# Patient Record
Sex: Male | Born: 1950
Health system: Southern US, Community
[De-identification: ages and names within clinical notes are randomized; demographics above are authoritative.]

## PROBLEM LIST (undated history)

## (undated) DIAGNOSIS — C801 Malignant (primary) neoplasm, unspecified: Secondary | ICD-10-CM

## (undated) DIAGNOSIS — M1990 Unspecified osteoarthritis, unspecified site: Secondary | ICD-10-CM

## (undated) DIAGNOSIS — M109 Gout, unspecified: Secondary | ICD-10-CM

## (undated) DIAGNOSIS — I10 Essential (primary) hypertension: Secondary | ICD-10-CM

## (undated) DIAGNOSIS — D126 Benign neoplasm of colon, unspecified: Secondary | ICD-10-CM

## (undated) DIAGNOSIS — K8020 Calculus of gallbladder without cholecystitis without obstruction: Secondary | ICD-10-CM

## (undated) DIAGNOSIS — I639 Cerebral infarction, unspecified: Secondary | ICD-10-CM

## (undated) DIAGNOSIS — K222 Esophageal obstruction: Secondary | ICD-10-CM

## (undated) DIAGNOSIS — B182 Chronic viral hepatitis C: Secondary | ICD-10-CM

## (undated) DIAGNOSIS — M25569 Pain in unspecified knee: Secondary | ICD-10-CM

## (undated) DIAGNOSIS — M25512 Pain in left shoulder: Secondary | ICD-10-CM

## (undated) DIAGNOSIS — G8929 Other chronic pain: Secondary | ICD-10-CM

## (undated) DIAGNOSIS — B1920 Unspecified viral hepatitis C without hepatic coma: Secondary | ICD-10-CM

## (undated) HISTORY — DX: Essential (primary) hypertension: I10

## (undated) HISTORY — DX: Other chronic pain: G89.29

## (undated) HISTORY — DX: Benign neoplasm of colon, unspecified: D12.6

## (undated) HISTORY — DX: Pain in left shoulder: M25.512

## (undated) HISTORY — DX: Esophageal obstruction: K22.2

## (undated) HISTORY — PX: OTHER SURGICAL HISTORY: SHX169

## (undated) HISTORY — DX: Unspecified osteoarthritis, unspecified site: M19.90

## (undated) HISTORY — DX: Gout, unspecified: M10.9

## (undated) HISTORY — DX: Malignant (primary) neoplasm, unspecified: C80.1

## (undated) HISTORY — DX: Chronic viral hepatitis C: B18.2

## (undated) HISTORY — DX: Calculus of gallbladder without cholecystitis without obstruction: K80.20

## (undated) HISTORY — DX: Cerebral infarction, unspecified: I63.9

## (undated) HISTORY — DX: Pain in unspecified knee: M25.569

## (undated) HISTORY — PX: LEG SURGERY: SHX1003

---

## 2002-09-16 ENCOUNTER — Encounter: Payer: Self-pay | Admitting: Emergency Medicine

## 2002-09-16 ENCOUNTER — Emergency Department (HOSPITAL_COMMUNITY): Admission: EM | Admit: 2002-09-16 | Discharge: 2002-09-16 | Payer: Self-pay | Admitting: Emergency Medicine

## 2003-08-01 ENCOUNTER — Emergency Department (HOSPITAL_COMMUNITY): Admission: EM | Admit: 2003-08-01 | Discharge: 2003-08-01 | Payer: Self-pay | Admitting: Family Medicine

## 2004-06-10 ENCOUNTER — Ambulatory Visit (HOSPITAL_COMMUNITY): Admission: RE | Admit: 2004-06-10 | Discharge: 2004-06-10 | Payer: Self-pay | Admitting: Internal Medicine

## 2009-02-22 LAB — CBC
ALT: 120 U/L — AB (ref 10–40)
Total Bilirubin: 0.4 mg/dL

## 2011-07-22 LAB — COMPREHENSIVE METABOLIC PANEL
AST: 41 U/L
Albumin: 3.6
Alkaline Phosphatase: 89 U/L
BUN: 13 mg/dL (ref 4–21)
Calcium: 9 mg/dL
Chloride: 104 mmol/L
Glucose: 79
PSA: 1.05
Potassium: 4.3 mmol/L
Sodium: 140 mmol/L (ref 137–147)
Total Protein: 7.3 g/dL

## 2011-07-22 LAB — CBC: Hemoglobin: 13.8 g/dL (ref 13.5–17.5)

## 2011-12-16 ENCOUNTER — Ambulatory Visit (INDEPENDENT_AMBULATORY_CARE_PROVIDER_SITE_OTHER): Payer: Medicare Other | Admitting: Urgent Care

## 2011-12-16 ENCOUNTER — Encounter: Payer: Self-pay | Admitting: Urgent Care

## 2011-12-16 VITALS — BP 158/90 | HR 80 | Temp 98.2°F | Ht 73.0 in | Wt 158.2 lb

## 2011-12-16 DIAGNOSIS — K759 Inflammatory liver disease, unspecified: Secondary | ICD-10-CM

## 2011-12-16 DIAGNOSIS — B1920 Unspecified viral hepatitis C without hepatic coma: Secondary | ICD-10-CM

## 2011-12-16 DIAGNOSIS — R630 Anorexia: Secondary | ICD-10-CM | POA: Insufficient documentation

## 2011-12-16 DIAGNOSIS — Z8619 Personal history of other infectious and parasitic diseases: Secondary | ICD-10-CM

## 2011-12-16 DIAGNOSIS — R7989 Other specified abnormal findings of blood chemistry: Secondary | ICD-10-CM

## 2011-12-16 LAB — HEPATIC FUNCTION PANEL
AST: 50 U/L — ABNORMAL HIGH (ref 0–37)
Albumin: 4.4 g/dL (ref 3.5–5.2)
Indirect Bilirubin: 0.5 mg/dL (ref 0.0–0.9)
Total Bilirubin: 0.7 mg/dL (ref 0.3–1.2)
Total Protein: 8.5 g/dL — ABNORMAL HIGH (ref 6.0–8.3)

## 2011-12-16 MED ORDER — PEG 3350-KCL-NA BICARB-NACL 420 G PO SOLR: 4000.0000 mL | ORAL | Status: DC

## 2011-12-16 NOTE — Patient Instructions (Addendum)
1-800-QUIT-NOW for help quitting smoking Colonoscopy & EGD (upper endoscopy) with Dr Jena Gauss You need an ultrasound of your liver Go get your labs today. We will call you with results.

## 2011-12-16 NOTE — Assessment & Plan Note (Addendum)
He has chronic unexplained anorexia & malaise.  No significant weight loss.  Never had a colonoscopy.  Offered colonoscopy & EGD with Dr Jena Gauss for further evaluation to look for PUD, h pylori or occult malignancy.  I have discussed risks & benefits which include, but are not limited to, bleeding, infection, perforation & drug reaction.  The patient agrees with this plan & written consent will be obtained.    1-800-QUIT-NOW for help quitting smoking

## 2011-12-16 NOTE — Assessment & Plan Note (Addendum)
Hx elevated AST/ALT with underlying hx hepatitis C.  Last LFTs 07/2011.  Frequent ETOH use.  Recheck LFTs Abdominal ultrasound

## 2011-12-16 NOTE — Progress Notes (Signed)
Referring Provider: No ref. provider found Primary Care Physician:  FAGAN,ROY, MD Primary Gastroenterologist:  Dr. Rourk  Chief Complaint  Patient presents with  . Hepatitis C  . Colonoscopy  . Anorexia    HPI:  Kevin Dillon is a 61 y.o. male here as a referral from Dr. Fagan for hepatitis C & screening colonoscopy.  He reports he was diagnosed with hepatitis C years ago, but never had treatment.  He gives hx of IV drug use (cocaine) 20 yrs ago.  He has 1 tattoo.  He has hx of multiple sexual partners.  No hx blood transfusion.  His father had cirrhosis & heavy etoh use.  He succumbed to it at age 46.  He has anorexia.  He reports early satiety & barely maintains his weight.  C/o malaise.   Drinks a couple beers 3-4 times per week.  Denies jaundice, rash, pruritis, or fevers.  Denies constipation, diarrhea, rectal bleeding, melena or weight loss.  Denies heartburn, indigestion, nausea, vomiting, dysphagia, or odynophagia.  C/o intermittent tingling & paresthesias in both arms while lying supine.  He has never had a screening colonoscopy or EGD.    Review of his old labs shows most recent LFTs (07/22/11) show an AST of 41.  From 2011-2012, AST has been as high as 136 & ALT 120 with otherwise normal LFTs.  07/22/11 CBC & CMP were otherwise unremarkable.  He has not had recent imaging or his liver.    Past Medical History  Diagnosis Date  . Osteoarthritis   . CVA (cerebral infarction)   . Hypertension   . Chronic hepatitis C   . Chronic left shoulder pain   . Chronic knee pain   . Gout     Past Surgical History  Procedure Date  . Leg surgery     left  . Arm surgery     right/plate in arm    Current Outpatient Prescriptions  Medication Sig Dispense Refill  . atenolol (TENORMIN) 50 MG tablet Take 25 mg by mouth daily.       . cyproheptadine (PERIACTIN) 4 MG tablet Take 4 mg by mouth every 6 (six) hours.       . oxyCODONE (ROXICODONE) 15 MG immediate release tablet Take 15 mg by mouth  every 6 (six) hours as needed.         Allergies as of 12/16/2011  . (No Known Allergies)    Family History:There is no known family history of colorectal carcinoma  Problem Relation Age of Onset  . Cirrhosis Father 46  . Lung cancer Mother 76    History   Social History  . Marital Status: Single    Spouse Name: N/A    Number of Children: 0  . Years of Education: N/A   Occupational History  . disabled    Social History Main Topics  . Smoking status: Current Every Day Smoker -- 0.5 packs/day for 40 years    Types: Cigarettes  . Smokeless tobacco: Not on file  . Alcohol Use: Yes     Comment: 2 beers 3-4 days per week  . Drug Use: Yes     Comment: Hx cocaine yrs ago, Last marijuana 2-3 months ago  . Sexually Active: Not Currently -- Male partner(s)   Other Topics Concern  . Not on file   Social History Narrative   Lives w/ significant other, Patricia Heath    Review of Systems: Gen: see HPI CV: Denies chest pain, angina, palpitations, syncope, orthopnea, PND, peripheral   edema, and claudication. Resp: Denies dyspnea at rest, dyspnea with exercise, cough, sputum, wheezing, coughing up blood, and pleurisy. GI: Denies vomiting blood and fecal incontinence.  GU : Denies urinary burning, blood in urine, urinary frequency, urinary hesitancy, nocturnal urination, and urinary incontinence. MS: Denies joint pain, limitation of movement, and swelling, stiffness, low back pain, extremity pain. Denies muscle weakness, cramps, atrophy.  Derm: Denies rash, itching, dry skin, hives, moles, warts, or unhealing ulcers.  Psych: Denies depression, anxiety, memory loss, suicidal ideation, hallucinations, paranoia, and confusion. Heme: Denies bruising, bleeding, and enlarged lymph nodes. Neuro:  Denies any headaches, dizziness, paresthesias. Endo:  Denies any problems with DM, thyroid, adrenal function.  Physical Exam: BP 158/90  Pulse 80  Temp 98.2 F (36.8 C) (Temporal)  Ht 6'  1" (1.854 m)  Wt 158 lb 3.2 oz (71.759 kg)  BMI 20.87 kg/m2 No LMP for male patient. General:   Alert,  Well-developed,thin, pleasant and cooperative in NAD.  Accompanied by his significant other. Head:  Normocephalic and atraumatic. Eyes:  Sclera clear, no icterus.   Conjunctiva pink. Ears:  Normal auditory acuity. Nose:  No deformity, discharge, or lesions. Mouth:  No deformity or lesions,oropharynx pink & moist. Neck:  Supple; no masses or thyromegaly. Lungs:  Clear throughout to auscultation.   No wheezes, crackles, or rhonchi. No acute distress. Heart:  Regular rate and rhythm; no murmurs, clicks, rubs,  or gallops. Abdomen:  Normal bowel sounds.  No bruits.  Soft, non-tender and non-distended without masses, hepatosplenomegaly or hernias noted.  No guarding or rebound tenderness.   Rectal:  Deferred. Msk:  Symmetrical without gross deformities. Normal posture. Pulses:  Normal pulses noted. Extremities:  No edema. Neurologic:  Alert and oriented x4;  grossly normal neurologically. Skin:  Intact without significant lesions or rashes. Lymph Nodes:  No significant cervical adenopathy. Psych:  Alert and cooperative. Normal mood and affect.;  

## 2011-12-16 NOTE — Progress Notes (Signed)
Faxed to PCP

## 2011-12-16 NOTE — Assessment & Plan Note (Addendum)
Kevin Dillon is a pleasant 61 y.o. male referred here for Hepatitis C.  He reports diagnosis years ago, but never had treatment.  Hx IV drug use & multiple sexual partners are known risk factors.  Ongoing alcohol & marijuana use may interfere with treatment candidacy.    He is advised to avoid ETOH & all street drugs Hepatitis C quant RNA by PCR, genotype If active viremia, will refer to Hepatitis C clinic

## 2011-12-18 LAB — HEPATITIS C RNA QUANTITATIVE: HCV Quantitative: 57944 IU/mL — ABNORMAL HIGH (ref <15)

## 2011-12-20 DIAGNOSIS — D126 Benign neoplasm of colon, unspecified: Secondary | ICD-10-CM

## 2011-12-20 HISTORY — DX: Benign neoplasm of colon, unspecified: D12.6

## 2011-12-21 ENCOUNTER — Ambulatory Visit (HOSPITAL_COMMUNITY)
Admission: RE | Admit: 2011-12-21 | Discharge: 2011-12-21 | Disposition: A | Discharge status: Home / Self Care | Payer: Medicare Other | Source: Ambulatory Visit | Attending: Urgent Care | Admitting: Urgent Care

## 2011-12-21 DIAGNOSIS — K8020 Calculus of gallbladder without cholecystitis without obstruction: Secondary | ICD-10-CM | POA: Insufficient documentation

## 2011-12-21 DIAGNOSIS — K759 Inflammatory liver disease, unspecified: Secondary | ICD-10-CM

## 2011-12-21 DIAGNOSIS — R7989 Other specified abnormal findings of blood chemistry: Secondary | ICD-10-CM | POA: Insufficient documentation

## 2011-12-21 DIAGNOSIS — B1920 Unspecified viral hepatitis C without hepatic coma: Secondary | ICD-10-CM | POA: Insufficient documentation

## 2011-12-21 DIAGNOSIS — I10 Essential (primary) hypertension: Secondary | ICD-10-CM | POA: Insufficient documentation

## 2011-12-21 NOTE — Progress Notes (Signed)
Quick Note:  See labs ZO:XWRUE,AVW, MD  ______

## 2011-12-21 NOTE — Progress Notes (Signed)
Faxed to PCP

## 2011-12-21 NOTE — Progress Notes (Signed)
Quick Note:  Attempted to call pt-no answer. ZO:XWRUE,AVW, MD  ______

## 2011-12-22 ENCOUNTER — Encounter (HOSPITAL_COMMUNITY): Payer: Self-pay | Admitting: Pharmacy Technician

## 2011-12-24 NOTE — Progress Notes (Signed)
Quick Note:  Discussed lab results & ultrasound w/ pt. Pt's gallstones asymptomatic. Hepatitis C safe sex precautions discussed. Needs referral to Hepatitis C clinic in [place]. Please mail Hepatitis C literature to pt. Pt verbalizes understanding & agrees w/ plan.  ______

## 2011-12-24 NOTE — Progress Notes (Signed)
Quick Note:  Left message with Dennie Bible for return call.  ______

## 2011-12-24 NOTE — Progress Notes (Signed)
Faxed Referral to Hep C clinic and they will contact patient with date & time

## 2011-12-28 ENCOUNTER — Telehealth: Payer: Self-pay | Admitting: Urgent Care

## 2011-12-28 NOTE — Telephone Encounter (Signed)
Opened in error

## 2011-12-29 MED ORDER — SODIUM CHLORIDE 0.45 % IV SOLN
INTRAVENOUS | Status: DC
  Administered 2011-12-30: 10:00:00 via INTRAVENOUS

## 2011-12-30 ENCOUNTER — Other Ambulatory Visit: Payer: Self-pay | Admitting: Internal Medicine

## 2011-12-30 ENCOUNTER — Encounter (HOSPITAL_COMMUNITY)
Admission: RE | Disposition: A | Discharge status: Home / Self Care | Payer: Self-pay | Source: Ambulatory Visit | Attending: Internal Medicine

## 2011-12-30 ENCOUNTER — Encounter (HOSPITAL_COMMUNITY): Payer: Self-pay | Admitting: *Deleted

## 2011-12-30 ENCOUNTER — Ambulatory Visit (HOSPITAL_COMMUNITY)
Admission: RE | Admit: 2011-12-30 | Discharge: 2011-12-30 | Disposition: A | Discharge status: Home / Self Care | Payer: Medicare Other | Source: Ambulatory Visit | Attending: Internal Medicine | Admitting: Internal Medicine

## 2011-12-30 DIAGNOSIS — K449 Diaphragmatic hernia without obstruction or gangrene: Secondary | ICD-10-CM | POA: Insufficient documentation

## 2011-12-30 DIAGNOSIS — R630 Anorexia: Secondary | ICD-10-CM

## 2011-12-30 DIAGNOSIS — D126 Benign neoplasm of colon, unspecified: Secondary | ICD-10-CM

## 2011-12-30 DIAGNOSIS — B182 Chronic viral hepatitis C: Secondary | ICD-10-CM | POA: Insufficient documentation

## 2011-12-30 DIAGNOSIS — R634 Abnormal weight loss: Secondary | ICD-10-CM | POA: Insufficient documentation

## 2011-12-30 DIAGNOSIS — I10 Essential (primary) hypertension: Secondary | ICD-10-CM | POA: Insufficient documentation

## 2011-12-30 DIAGNOSIS — B1920 Unspecified viral hepatitis C without hepatic coma: Secondary | ICD-10-CM

## 2011-12-30 DIAGNOSIS — K222 Esophageal obstruction: Secondary | ICD-10-CM

## 2011-12-30 DIAGNOSIS — Z1211 Encounter for screening for malignant neoplasm of colon: Secondary | ICD-10-CM

## 2011-12-30 DIAGNOSIS — Z8619 Personal history of other infectious and parasitic diseases: Secondary | ICD-10-CM

## 2011-12-30 HISTORY — DX: Unspecified viral hepatitis C without hepatic coma: B19.20

## 2011-12-30 HISTORY — PX: COLONOSCOPY WITH ESOPHAGOGASTRODUODENOSCOPY (EGD): SHX5779

## 2011-12-30 SURGERY — COLONOSCOPY WITH ESOPHAGOGASTRODUODENOSCOPY (EGD)
Anesthesia: Moderate Sedation

## 2011-12-30 MED ORDER — MEPERIDINE HCL 100 MG/ML IJ SOLN
INTRAMUSCULAR | Status: AC
  Filled 2011-12-30: qty 1

## 2011-12-30 MED ORDER — MIDAZOLAM HCL 5 MG/5ML IJ SOLN
INTRAMUSCULAR | Status: DC | PRN
  Administered 2011-12-30: 1 mg via INTRAVENOUS
  Administered 2011-12-30: 2 mg via INTRAVENOUS
  Administered 2011-12-30 (×4): 1 mg via INTRAVENOUS
  Administered 2011-12-30: 2 mg via INTRAVENOUS

## 2011-12-30 MED ORDER — MIDAZOLAM HCL 5 MG/5ML IJ SOLN
INTRAMUSCULAR | Status: AC
  Filled 2011-12-30: qty 10

## 2011-12-30 MED ORDER — MEPERIDINE HCL 100 MG/ML IJ SOLN
INTRAMUSCULAR | Status: DC | PRN
  Administered 2011-12-30 (×2): 25 mg via INTRAVENOUS
  Administered 2011-12-30: 50 mg via INTRAVENOUS
  Administered 2011-12-30: 50 mg
  Administered 2011-12-30 (×2): 25 mg via INTRAVENOUS

## 2011-12-30 NOTE — H&P (View-Only) (Signed)
Referring Provider: No ref. provider found Primary Care Physician:  Carylon Perches, MD Primary Gastroenterologist:  Dr. Jena Gauss  Chief Complaint  Patient presents with  . Hepatitis C  . Colonoscopy  . Anorexia    HPI:  Kevin Dillon is a 61 y.o. male here as a referral from Dr. Ouida Sills for hepatitis C & screening colonoscopy.  He reports he was diagnosed with hepatitis C years ago, but never had treatment.  He gives hx of IV drug use (cocaine) 20 yrs ago.  He has 1 tattoo.  He has hx of multiple sexual partners.  No hx blood transfusion.  His father had cirrhosis & heavy etoh use.  He succumbed to it at age 69.  He has anorexia.  He reports early satiety & barely maintains his weight.  C/o malaise.   Drinks a couple beers 3-4 times per week.  Denies jaundice, rash, pruritis, or fevers.  Denies constipation, diarrhea, rectal bleeding, melena or weight loss.  Denies heartburn, indigestion, nausea, vomiting, dysphagia, or odynophagia.  C/o intermittent tingling & paresthesias in both arms while lying supine.  He has never had a screening colonoscopy or EGD.    Review of his old labs shows most recent LFTs (07/22/11) show an AST of 41.  From 2011-2012, AST has been as high as 136 & ALT 120 with otherwise normal LFTs.  07/22/11 CBC & CMP were otherwise unremarkable.  He has not had recent imaging or his liver.    Past Medical History  Diagnosis Date  . Osteoarthritis   . CVA (cerebral infarction)   . Hypertension   . Chronic hepatitis C   . Chronic left shoulder pain   . Chronic knee pain   . Gout     Past Surgical History  Procedure Date  . Leg surgery     left  . Arm surgery     right/plate in arm    Current Outpatient Prescriptions  Medication Sig Dispense Refill  . atenolol (TENORMIN) 50 MG tablet Take 25 mg by mouth daily.       . cyproheptadine (PERIACTIN) 4 MG tablet Take 4 mg by mouth every 6 (six) hours.       Marland Kitchen oxyCODONE (ROXICODONE) 15 MG immediate release tablet Take 15 mg by mouth  every 6 (six) hours as needed.         Allergies as of 12/16/2011  . (No Known Allergies)    Family History:There is no known family history of colorectal carcinoma  Problem Relation Age of Onset  . Cirrhosis Father 49  . Lung cancer Mother 57    History   Social History  . Marital Status: Single    Spouse Name: N/A    Number of Children: 0  . Years of Education: N/A   Occupational History  . disabled    Social History Main Topics  . Smoking status: Current Every Day Smoker -- 0.5 packs/day for 40 years    Types: Cigarettes  . Smokeless tobacco: Not on file  . Alcohol Use: Yes     Comment: 2 beers 3-4 days per week  . Drug Use: Yes     Comment: Hx cocaine yrs ago, Last marijuana 2-3 months ago  . Sexually Active: Not Currently -- Male partner(s)   Other Topics Concern  . Not on file   Social History Narrative   Lives w/ significant other, Kenna Gilbert    Review of Systems: Gen: see HPI CV: Denies chest pain, angina, palpitations, syncope, orthopnea, PND, peripheral  edema, and claudication. Resp: Denies dyspnea at rest, dyspnea with exercise, cough, sputum, wheezing, coughing up blood, and pleurisy. GI: Denies vomiting blood and fecal incontinence.  GU : Denies urinary burning, blood in urine, urinary frequency, urinary hesitancy, nocturnal urination, and urinary incontinence. MS: Denies joint pain, limitation of movement, and swelling, stiffness, low back pain, extremity pain. Denies muscle weakness, cramps, atrophy.  Derm: Denies rash, itching, dry skin, hives, moles, warts, or unhealing ulcers.  Psych: Denies depression, anxiety, memory loss, suicidal ideation, hallucinations, paranoia, and confusion. Heme: Denies bruising, bleeding, and enlarged lymph nodes. Neuro:  Denies any headaches, dizziness, paresthesias. Endo:  Denies any problems with DM, thyroid, adrenal function.  Physical Exam: BP 158/90  Pulse 80  Temp 98.2 F (36.8 C) (Temporal)  Ht 6'  1" (1.854 m)  Wt 158 lb 3.2 oz (71.759 kg)  BMI 20.87 kg/m2 No LMP for male patient. General:   Alert,  Well-developed,thin, pleasant and cooperative in NAD.  Accompanied by his significant other. Head:  Normocephalic and atraumatic. Eyes:  Sclera clear, no icterus.   Conjunctiva pink. Ears:  Normal auditory acuity. Nose:  No deformity, discharge, or lesions. Mouth:  No deformity or lesions,oropharynx pink & moist. Neck:  Supple; no masses or thyromegaly. Lungs:  Clear throughout to auscultation.   No wheezes, crackles, or rhonchi. No acute distress. Heart:  Regular rate and rhythm; no murmurs, clicks, rubs,  or gallops. Abdomen:  Normal bowel sounds.  No bruits.  Soft, non-tender and non-distended without masses, hepatosplenomegaly or hernias noted.  No guarding or rebound tenderness.   Rectal:  Deferred. Msk:  Symmetrical without gross deformities. Normal posture. Pulses:  Normal pulses noted. Extremities:  No edema. Neurologic:  Alert and oriented x4;  grossly normal neurologically. Skin:  Intact without significant lesions or rashes. Lymph Nodes:  No significant cervical adenopathy. Psych:  Alert and cooperative. Normal mood and affect.;

## 2011-12-30 NOTE — Op Note (Signed)
[facility] ([place]) [address] [place], [address]   COLONOSCOPY PROCEDURE REPORT  PATIENT: Kevin Dillon, Kevin Dillon  MR#:         [id number] BIRTHDATE: 1950-04-12 , 61  yrs. old GENDER: Male ENDOSCOPIST: R.  Roetta Sessions, MD FACP FACG REFERRED BY:  Carylon Perches, M.D. PROCEDURE DATE:  12/30/2011 PROCEDURE:     Colonoscopy with snare polypectomy  INDICATIONS: Colorectal cancer screening.  INFORMED CONSENT:  The risks, benefits, alternatives and imponderables including but not limited to bleeding, perforation as well as the possibility of a missed lesion have been reviewed.  The potential for biopsy, lesion removal, etc. have also been discussed.  Questions have been answered.  All parties agreeable. Please see the history and physical in the medical record for more information.  MEDICATIONS: Versed 9 mg IV and Demerol 200 mg IV in divided doses.  DESCRIPTION OF PROCEDURE:  After a digital rectal exam was performed, the EG-2990i ([id number]) and Pentax Colonoscope [id number] colonoscope was advanced from the anus through the rectum and colon to the area of the cecum, ileocecal valve and appendiceal orifice. The cecum was deeply intubated.  These structures were well-seen and photographed for the record.  From the level of the cecum and ileocecal valve, the scope was slowly and cautiously withdrawn. The mucosal surfaces were carefully surveyed utilizing scope tip deflection to facilitate fold flattening as needed.  The scope was pulled down into the rectum where a thorough examination including retroflexion was performed.    FINDINGS:  Adequate preparation. If Normal rectum. (1) 2 x 8 mm polyp at the splenic flexure; otherwise, the remainder of colonic as per normal.  THERAPEUTIC / DIAGNOSTIC MANEUVERS PERFORMED:  The above-mentioned polyps  was hot snare removed and recovered for the pathologist  COMPLICATIONS: None  CECAL WITHDRAWAL TIME:  7 minutes  IMPRESSION:  Colonic  polyp-removed described above  RECOMMENDATIONS: See EGD report. Followup on pathology. HIDA with fatty meal challenge to further evaluate the gallbladder.  _______________________________ eSigned:  R. Roetta Sessions, MD FACP [facility] 12/30/2011 12:53 PM   CC:

## 2011-12-30 NOTE — Op Note (Signed)
[facility] [address] [place], [address]   ENDOSCOPY PROCEDURE REPORT  PATIENT: Kevin Dillon, Kevin Dillon  MR#: [id number] BIRTHDATE: 28-Nov-1950 , 61  yrs. old GENDER: Male ENDOSCOPIST: R.  Roetta Sessions, MD FACP FACG REFERRED BY:  Carylon Perches, M.D. PROCEDURE DATE:  12/30/2011 PROCEDURE:     Diagnostic EGD  INDICATIONS:     Anorexia; weight loss; history of chronic hepatitis C infection  INFORMED CONSENT:   The risks, benefits, limitations, alternatives and imponderables have been discussed.  The potential for biopsy, esophogeal dilation, etc. have also been reviewed.  Questions have been answered.  All parties agreeable.  Please see the history and physical in the medical record for more information.  MEDICATIONS:   Demerol 200 mg IV and Versed 8 mg IV in divided doses. Cetacaine spray.  DESCRIPTION OF PROCEDURE:   The EG-2990i ([id number])  endoscope was introduced through the mouth and advanced to the second portion of the duodenum without difficulty or limitations.  The mucosal surfaces were surveyed very carefully during advancement of the scope and upon withdrawal.  Retroflexion view of the proximal stomach and esophagogastric junction was performed.      FINDINGS: Noncritical Schatzki's ring; otherwise, normal-appearing esophageal mucosa. No esophageal varices. Stomach empty. Small hiatal hernia; otherwise, the remainder of the gastric mucosa appeared normal. Patent pylorus. Normal first and second portion of the duodenum.  THERAPEUTIC / DIAGNOSTIC MANEUVERS PERFORMED:  None   COMPLICATIONS:  None  IMPRESSION:  Noncritical Schatzki's ring;  Hiatal hernia  RECOMMENDATIONS:  See colonoscopy report.    _______________________________ R. Roetta Sessions, MD FACP [facility] eSigned:  R. Roetta Sessions, MD FACP [facility] 12/30/2011 12:21 PM     CC:

## 2011-12-30 NOTE — Interval H&P Note (Signed)
History and Physical Interval Note:  12/30/2011 11:44 AM  Kevin Dillon  has presented today for surgery, with the diagnosis of HISTORY OF HCV, ANOREXIA, ABNORMAL LFTS  The various methods of treatment have been discussed with the patient and family. After consideration of risks, benefits and other options for treatment, the patient has consented to  Procedure(s) (LRB) with comments: COLONOSCOPY WITH ESOPHAGOGASTRODUODENOSCOPY (EGD) (N/A) - 11:00 as a surgical intervention .  The patient's history has been reviewed, patient examined, no change in status, stable for surgery.  I have reviewed the patient's chart and labs.  Questions were answered to the patient's satisfaction.     Kevin Dillon  Ultrasound shows cholelithiasis a slightly thickened gallbladder wall. Liver appeared normal. EGD and colonoscopy today per plan. Patient has chronic HCV. The risks, benefits, limitations, imponderables and alternatives regarding both EGD and colonoscopy have been reviewed with the patient. Questions have been answered. All parties agreeable.

## 2011-12-31 ENCOUNTER — Encounter: Payer: Self-pay | Admitting: Internal Medicine

## 2011-12-31 ENCOUNTER — Other Ambulatory Visit: Payer: Self-pay | Admitting: Internal Medicine

## 2011-12-31 ENCOUNTER — Encounter: Payer: Self-pay | Admitting: *Deleted

## 2011-12-31 ENCOUNTER — Telehealth: Payer: Self-pay | Admitting: Internal Medicine

## 2011-12-31 DIAGNOSIS — K829 Disease of gallbladder, unspecified: Secondary | ICD-10-CM

## 2011-12-31 NOTE — Telephone Encounter (Signed)
HIDA Scan is scheduled on Monday Dec 16th at 10:00 and patient is aware

## 2012-01-04 ENCOUNTER — Encounter (HOSPITAL_COMMUNITY)
Admission: RE | Admit: 2012-01-04 | Discharge: 2012-01-04 | Disposition: A | Discharge status: Home / Self Care | Payer: Medicare Other | Source: Ambulatory Visit | Attending: Internal Medicine | Admitting: Internal Medicine

## 2012-01-04 ENCOUNTER — Encounter (HOSPITAL_COMMUNITY): Payer: Self-pay | Admitting: Internal Medicine

## 2012-01-04 DIAGNOSIS — K829 Disease of gallbladder, unspecified: Secondary | ICD-10-CM

## 2012-01-05 ENCOUNTER — Encounter (HOSPITAL_COMMUNITY): Payer: Medicare Other

## 2012-01-08 ENCOUNTER — Encounter (HOSPITAL_COMMUNITY)
Admission: RE | Admit: 2012-01-08 | Discharge: 2012-01-08 | Disposition: A | Discharge status: Home / Self Care | Payer: Medicare Other | Source: Ambulatory Visit | Attending: Internal Medicine | Admitting: Internal Medicine

## 2012-01-08 ENCOUNTER — Encounter (HOSPITAL_COMMUNITY): Payer: Self-pay

## 2012-01-08 DIAGNOSIS — K828 Other specified diseases of gallbladder: Secondary | ICD-10-CM | POA: Insufficient documentation

## 2012-01-08 DIAGNOSIS — K8020 Calculus of gallbladder without cholecystitis without obstruction: Secondary | ICD-10-CM | POA: Insufficient documentation

## 2012-01-08 MED ORDER — TECHNETIUM TC 99M MEBROFENIN IV KIT
5.0000 | PACK | Freq: Once | INTRAVENOUS | Status: AC | PRN
  Administered 2012-01-08: 5 via INTRAVENOUS

## 2012-01-15 ENCOUNTER — Telehealth: Payer: Self-pay | Admitting: Internal Medicine

## 2012-01-15 NOTE — Telephone Encounter (Signed)
Pt's wife called this morning asking if the results were back from 12/20 and when he needed to FU with RMR again, Also, asked about being referred to a clinic for Hepatitis. Please call patient at [phone] or [phone]

## 2012-01-18 NOTE — Telephone Encounter (Signed)
Office visit with [place] in about 6-8 weeks but after he has been seen at the hepatitis C clinic and we have received feedback from those providers.

## 2012-01-18 NOTE — Telephone Encounter (Signed)
Gallbladder function normal based on a HIDA results. Agree with proceeding onto hepatitis C clinic

## 2012-01-18 NOTE — Telephone Encounter (Signed)
Pt aware, he wants to know when does he need a follow up visit with [place]. Please advise.

## 2012-01-18 NOTE — Telephone Encounter (Signed)
Routing to RMR for [employer].   Kevin Dillon, was pt referred to Hep c clinic?

## 2012-01-18 NOTE — Telephone Encounter (Signed)
Referral has been sent to the Hep C Clinic and they will contact the patient with date & time

## 2012-01-19 NOTE — Telephone Encounter (Signed)
Scheduled to come in Feb 11, 9 am.

## 2012-01-19 NOTE — Telephone Encounter (Signed)
Pt aware, he will call back after he gets his appt at the hep c clinic.

## 2012-02-25 ENCOUNTER — Encounter: Payer: Self-pay | Admitting: Internal Medicine

## 2012-03-01 ENCOUNTER — Encounter: Payer: Self-pay | Admitting: Urgent Care

## 2012-03-01 ENCOUNTER — Ambulatory Visit (INDEPENDENT_AMBULATORY_CARE_PROVIDER_SITE_OTHER): Payer: Medicare Other | Admitting: Urgent Care

## 2012-03-01 VITALS — BP 145/76 | HR 86 | Temp 98.2°F | Ht 73.0 in | Wt 163.0 lb

## 2012-03-01 DIAGNOSIS — Z8619 Personal history of other infectious and parasitic diseases: Secondary | ICD-10-CM

## 2012-03-01 DIAGNOSIS — D126 Benign neoplasm of colon, unspecified: Secondary | ICD-10-CM

## 2012-03-01 DIAGNOSIS — K5900 Constipation, unspecified: Secondary | ICD-10-CM | POA: Insufficient documentation

## 2012-03-01 MED ORDER — DOCUSATE SODIUM 100 MG PO CAPS: 100.0000 mg | ORAL_CAPSULE | Freq: Two times a day (BID) | ORAL | Status: DC | PRN

## 2012-03-01 MED ORDER — POLYETHYLENE GLYCOL 3350 17 GM/SCOOP PO POWD: 17.0000 g | Freq: Every day | ORAL | Status: DC | PRN

## 2012-03-01 NOTE — Patient Instructions (Addendum)
Begin MiRALAX 17 grams nightly as directed as needed for constipation Colace stool softeners once or twice daily for hard stools as needed Keep appt with Hepatitis C clinic Call if any further wt loss or problems Office visit here in 6 months Next colonoscopy DEC 2018

## 2012-03-01 NOTE — Progress Notes (Signed)
Referring Provider: Carylon Perches, MD Primary Care Physician:  Carylon Perches, MD Primary Gastroenterologist:  Dr.   Chief Complaint  Patient presents with  . Follow-up   HPI:  Kevin Dillon is a pleasant 62 y.o. male with hx anorexia, early satiety, and chronic hepatitis C, genotype 1b.  He had a recent colonoscopy & was found to have a single tubular adenoma removed from splenic flexure.  Non-critical Schatzki's ring seen on recent EGD along with small hiatal hernia.  He was referred to [facility]atitis [facility] in [place], but has not heard back yet.  He gives hx of remote IV drug use (cocaine) 20 yrs ago.  He quit smoking marijuana Oct 2013.  He quit drinking ETOH & smoking in Dec 2013.  Appetite is much better over past 2-1/2 weeks.  He has started gaining weight.   Denies heartburn, indigestion, nausea, vomiting, dysphagia, odynophagia or anorexia.  Denies diarrhea, rectal bleeding, melena or weight loss. Denies abdominal pain. He does have occasional constipation & is asking what he can take for this.  He has mildly elevated ALP & AST as below.  Asymptomatic cholelithiasis at this point.  Recent GI work-up: 01/08/12 HIDA-Patency of cystic and common bile ducts. 2. Normal gallbladder ejection fraction. 11/2011 ABD US-Contracted gallbladder containing multiple stones. No sonographic Murphy's sign. Normal-appearing kidneys. Component     Latest Ref Rng 02/22/2009 07/22/2011 12/16/2011  Total Bilirubin     0.3 - 1.2 mg/dL   0.7  Bilirubin, Direct     0.0 - 0.3 mg/dL   0.2  Indirect Bilirubin     0.0 - 0.9 mg/dL   0.5  Alkaline Phosphatase     39 - 117 U/L   130 (H)  AST     0 - 37 U/L   50 (H)  ALT     0 - 53 U/L 120 (A) 29 44  Total Protein     6.0 - 8.3 g/dL   8.5 (H)  Albumin     3.5 - 5.2 g/dL   4.4  Total Bilirubin      0.4 0.5   Alkaline Phosphatase      95 89   AST      136 41   Total Protein       7.3   Albumin       3.6    Past Medical History  Diagnosis Date  . Osteoarthritis    . CVA (cerebral infarction)   . Hypertension   . Chronic hepatitis C   . Chronic left shoulder pain   . Chronic knee pain   . Gout   . Hepatitis C   . Tubular adenoma of colon 12/2011  . Cholelithiasis   . Schatzki's ring     Past Surgical History  Procedure Laterality Date  . Leg surgery      left  . Arm surgery      right/plate in arm  . Colonoscopy with esophagogastroduodenoscopy (egd)  12/30/2011    RMR: Noncritical Schatzki's ring;  Hiatal hernia, Tubular ADENOMA removed from splenic flexure, otherwise normal colonoscopy    Current Outpatient Prescriptions  Medication Sig Dispense Refill  . atenolol (TENORMIN) 50 MG tablet Take 25 mg by mouth daily.       . cyproheptadine (PERIACTIN) 4 MG tablet Take 4 mg by mouth every 6 (six) hours.       Marland Kitchen oxyCODONE (ROXICODONE) 15 MG immediate release tablet Take 15 mg by mouth every 6 (six) hours as needed. Pain      .  docusate sodium (COLACE) 100 MG capsule Take 1 capsule (100 mg total) by mouth 2 (two) times daily as needed for constipation.  60 capsule  5  . polyethylene glycol powder (MIRALAX) powder Take 17 g by mouth daily as needed.  527 g  11  . polyethylene glycol-electrolytes (TRILYTE) 420 G solution Take 4,000 mLs by mouth as directed.  4000 mL  0   No current facility-administered medications for this visit.    Allergies as of 03/01/2012  . (No Known Allergies)    Review of Systems: Gen: Denies any fever, chills, sweats, anorexia, fatigue, weakness, malaise, weight loss, and sleep disorder CV: Denies chest pain, angina, palpitations, syncope, orthopnea, PND, peripheral edema, and claudication. Resp: Denies dyspnea at rest, dyspnea with exercise, cough, sputum, wheezing, coughing up blood, and pleurisy. GI: Denies vomiting blood, jaundice, and fecal incontinence.  Derm: Denies rash, itching, dry skin, hives, moles, warts, or unhealing ulcers.  Psych: Denies depression, anxiety, memory loss, suicidal ideation,  hallucinations, paranoia, and confusion. Heme: Denies bruising, bleeding, and enlarged lymph nodes.  Physical Exam: BP 145/76  Pulse 86  Temp(Src) 98.2 F (36.8 C) (Oral)  Ht 6\' 1"  (1.854 m)  Wt 163 lb (73.936 kg)  BMI 21.51 kg/m2 General:   Alert,  Well-developed, well-nourished, pleasant and cooperative in NAD.  Accompanied by his wife. Eyes:  Sclera clear, no icterus.   Conjunctiva pink. Mouth:  No deformity or lesions, oropharynx pink and moist. Neck:  Supple; no masses or thyromegaly. Heart:  Regular rate and rhythm; no murmurs, clicks, rubs,  or gallops. Abdomen:  Normal bowel sounds.  No bruits.  Soft, non-tender and non-distended without masses, hepatosplenomegaly or hernias noted.  No guarding or rebound tenderness.   Rectal:  Deferred.  Msk:  Symmetrical without gross deformities.  Pulses:  Normal pulses noted. Extremities:  No clubbing or edema. Neurologic:  Alert and oriented x4;  grossly normal neurologically. Skin:  Intact without significant lesions or rashes.

## 2012-03-02 ENCOUNTER — Encounter: Payer: Self-pay | Admitting: Urgent Care

## 2012-03-02 NOTE — Progress Notes (Signed)
Faxed to PCP

## 2012-03-02 NOTE — Assessment & Plan Note (Signed)
Chronic Hepatitis C, genotype 1b He is working with Carylon Perches, MD to get Hep A/B vaccines We will contact Hepatitis Clinic in [place] to check on upcoming appt & be sure they have all his records He was commended on his ETOH, illicit drug & tobacco cessation. Office visit in 6 months

## 2012-03-02 NOTE — Assessment & Plan Note (Signed)
New -onset constipation.  Recent colonoscopy UTD.    Begin MIRALAX 17 grams nightly as directed as needed for constipation Colace stool softeners once or twice daily for hard stools as needed Call if any further wt loss or problems

## 2012-08-16 ENCOUNTER — Ambulatory Visit (INDEPENDENT_AMBULATORY_CARE_PROVIDER_SITE_OTHER): Payer: Medicare Other | Admitting: Internal Medicine

## 2012-08-16 ENCOUNTER — Encounter: Payer: Self-pay | Admitting: Internal Medicine

## 2012-08-16 VITALS — BP 135/80 | HR 72 | Temp 98.0°F | Ht 73.0 in | Wt 156.6 lb

## 2012-08-16 DIAGNOSIS — R768 Other specified abnormal immunological findings in serum: Secondary | ICD-10-CM

## 2012-08-16 DIAGNOSIS — R894 Abnormal immunological findings in specimens from other organs, systems and tissues: Secondary | ICD-10-CM

## 2012-08-16 DIAGNOSIS — Z860100 Personal history of colon polyps, unspecified: Secondary | ICD-10-CM

## 2012-08-16 NOTE — Progress Notes (Signed)
Primary Care Physician:  Carylon Perches, MD Primary Gastroenterologist:  Dr. Jena Gauss  Pre-Procedure History & Physical: HPI:  Kevin Dillon is a 62 y.o. male here for followup of chronic hepatitis C. History of colonic adenoma. Since last being seen in our office, he was seen down at the [facility] clinic in [place]. It sounds as though he may be offered a non-interferon-based regimen to eradicate HCV once it becomes available later in the year. Apparently had fibrosure to assess for fibrosis  - pt unaware of test results right now. Last ultrasound was in December of last year-liver appeared pretty much normal. He does have cholelithiasis. He is getting a vaccination against hepatitis A and B. through Dr. Alonza Smoker office. He'll be due for surveillance colonoscopy in 2018. He is doing well he has no specific GI complaints at this time.  Past Medical History  Diagnosis Date  . Osteoarthritis   . CVA (cerebral infarction)   . Hypertension   . Chronic hepatitis C   . Chronic left shoulder pain   . Chronic knee pain   . Gout   . Hepatitis C     genotype 1b.  pt has been vaccinated against hep A and B.  . Tubular adenoma of colon 12/2011  . Cholelithiasis   . Schatzki's ring     Past Surgical History  Procedure Laterality Date  . Leg surgery      left  . Arm surgery      right/plate in arm  . Colonoscopy with esophagogastroduodenoscopy (egd)  12/30/2011    RMR: Noncritical Schatzki's ring;  Hiatal hernia, Tubular ADENOMA removed from splenic flexure, otherwise normal colonoscopy    Prior to Admission medications   Medication Sig Start Date End Date Taking? Authorizing Provider  atenolol (TENORMIN) 50 MG tablet Take 25 mg by mouth daily.  11/04/11  Yes Historical Provider, MD  cyproheptadine (PERIACTIN) 4 MG tablet Take 4 mg by mouth every 6 (six) hours.  10/17/11  Yes Historical Provider, MD  docusate sodium (COLACE) 100 MG capsule Take 1 capsule (100 mg total) by  mouth 2 (two) times daily as needed for constipation. 03/01/12  Yes Joselyn Arrow, NP  Oxycodone HCl 20 MG TABS Take 20 mg by mouth 4 (four) times daily.  08/04/12  Yes Historical Provider, MD  PARoxetine (PAXIL) 20 MG tablet Take 20 mg by mouth every morning.  08/08/12  Yes Historical Provider, MD  polyethylene glycol powder (MIRALAX) powder Take 17 g by mouth daily as needed. 03/01/12  Yes Joselyn Arrow, NP    Allergies as of 08/16/2012  . (No Known Allergies)    Family History  Problem Relation Age of Onset  . Cirrhosis Father 48  . Lung cancer Mother 55  . Colon cancer Neg Hx     History   Social History  . Marital Status: Single    Spouse Name: N/A    Number of Children: 0  . Years of Education: N/A   Occupational History  . disabled    Social History Main Topics  . Smoking status: Former Smoker -- 0.50 packs/day for 40 years    Types: Cigarettes    Quit date: 01/19/2012  . Smokeless tobacco: Not on file  . Alcohol Use: No     Comment: HX 2 beers 3-4 days per week; QUIT DEC 2013  . Drug Use: No     Comment: Hx cocaine yrs ago, Last marijuana OCT 2013  . Sexually Active: Not Currently --  Male partner(s)   Other Topics Concern  . Not on file   Social History Narrative   Lives w/ significant other, Kevin Dillon    Review of Systems: See HPI, otherwise negative ROS  Physical Exam: BP 135/80  Pulse 72  Temp(Src) 98 F (36.7 C) (Oral)  Ht 6\' 1"  (1.854 m)  Wt 156 lb 9.6 oz (71.033 kg)  BMI 20.67 kg/m2 General:   Alert,  Well-developed, well-nourished, pleasant and cooperative in NAD Skin:  Intact without significant lesions or rashes. Eyes:  Sclera clear, no icterus.   Conjunctiva pink. Ears:  Normal auditory acuity. Nose:  No deformity, discharge,  or lesions. Mouth:  No deformity or lesions. Neck:  Supple; no masses or thyromegaly. No significant cervical adenopathy. Lungs:  Clear throughout to auscultation.   No wheezes, crackles, or rhonchi. No  acute distress. Heart:  Regular rate and rhythm; no murmurs, clicks, rubs,  or gallops. Abdomen: Non-distended, normal bowel sounds.  Soft and nontender without appreciable mass or hepatosplenomegaly.  Pulses:  Normal pulses noted. Extremities:  Without clubbing or edema.  Impression/Plan:  62 year old gentleman with chronic HCV being [place] liver [place]. I anticipate he will be started on HCV treatment this fall. He may well need cirrhosis care if he has significant fibrosis. He may need q 6 month liver imaging.  History of colonic adenoma; due for surveillance colonoscopy 2018.  We'll plan to see this nice gentleman back in 6 months.

## 2012-08-16 NOTE — Patient Instructions (Addendum)
Continue present regimen  Office visit in 6 months

## 2012-08-17 ENCOUNTER — Encounter: Payer: Self-pay | Admitting: Internal Medicine

## 2012-09-14 ENCOUNTER — Other Ambulatory Visit: Payer: Self-pay | Admitting: Internal Medicine

## 2012-09-14 DIAGNOSIS — B182 Chronic viral hepatitis C: Secondary | ICD-10-CM

## 2012-09-14 DIAGNOSIS — B1920 Unspecified viral hepatitis C without hepatic coma: Secondary | ICD-10-CM

## 2012-09-17 ENCOUNTER — Encounter (HOSPITAL_COMMUNITY): Payer: Self-pay | Admitting: Emergency Medicine

## 2012-09-17 ENCOUNTER — Emergency Department (HOSPITAL_COMMUNITY)
Admission: EM | Admit: 2012-09-17 | Discharge: 2012-09-17 | Disposition: A | Discharge status: Home / Self Care | Payer: Medicare Other | Attending: Emergency Medicine | Admitting: Emergency Medicine

## 2012-09-17 ENCOUNTER — Emergency Department (HOSPITAL_COMMUNITY): Payer: Medicare Other | Admitting: Anesthesiology

## 2012-09-17 ENCOUNTER — Encounter (HOSPITAL_COMMUNITY): Payer: Self-pay | Admitting: Anesthesiology

## 2012-09-17 ENCOUNTER — Encounter (HOSPITAL_COMMUNITY)
Admission: EM | Disposition: A | Discharge status: Home / Self Care | Payer: Self-pay | Source: Home / Self Care | Attending: Emergency Medicine

## 2012-09-17 ENCOUNTER — Emergency Department (HOSPITAL_COMMUNITY): Payer: Medicare Other

## 2012-09-17 DIAGNOSIS — I10 Essential (primary) hypertension: Secondary | ICD-10-CM | POA: Insufficient documentation

## 2012-09-17 DIAGNOSIS — S61209A Unspecified open wound of unspecified finger without damage to nail, initial encounter: Secondary | ICD-10-CM | POA: Insufficient documentation

## 2012-09-17 DIAGNOSIS — S62609B Fracture of unspecified phalanx of unspecified finger, initial encounter for open fracture: Secondary | ICD-10-CM

## 2012-09-17 DIAGNOSIS — S62639A Displaced fracture of distal phalanx of unspecified finger, initial encounter for closed fracture: Secondary | ICD-10-CM | POA: Insufficient documentation

## 2012-09-17 DIAGNOSIS — S68119A Complete traumatic metacarpophalangeal amputation of unspecified finger, initial encounter: Secondary | ICD-10-CM

## 2012-09-17 DIAGNOSIS — IMO0002 Reserved for concepts with insufficient information to code with codable children: Secondary | ICD-10-CM | POA: Insufficient documentation

## 2012-09-17 DIAGNOSIS — Z8673 Personal history of transient ischemic attack (TIA), and cerebral infarction without residual deficits: Secondary | ICD-10-CM | POA: Insufficient documentation

## 2012-09-17 DIAGNOSIS — S62639B Displaced fracture of distal phalanx of unspecified finger, initial encounter for open fracture: Secondary | ICD-10-CM | POA: Insufficient documentation

## 2012-09-17 DIAGNOSIS — W312XXA Contact with powered woodworking and forming machines, initial encounter: Secondary | ICD-10-CM | POA: Insufficient documentation

## 2012-09-17 HISTORY — PX: WOUND EXPLORATION: SHX6188

## 2012-09-17 HISTORY — PX: AMPUTATION: SHX166

## 2012-09-17 LAB — CBC WITH DIFFERENTIAL/PLATELET
HCT: 35.5 % — ABNORMAL LOW (ref 39.0–52.0)
Hemoglobin: 12.6 g/dL — ABNORMAL LOW (ref 13.0–17.0)
Lymphocytes Relative: 20 % (ref 12–46)
Lymphs Abs: 2 10*3/uL (ref 0.7–4.0)
MCHC: 35.5 g/dL (ref 30.0–36.0)
Monocytes Absolute: 0.7 10*3/uL (ref 0.1–1.0)
Monocytes Relative: 7 % (ref 3–12)
Neutro Abs: 7 10*3/uL (ref 1.7–7.7)
Neutrophils Relative %: 71 % (ref 43–77)
RBC: 3.94 MIL/uL — ABNORMAL LOW (ref 4.22–5.81)
WBC: 9.8 10*3/uL (ref 4.0–10.5)

## 2012-09-17 LAB — COMPREHENSIVE METABOLIC PANEL
Alkaline Phosphatase: 90 U/L (ref 39–117)
BUN: 16 mg/dL (ref 6–23)
CO2: 26 mEq/L (ref 19–32)
Chloride: 97 mEq/L (ref 96–112)
Creatinine, Ser: 0.99 mg/dL (ref 0.50–1.35)
GFR calc non Af Amer: 87 mL/min — ABNORMAL LOW (ref >90)
Glucose, Bld: 145 mg/dL — ABNORMAL HIGH (ref 70–99)
Potassium: 4.2 mEq/L (ref 3.5–5.1)
Total Bilirubin: 0.7 mg/dL (ref 0.3–1.2)

## 2012-09-17 LAB — PROTIME-INR
INR: 1.15 (ref 0.00–1.49)
Prothrombin Time: 14.5 seconds (ref 11.6–15.2)

## 2012-09-17 SURGERY — WOUND EXPLORATION
Anesthesia: General | Site: Hand | Laterality: Left

## 2012-09-17 MED ORDER — SODIUM CHLORIDE 0.9 % IR SOLN
Status: DC | PRN
  Administered 2012-09-17: 1

## 2012-09-17 MED ORDER — CEFAZOLIN SODIUM 1-5 GM-% IV SOLN
1.0000 g | Freq: Once | INTRAVENOUS | Status: AC
  Administered 2012-09-17: 1 g via INTRAVENOUS
  Filled 2012-09-17: qty 50

## 2012-09-17 MED ORDER — FENTANYL CITRATE 0.05 MG/ML IJ SOLN
INTRAMUSCULAR | Status: DC | PRN
  Administered 2012-09-17: 100 ug via INTRAVENOUS
  Administered 2012-09-17: 150 ug via INTRAVENOUS

## 2012-09-17 MED ORDER — OXYCODONE HCL 5 MG/5ML PO SOLN: 5.0000 mg | Freq: Once | ORAL | Status: DC | PRN

## 2012-09-17 MED ORDER — OXYCODONE-ACETAMINOPHEN 5-325 MG PO TABS: 1.0000 | ORAL_TABLET | ORAL | Status: DC | PRN

## 2012-09-17 MED ORDER — PROPOFOL 10 MG/ML IV BOLUS
INTRAVENOUS | Status: DC | PRN
  Administered 2012-09-17: 180 mg via INTRAVENOUS
  Administered 2012-09-17: 20 mg via INTRAVENOUS

## 2012-09-17 MED ORDER — HYDROMORPHONE HCL PF 1 MG/ML IJ SOLN: 0.2500 mg | INTRAMUSCULAR | Status: DC | PRN

## 2012-09-17 MED ORDER — SUCCINYLCHOLINE CHLORIDE 20 MG/ML IJ SOLN
INTRAMUSCULAR | Status: DC | PRN
  Administered 2012-09-17: 120 mg via INTRAVENOUS

## 2012-09-17 MED ORDER — OXYCODONE HCL 5 MG PO TABS: 5.0000 mg | ORAL_TABLET | Freq: Once | ORAL | Status: DC | PRN

## 2012-09-17 MED ORDER — BUPIVACAINE HCL 0.25 % IJ SOLN
INTRAMUSCULAR | Status: DC | PRN
  Administered 2012-09-17: 10 mL

## 2012-09-17 MED ORDER — HYDROMORPHONE HCL PF 1 MG/ML IJ SOLN
1.0000 mg | Freq: Once | INTRAMUSCULAR | Status: AC
Start: 2012-09-17 — End: 2012-09-17
  Administered 2012-09-17: 1 mg via INTRAVENOUS
  Filled 2012-09-17: qty 1

## 2012-09-17 MED ORDER — BUPIVACAINE HCL (PF) 0.25 % IJ SOLN
INTRAMUSCULAR | Status: AC
  Filled 2012-09-17: qty 30

## 2012-09-17 MED ORDER — LACTATED RINGERS IV SOLN
INTRAVENOUS | Status: DC | PRN
  Administered 2012-09-17 (×2): via INTRAVENOUS

## 2012-09-17 MED ORDER — HYDROMORPHONE HCL PF 1 MG/ML IJ SOLN
1.0000 mg | Freq: Once | INTRAMUSCULAR | Status: AC
  Administered 2012-09-17: 1 mg via INTRAVENOUS
  Filled 2012-09-17: qty 1

## 2012-09-17 MED ORDER — MIDAZOLAM HCL 5 MG/5ML IJ SOLN
INTRAMUSCULAR | Status: DC | PRN
  Administered 2012-09-17: 2 mg via INTRAVENOUS

## 2012-09-17 MED ORDER — EPHEDRINE SULFATE 50 MG/ML IJ SOLN
INTRAMUSCULAR | Status: DC | PRN
  Administered 2012-09-17 (×3): 10 mg via INTRAVENOUS

## 2012-09-17 MED ORDER — ONDANSETRON HCL 4 MG/2ML IJ SOLN: 4.0000 mg | Freq: Four times a day (QID) | INTRAMUSCULAR | Status: DC | PRN

## 2012-09-17 MED ORDER — TETANUS-DIPHTH-ACELL PERTUSSIS 5-2.5-18.5 LF-MCG/0.5 IM SUSP
0.5000 mL | Freq: Once | INTRAMUSCULAR | Status: AC
  Administered 2012-09-17: 0.5 mL via INTRAMUSCULAR
  Filled 2012-09-17: qty 0.5

## 2012-09-17 MED ORDER — CEFAZOLIN SODIUM-DEXTROSE 2-3 GM-% IV SOLR
INTRAVENOUS | Status: DC | PRN
  Administered 2012-09-17: 2 g via INTRAVENOUS

## 2012-09-17 MED ORDER — SODIUM CHLORIDE 0.9 % IV BOLUS (SEPSIS)
1000.0000 mL | Freq: Once | INTRAVENOUS | Status: AC
  Administered 2012-09-17: 1000 mL via INTRAVENOUS

## 2012-09-17 MED ORDER — CEFAZOLIN SODIUM-DEXTROSE 2-3 GM-% IV SOLR
INTRAVENOUS | Status: AC
  Filled 2012-09-17: qty 50

## 2012-09-17 MED ORDER — LIDOCAINE HCL (CARDIAC) 20 MG/ML IV SOLN
INTRAVENOUS | Status: DC | PRN
  Administered 2012-09-17: 60 mg via INTRAVENOUS

## 2012-09-17 SURGICAL SUPPLY — 60 items
BANDAGE CONFORM 2  STR LF (GAUZE/BANDAGES/DRESSINGS) IMPLANT
BANDAGE ELASTIC 3 VELCRO ST LF (GAUZE/BANDAGES/DRESSINGS) IMPLANT
BANDAGE ELASTIC 4 VELCRO ST LF (GAUZE/BANDAGES/DRESSINGS) ×3 IMPLANT
BANDAGE GAUZE ELAST BULKY 4 IN (GAUZE/BANDAGES/DRESSINGS) ×3 IMPLANT
BNDG CMPR 9X4 STRL LF SNTH (GAUZE/BANDAGES/DRESSINGS) ×2
BNDG COHESIVE 1X5 TAN STRL LF (GAUZE/BANDAGES/DRESSINGS) IMPLANT
BNDG ESMARK 4X9 LF (GAUZE/BANDAGES/DRESSINGS) ×3 IMPLANT
CLOTH BEACON ORANGE TIMEOUT ST (SAFETY) ×3 IMPLANT
CORDS BIPOLAR (ELECTRODE) ×3 IMPLANT
COVER SURGICAL LIGHT HANDLE (MISCELLANEOUS) ×3 IMPLANT
CUFF TOURNIQUET SINGLE 18IN (TOURNIQUET CUFF) ×3 IMPLANT
CUFF TOURNIQUET SINGLE 24IN (TOURNIQUET CUFF) IMPLANT
DECANTER SPIKE VIAL GLASS SM (MISCELLANEOUS) IMPLANT
DRAPE OEC MINIVIEW 54X84 (DRAPES) IMPLANT
DRAPE SURG 17X23 STRL (DRAPES) ×3 IMPLANT
DURAPREP 26ML APPLICATOR (WOUND CARE) IMPLANT
GAUZE SPONGE 2X2 8PLY STRL LF (GAUZE/BANDAGES/DRESSINGS) IMPLANT
GAUZE XEROFORM 1X8 LF (GAUZE/BANDAGES/DRESSINGS) IMPLANT
GAUZE XEROFORM 5X9 LF (GAUZE/BANDAGES/DRESSINGS) ×3 IMPLANT
GLOVE BIO SURGEON STRL SZ8.5 (GLOVE) IMPLANT
GLOVE BIOGEL PI IND STRL 8 (GLOVE) ×2 IMPLANT
GLOVE BIOGEL PI INDICATOR 8 (GLOVE) ×1
GOWN PREVENTION PLUS XLARGE (GOWN DISPOSABLE) ×3 IMPLANT
GOWN PREVENTION PLUS XXLARGE (GOWN DISPOSABLE) IMPLANT
GOWN SRG XL XLNG 56XLVL 4 (GOWN DISPOSABLE) ×2 IMPLANT
GOWN STRL NON-REIN LRG LVL3 (GOWN DISPOSABLE) ×3 IMPLANT
GOWN STRL NON-REIN XL XLG LVL4 (GOWN DISPOSABLE) ×3
KIT BASIN OR (CUSTOM PROCEDURE TRAY) ×3 IMPLANT
KIT ROOM TURNOVER OR (KITS) ×3 IMPLANT
LOOP VESSEL MAXI BLUE (MISCELLANEOUS) IMPLANT
MANIFOLD NEPTUNE II (INSTRUMENTS) ×3 IMPLANT
NEEDLE HYPO 25GX1X1/2 BEV (NEEDLE) ×3 IMPLANT
NS IRRIG 1000ML POUR BTL (IV SOLUTION) ×3 IMPLANT
PACK ORTHO EXTREMITY (CUSTOM PROCEDURE TRAY) ×3 IMPLANT
PAD ARMBOARD 7.5X6 YLW CONV (MISCELLANEOUS) ×3 IMPLANT
PAD CAST 3X4 CTTN HI CHSV (CAST SUPPLIES) IMPLANT
PAD CAST 4YDX4 CTTN HI CHSV (CAST SUPPLIES) ×2 IMPLANT
PADDING CAST COTTON 3X4 STRL (CAST SUPPLIES)
PADDING CAST COTTON 4X4 STRL (CAST SUPPLIES) ×3
SPEAR EYE SURG WECK-CEL (MISCELLANEOUS) IMPLANT
SPECIMEN JAR SMALL (MISCELLANEOUS) IMPLANT
SPLINT PLASTER CAST XFAST 4X15 (CAST SUPPLIES) ×2 IMPLANT
SPLINT PLASTER XTRA FAST SET 4 (CAST SUPPLIES) ×1
SPONGE GAUZE 2X2 STER 10/PKG (GAUZE/BANDAGES/DRESSINGS)
SPONGE GAUZE 4X4 12PLY (GAUZE/BANDAGES/DRESSINGS) ×3 IMPLANT
STRIP CLOSURE SKIN 1/2X4 (GAUZE/BANDAGES/DRESSINGS) IMPLANT
SUCTION FRAZIER TIP 10 FR DISP (SUCTIONS) ×3 IMPLANT
SUT ETHIBOND 3-0 V-5 (SUTURE) IMPLANT
SUT ETHILON 5 0 PS 2 18 (SUTURE) IMPLANT
SUT MERSILENE 4 0 P 3 (SUTURE) ×3 IMPLANT
SUT PROLENE 3 0 PS 2 (SUTURE) IMPLANT
SUT SILK 4 0 PS 2 (SUTURE) IMPLANT
SUT VIC AB 3-0 FS2 27 (SUTURE) IMPLANT
SUT VICRYL RAPIDE 4/0 PS 2 (SUTURE) ×6 IMPLANT
SYR CONTROL 10ML LL (SYRINGE) IMPLANT
TOWEL OR 17X24 6PK STRL BLUE (TOWEL DISPOSABLE) ×3 IMPLANT
TOWEL OR 17X26 10 PK STRL BLUE (TOWEL DISPOSABLE) ×3 IMPLANT
TUBE CONNECTING 12X1/4 (SUCTIONS) ×3 IMPLANT
UNDERPAD 30X30 INCONTINENT (UNDERPADS AND DIAPERS) ×3 IMPLANT
WATER STERILE IRR 1000ML POUR (IV SOLUTION) IMPLANT

## 2012-09-17 NOTE — ED Notes (Signed)
Pt was using a table saw this morning and his fingers got caught.  Middle and Ring finger of L hand involved. Partial amputation.  Bone, tendon visible.

## 2012-09-17 NOTE — Anesthesia Preprocedure Evaluation (Addendum)
Anesthesia Evaluation  Patient identified by MRN, date of birth, ID band Patient awake    Reviewed: Allergy & Precautions, H&P , NPO status , Patient's Chart, lab work & pertinent test results, reviewed documented beta blocker date and time   Airway Mallampati: II  Neck ROM: full    Dental  (+) Dental Advisory Given, Edentulous Upper and Edentulous Lower   Pulmonary former smoker,          Cardiovascular hypertension, Pt. on medications and Pt. on home beta blockers     Neuro/Psych CVA    GI/Hepatic (+) Hepatitis -, C  Endo/Other    Renal/GU      Musculoskeletal  (+) Arthritis -, Osteoarthritis,    Abdominal   Peds  Hematology   Anesthesia Other Findings   Reproductive/Obstetrics                          Anesthesia Physical Anesthesia Plan  ASA: III  Anesthesia Plan: General   Post-op Pain Management:    Induction: Intravenous  Airway Management Planned: Oral ETT  Additional Equipment:   Intra-op Plan:   Post-operative Plan: Extubation in OR  Informed Consent: I have reviewed the patients History and Physical, chart, labs and discussed the procedure including the risks, benefits and alternatives for the proposed anesthesia with the patient or authorized representative who has indicated his/her understanding and acceptance.     Plan Discussed with: CRNA, Anesthesiologist and Surgeon  Anesthesia Plan Comments:         Anesthesia Quick Evaluation

## 2012-09-17 NOTE — Anesthesia Procedure Notes (Signed)
Procedure Name: Intubation Date/Time: 09/17/2012 1:57 PM Performed by: Alanda Amass A Pre-anesthesia Checklist: Patient identified, Timeout performed, Emergency Drugs available, Suction available and Patient being monitored Patient Re-evaluated:Patient Re-evaluated prior to inductionOxygen Delivery Method: Circle system utilized Preoxygenation: Pre-oxygenation with 100% oxygen Intubation Type: IV induction, Rapid sequence and Cricoid Pressure applied Laryngoscope Size: Mac and 3 Grade View: Grade I Tube type: Oral Tube size: 7.5 mm Number of attempts: 1 Airway Equipment and Method: Stylet Placement Confirmation: ETT inserted through vocal cords under direct vision,  breath sounds checked- equal and bilateral and positive ETCO2 Secured at: 21 cm Tube secured with: Tape Dental Injury: Teeth and Oropharynx as per pre-operative assessment

## 2012-09-17 NOTE — Consult Note (Signed)
Reason for Consult:left hand tablesaw injury Referring Physician: lockwood  Kevin Dillon is an 62 y.o. male.  HPI: as above with open injuries to multple digits left hand  Past Medical History  Diagnosis Date  . Osteoarthritis   . CVA (cerebral infarction)   . Hypertension   . Chronic hepatitis C   . Chronic left shoulder pain   . Chronic knee pain   . Gout   . Hepatitis C     genotype 1b.  pt has been vaccinated against hep A and B.  . Tubular adenoma of colon 12/2011  . Cholelithiasis   . Schatzki's ring     Past Surgical History  Procedure Laterality Date  . Leg surgery      left  . Arm surgery      right/plate in arm  . Colonoscopy with esophagogastroduodenoscopy (egd)  12/30/2011    RMR: Noncritical Schatzki's ring;  Hiatal hernia, Tubular ADENOMA removed from splenic flexure, otherwise normal colonoscopy    Family History  Problem Relation Age of Onset  . Cirrhosis Father 89  . Lung cancer Mother 96  . Colon cancer Neg Hx     Social History:  reports that he quit smoking about 7 months ago. His smoking use included Cigarettes. He has a 20 pack-year smoking history. He does not have any smokeless tobacco history on file. He reports that he does not drink alcohol or use illicit drugs.  Allergies: No Known Allergies  Medications: Scheduled:   Results for orders placed during the hospital encounter of 09/17/12 (from the past 48 hour(s))  CBC WITH DIFFERENTIAL     Status: Abnormal   Collection Time    09/17/12 12:05 PM      Result Value Range   WBC 9.8  4.0 - 10.5 K/uL   RBC 3.94 (*) 4.22 - 5.81 MIL/uL   Hemoglobin 12.6 (*) 13.0 - 17.0 g/dL   HCT 14.7 (*) 82.9 - 56.2 %   MCV 90.1  78.0 - 100.0 fL   MCH 32.0  26.0 - 34.0 pg   MCHC 35.5  30.0 - 36.0 g/dL   RDW 13.0  86.5 - 78.4 %   Platelets 143 (*) 150 - 400 K/uL   Neutrophils Relative % 71  43 - 77 %   Neutro Abs 7.0  1.7 - 7.7 K/uL   Lymphocytes Relative 20  12 - 46 %   Lymphs Abs 2.0  0.7 - 4.0 K/uL    Monocytes Relative 7  3 - 12 %   Monocytes Absolute 0.7  0.1 - 1.0 K/uL   Eosinophils Relative 1  0 - 5 %   Eosinophils Absolute 0.1  0.0 - 0.7 K/uL   Basophils Relative 0  0 - 1 %   Basophils Absolute 0.0  0.0 - 0.1 K/uL    No results found.  Review of Systems  All other systems reviewed and are negative.   There were no vitals taken for this visit. Physical Exam  Constitutional: He is oriented to person, place, and time. He appears well-developed and well-nourished.  HENT:  Head: Normocephalic and atraumatic.  Cardiovascular: Normal rate.   Respiratory: Effort normal.  Musculoskeletal:       Left hand: He exhibits tenderness, bony tenderness, disruption of two-point discrimination, deformity and laceration.  tablesaw injury to left hand with complex open injuries to long and ring fingers  Neurological: He is alert and oriented to person, place, and time.  Skin: Skin is warm.  Psychiatric:  He has a normal mood and affect. His behavior is normal. Judgment and thought content normal.    Assessment/Plan: As above  Plan explore and repair as needed  Lashawn Bromwell A 09/17/2012, 12:24 PM

## 2012-09-17 NOTE — Preoperative (Signed)
Beta Blockers   Reason not to administer Beta Blockers:took atenolol 09/17/2012 @ 0800

## 2012-09-17 NOTE — ED Provider Notes (Signed)
CSN: [id number]     Arrival date & time 09/17/12  1147 History   First MD Initiated Contact with Patient 09/17/12 1157     Chief Complaint  Patient presents with  . Hand Injury   (Consider location/radiation/quality/duration/timing/severity/associated sxs/prior Treatment) HPI Patient presents after sustaining an injury to his left hand.  He was using a table saw.  A piece of wood it was stuck, and his fingers were dragged into the saw blade.  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  Since that time there's been severe pain in the area.  No other injuries, no attempts at relief thus far.  Patient was in his usual state of health prior to the accident.  Past Medical History  Diagnosis Date  . Osteoarthritis   . CVA (cerebral infarction)   . Hypertension   . Chronic hepatitis C   . Chronic left shoulder pain   . Chronic knee pain   . Gout   . Hepatitis C     genotype 1b.  pt has been vaccinated against hep A and B.  . Tubular adenoma of colon 12/2011  . Cholelithiasis   . Schatzki's ring    Past Surgical History  Procedure Laterality Date  . Leg surgery      left  . Arm surgery      right/plate in arm  . Colonoscopy with esophagogastroduodenoscopy (egd)  12/30/2011    RMR: Noncritical Schatzki's ring;  Hiatal hernia, Tubular ADENOMA removed from splenic flexure, otherwise normal colonoscopy   Family History  Problem Relation Age of Onset  . Cirrhosis Father 91  . Lung cancer Mother 48  . Colon cancer Neg Hx    History  Substance Use Topics  . Smoking status: Former Smoker -- 0.50 packs/day for 40 years    Types: Cigarettes    Quit date: 01/19/2012  . Smokeless tobacco: Not on file  . Alcohol Use: No     Comment: HX 2 beers 3-4 days per week; QUIT DEC 2013    Review of Systems  All other systems reviewed and are negative.    Allergies  Review of patient's allergies indicates  no known allergies.  Home Medications   Current Outpatient Rx  Name  Route  Sig  Dispense  Refill  . atenolol (TENORMIN) 50 MG tablet   Oral   Take 25 mg by mouth daily.          . cyproheptadine (PERIACTIN) 4 MG tablet   Oral   Take 4 mg by mouth every 6 (six) hours.          . docusate sodium (COLACE) 100 MG capsule   Oral   Take 1 capsule (100 mg total) by mouth 2 (two) times daily as needed for constipation.   60 capsule   5   . Oxycodone HCl 20 MG TABS   Oral   Take 20 mg by mouth 4 (four) times daily.          Marland Kitchen PARoxetine (PAXIL) 20 MG tablet   Oral   Take 20 mg by mouth every morning.          . polyethylene glycol powder (MIRALAX) powder   Oral   Take 17 g by mouth daily as needed.   527 g   11    There were no vitals taken for this visit. Physical Exam  Nursing note and vitals reviewed. Constitutional: He appears well-developed  and well-nourished. He appears distressed.  HENT:  Head: Normocephalic and atraumatic.  Eyes: Conjunctivae are normal. Right eye exhibits no discharge. Left eye exhibits no discharge.  Neck: No tracheal deviation present.  Cardiovascular: Normal rate, regular rhythm and intact distal pulses.   Pulmonary/Chest: No stridor. No respiratory distress.  Musculoskeletal:       Left wrist: Normal.       Arms: The left hand has notable lesions.    Skin: He is diaphoretic.    ED Course  Procedures (including critical care time) Labs Review Labs Reviewed  CBC WITH DIFFERENTIAL  COMPREHENSIVE METABOLIC PANEL  PROTIME-INR   Imaging Review No results found. Immediately after the initial evaluation I paged our hand surgeon for further evaluation and management assistance.  12:28 PM Patient continues to be in pain. I have discussed this case with our hand surgeon.  Patient will be expeditiously taken to the operating room for further evaluation and management.  I interpret the x-ray, notable for multiple fractures and  avulsion of the bone MDM  No diagnosis found. Patient presents immediately after sustaining multiple indications following a table saw accident.  With the severity of his injury I immediately spoke with our hand surgeon.  Patient received antibiotics, analgesics, fluid resuscitation, and was expeditiously taken to the operating room for surgical repair    Gerhard Munch, MD 09/17/12 [phone]

## 2012-09-17 NOTE — Op Note (Signed)
See note [phone]

## 2012-09-17 NOTE — Op Note (Signed)
Kevin Dillon, Kevin Dillon                 ACCOUNT NO.:  [id number]  MEDICAL RECORD NO.:  [id number]  LOCATION:  [facility]                         FACILITY:  [facility]  PHYSICIAN:  Artist Pais. Jenny Omdahl, M.D.DATE OF BIRTH:  07-Nov-1950  DATE OF PROCEDURE:  09/17/2012 DATE OF DISCHARGE:  09/17/2012                              OPERATIVE REPORT   PREOPERATIVE DIAGNOSIS:  Traumatic table saw injury left hand involving index, long, ring, and small fingers.  POSTOPERATIVE DIAGNOSIS:  Traumatic table saw injury left hand involving index, long, ring, and small fingers.  PROCEDURE:  I and D of left index finger, fillet flap and proximal interphalangeal level amputation of left long finger, metacarpophalangeal joint level amputation of left ring finger, and I and D and extensor tendon repair of left small finger.  SURGEON:  Artist Pais. Mina Marble, M.D.  ASSISTANT:  None.  ANESTHESIA:  General.  COMPLICATIONS:  No complication.  DRAINS:  No drains.  DESCRIPTION OF PROCEDURE:  The patient was taken to the operating suite. After induction of adequate general anesthesia, left upper extremity was prepped and draped in sterile fashion.  An Esmarch was used to exsanguinate the limb.  Tourniquet was then inflated to 250 mmHg.  At this point in time, the left hand was approached surgically.  This is a traumatic table saw injury with no injury to the thumb, superficial lacerations, and nail plate fracture to the index finger.  A segmental complex injury to the long finger with loss of the majority of the middle and distal phalanges and a comminuted proximal phalangeal fracture.  Ring finger had complete amputation at the level of the base of the proximal phalanx.  Small finger had an extensor tendon laceration dorsum of the PIP joint.  The wounds were thoroughly irrigated and debrided of nonviable material.  Once this was done, a fillet flap of the long finger was undertaken.  The bony amputation level was  the proximal phalanx, the head of the proximal phalanx was smoothed down using a rongeur.  Once this was done, bilateral neurectomies were performed and a volarly based flap was used to cover the exposed proximal phalangeal bone from the fillet flap.  The ring finger, there was an amputation at the base of the proximal phalanx.  This was carefully subperiosteally dissected until the metacarpal head was evident.  Bilateral neurectomies were performed as well as padding of the extensor and flexor tendons and finally, the ring finger had an extensor tendon of the proximal phalangeal joint.  The cut was extended proximally and distally.  Extensor tendon was repaired with 4-0 Mersilene.  All the wounds were then thoroughly irrigated.  Again, hemostasis was achieved bipolar cautery, they were loosely closed with 4-0 Vicryl Rapide.  Xeroform, 4x4s, fluffs, and a compressive dressing volar splint was applied.  The patient tolerated procedure well and went to recovery room in stable fashion.     Artist Pais Mina Marble, M.D.     MAW/MEDQ  D:  09/17/2012  T:  09/17/2012  Job:  [id number]

## 2012-09-17 NOTE — Progress Notes (Signed)
Money counted with security and sent with security. Patient has ring and watch on.

## 2012-09-17 NOTE — Anesthesia Postprocedure Evaluation (Signed)
Anesthesia Post Note  Patient: [facility]  Procedure(s) Performed: Procedure(s) (LRB): WOUND EXPLORATION (Left) SMALL FINGER EXTENSOR TENDON REPAIR; METACARPAL LEVEL AMPUTATION RING FINGER; PROXIMAL PHALANX LEVEL AMPUTATION LONG FINGER (Left)  Anesthesia type: General  Patient location: PACU  Post pain: Pain level controlled and Adequate analgesia  Post assessment: Post-op Vital signs reviewed, Patient's Cardiovascular Status Stable, Respiratory Function Stable, Patent Airway and Pain level controlled  Last Vitals:  Filed Vitals:   09/17/12 1545  BP: 162/77  Pulse: 67  Temp:   Resp: 12    Post vital signs: Reviewed and stable  Level of consciousness: awake, alert  and oriented  Complications: No apparent anesthesia complications

## 2012-09-17 NOTE — Transfer of Care (Signed)
Immediate Anesthesia Transfer of Care Note  Patient: [facility]  Procedure(s) Performed: Procedure(s): WOUND EXPLORATION (Left) AMPUTATION DIGIT (Left)  Patient Location: PACU  Anesthesia Type:General  Level of Consciousness: awake  Airway & Oxygen Therapy: Patient Spontanous Breathing and Patient connected to nasal cannula oxygen  Post-op Assessment: Report given to PACU RN and Post -op Vital signs reviewed and stable  Post vital signs: Reviewed and stable  Complications: No apparent anesthesia complications

## 2012-09-20 ENCOUNTER — Encounter (HOSPITAL_COMMUNITY): Payer: Self-pay | Admitting: Orthopedic Surgery

## 2012-09-21 ENCOUNTER — Other Ambulatory Visit: Payer: Medicare Other

## 2012-09-28 ENCOUNTER — Ambulatory Visit
Admission: RE | Admit: 2012-09-28 | Discharge: 2012-09-28 | Disposition: A | Discharge status: Home / Self Care | Payer: Medicare Other | Source: Ambulatory Visit | Attending: Internal Medicine | Admitting: Internal Medicine

## 2012-09-28 DIAGNOSIS — B182 Chronic viral hepatitis C: Secondary | ICD-10-CM

## 2012-10-03 ENCOUNTER — Other Ambulatory Visit: Payer: Self-pay | Admitting: Internal Medicine

## 2012-10-03 DIAGNOSIS — K769 Liver disease, unspecified: Secondary | ICD-10-CM

## 2012-10-10 ENCOUNTER — Ambulatory Visit
Admission: RE | Admit: 2012-10-10 | Discharge: 2012-10-10 | Disposition: A | Discharge status: Home / Self Care | Payer: Medicare Other | Source: Ambulatory Visit | Attending: Internal Medicine | Admitting: Internal Medicine

## 2012-10-10 ENCOUNTER — Other Ambulatory Visit: Payer: Self-pay | Admitting: Internal Medicine

## 2012-10-10 DIAGNOSIS — Z77018 Contact with and (suspected) exposure to other hazardous metals: Secondary | ICD-10-CM

## 2012-10-10 DIAGNOSIS — K769 Liver disease, unspecified: Secondary | ICD-10-CM

## 2012-10-10 MED ORDER — GADOXETATE DISODIUM 0.25 MMOL/ML IV SOLN
7.0000 mL | Freq: Once | INTRAVENOUS | Status: AC | PRN
  Administered 2012-10-10: 7 mL via INTRAVENOUS

## 2013-02-01 ENCOUNTER — Other Ambulatory Visit: Payer: Self-pay | Admitting: Internal Medicine

## 2013-02-01 ENCOUNTER — Ambulatory Visit
Admission: RE | Admit: 2013-02-01 | Discharge: 2013-02-01 | Disposition: A | Discharge status: Home / Self Care | Payer: Medicare Other | Source: Ambulatory Visit | Attending: Internal Medicine | Admitting: Internal Medicine

## 2013-02-01 DIAGNOSIS — K769 Liver disease, unspecified: Secondary | ICD-10-CM

## 2013-02-01 MED ORDER — GADOXETATE DISODIUM 0.25 MMOL/ML IV SOLN
7.0000 mL | Freq: Once | INTRAVENOUS | Status: AC | PRN
  Administered 2013-02-01: 7 mL via INTRAVENOUS

## 2013-03-29 ENCOUNTER — Other Ambulatory Visit: Payer: Self-pay

## 2013-03-30 MED ORDER — DOCUSATE SODIUM 100 MG PO CAPS: 100.0000 mg | ORAL_CAPSULE | Freq: Two times a day (BID) | ORAL | Status: DC | PRN

## 2013-04-12 ENCOUNTER — Other Ambulatory Visit: Payer: Self-pay | Admitting: Nurse Practitioner

## 2013-04-12 DIAGNOSIS — K769 Liver disease, unspecified: Secondary | ICD-10-CM

## 2013-04-14 ENCOUNTER — Encounter (INDEPENDENT_AMBULATORY_CARE_PROVIDER_SITE_OTHER): Payer: Self-pay

## 2013-04-14 ENCOUNTER — Ambulatory Visit (INDEPENDENT_AMBULATORY_CARE_PROVIDER_SITE_OTHER): Payer: Medicare Other | Admitting: Internal Medicine

## 2013-04-14 ENCOUNTER — Encounter: Payer: Self-pay | Admitting: Internal Medicine

## 2013-04-14 VITALS — BP 142/68 | HR 71 | Temp 97.1°F | Ht 73.0 in | Wt 165.4 lb

## 2013-04-14 DIAGNOSIS — K7460 Unspecified cirrhosis of liver: Secondary | ICD-10-CM

## 2013-04-14 DIAGNOSIS — C220 Liver cell carcinoma: Secondary | ICD-10-CM

## 2013-04-14 DIAGNOSIS — C228 Malignant neoplasm of liver, primary, unspecified as to type: Secondary | ICD-10-CM

## 2013-04-14 NOTE — Patient Instructions (Signed)
Keep all appointments with [facility]  Ask Dr. Willey Blade about an orthopedic surgeon  Office visit here in 3 months

## 2013-04-16 ENCOUNTER — Ambulatory Visit
Admission: RE | Admit: 2013-04-16 | Discharge: 2013-04-16 | Disposition: A | Discharge status: Home / Self Care | Payer: Medicare Other | Source: Ambulatory Visit | Attending: Nurse Practitioner | Admitting: Nurse Practitioner

## 2013-04-16 DIAGNOSIS — K769 Liver disease, unspecified: Secondary | ICD-10-CM

## 2013-04-16 MED ORDER — GADOXETATE DISODIUM 0.25 MMOL/ML IV SOLN
7.0000 mL | Freq: Once | INTRAVENOUS | Status: AC | PRN
  Administered 2013-04-16: 7 mL via INTRAVENOUS

## 2013-04-18 DIAGNOSIS — C220 Liver cell carcinoma: Secondary | ICD-10-CM | POA: Insufficient documentation

## 2013-04-18 NOTE — Progress Notes (Signed)
Primary Care Physician:  Asencion Noble, MD Primary Gastroenterologist:  Dr. [name]  Pre-Procedure History & Physical:  Wants to discuss his liver issues with me today. HPI:  [facility] is a 63 y.o. male here for for followup discuss cirrhosis now complicated by hepatocellular carcinoma. Recently diagnosed hepatocellular carcinoma. Apparently, he remains within [facility] criteria for transplantation. Being  followed at [facility] liver clinic. Currently undergoing treatment for hepatitis C genotype 1B  With Rozann Lesches;  I understand he has had a good initial response. He complains to me of the bilateral hip pain and wants me to refer him to an orthopedic surgeon. He has curtailed cigarette smoking significantly but has not completely stopped. He was started on Chantix recently by the [facility] provider.  Past Medical History  Diagnosis Date  . Osteoarthritis   . CVA (cerebral infarction)   . Hypertension   . Chronic hepatitis C   . Chronic left shoulder pain   . Chronic knee pain   . Gout   . Hepatitis C     genotype 1b.  pt has been vaccinated against hep A and B.  . Tubular adenoma of colon 12/2011  . Cholelithiasis   . Schatzki's ring     Past Surgical History  Procedure Laterality Date  . Leg surgery      left  . Arm surgery      right/plate in arm  . Colonoscopy with esophagogastroduodenoscopy (egd)  12/30/2011    RMR: Noncritical Schatzki's ring;  Hiatal hernia, Tubular ADENOMA removed from splenic flexure, otherwise normal colonoscopy  . Wound exploration Left 09/17/2012    Procedure: WOUND EXPLORATION;  Surgeon: Schuyler Amor, MD;  Location: [place];  Service: Orthopedics;  Laterality: Left;  . Amputation Left 09/17/2012    Procedure: SMALL FINGER EXTENSOR TENDON REPAIR; METACARPAL LEVEL AMPUTATION RING FINGER; PROXIMAL PHALANX LEVEL AMPUTATION LONG FINGER;  Surgeon: Schuyler Amor, MD;  Location: [place];  Service: Orthopedics;  Laterality: Left;    Prior to Admission medications    Medication Sig Start Date End Date Taking? Authorizing Provider  ALPRAZolam Duanne Moron) 1 MG tablet Take 1 mg by mouth at bedtime as needed for anxiety.   Yes Historical Provider, MD  atenolol (TENORMIN) 50 MG tablet Take 25 mg by mouth daily.  11/04/11  Yes Historical Provider, MD  cyproheptadine (PERIACTIN) 4 MG tablet Take 4 mg by mouth every 6 (six) hours.  10/17/11  Yes Historical Provider, MD  docusate sodium (COLACE) 100 MG capsule Take 1 capsule (100 mg total) by mouth 2 (two) times daily as needed.   Yes Orvil Feil, NP  Multiple Vitamins-Minerals (CENTRUM SILVER PO) Take by mouth.   Yes Historical Provider, MD  Ombitas-Paritapre-Ritona-Dasab (VIEKIRA PAK PO) Take by mouth. 250 mg   One pak daily         ( includes 3 pills in the morning and one pill at night)   Yes Historical Provider, MD  Oxycodone HCl 20 MG TABS Take 20 mg by mouth 4 (four) times daily.  08/04/12  Yes Historical Provider, MD  PARoxetine (PAXIL) 20 MG tablet Take 20 mg by mouth every morning.  08/08/12  Yes Historical Provider, MD  ribavirin (REBETOL) 200 MG capsule Take 200 mg by mouth 2 (two) times daily.   Yes Historical Provider, MD  oxyCODONE-acetaminophen (ROXICET) 5-325 MG per tablet Take 1 tablet by mouth every 4 (four) hours as needed for pain. 09/17/12   Schuyler Amor, MD  polyethylene glycol [facility] / Floria Raveling) packet  Take 17 g by mouth daily as needed (constipation).    Historical Provider, MD    Allergies as of 04/14/2013  . (No Known Allergies)    Family History  Problem Relation Age of Onset  . Cirrhosis Father 21  . Lung cancer Mother 22  . Colon cancer Neg Hx     History   Social History  . Marital Status: Single    Spouse Name: N/A    Number of Children: 0  . Years of Education: N/A   Occupational History  . disabled    Social History Main Topics  . Smoking status: Former Smoker -- 0.50 packs/day for 40 years    Types: Cigarettes    Quit date: 01/19/2012  . Smokeless tobacco: Not  on file     Comment: Smokes 1/2 pack of cigarettes daily  . Alcohol Use: No     Comment: HX 2 beers 3-4 days per week; QUIT DEC 2013  . Drug Use: No     Comment: Hx cocaine yrs ago, Last marijuana OCT 2013  . Sexual Activity: Not Currently    Partners: Female   Other Topics Concern  . Not on file   Social History Narrative   Lives w/ significant other, [name]    Review of Systems: See HPI, otherwise negative ROS  Physical Exam: BP 142/68  Pulse 71  Temp(Src) 97.1 F (36.2 C) (Oral)  Ht 6\' 1"  (1.854 m)  Wt 165 lb 6.4 oz (75.025 kg)  BMI 21.83 kg/m2 General:   Alert,  somewhat chronically ill appearing gentleman he by his wife. Appears in no acute distress. Eyes:  Sclera clear, no icterus.   Conjunctiva pink. Ears:  Normal auditory acuity. Nose:  No deformity, discharge,  or lesions. Mouth:  No deformity or lesions. Neck:  Supple; no masses or thyromegaly. No significant cervical adenopathy. Lungs:  Clear throughout to auscultation.   No wheezes, crackles, or rhonchi. No acute distress. Heart:  Regular rate and rhythm; no murmurs, clicks, rubs,  or gallops. Abdomen: Non-distended, normal bowel sounds.  Soft and nontender without appreciable mass or hepatosplenomegaly.  Pulses:  Normal pulses noted. Extremities:  Without clubbing or edema.  Impression:  Cirrhosis secondary to HCV infection, complicated by hepatocellular carcinoma.. Currently undergoing treatment for HCV as outlined above.  Sounds like he may be moving towards him transplant listing.  I. discussion with him about the importance of compliance with [facility]'s recommendations. I went into some detail how it is imperative for him to completely stop smoking. Hip pain of uncertain significance and etiology.  Recommendations:    Keep all appointments with [facility]  Ask Dr. Willey Blade about an orthopedic surgeon  Office visit here in 3 months

## 2013-07-12 ENCOUNTER — Encounter: Payer: Self-pay | Admitting: Internal Medicine

## 2013-07-24 ENCOUNTER — Telehealth: Payer: Self-pay

## 2013-07-24 MED ORDER — POLYETHYLENE GLYCOL 3350 17 G PO PACK: 17.0000 g | PACK | Freq: Every day | ORAL | Status: DC | PRN

## 2013-07-24 NOTE — Telephone Encounter (Signed)
We have not received any request from the pharmacy. RX done.

## 2013-07-24 NOTE — Telephone Encounter (Signed)
Pt's wife called to get refill on his Miralax. The pharmacy said that we had not responded to request. She will call back end of July and try to get pt in Sept appt to see Dr. [name]. They received a letter that it is time.

## 2013-07-24 NOTE — Telephone Encounter (Signed)
Pt is aware.  

## 2013-08-17 ENCOUNTER — Encounter: Payer: Self-pay | Admitting: Internal Medicine

## 2014-10-23 ENCOUNTER — Other Ambulatory Visit (HOSPITAL_COMMUNITY): Payer: Self-pay | Admitting: Orthopedic Surgery

## 2014-10-23 DIAGNOSIS — I739 Peripheral vascular disease, unspecified: Secondary | ICD-10-CM

## 2014-10-24 ENCOUNTER — Ambulatory Visit (HOSPITAL_COMMUNITY)
Admission: RE | Admit: 2014-10-24 | Discharge: 2014-10-24 | Disposition: A | Discharge status: Home / Self Care | Payer: Medicare Other | Source: Ambulatory Visit | Attending: Cardiovascular Disease | Admitting: Cardiovascular Disease

## 2014-10-24 DIAGNOSIS — Z87891 Personal history of nicotine dependence: Secondary | ICD-10-CM | POA: Diagnosis not present

## 2014-10-24 DIAGNOSIS — I10 Essential (primary) hypertension: Secondary | ICD-10-CM | POA: Insufficient documentation

## 2014-10-24 DIAGNOSIS — I739 Peripheral vascular disease, unspecified: Secondary | ICD-10-CM | POA: Insufficient documentation

## 2015-01-23 DIAGNOSIS — M1712 Unilateral primary osteoarthritis, left knee: Secondary | ICD-10-CM | POA: Diagnosis not present

## 2015-01-30 DIAGNOSIS — B1920 Unspecified viral hepatitis C without hepatic coma: Secondary | ICD-10-CM | POA: Diagnosis not present

## 2015-01-30 DIAGNOSIS — F419 Anxiety disorder, unspecified: Secondary | ICD-10-CM | POA: Diagnosis not present

## 2015-01-30 DIAGNOSIS — I10 Essential (primary) hypertension: Secondary | ICD-10-CM | POA: Diagnosis not present

## 2015-01-30 DIAGNOSIS — M545 Low back pain: Secondary | ICD-10-CM | POA: Diagnosis not present

## 2015-02-28 DIAGNOSIS — K828 Other specified diseases of gallbladder: Secondary | ICD-10-CM | POA: Diagnosis not present

## 2015-02-28 DIAGNOSIS — B1920 Unspecified viral hepatitis C without hepatic coma: Secondary | ICD-10-CM | POA: Diagnosis not present

## 2015-02-28 DIAGNOSIS — Z79899 Other long term (current) drug therapy: Secondary | ICD-10-CM | POA: Diagnosis not present

## 2015-02-28 DIAGNOSIS — I10 Essential (primary) hypertension: Secondary | ICD-10-CM | POA: Diagnosis not present

## 2015-02-28 DIAGNOSIS — C220 Liver cell carcinoma: Secondary | ICD-10-CM | POA: Diagnosis not present

## 2015-02-28 DIAGNOSIS — M79609 Pain in unspecified limb: Secondary | ICD-10-CM | POA: Diagnosis not present

## 2015-02-28 DIAGNOSIS — R599 Enlarged lymph nodes, unspecified: Secondary | ICD-10-CM | POA: Diagnosis not present

## 2015-03-29 DIAGNOSIS — B1920 Unspecified viral hepatitis C without hepatic coma: Secondary | ICD-10-CM | POA: Diagnosis not present

## 2015-03-29 DIAGNOSIS — M79609 Pain in unspecified limb: Secondary | ICD-10-CM | POA: Diagnosis not present

## 2015-03-29 DIAGNOSIS — I10 Essential (primary) hypertension: Secondary | ICD-10-CM | POA: Diagnosis not present

## 2015-03-29 DIAGNOSIS — Z79899 Other long term (current) drug therapy: Secondary | ICD-10-CM | POA: Diagnosis not present

## 2015-04-16 DIAGNOSIS — K7460 Unspecified cirrhosis of liver: Secondary | ICD-10-CM | POA: Diagnosis not present

## 2015-04-16 DIAGNOSIS — F3340 Major depressive disorder, recurrent, in remission, unspecified: Secondary | ICD-10-CM | POA: Diagnosis not present

## 2015-04-29 DIAGNOSIS — F419 Anxiety disorder, unspecified: Secondary | ICD-10-CM | POA: Diagnosis not present

## 2015-04-29 DIAGNOSIS — M79609 Pain in unspecified limb: Secondary | ICD-10-CM | POA: Diagnosis not present

## 2015-04-29 DIAGNOSIS — I10 Essential (primary) hypertension: Secondary | ICD-10-CM | POA: Diagnosis not present

## 2015-04-29 DIAGNOSIS — B1920 Unspecified viral hepatitis C without hepatic coma: Secondary | ICD-10-CM | POA: Diagnosis not present

## 2015-05-27 DIAGNOSIS — M79609 Pain in unspecified limb: Secondary | ICD-10-CM | POA: Diagnosis not present

## 2015-05-27 DIAGNOSIS — I10 Essential (primary) hypertension: Secondary | ICD-10-CM | POA: Diagnosis not present

## 2015-05-27 DIAGNOSIS — Z79899 Other long term (current) drug therapy: Secondary | ICD-10-CM | POA: Diagnosis not present

## 2015-05-27 DIAGNOSIS — B1920 Unspecified viral hepatitis C without hepatic coma: Secondary | ICD-10-CM | POA: Diagnosis not present

## 2015-06-24 DIAGNOSIS — Z79899 Other long term (current) drug therapy: Secondary | ICD-10-CM | POA: Diagnosis not present

## 2015-06-24 DIAGNOSIS — I10 Essential (primary) hypertension: Secondary | ICD-10-CM | POA: Diagnosis not present

## 2015-06-24 DIAGNOSIS — M79609 Pain in unspecified limb: Secondary | ICD-10-CM | POA: Diagnosis not present

## 2015-06-24 DIAGNOSIS — F419 Anxiety disorder, unspecified: Secondary | ICD-10-CM | POA: Diagnosis not present

## 2015-07-18 DIAGNOSIS — K7469 Other cirrhosis of liver: Secondary | ICD-10-CM | POA: Diagnosis not present

## 2015-07-18 DIAGNOSIS — Z720 Tobacco use: Secondary | ICD-10-CM | POA: Diagnosis not present

## 2015-07-18 DIAGNOSIS — C220 Liver cell carcinoma: Secondary | ICD-10-CM | POA: Diagnosis not present

## 2015-07-24 DIAGNOSIS — I10 Essential (primary) hypertension: Secondary | ICD-10-CM | POA: Diagnosis not present

## 2015-07-24 DIAGNOSIS — C220 Liver cell carcinoma: Secondary | ICD-10-CM | POA: Diagnosis not present

## 2015-07-24 DIAGNOSIS — M79609 Pain in unspecified limb: Secondary | ICD-10-CM | POA: Diagnosis not present

## 2015-07-24 DIAGNOSIS — F3289 Other specified depressive episodes: Secondary | ICD-10-CM | POA: Diagnosis not present

## 2015-07-24 DIAGNOSIS — Z79899 Other long term (current) drug therapy: Secondary | ICD-10-CM | POA: Diagnosis not present

## 2015-07-24 DIAGNOSIS — K7469 Other cirrhosis of liver: Secondary | ICD-10-CM | POA: Diagnosis not present

## 2015-07-24 DIAGNOSIS — M545 Low back pain: Secondary | ICD-10-CM | POA: Diagnosis not present

## 2015-08-19 DIAGNOSIS — R634 Abnormal weight loss: Secondary | ICD-10-CM | POA: Diagnosis not present

## 2015-08-19 DIAGNOSIS — K7460 Unspecified cirrhosis of liver: Secondary | ICD-10-CM | POA: Diagnosis not present

## 2015-08-20 ENCOUNTER — Ambulatory Visit
Admission: RE | Admit: 2015-08-20 | Discharge: 2015-08-20 | Disposition: A | Discharge status: Home / Self Care | Payer: Medicare Other | Source: Ambulatory Visit | Attending: Internal Medicine | Admitting: Internal Medicine

## 2015-08-20 ENCOUNTER — Other Ambulatory Visit: Payer: Self-pay | Admitting: Internal Medicine

## 2015-08-20 DIAGNOSIS — J449 Chronic obstructive pulmonary disease, unspecified: Secondary | ICD-10-CM | POA: Diagnosis not present

## 2015-08-20 DIAGNOSIS — R634 Abnormal weight loss: Secondary | ICD-10-CM

## 2015-08-21 DIAGNOSIS — B1920 Unspecified viral hepatitis C without hepatic coma: Secondary | ICD-10-CM | POA: Diagnosis not present

## 2015-08-21 DIAGNOSIS — I10 Essential (primary) hypertension: Secondary | ICD-10-CM | POA: Diagnosis not present

## 2015-08-21 DIAGNOSIS — Z79899 Other long term (current) drug therapy: Secondary | ICD-10-CM | POA: Diagnosis not present

## 2015-08-21 DIAGNOSIS — M79609 Pain in unspecified limb: Secondary | ICD-10-CM | POA: Diagnosis not present

## 2015-09-05 DIAGNOSIS — K635 Polyp of colon: Secondary | ICD-10-CM | POA: Diagnosis not present

## 2015-09-05 DIAGNOSIS — I10 Essential (primary) hypertension: Secondary | ICD-10-CM | POA: Diagnosis not present

## 2015-09-05 DIAGNOSIS — Z8619 Personal history of other infectious and parasitic diseases: Secondary | ICD-10-CM | POA: Diagnosis not present

## 2015-09-05 DIAGNOSIS — C220 Liver cell carcinoma: Secondary | ICD-10-CM | POA: Diagnosis not present

## 2015-09-05 DIAGNOSIS — M1990 Unspecified osteoarthritis, unspecified site: Secondary | ICD-10-CM | POA: Diagnosis not present

## 2015-09-05 DIAGNOSIS — Z8673 Personal history of transient ischemic attack (TIA), and cerebral infarction without residual deficits: Secondary | ICD-10-CM | POA: Diagnosis not present

## 2015-09-05 DIAGNOSIS — K759 Inflammatory liver disease, unspecified: Secondary | ICD-10-CM | POA: Diagnosis not present

## 2015-09-24 DIAGNOSIS — M79609 Pain in unspecified limb: Secondary | ICD-10-CM | POA: Diagnosis not present

## 2015-09-24 DIAGNOSIS — I10 Essential (primary) hypertension: Secondary | ICD-10-CM | POA: Diagnosis not present

## 2015-09-24 DIAGNOSIS — F419 Anxiety disorder, unspecified: Secondary | ICD-10-CM | POA: Diagnosis not present

## 2015-09-24 DIAGNOSIS — Z79899 Other long term (current) drug therapy: Secondary | ICD-10-CM | POA: Diagnosis not present

## 2015-10-01 ENCOUNTER — Telehealth: Payer: Self-pay | Admitting: *Deleted

## 2015-10-01 NOTE — Telephone Encounter (Signed)
Left VM to give "heads up" on referral she was sending to Dr. Burr Medico. 69 YOM with Hepatitis C that was treated and cured. Has HCC and had TACE/ablation X 3 in past. F/U MRI shows portal vein invasion and a new lesion. He missed his window of opportunity for transplant because he would not stop smoking. Did not qualify for clinical trial at [facility]. Now being referral to local oncology for systemic treatment. Forwarded note to new patient coordinator.

## 2015-10-04 ENCOUNTER — Telehealth: Payer: Self-pay | Admitting: *Deleted

## 2015-10-10 ENCOUNTER — Ambulatory Visit: Payer: Medicare Other | Admitting: Hematology

## 2015-10-11 ENCOUNTER — Telehealth: Payer: Self-pay | Admitting: Hematology

## 2015-10-11 ENCOUNTER — Encounter: Payer: Self-pay | Admitting: Hematology

## 2015-10-11 ENCOUNTER — Ambulatory Visit (HOSPITAL_BASED_OUTPATIENT_CLINIC_OR_DEPARTMENT_OTHER): Payer: Medicare Other | Admitting: Hematology

## 2015-10-11 ENCOUNTER — Ambulatory Visit (HOSPITAL_BASED_OUTPATIENT_CLINIC_OR_DEPARTMENT_OTHER): Payer: Medicare Other

## 2015-10-11 ENCOUNTER — Telehealth: Payer: Self-pay | Admitting: *Deleted

## 2015-10-11 VITALS — BP 130/72 | HR 93 | Temp 98.8°F | Resp 18 | Ht 73.0 in | Wt 139.4 lb

## 2015-10-11 DIAGNOSIS — G893 Neoplasm related pain (acute) (chronic): Secondary | ICD-10-CM

## 2015-10-11 DIAGNOSIS — C220 Liver cell carcinoma: Secondary | ICD-10-CM | POA: Diagnosis not present

## 2015-10-11 DIAGNOSIS — R109 Unspecified abdominal pain: Secondary | ICD-10-CM

## 2015-10-11 LAB — COMPREHENSIVE METABOLIC PANEL
ALBUMIN: 3.8 g/dL (ref 3.5–5.0)
ALK PHOS: 165 U/L — AB (ref 40–150)
ALT: 38 U/L (ref 0–55)
AST: 41 U/L — AB (ref 5–34)
Anion Gap: 11 mEq/L (ref 3–11)
BILIRUBIN TOTAL: 1.04 mg/dL (ref 0.20–1.20)
BUN: 16.7 mg/dL (ref 7.0–26.0)
CO2: 20 mEq/L — ABNORMAL LOW (ref 22–29)
Calcium: 9.6 mg/dL (ref 8.4–10.4)
Chloride: 106 mEq/L (ref 98–109)
Creatinine: 1 mg/dL (ref 0.7–1.3)
EGFR: 80 mL/min/{1.73_m2} — ABNORMAL LOW (ref >90)
GLUCOSE: 88 mg/dL (ref 70–140)
POTASSIUM: 4.8 meq/L (ref 3.5–5.1)
SODIUM: 137 meq/L (ref 136–145)
TOTAL PROTEIN: 8.1 g/dL (ref 6.4–8.3)

## 2015-10-11 LAB — CBC WITH DIFFERENTIAL/PLATELET
BASO%: 0.2 % (ref 0.0–2.0)
Basophils Absolute: 0 10*3/uL (ref 0.0–0.1)
EOS%: 0.5 % (ref 0.0–7.0)
Eosinophils Absolute: 0.1 10*3/uL (ref 0.0–0.5)
HCT: 40.6 % (ref 38.4–49.9)
HEMOGLOBIN: 14 g/dL (ref 13.0–17.1)
LYMPH%: 15.1 % (ref 14.0–49.0)
MCH: 31.4 pg (ref 27.2–33.4)
MCHC: 34.5 g/dL (ref 32.0–36.0)
MCV: 91 fL (ref 79.3–98.0)
MONO#: 1.4 10*3/uL — ABNORMAL HIGH (ref 0.1–0.9)
MONO%: 10.7 % (ref 0.0–14.0)
NEUT%: 73.5 % (ref 39.0–75.0)
NEUTROS ABS: 9.5 10*3/uL — AB (ref 1.5–6.5)
Platelets: 201 10*3/uL (ref 140–400)
RBC: 4.46 10*6/uL (ref 4.20–5.82)
RDW: 13.4 % (ref 11.0–14.6)
WBC: 12.9 10*3/uL — AB (ref 4.0–10.3)
lymph#: 2 10*3/uL (ref 0.9–3.3)

## 2015-10-11 MED ORDER — OXYCODONE HCL ER 30 MG PO T12A: 30.0000 mg | EXTENDED_RELEASE_TABLET | Freq: Two times a day (BID) | ORAL | 0 refills | Status: DC

## 2015-10-11 MED ORDER — OXYCODONE HCL 10 MG PO TABS: 10.0000 mg | ORAL_TABLET | ORAL | 0 refills | Status: DC

## 2015-10-11 MED ORDER — ALPRAZOLAM 1 MG PO TABS: 1.0000 mg | ORAL_TABLET | Freq: Two times a day (BID) | ORAL | 0 refills | Status: DC | PRN

## 2015-10-11 NOTE — Progress Notes (Signed)
[place]  [phone] Fax:([phone]) [place] Note   Patient Care Team: Asencion Noble, MD as PCP - General (Internal Medicine) Daneil Dolin, MD as Attending Physician (Gastroenterology) 10/11/2015  Referral: Dawn Drazec   CHIEF COMPLAINTS/PURPOSE OF CONSULTATION:  Recurrent [place]    Hepatocellular carcinoma ([place])   04/18/2013 Initial Diagnosis    Hepatocellular carcinoma ([place])     04/28/2013 Procedure    Right TACE with lipiodol       06/15/2013 Procedure    Left TACE with [facility]       07/14/2013 Procedure    Right TACE with [facility]      09/05/2015 Imaging    CT abdomen with and without contrast showed a new 5.0 x 2.3 cm mass in the hepatic segment 8, invading and occluding the right anterior portal vein, most compatible with [place]. A portacaval node measuring 3.0 x 1.4 cm, previously 2.9 x 1.1 cm.       HISTORY OF PRESENTING ILLNESS (10/11/2015):  Kevin [place] 65 y.o. male with past medical history of treated hepatitis C, liver cirrhosis, and hepatocellular carcinoma is here because of recurrent hepatocellular carcinoma. He is accompanied by his significant other [name] and her sister.  He was diagnosed with hepatocellular carcinoma in 2015, his initial image result is not available and staging is unknown. He was seen by interventional radiologist Dr. Delice Lesch at [facility] in [place] and underwent TACE procedure 3 times in 2015. He was subsequently followed, and recently repeated CT scan showed a new 5.0 cm mass in segment 8, with portal vein invasion. He was felt not to be a candidate for further liver targeted therapy, and was referred to [place] for further systemic therapy.  He complains about mid epigastric pain for the past 2 years, which significantly improved after his TACE procedure in 2015. It has been getting worse lately in the past 6 months. He states he gets about 7-8 out of 10, persistent, he has been taking oxycodone 20 mg  every 6 hours, but his pain is not well controlled. He is quite fatigued, is low appetite. He last 40 lbs in the pat 4 months. He is able to function at home, in trying to remain to be physically active.  His hepatitis C was successfully treated. He has history of hepatitis C, complicated with ascites and encephalopathy, but it has been well controlled with medical management.   MEDICAL HISTORY:  Past Medical History:  Diagnosis Date  . Cholelithiasis   . Chronic hepatitis C   . Chronic knee pain   . Chronic left shoulder pain   . CVA (cerebral infarction)   . Gout   . Hepatitis C    genotype 1b.  pt has been vaccinated against hep A and B.  . Hypertension   . Osteoarthritis   . Schatzki's ring   . Tubular adenoma of colon 12/2011    SURGICAL HISTORY: Past Surgical History:  Procedure Laterality Date  . AMPUTATION Left 09/17/2012   Procedure: SMALL FINGER EXTENSOR TENDON REPAIR; METACARPAL LEVEL AMPUTATION RING FINGER; PROXIMAL PHALANX LEVEL AMPUTATION LONG FINGER;  Surgeon: Schuyler Amor, MD;  Location: [place];  Service: Orthopedics;  Laterality: Left;  . Arm surgery     right/plate in arm  . COLONOSCOPY WITH ESOPHAGOGASTRODUODENOSCOPY (EGD)  12/30/2011   RMR: Noncritical Schatzki's ring;  Hiatal hernia, Tubular ADENOMA removed from splenic flexure, otherwise normal colonoscopy  . LEG SURGERY     left  .  WOUND EXPLORATION Left 09/17/2012   Procedure: WOUND EXPLORATION;  Surgeon: Schuyler Amor, MD;  Location: [place];  Service: Orthopedics;  Laterality: Left;    SOCIAL HISTORY: Social History   Social History  . Marital status: Single    Spouse name: N/A  . Number of children: 0  . Years of education: N/A   Occupational History  . disabled    Social History Main Topics  . Smoking status: Former Smoker    Packs/day: 0.50    Years: 40.00    Types: Cigarettes    Quit date: 01/19/2012  . Smokeless tobacco: Not on file     Comment: Smokes 1/2 pack of cigarettes  daily  . Alcohol use No     Comment: HX 2 beers 3-4 days per week; QUIT DEC 2013  . Drug use: No     Comment: Hx cocaine yrs ago, Last marijuana OCT 2013  . Sexual activity: Not Currently    Partners: Female   Other Topics Concern  . Not on file   Social History Narrative   Lives w/ significant other, [name]    FAMILY HISTORY: Family History  Problem Relation Age of Onset  . Cirrhosis Father 75  . Lung cancer Mother 67  . Colon cancer Neg Hx     ALLERGIES:  has No Known Allergies.  MEDICATIONS:  Current Outpatient Prescriptions  Medication Sig Dispense Refill  . ALPRAZolam (XANAX) 1 MG tablet Take 1 mg by mouth at bedtime as needed for anxiety.    Marland Kitchen atenolol (TENORMIN) 50 MG tablet Take 25 mg by mouth daily.     . cyproheptadine (PERIACTIN) 4 MG tablet Take 4 mg by mouth every 6 (six) hours.     . docusate sodium (COLACE) 100 MG capsule Take 1 capsule (100 mg total) by mouth 2 (two) times daily as needed. 60 capsule 5  . Multiple Vitamins-Minerals (CENTRUM SILVER PO) Take by mouth.    . Ombitas-Paritapre-Ritona-Dasab (VIEKIRA PAK PO) Take by mouth. 250 mg   One pak daily         ( includes 3 pills in the morning and one pill at night)    . Oxycodone HCl 20 MG TABS Take 20 mg by mouth 4 (four) times daily.     Marland Kitchen oxyCODONE-acetaminophen (ROXICET) 5-325 MG per tablet Take 1 tablet by mouth every 4 (four) hours as needed for pain. 30 tablet 0  . PARoxetine (PAXIL) 20 MG tablet Take 20 mg by mouth every morning.     . polyethylene glycol (MIRALAX / GLYCOLAX) packet Take 17 g by mouth daily as needed (constipation). 30 each 11  . ribavirin (REBETOL) 200 MG capsule Take 200 mg by mouth 2 (two) times daily.     No current facility-administered medications for this visit.     REVIEW OF SYSTEMS:   Constitutional: Denies fevers, chills or abnormal night sweats Eyes: Denies blurriness of vision, double vision or watery eyes Ears, nose, mouth, throat, and face: Denies  mucositis or sore throat Respiratory: Denies cough, dyspnea or wheezes Cardiovascular: Denies palpitation, chest discomfort or lower extremity swelling Gastrointestinal:  Denies nausea, heartburn or change in bowel habits Skin: Denies abnormal skin rashes Lymphatics: Denies new lymphadenopathy or easy bruising Neurological:Denies numbness, tingling or new weaknesses Behavioral/Psych: Mood is stable, no new changes  All other systems were reviewed with the patient and are negative.  PHYSICAL EXAMINATION: ECOG PERFORMANCE STATUS: 1 - Symptomatic but completely ambulatory  Vitals:   10/11/15 1127  BP:  130/72  Pulse: 93  Resp: 18  Temp: 98.8 F (37.1 C)   Filed Weights   10/11/15 1127  Weight: 139 lb 6.4 oz (63.2 kg)   GENERAL:alert, in mild distress due to his pain SKIN: skin color, texture, turgor are normal, no rashes or significant lesions EYES: normal, conjunctiva are pink and non-injected, sclera clear OROPHARYNX:no exudate, no erythema and lips, buccal mucosa, and tongue normal  NECK: supple, thyroid normal size, non-tender, without nodularity LYMPH:  no palpable lymphadenopathy in the cervical, axillary or inguinal LUNGS: clear to auscultation and percussion with normal breathing effort HEART: regular rate & rhythm and no murmurs and no lower extremity edema ABDOMEN:abdomen soft, non-tender and normal bowel sounds, liver is enlarged, palpable 3-4cm below ribcage, mild tenderness, no significant ascites  Musculoskeletal:no cyanosis of digits and no clubbing  PSYCH: alert & oriented x 3 with fluent speech NEURO: no focal motor/sensory deficits  LABORATORY DATA:  I have reviewed the data as listed CBC Latest Ref Rng & Units 10/11/2015 09/17/2012 07/22/2011  WBC 4.0 - 10.3 10e3/uL 12.9(H) 9.8 6.4  Hemoglobin 13.0 - 17.1 g/dL 14.0 12.6(L) 13.8  Hematocrit 38.4 - 49.9 % 40.6 35.5(L) 41  Platelets 140 - 400 10e3/uL 201 143(L) -   CMP Latest Ref Rng & Units 10/11/2015 09/17/2012  12/16/2011  Glucose 70 - 140 mg/dl 88 145(H) -  BUN 7.0 - 26.0 mg/dL 16.7 16 -  Creatinine 0.7 - 1.3 mg/dL 1.0 0.99 -  Sodium 136 - 145 mEq/L 137 133(L) -  Potassium 3.5 - 5.1 mEq/L 4.8 4.2 -  Chloride 96 - 112 mEq/L - 97 -  CO2 22 - 29 mEq/L 20(L) 26 -  Calcium 8.4 - 10.4 mg/dL 9.6 8.8 -  Total Protein 6.4 - 8.3 g/dL 8.1 7.1 8.5(H)  Total Bilirubin 0.20 - 1.20 mg/dL 1.04 0.7 0.7  Alkaline Phos 40 - 150 U/L 165(H) 90 130(H)  AST 5 - 34 U/L 41(H) 86(H) 50(H)  ALT 0 - 55 U/L 38 62(H) 44    RADIOGRAPHIC STUDIES: I have personally reviewed the radiological images as listed and agreed with the findings in the report. No results found.   Abdominal MRI with and without contrast 04/16/2013 IMPRESSION: 1. The previously ring enhancing lesion in the cephalad portion of segment 8 is poorly seen on today's exam due to breathing motion artifact. It may be minimally smaller but remains suspicious for hepatocellular carcinoma. 2. The slightly more caudad lesion in segment 8 has enlarged from 1.9 x 1.6 cm to 2.4 x 2.7 cm. There is also a suspected adjacent segment 4A lesion measuring 1.3 x 2.0 cm, increased in conspicuity compared to prior, suspicious for hepatic cellular carcinoma. 3. Subtle anterior arterial phase enhancement at the junction of segment 8 and 4A, 2.1 x 1.0 cm, likewise suspicious for hepatocellular carcinoma.  I reviewed his outside CT abdomen report from 09/05/2015, see HPI   ASSESSMENT & PLAN: 65 year old Caucasian male with past medical history of successfully treated hepatitis C, liver cirrhosis, history of ascites and encephalopathy, recurrent HCC  1. Recurrent HCC, initially stage II -I reviewed his previous image and outside medical records extensively, confirmed key findings with patient and his family members -He initially had a multifocal disease, status post TACE 3 times in 2015 -he now has developed symptomatic local recurrence, with a 5cm new lesion in segment  8 with direct invasion into portal vein, and a portocaval lymph node -I'll try to obtain his outside CT scan image, to review in our  GI tumor board, to see if he is a candidate for Y90 -Giving the positive portocaval lymph node, I think systemic therapy is probably a better option. -I'll obtain a CT chest without contrast to rule out a pulmonary metastasis -He has good liver function (Child-Pugh A), likely to be a candidate for systemic therapy. I discussed the treatment option of sorafenib, regorafenib and immunotherapy with nivolumab -I recommend him to try sorafenib first, likely to start in 2-3 weeks when his pain is better controlled  -The potential benefit and side effects of sorafenib, which includes but are not limited to, fatigue, nausea, gastric discomfort, diarrhea, anorexia, skin rash, abnormal liver function, bleeding, perforation, hypertension, etc. were discussed with patient and his family members in details, he agrees to proceed -I'll send a prescription to specialty pharmacy  2. 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 Hep C, successfully treated -Continue follow-up with Dawn  4. Abdominal pain -Secondary to [facility] -He is currently on oxycodone 20 mg every 5-6 hours. I'll add OxyContin 30 mg every 12 hours, and he can use oxycodone 10-20 mg every 4 hours as needed for breakthrough pain. Prescriptions were given to him today.  Plan -lab today -CT chest wo contrast in 1-2 weeks -he will start oxydontin '30mg'$  q12hr, and oxycodone 10-'20mg'$  every 4 hrs for his pain -RTC in 2 weeks  -I will send prescription of sorafenib to specialty pharmacy  All questions were answered. The patient knows to call the clinic with any problems, questions or concerns. I spent 55 minutes  counseling the patient face to face. The total time spent in the appointment was 60 minutes and more than 50% was on counseling.     Truitt Merle, MD 10/11/2015 6:39 AM

## 2015-10-11 NOTE — Telephone Encounter (Signed)
Pharmacy does not need input from Dr. Burr Medico

## 2015-10-11 NOTE — Telephone Encounter (Signed)
Avs report and appointment schedule given to patient, per 10/11/15 los.

## 2015-10-12 ENCOUNTER — Encounter: Payer: Self-pay | Admitting: Hematology

## 2015-10-12 LAB — AFP TUMOR MARKER: AFP, Serum, Tumor Marker: 5.3 ng/mL (ref 0.0–8.3)

## 2015-10-14 ENCOUNTER — Telehealth: Payer: Self-pay | Admitting: *Deleted

## 2015-10-14 NOTE — Telephone Encounter (Signed)
Oncology Nurse Navigator Documentation  Oncology Nurse Navigator Flowsheets 10/14/2015  Navigator Location [facility]  Navigator Encounter Type Telephone;Letter/Fax/Email  Telephone Outgoing Call;Diagnostic Results  Interventions Other  Time Spent with Patient 30  After multiple calls to liver clinic in [place], liver clinic and [facility] and [facility] was able to confirm his CT abd/pelvis was done there on 09/05/15. They do not [employer] with [facility]. Need a faxed request with [employer] account # to overnight the CD. Faxed this info to [phone] as requested.

## 2015-10-15 ENCOUNTER — Telehealth: Payer: Self-pay | Admitting: *Deleted

## 2015-10-15 NOTE — Telephone Encounter (Signed)
Prior auth sent to cover my meds for oxycontin 30 mg tab. Pending review

## 2015-10-16 ENCOUNTER — Other Ambulatory Visit: Payer: Self-pay | Admitting: Hematology

## 2015-10-16 ENCOUNTER — Inpatient Hospital Stay
Admission: RE | Admit: 2015-10-16 | Discharge: 2015-10-16 | Disposition: A | Discharge status: Home / Self Care | Payer: Self-pay | Source: Ambulatory Visit | Attending: Hematology | Admitting: Hematology

## 2015-10-16 DIAGNOSIS — C220 Liver cell carcinoma: Secondary | ICD-10-CM

## 2015-10-16 NOTE — Telephone Encounter (Signed)
Authorization approved for Oxycontin- [id number] expires 01/19/2016

## 2015-10-17 ENCOUNTER — Telehealth: Payer: Self-pay | Admitting: *Deleted

## 2015-10-17 ENCOUNTER — Other Ambulatory Visit: Payer: Self-pay | Admitting: Hematology

## 2015-10-17 DIAGNOSIS — Z23 Encounter for immunization: Secondary | ICD-10-CM | POA: Diagnosis not present

## 2015-10-17 DIAGNOSIS — C220 Liver cell carcinoma: Secondary | ICD-10-CM

## 2015-10-17 MED ORDER — MORPHINE SULFATE ER 30 MG PO TBCR: 30.0000 mg | EXTENDED_RELEASE_TABLET | Freq: Two times a day (BID) | ORAL | 0 refills | Status: DC

## 2015-10-17 MED ORDER — OXYCODONE HCL 10 MG PO TABS: 10.0000 mg | ORAL_TABLET | ORAL | 0 refills | Status: DC

## 2015-10-17 NOTE — Telephone Encounter (Signed)
"  I need help.  The Pharmacy is having a hard time getting the prior authorization information for [place] to get the pain medicine.  I've been trying to get this since 10-11-2015.  He has cancer and is in pain.  He takes the other every three hours he hurts so bad and will run out soon.  Can someone from the office call [facility] on [address]." This nurse called.  Per pharmacist.  Prior authorization is needed for Oxycontin 30 mg ER not IR.  Drug replacement Specialist and Provider notified.  New prescriptions ordered pending prior authorization.

## 2015-10-22 DIAGNOSIS — M79609 Pain in unspecified limb: Secondary | ICD-10-CM | POA: Diagnosis not present

## 2015-10-22 DIAGNOSIS — Z79899 Other long term (current) drug therapy: Secondary | ICD-10-CM | POA: Diagnosis not present

## 2015-10-22 DIAGNOSIS — M545 Low back pain: Secondary | ICD-10-CM | POA: Diagnosis not present

## 2015-10-22 DIAGNOSIS — K7460 Unspecified cirrhosis of liver: Secondary | ICD-10-CM | POA: Diagnosis not present

## 2015-10-23 ENCOUNTER — Encounter: Payer: Self-pay | Admitting: *Deleted

## 2015-10-25 ENCOUNTER — Other Ambulatory Visit: Payer: Self-pay | Admitting: *Deleted

## 2015-10-25 ENCOUNTER — Ambulatory Visit (HOSPITAL_BASED_OUTPATIENT_CLINIC_OR_DEPARTMENT_OTHER): Payer: Medicare Other | Admitting: Hematology

## 2015-10-25 ENCOUNTER — Telehealth: Payer: Self-pay | Admitting: *Deleted

## 2015-10-25 ENCOUNTER — Encounter: Payer: Self-pay | Admitting: Hematology

## 2015-10-25 VITALS — BP 143/72 | HR 95 | Temp 98.4°F | Resp 17 | Ht 73.0 in | Wt 146.3 lb

## 2015-10-25 DIAGNOSIS — K7460 Unspecified cirrhosis of liver: Secondary | ICD-10-CM | POA: Insufficient documentation

## 2015-10-25 DIAGNOSIS — K7469 Other cirrhosis of liver: Secondary | ICD-10-CM | POA: Diagnosis not present

## 2015-10-25 DIAGNOSIS — R109 Unspecified abdominal pain: Secondary | ICD-10-CM | POA: Diagnosis not present

## 2015-10-25 DIAGNOSIS — G893 Neoplasm related pain (acute) (chronic): Secondary | ICD-10-CM

## 2015-10-25 DIAGNOSIS — Z8619 Personal history of other infectious and parasitic diseases: Secondary | ICD-10-CM

## 2015-10-25 DIAGNOSIS — C220 Liver cell carcinoma: Secondary | ICD-10-CM | POA: Diagnosis not present

## 2015-10-25 MED ORDER — OXYCODONE HCL 10 MG PO TABS: ORAL_TABLET | ORAL | 0 refills | Status: DC

## 2015-10-25 MED ORDER — OXYCODONE HCL ER 30 MG PO T12A: 60.0000 mg | EXTENDED_RELEASE_TABLET | Freq: Two times a day (BID) | ORAL | 0 refills | Status: DC

## 2015-10-25 MED FILL — OxyCONTIN 30 MG T12A: 30 | 30 days supply | Qty: 120 | Fill #0

## 2015-10-25 MED FILL — oxyCODONE HCL 10 MG TABS: 10 | 45 days supply | Qty: 180 | Fill #0

## 2015-10-25 NOTE — Telephone Encounter (Signed)
Call received @ [facility] wanted to know if [facility] [place] appt for today @ 230 could be moved to an eariler time today due to him being in pain

## 2015-10-25 NOTE — Progress Notes (Signed)
[facility]  [phone] [phone]  Clinic Follow up Note   Patient Care Team: Asencion Noble, MD as PCP - General (Internal Medicine) Daneil Dolin, MD as Attending Physician (Gastroenterology) Antonietta Jewel, MD as Referring Physician (Internal Medicine) Truitt Merle, MD as Consulting Physician (Hematology) [name], CRNP as Nurse Practitioner (Nurse Practitioner) 10/25/2015   CHIEF COMPLAINTS:  Abdominal pain   Oncology History   Hepatocellular carcinoma [facility])   Staging form: Liver (Excluding Intrahepatic Bile Ducts), AJCC 7th Edition   - Clinical stage from 04/16/2012: Stage II (T2(m), N0, M0) - Signed by Truitt Merle, MD on 10/12/2015   - Pathologic stage from 04/16/2013: Stage II (T2, N0, cM0) - Signed by Truitt Merle, MD on 10/12/2015      Hepatocellular carcinoma ([place])   09/14/2012 Tumor Marker    AFP 28.9      04/16/2013 Imaging    Abdominal MRI with and without contrast reviewed multifocal (4) liver lesions in both left and right lobes, most consistent with HCC, largest measuring 2.7 cm      04/16/2013 Initial Diagnosis    Hepatocellular carcinoma ([place])      04/28/2013 Procedure    Right TACE with lipiodol       06/15/2013 Procedure    Left TACE with [facility]       07/14/2013 Procedure    Right TACE with [facility]      09/05/2015 Imaging    CT abdomen with and without contrast showed a new 5.0 x 2.3 cm mass in the hepatic segment 8, invading and occluding the right anterior portal vein, most compatible with [place]. A portacaval node measuring 3.0 x 1.4 cm, previously 2.9 x 1.1 cm.      10/11/2015 Tumor Marker    AFP 5.3       HISTORY OF PRESENTING ILLNESS (10/11/2015):  Kevin Dillon 65 y.o. male with past medical history of treated hepatitis C, liver cirrhosis, and hepatocellular carcinoma is here because of recurrent hepatocellular carcinoma. He is accompanied by his significant other Kevin Dillon and her sister.  He was diagnosed with hepatocellular  carcinoma in 2015, his initial image result is not available and staging is unknown. He was seen by interventional radiologist Dr. Delice Lesch at [facility] in [place] and underwent TACE procedure 3 times in 2015. He was subsequently followed, and recently repeated CT scan showed a new 5.0 cm mass in segment 8, with portal vein invasion. He was felt not to be a candidate for further liver targeted therapy, and was referred to [place] for further systemic therapy.  He complains about mid epigastric pain for the past 2 years, which significantly improved after his TACE procedure in 2015. It has been getting worse lately in the past 6 months. He states he gets about 7-8 out of 10, persistent, he has been taking oxycodone 20 mg every 6 hours, but his pain is not well controlled. He is quite fatigued, is low appetite. He last 40 lbs in the pat 4 months. He is able to function at home, in trying to remain to be physically active.  His hepatitis C was successfully treated. He has history of hepatitis C, complicated with ascites and encephalopathy, but it has been well controlled with medical management.   CURRENT THERAPY: Pending   INTERIM HISTORY: Kevin Dillon returns for follow up and discuss his pain medication. Both patient and his wife has been very frustrated about his pain medication. The prescription of OxyContin I given him last time  needed to preauthorization from his insurance, it was approved after a few days. When he was waiting for the preauthorization, I given the amount alternative prescription of MS Contin, and he has been taking MS Contin 30 mg twice daily, and oxycodone '40mg'$  ('10mg'$  4 tab) every 4-6 hours in the past week for his worsening and debilitating pain. When his pain is severe, he also has nausea, vomiting and diarrhea. He has not been able to sleep well, his appetite and any level are very low. No other new complaints.  MEDICAL HISTORY:  Past Medical History:  Diagnosis Date  .  Cholelithiasis   . Chronic hepatitis C ([place])   . Chronic knee pain   . Chronic left shoulder pain   . CVA (cerebral infarction)   . Gout   . Hepatitis C    genotype 1b.  pt has been vaccinated against hep A and B.  . Hypertension   . Osteoarthritis   . Schatzki's ring   . Tubular adenoma of colon 12/2011    SURGICAL HISTORY: Past Surgical History:  Procedure Laterality Date  . AMPUTATION Left 09/17/2012   Procedure: SMALL FINGER EXTENSOR TENDON REPAIR; METACARPAL LEVEL AMPUTATION RING FINGER; PROXIMAL PHALANX LEVEL AMPUTATION LONG FINGER;  Surgeon: Schuyler Amor, MD;  Location: [place];  Service: Orthopedics;  Laterality: Left;  . Arm surgery     right/plate in arm  . COLONOSCOPY WITH ESOPHAGOGASTRODUODENOSCOPY (EGD)  12/30/2011   RMR: Noncritical Schatzki's ring;  Hiatal hernia, Tubular ADENOMA removed from splenic flexure, otherwise normal colonoscopy  . LEG SURGERY     left  . WOUND EXPLORATION Left 09/17/2012   Procedure: WOUND EXPLORATION;  Surgeon: Schuyler Amor, MD;  Location: [place];  Service: Orthopedics;  Laterality: Left;    SOCIAL HISTORY: Social History   Social History  . Marital status: Single    Spouse name: N/A  . Number of children: 0  . Years of education: N/A   Occupational History  . disabled    Social History Main Topics  . Smoking status: Former Smoker    Packs/day: 0.50    Years: 40.00    Types: Cigarettes    Quit date: 01/19/2012  . Smokeless tobacco: Never Used     Comment: Smokes 1/2 pack of cigarettes daily  . Alcohol use No     Comment: HX 2 beers 3-4 days per week; QUIT DEC 2013  . Drug use: No     Comment: Hx cocaine yrs ago, Last marijuana OCT 2013  . Sexual activity: Not Currently    Partners: Female   Other Topics Concern  . Not on file   Social History Narrative   Lives w/ significant other, [name]    FAMILY HISTORY: Family History  Problem Relation Age of Onset  . Cirrhosis Father 34  . Cancer Father       liver cancer   . Lung cancer Mother 26  . Colon cancer Neg Hx     ALLERGIES:  has No Known Allergies.  MEDICATIONS:  Current Outpatient Prescriptions  Medication Sig Dispense Refill  . ALPRAZolam (XANAX) 1 MG tablet Take 1 tablet (1 mg total) by mouth 2 (two) times daily as needed for anxiety. 60 tablet 0  . docusate sodium (COLACE) 100 MG capsule Take 1 capsule (100 mg total) by mouth 2 (two) times daily as needed. 60 capsule 5  . hydrALAZINE (APRESOLINE) 25 MG tablet     . megestrol (MEGACE) 40 MG/ML suspension     .  morphine (MS CONTIN) 30 MG 12 hr tablet Take 1 tablet (30 mg total) by mouth every 12 (twelve) hours. 60 tablet 0  . Multiple Vitamins-Minerals (CENTRUM SILVER PO) Take by mouth.    . Oxycodone HCl 10 MG TABS 10 to 30 mg every 4 hours prn pain 180 tablet 0  . Oxycodone HCl 20 MG TABS Take 20 mg by mouth 4 (four) times daily.     Marland Kitchen PARoxetine (PAXIL) 20 MG tablet Take 20 mg by mouth every morning.     . polyethylene glycol (MIRALAX / GLYCOLAX) packet Take 17 g by mouth daily as needed (constipation). (Patient not taking: Reported on 10/11/2015) 30 each 11   No current facility-administered medications for this visit.     REVIEW OF SYSTEMS:   Constitutional: Denies fevers, chills or abnormal night sweats Eyes: Denies blurriness of vision, double vision or watery eyes Ears, nose, mouth, throat, and face: Denies mucositis or sore throat Respiratory: Denies cough, dyspnea or wheezes Cardiovascular: Denies palpitation, chest discomfort or lower extremity swelling Gastrointestinal:  Denies nausea, heartburn or change in bowel habits Skin: Denies abnormal skin rashes Lymphatics: Denies new lymphadenopathy or easy bruising Neurological:Denies numbness, tingling or new weaknesses Behavioral/Psych: Mood is stable, no new changes  All other systems were reviewed with the patient and are negative.  PHYSICAL EXAMINATION: ECOG PERFORMANCE STATUS: 2-3   Vitals:   10/25/15  1340  BP: (!) 143/72  Pulse: 95  Resp: 17  Temp: 98.4 F (36.9 C)   Filed Weights   10/25/15 1340  Weight: 146 lb 4.8 oz (66.4 kg)   GENERAL:alert, in mild distress due to his pain SKIN: skin color, texture, turgor are normal, no rashes or significant lesions EYES: normal, conjunctiva are pink and non-injected, sclera clear OROPHARYNX:no exudate, no erythema and lips, buccal mucosa, and tongue normal  NECK: supple, thyroid normal size, non-tender, without nodularity LYMPH:  no palpable lymphadenopathy in the cervical, axillary or inguinal LUNGS: clear to auscultation and percussion with normal breathing effort HEART: regular rate & rhythm and no murmurs and no lower extremity edema ABDOMEN:abdomen soft, non-tender and normal bowel sounds, liver is enlarged, palpable 3-4cm below ribcage, mild tenderness, no significant ascites  Musculoskeletal:no cyanosis of digits and no clubbing  PSYCH: alert & oriented x 3 with fluent speech NEURO: no focal motor/sensory deficits  LABORATORY DATA:  I have reviewed the data as listed CBC Latest Ref Rng & Units 10/11/2015 09/17/2012 07/22/2011  WBC 4.0 - 10.3 10e3/uL 12.9(H) 9.8 6.4  Hemoglobin 13.0 - 17.1 g/dL 14.0 12.6(L) 13.8  Hematocrit 38.4 - 49.9 % 40.6 35.5(L) 41  Platelets 140 - 400 10e3/uL 201 143(L) -   CMP Latest Ref Rng & Units 10/11/2015 09/17/2012 12/16/2011  Glucose 70 - 140 mg/dl 88 145(H) -  BUN 7.0 - 26.0 mg/dL 16.7 16 -  Creatinine 0.7 - 1.3 mg/dL 1.0 0.99 -  Sodium 136 - 145 mEq/L 137 133(L) -  Potassium 3.5 - 5.1 mEq/L 4.8 4.2 -  Chloride 96 - 112 mEq/L - 97 -  CO2 22 - 29 mEq/L 20(L) 26 -  Calcium 8.4 - 10.4 mg/dL 9.6 8.8 -  Total Protein 6.4 - 8.3 g/dL 8.1 7.1 8.5(H)  Total Bilirubin 0.20 - 1.20 mg/dL 1.04 0.7 0.7  Alkaline Phos 40 - 150 U/L 165(H) 90 130(H)  AST 5 - 34 U/L 41(H) 86(H) 50(H)  ALT 0 - 55 U/L 38 62(H) 44    RADIOGRAPHIC STUDIES: I have personally reviewed the radiological images as listed  and agreed  with the findings in the report. No results found.   Abdominal MRI with and without contrast 04/16/2013 IMPRESSION: 1. The previously ring enhancing lesion in the cephalad portion of segment 8 is poorly seen on today's exam due to breathing motion artifact. It may be minimally smaller but remains suspicious for hepatocellular carcinoma. 2. The slightly more caudad lesion in segment 8 has enlarged from 1.9 x 1.6 cm to 2.4 x 2.7 cm. There is also a suspected adjacent segment 4A lesion measuring 1.3 x 2.0 cm, increased in conspicuity compared to prior, suspicious for hepatic cellular carcinoma. 3. Subtle anterior arterial phase enhancement at the junction of segment 8 and 4A, 2.1 x 1.0 cm, likewise suspicious for hepatocellular carcinoma.  I reviewed his outside CT abdomen report from 09/05/2015, see HPI   ASSESSMENT & PLAN: 65 year old Caucasian male with past medical history of successfully treated hepatitis C, liver cirrhosis, history of ascites and encephalopathy, recurrent HCC  1. Abdominal pain -Secondary to [facility] -He did not respond well to MS Contin, OxyContin has been approved by his insurance, I give him a new prescription of OxyContin 30 mg, 2 tablets every 12 hours, and oxycodone 10 mg, 1-3 tablets every 4-6 hours as needed for his pain control. -He seems to have very high tolerance to necrotic -Constipation management were reviewed with patient again -I'll see him back next week to titrate his pain medications again  2. Recurrent HCC, initially stage II -I reviewed his previous image and outside medical records extensively, confirmed key findings with patient and his family members -He initially had a multifocal disease, status post TACE 3 times in 2015 -he now has developed symptomatic local recurrence, with a 5cm new lesion in segment 8 with direct invasion into portal vein, and a portocaval lymph node -I'll try to obtain his outside CT scan image, to review in our GI tumor  board, to see if he is a candidate for Y90 -Giving the positive portocaval lymph node, I think systemic therapy is probably a better option. -I'll obtain a CT chest without contrast to rule out a pulmonary metastasis -He has good liver function (Child-Pugh A), likely to be a candidate for systemic therapy. I discussed the treatment option of sorafenib, regorafenib and immunotherapy with nivolumab -I recommend him to try sorafenib first, likely to start in 2-3 weeks when his pain is better controlled  -The potential benefit and side effects of sorafenib, which includes but are not limited to, fatigue, nausea, gastric discomfort, diarrhea, anorexia, skin rash, abnormal liver function, bleeding, perforation, hypertension, etc. were discussed with patient and his family members in details, he agrees to proceed -I'll send a prescription to specialty pharmacy, he has not received medication yet  3. 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 Hep C, successfully treated -Continue follow-up with [facility] -My nurse Janifer Adie called his insurance, spent quite a bit time to ensure that both oxycodone and OxyContin are approved by his insurance -I give him 2 prescriptions of oxycodone and OxyContin, and asked him to fill at [facility] today -I will see him back in one week   All questions were answered. The patient knows to call the clinic with any problems, questions or concerns. I spent 25 minutes counseling the patient face to face. The total time spent  in the appointment was 35 minutes and more than 50% was on counseling.     Truitt Merle, MD 10/25/2015

## 2015-10-25 NOTE — Telephone Encounter (Signed)
OK, I sent a message to scheduler   Truitt Merle MD

## 2015-10-27 ENCOUNTER — Telehealth: Payer: Self-pay | Admitting: Hematology

## 2015-10-27 NOTE — Telephone Encounter (Signed)
S/w pt's wife, gave appt for 10/13 @ 2pm.

## 2015-11-01 ENCOUNTER — Encounter: Payer: Self-pay | Admitting: Pharmacist

## 2015-11-01 ENCOUNTER — Other Ambulatory Visit: Payer: Self-pay | Admitting: *Deleted

## 2015-11-01 ENCOUNTER — Telehealth: Payer: Self-pay | Admitting: Hematology

## 2015-11-01 ENCOUNTER — Other Ambulatory Visit (HOSPITAL_BASED_OUTPATIENT_CLINIC_OR_DEPARTMENT_OTHER): Payer: Medicare Other

## 2015-11-01 ENCOUNTER — Ambulatory Visit (HOSPITAL_BASED_OUTPATIENT_CLINIC_OR_DEPARTMENT_OTHER): Payer: Medicare Other | Admitting: Hematology

## 2015-11-01 VITALS — BP 152/59 | HR 88 | Temp 98.8°F | Resp 17 | Ht 73.0 in | Wt 152.7 lb

## 2015-11-01 DIAGNOSIS — C220 Liver cell carcinoma: Secondary | ICD-10-CM

## 2015-11-01 DIAGNOSIS — Z8619 Personal history of other infectious and parasitic diseases: Secondary | ICD-10-CM | POA: Diagnosis not present

## 2015-11-01 DIAGNOSIS — K7469 Other cirrhosis of liver: Secondary | ICD-10-CM

## 2015-11-01 DIAGNOSIS — G893 Neoplasm related pain (acute) (chronic): Secondary | ICD-10-CM

## 2015-11-01 LAB — COMPREHENSIVE METABOLIC PANEL
ALT: 26 U/L (ref 0–55)
AST: 37 U/L — AB (ref 5–34)
Albumin: 3.1 g/dL — ABNORMAL LOW (ref 3.5–5.0)
Alkaline Phosphatase: 151 U/L — ABNORMAL HIGH (ref 40–150)
Anion Gap: 7 mEq/L (ref 3–11)
BUN: 12.6 mg/dL (ref 7.0–26.0)
CHLORIDE: 107 meq/L (ref 98–109)
CO2: 26 meq/L (ref 22–29)
CREATININE: 1.1 mg/dL (ref 0.7–1.3)
Calcium: 8.9 mg/dL (ref 8.4–10.4)
EGFR: 73 mL/min/{1.73_m2} — ABNORMAL LOW (ref >90)
Glucose: 100 mg/dl (ref 70–140)
Potassium: 4.1 mEq/L (ref 3.5–5.1)
SODIUM: 140 meq/L (ref 136–145)
Total Bilirubin: 0.43 mg/dL (ref 0.20–1.20)
Total Protein: 7.1 g/dL (ref 6.4–8.3)

## 2015-11-01 LAB — CBC WITH DIFFERENTIAL/PLATELET
BASO%: 0.4 % (ref 0.0–2.0)
Basophils Absolute: 0 10*3/uL (ref 0.0–0.1)
EOS%: 1.1 % (ref 0.0–7.0)
Eosinophils Absolute: 0.1 10*3/uL (ref 0.0–0.5)
HCT: 35.9 % — ABNORMAL LOW (ref 38.4–49.9)
HGB: 12 g/dL — ABNORMAL LOW (ref 13.0–17.1)
LYMPH#: 2.3 10*3/uL (ref 0.9–3.3)
LYMPH%: 25.8 % (ref 14.0–49.0)
MCH: 30.9 pg (ref 27.2–33.4)
MCHC: 33.4 g/dL (ref 32.0–36.0)
MCV: 92.6 fL (ref 79.3–98.0)
MONO#: 1 10*3/uL — ABNORMAL HIGH (ref 0.1–0.9)
MONO%: 11.5 % (ref 0.0–14.0)
NEUT#: 5.5 10*3/uL (ref 1.5–6.5)
NEUT%: 61.2 % (ref 39.0–75.0)
Platelets: 193 10*3/uL (ref 140–400)
RBC: 3.88 10*6/uL — AB (ref 4.20–5.82)
RDW: 13.6 % (ref 11.0–14.6)
WBC: 8.9 10*3/uL (ref 4.0–10.3)

## 2015-11-01 MED ORDER — SORAFENIB TOSYLATE 200 MG PO TABS: 400.0000 mg | ORAL_TABLET | Freq: Two times a day (BID) | ORAL | 0 refills | Status: DC

## 2015-11-01 MED ORDER — PROCHLORPERAZINE MALEATE 10 MG PO TABS: 10.0000 mg | ORAL_TABLET | Freq: Three times a day (TID) | ORAL | 1 refills | Status: DC | PRN

## 2015-11-01 MED ORDER — OXYCODONE HCL 20 MG PO TABS: 1.0000 | ORAL_TABLET | ORAL | 0 refills | Status: DC | PRN

## 2015-11-01 MED FILL — oxyCODONE HCL 20 MG TABS: 20 | 35 days supply | Qty: 140 | Fill #0

## 2015-11-01 MED FILL — PROCHLORPERAZINE 10 MG TAB: 10 | 10 days supply | Qty: 30 | Fill #0

## 2015-11-01 NOTE — Progress Notes (Signed)
Oral Chemotherapy Pharmacist Encounter  Received prescription for sorafenib '400mg'$  BID. Labs from 10/13 reviewed, ok for treatment. Current medication list in Epic reviewed, no significant DDI's with sorafenib identified. Risk B DDI between sorafenib and paroxitine noted due to risk of increased QTc, however no action is required.  Prescription will be sent to [facility] for benefits analysis.  Oral Chemo Clinic will continue to follow.  Johny Drilling, PharmD, BCPS 11/01/2015  3:41 PM Oral Chemotherapy Clinic [phone]

## 2015-11-01 NOTE — Progress Notes (Signed)
[place]  [phone] [phone]  Clinic Follow up Note   Patient Care Team: Asencion Noble, MD as PCP - General (Internal Medicine) Daneil Dolin, MD as Attending Physician (Gastroenterology) Antonietta Jewel, MD as Referring Physician (Internal Medicine) Truitt Merle, MD as Consulting Physician (Hematology) [name], CRNP as Nurse Practitioner (Nurse Practitioner) 11/01/2015   CHIEF COMPLAINTS:  Abdominal pain   Oncology History   Hepatocellular carcinoma ([place])   Staging form: Liver (Excluding Intrahepatic Bile Ducts), AJCC 7th Edition   - Clinical stage from 04/16/2012: Stage II (T2(m), N0, M0) - Signed by Truitt Merle, MD on 10/12/2015   - Pathologic stage from 04/16/2013: Stage II (T2, N0, cM0) - Signed by Truitt Merle, MD on 10/12/2015      Hepatocellular carcinoma ([place])   09/14/2012 Tumor Marker    AFP 28.9      04/16/2013 Imaging    Abdominal MRI with and without contrast reviewed multifocal (4) liver lesions in both left and right lobes, most consistent with HCC, largest measuring 2.7 cm      04/16/2013 Initial Diagnosis    Hepatocellular carcinoma ([place])      04/28/2013 Procedure    Right TACE with lipiodol       06/15/2013 Procedure    Left TACE with [facility]       07/14/2013 Procedure    Right TACE with [facility]      09/05/2015 Imaging    CT abdomen with and without contrast showed a new 5.0 x 2.3 cm mass in the hepatic segment 8, invading and occluding the right anterior portal vein, most compatible with [place]. A portacaval node measuring 3.0 x 1.4 cm, previously 2.9 x 1.1 cm.      10/11/2015 Tumor Marker    AFP 5.3       HISTORY OF PRESENTING ILLNESS (10/11/2015):  Kevin Dillon 65 y.o. male with past medical history of treated hepatitis C, liver cirrhosis, and hepatocellular carcinoma is here because of recurrent hepatocellular carcinoma. He is accompanied by his significant other Kevin Dillon and her sister.  He was diagnosed with hepatocellular  carcinoma in 2015, his initial image result is not available and staging is unknown. He was seen by interventional radiologist Dr. Delice Lesch at [facility] in [place] and underwent TACE procedure 3 times in 2015. He was subsequently followed, and recently repeated CT scan showed a new 5.0 cm mass in segment 8, with portal vein invasion. He was felt not to be a candidate for further liver targeted therapy, and was referred to [place] for further systemic therapy.  He complains about mid epigastric pain for the past 2 years, which significantly improved after his TACE procedure in 2015. It has been getting worse lately in the past 6 months. He states he gets about 7-8 out of 10, persistent, he has been taking oxycodone 20 mg every 6 hours, but his pain is not well controlled. He is quite fatigued, is low appetite. He last 40 lbs in the pat 4 months. He is able to function at home, in trying to remain to be physically active.  His hepatitis C was successfully treated. He has history of hepatitis C, complicated with ascites and encephalopathy, but it has been well controlled with medical management.   CURRENT THERAPY: Pending sorafenib   INTERIM HISTORY: Kevin Dillon returns for follow up and pain medication adjustment. He has been taking OxyContin 60 mg twice daily, oxycodone 30-40 mg every 2-3 hours since last week, he states his pain  is moderately controlled, much better than last week,  he is not sedated, constipation is controlled. He has intermittent nausea, and moderate twice in the past week. His appetite is fair when he did not have nausea or vomiting. He has gained some weight lately.   MEDICAL HISTORY:  Past Medical History:  Diagnosis Date  . Cholelithiasis   . Chronic hepatitis C ([place])   . Chronic knee pain   . Chronic left shoulder pain   . CVA (cerebral infarction)   . Gout   . Hepatitis C    genotype 1b.  pt has been vaccinated against hep A and B.  . Hypertension   . Osteoarthritis   .  Schatzki's ring   . Tubular adenoma of colon 12/2011    SURGICAL HISTORY: Past Surgical History:  Procedure Laterality Date  . AMPUTATION Left 09/17/2012   Procedure: SMALL FINGER EXTENSOR TENDON REPAIR; METACARPAL LEVEL AMPUTATION RING FINGER; PROXIMAL PHALANX LEVEL AMPUTATION LONG FINGER;  Surgeon: Schuyler Amor, MD;  Location: [place];  Service: Orthopedics;  Laterality: Left;  . Arm surgery     right/plate in arm  . COLONOSCOPY WITH ESOPHAGOGASTRODUODENOSCOPY (EGD)  12/30/2011   RMR: Noncritical Schatzki's ring;  Hiatal hernia, Tubular ADENOMA removed from splenic flexure, otherwise normal colonoscopy  . LEG SURGERY     left  . WOUND EXPLORATION Left 09/17/2012   Procedure: WOUND EXPLORATION;  Surgeon: Schuyler Amor, MD;  Location: [place];  Service: Orthopedics;  Laterality: Left;    SOCIAL HISTORY: Social History   Social History  . Marital status: Single    Spouse name: N/A  . Number of children: 0  . Years of education: N/A   Occupational History  . disabled    Social History Main Topics  . Smoking status: Former Smoker    Packs/day: 0.50    Years: 40.00    Types: Cigarettes    Quit date: 01/19/2012  . Smokeless tobacco: Never Used     Comment: Smokes 1/2 pack of cigarettes daily  . Alcohol use No     Comment: HX 2 beers 3-4 days per week; QUIT DEC 2013  . Drug use: No     Comment: Hx cocaine yrs ago, Last marijuana OCT 2013  . Sexual activity: Not Currently    Partners: Female   Other Topics Concern  . Not on file   Social History Narrative   Lives w/ significant other, Kevin Dillon    FAMILY HISTORY: Family History  Problem Relation Age of Onset  . Cirrhosis Father 79  . Cancer Father     liver cancer   . Lung cancer Mother 51  . Colon cancer Neg Hx     ALLERGIES:  has No Known Allergies.  MEDICATIONS:  Current Outpatient Prescriptions  Medication Sig Dispense Refill  . ALPRAZolam (XANAX) 1 MG tablet Take 1 tablet (1 mg total) by  mouth 2 (two) times daily as needed for anxiety. 60 tablet 0  . docusate sodium (COLACE) 100 MG capsule Take 1 capsule (100 mg total) by mouth 2 (two) times daily as needed. 60 capsule 5  . hydrALAZINE (APRESOLINE) 25 MG tablet     . megestrol (MEGACE) 40 MG/ML suspension     . morphine (MS CONTIN) 30 MG 12 hr tablet Take 1 tablet (30 mg total) by mouth every 12 (twelve) hours. 60 tablet 0  . Multiple Vitamins-Minerals (CENTRUM SILVER PO) Take by mouth.    . Oxycodone HCl 10 MG TABS 10 to 30  mg every 4 hours prn pain 180 tablet 0  . Oxycodone HCl 20 MG TABS Take 20 mg by mouth 4 (four) times daily.     Marland Kitchen PARoxetine (PAXIL) 20 MG tablet Take 20 mg by mouth every morning.     . polyethylene glycol (MIRALAX / GLYCOLAX) packet Take 17 g by mouth daily as needed (constipation). (Patient not taking: Reported on 10/11/2015) 30 each 11   No current facility-administered medications for this visit.     REVIEW OF SYSTEMS:   Constitutional: Denies fevers, chills or abnormal night sweats Eyes: Denies blurriness of vision, double vision or watery eyes Ears, nose, mouth, throat, and face: Denies mucositis or sore throat Respiratory: Denies cough, dyspnea or wheezes Cardiovascular: Denies palpitation, chest discomfort or lower extremity swelling Gastrointestinal:  Denies nausea, heartburn or change in bowel habits Skin: Denies abnormal skin rashes Lymphatics: Denies new lymphadenopathy or easy bruising Neurological:Denies numbness, tingling or new weaknesses Behavioral/Psych: Mood is stable, no new changes  All other systems were reviewed with the patient and are negative.  PHYSICAL EXAMINATION: ECOG PERFORMANCE STATUS: 2  Vitals:   11/01/15 1357  BP: (!) 152/59  Pulse: 88  Resp: 17  Temp: 98.8 F (37.1 C)   Filed Weights   11/01/15 1357  Weight: 152 lb 11.2 oz (69.3 kg)   GENERAL:alert, in mild distress due to his pain SKIN: skin color, texture, turgor are normal, no rashes or significant  lesions EYES: normal, conjunctiva are pink and non-injected, sclera clear OROPHARYNX:no exudate, no erythema and lips, buccal mucosa, and tongue normal  NECK: supple, thyroid normal size, non-tender, without nodularity LYMPH:  no palpable lymphadenopathy in the cervical, axillary or inguinal LUNGS: clear to auscultation and percussion with normal breathing effort HEART: regular rate & rhythm and no murmurs and no lower extremity edema ABDOMEN:abdomen soft, non-tender and normal bowel sounds, liver is enlarged, palpable 3-4cm below ribcage, mild tenderness, no significant ascites  Musculoskeletal:no cyanosis of digits and no clubbing  PSYCH: alert & oriented x 3 with fluent speech NEURO: no focal motor/sensory deficits  LABORATORY DATA:  I have reviewed the data as listed CBC Latest Ref Rng & Units 11/01/2015 10/11/2015 09/17/2012  WBC 4.0 - 10.3 10e3/uL 8.9 12.9(H) 9.8  Hemoglobin 13.0 - 17.1 g/dL 12.0(L) 14.0 12.6(L)  Hematocrit 38.4 - 49.9 % 35.9(L) 40.6 35.5(L)  Platelets 140 - 400 10e3/uL 193 201 143(L)   CMP Latest Ref Rng & Units 11/01/2015 10/11/2015 09/17/2012  Glucose 70 - 140 mg/dl 100 88 145(H)  BUN 7.0 - 26.0 mg/dL 12.6 16.7 16  Creatinine 0.7 - 1.3 mg/dL 1.1 1.0 0.99  Sodium 136 - 145 mEq/L 140 137 133(L)  Potassium 3.5 - 5.1 mEq/L 4.1 4.8 4.2  Chloride 96 - 112 mEq/L - - 97  CO2 22 - 29 mEq/L 26 20(L) 26  Calcium 8.4 - 10.4 mg/dL 8.9 9.6 8.8  Total Protein 6.4 - 8.3 g/dL 7.1 8.1 7.1  Total Bilirubin 0.20 - 1.20 mg/dL 0.43 1.04 0.7  Alkaline Phos 40 - 150 U/L 151(H) 165(H) 90  AST 5 - 34 U/L 37(H) 41(H) 86(H)  ALT 0 - 55 U/L 26 38 62(H)    RADIOGRAPHIC STUDIES: I have personally reviewed the radiological images as listed and agreed with the findings in the report. No results found.   Abdominal MRI with and without contrast 04/16/2013 IMPRESSION: 1. The previously ring enhancing lesion in the cephalad portion of segment 8 is poorly seen on today's exam due to  breathing motion artifact.  It may be minimally smaller but remains suspicious for hepatocellular carcinoma. 2. The slightly Dillon caudad lesion in segment 8 has enlarged from 1.9 x 1.6 cm to 2.4 x 2.7 cm. There is also a suspected adjacent segment 4A lesion measuring 1.3 x 2.0 cm, increased in conspicuity compared to prior, suspicious for hepatic cellular carcinoma. 3. Subtle anterior arterial phase enhancement at the junction of segment 8 and 4A, 2.1 x 1.0 cm, likewise suspicious for hepatocellular carcinoma.  I reviewed his outside CT abdomen report from 09/05/2015, see HPI   ASSESSMENT & PLAN: 65 year old Caucasian male with past medical history of successfully treated hepatitis C, liver cirrhosis, history of ascites and encephalopathy, recurrent HCC  1. Abdominal pain -Secondary to [facility] -better now with oxycodone and OxyContin, still not well controlled -I'll increase his OxyContin to 90 mg every 12 hours, and I give him a new prescription of oxycodone '20mg'$ , 1-2 tab q3h as needed for pain   2. Recurrent HCC, initially stage II -I reviewed his previous image and outside medical records extensively, confirmed key findings with patient and his family members -He initially had a multifocal disease, status post TACE 3 times in 2015 -he now has developed symptomatic local recurrence, with a 5cm new lesion in segment 8 with direct invasion into portal vein, and a portocaval lymph node -I have reviewed his outside CT scan  -He has good liver function (Child-Pugh A), likely to be a candidate for systemic therapy. I discussed the treatment option of sorafenib, regorafenib and immunotherapy with nivolumab -I recommend him to try sorafenib first, prescription was sent out today. He was started from low-dose 200 mg twice daily for the first week, if tolerates well, will increase to full doses. -The potential benefit and side effects of sorafenib, which includes but are not limited to, fatigue,  nausea, gastric discomfort, diarrhea, anorexia, skin rash, abnormal liver function, bleeding, perforation, hypertension, etc. were discussed with patient and his family members in details, he agrees to proceed -will repeat CT CAP w contrast next week, before he starts sorafenib   3. 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 Hep C, successfully treated -Continue follow-up with [facility]   Plan -Increase OxyContin to 90 mg twice daily -Oxycodone 20-40 mg every 3 hours as needed, prescription was given to him today, he will start when he receives it -Return to clinic in 1 week for follow-up, CT chest, abdomen and pelvis with contrast in 1-2 weeks  All questions were answered. The patient knows to call the clinic with any problems, questions or concerns. I spent 25 minutes counseling the patient face to face. The total time spent in the appointment was 35 minutes and Dillon than 50% was on counseling.     Truitt Merle, MD 11/01/2015

## 2015-11-01 NOTE — Telephone Encounter (Signed)
Gave patient avs report and appointments for October  °

## 2015-11-02 ENCOUNTER — Encounter: Payer: Self-pay | Admitting: Hematology

## 2015-11-02 LAB — AFP TUMOR MARKER: AFP, Serum, Tumor Marker: 3.7 ng/mL (ref 0.0–8.3)

## 2015-11-04 ENCOUNTER — Telehealth: Payer: Self-pay | Admitting: Medical Oncology

## 2015-11-04 NOTE — Telephone Encounter (Signed)
Questions about HIPPA and nexxavar-routed to chemo [facility]

## 2015-11-05 ENCOUNTER — Telehealth: Payer: Self-pay | Admitting: Medical Oncology

## 2015-11-05 MED FILL — *NEXAVAR 200MG TABLET: 200 | 30 days supply | Qty: 120 | Fill #0

## 2015-11-05 NOTE — Telephone Encounter (Signed)
Denyse Amass,   Could you know if sorafinib is approved and what his copay for that? Thanks.   Truitt Merle MD

## 2015-11-05 NOTE — Telephone Encounter (Signed)
asking if liver cancer is unresectable ( told her no) . Ade stated there was a question on the fax if [employer]  can reduce price on nexxavar-she stated drug is not eligible for pice reduction.

## 2015-11-05 NOTE — Telephone Encounter (Signed)
Gwenn with [facility] [phone]) calling to ask for prior authorization for Nexavar and verify HIPAA.  Reference number on th is order is [id number].

## 2015-11-06 NOTE — Telephone Encounter (Signed)
Oral Chemotherapy Pharmacist Encounter   I called [facility] to inquire about patient's sorafinib prescription.  Sorafinib has been approved by patient's insurance Psychologist, counselling) and copay is $8.25.  The pharmacy stated that prescription is ready and that they have attempted to call patient to come pick up.  Johny Drilling, PharmD, BCPS 11/06/2015  2:36 PM Oral Chemotherapy Clinic [phone]

## 2015-11-08 ENCOUNTER — Telehealth: Payer: Self-pay | Admitting: *Deleted

## 2015-11-08 ENCOUNTER — Telehealth: Payer: Self-pay | Admitting: Hematology

## 2015-11-08 ENCOUNTER — Ambulatory Visit (HOSPITAL_BASED_OUTPATIENT_CLINIC_OR_DEPARTMENT_OTHER): Payer: Medicare Other | Admitting: Hematology

## 2015-11-08 VITALS — BP 128/73 | HR 85 | Temp 97.8°F | Resp 17 | Ht 73.0 in | Wt 152.5 lb

## 2015-11-08 DIAGNOSIS — Z8619 Personal history of other infectious and parasitic diseases: Secondary | ICD-10-CM

## 2015-11-08 DIAGNOSIS — K7469 Other cirrhosis of liver: Secondary | ICD-10-CM | POA: Diagnosis not present

## 2015-11-08 DIAGNOSIS — G893 Neoplasm related pain (acute) (chronic): Secondary | ICD-10-CM | POA: Diagnosis not present

## 2015-11-08 DIAGNOSIS — C220 Liver cell carcinoma: Secondary | ICD-10-CM | POA: Diagnosis not present

## 2015-11-08 MED ORDER — OXYCODONE HCL ER 80 MG PO T12A: 80.0000 mg | EXTENDED_RELEASE_TABLET | Freq: Three times a day (TID) | ORAL | 0 refills | Status: DC

## 2015-11-08 MED FILL — OxyCONTIN 80 MG T12A: 80 | 30 days supply | Qty: 90 | Fill #0

## 2015-11-08 NOTE — Telephone Encounter (Signed)
Called pt at home and spoke with caregiver Carl Best - since pt was napping.  Instructed Pat to tell pt re: 1.   Take  Nexavar 200 mg by mouth  Twice  Daily  -  Starting  Sat 11/09/15  As per Dr. Ernestina Penna instructions until next office visit with md on 11/18/15. 2.   Remind pt of CT scan appt on 10/26. 3.   Office visit with Dr. Burr Medico on 11/18/15. Pat voiced understanding and stated she would relay message to pt.

## 2015-11-08 NOTE — Telephone Encounter (Signed)
Was called by the desk nurse on behalf of the patient to schedule appointment to be seen. The patient was to be seen on 10/20 per the 10/13 LOS, the patient was scheduled for 10/26 instead. The appointment was rescheduled for 10/20 per MD/ Nurse request.

## 2015-11-08 NOTE — Telephone Encounter (Signed)
Pt called and left message that pt has received chemo pills today. Pt's   Phone      [phone].

## 2015-11-09 ENCOUNTER — Encounter: Payer: Self-pay | Admitting: Hematology

## 2015-11-09 NOTE — Progress Notes (Signed)
[place]  [phone] Fax:([phone]) [phone]  Clinic Follow up Note   Patient Care Team: Asencion Noble, MD as PCP - General (Internal Medicine) Daneil Dolin, MD as Attending Physician (Gastroenterology) Antonietta Jewel, MD as Referring Physician (Internal Medicine) Truitt Merle, MD as Consulting Physician (Hematology) [name], CRNP as Nurse Practitioner (Nurse Practitioner) 11/08/2015  CHIEF COMPLAINTS:  Abdominal pain   Oncology History   Hepatocellular carcinoma [facility])   Staging form: Liver (Excluding Intrahepatic Bile Ducts), AJCC 7th Edition   - Clinical stage from 04/16/2012: Stage II (T2(m), N0, M0) - Signed by Truitt Merle, MD on 10/12/2015   - Pathologic stage from 04/16/2013: Stage II (T2, N0, cM0) - Signed by Truitt Merle, MD on 10/12/2015      Hepatocellular carcinoma ([place])   09/14/2012 Tumor Marker    AFP 28.9      04/16/2013 Imaging    Abdominal MRI with and without contrast reviewed multifocal (4) liver lesions in both left and right lobes, most consistent with [place], largest measuring 2.7 cm      04/16/2013 Initial Diagnosis    Hepatocellular carcinoma ([place])      04/28/2013 Procedure    Right TACE with lipiodol       06/15/2013 Procedure    Left TACE with [facility]       07/14/2013 Procedure    Right TACE with [facility]      09/05/2015 Imaging    CT abdomen with and without contrast showed a new 5.0 x 2.3 cm mass in the hepatic segment 8, invading and occluding the right anterior portal vein, most compatible with [place]. A portacaval node measuring 3.0 x 1.4 cm, previously 2.9 x 1.1 cm.      10/11/2015 Tumor Marker    AFP 5.3       HISTORY OF PRESENTING ILLNESS (10/11/2015):  Kevin Dillon 65 y.o. male with past medical history of treated hepatitis C, liver cirrhosis, and hepatocellular carcinoma is here because of recurrent hepatocellular carcinoma. He is accompanied by his significant other [name] and her sister.  He was diagnosed with hepatocellular  carcinoma in 2015, his initial image result is not available and staging is unknown. He was seen by interventional radiologist Dr. Delice Lesch at [facility] in [place] and underwent TACE procedure 3 times in 2015. He was subsequently followed, and recently repeated CT scan showed a new 5.0 cm mass in segment 8, with portal vein invasion. He was felt not to be a candidate for further liver targeted therapy, and was referred to [place] for further systemic therapy.  He complains about mid epigastric pain for the past 2 years, which significantly improved after his TACE procedure in 2015. It has been getting worse lately in the past 6 months. He states he gets about 7-8 out of 10, persistent, he has been taking oxycodone 20 mg every 6 hours, but his pain is not well controlled. He is quite fatigued, is low appetite. He last 40 lbs in the pat 4 months. He is able to function at home, in trying to remain to be physically active.  His hepatitis C was successfully treated. He has history of hepatitis C, complicated with ascites and encephalopathy, but it has been well controlled with medical management.   CURRENT THERAPY: Pending sorafenib   INTERIM HISTORY: Shannon returns for follow up and pain medication adjustment. He has been taking OxyContin 90 mg twice daily, oxycodone 20-40 mg every 3-4 hours (avarage '200mg'$ /day) since last week, he states his  pain is moderately controlled, much better than last week,  he is not sedated, constipation is controlled. Nausea is slightly better, his weight is stable. No other new complaints he is able to function better at home.  MEDICAL HISTORY:  Past Medical History:  Diagnosis Date  . Cholelithiasis   . Chronic hepatitis C ([place])   . Chronic knee pain   . Chronic left shoulder pain   . CVA (cerebral infarction)   . Gout   . Hepatitis C    genotype 1b.  pt has been vaccinated against hep A and B.  . Hypertension   . Osteoarthritis   . Schatzki's ring   . Tubular  adenoma of colon 12/2011    SURGICAL HISTORY: Past Surgical History:  Procedure Laterality Date  . AMPUTATION Left 09/17/2012   Procedure: SMALL FINGER EXTENSOR TENDON REPAIR; METACARPAL LEVEL AMPUTATION RING FINGER; PROXIMAL PHALANX LEVEL AMPUTATION LONG FINGER;  Surgeon: Schuyler Amor, MD;  Location: [place];  Service: Orthopedics;  Laterality: Left;  . Arm surgery     right/plate in arm  . COLONOSCOPY WITH ESOPHAGOGASTRODUODENOSCOPY (EGD)  12/30/2011   RMR: Noncritical Schatzki's ring;  Hiatal hernia, Tubular ADENOMA removed from splenic flexure, otherwise normal colonoscopy  . LEG SURGERY     left  . WOUND EXPLORATION Left 09/17/2012   Procedure: WOUND EXPLORATION;  Surgeon: Schuyler Amor, MD;  Location: [place];  Service: Orthopedics;  Laterality: Left;    SOCIAL HISTORY: Social History   Social History  . Marital status: Single    Spouse name: N/A  . Number of children: 0  . Years of education: N/A   Occupational History  . disabled    Social History Main Topics  . Smoking status: Former Smoker    Packs/day: 0.50    Years: 40.00    Types: Cigarettes    Quit date: 01/19/2012  . Smokeless tobacco: Never Used     Comment: Smokes 1/2 pack of cigarettes daily  . Alcohol use No     Comment: HX 2 beers 3-4 days per week; QUIT DEC 2013  . Drug use: No     Comment: Hx cocaine yrs ago, Last marijuana OCT 2013  . Sexual activity: Not Currently    Partners: Female   Other Topics Concern  . Not on file   Social History Narrative   Lives w/ significant other, [name]    FAMILY HISTORY: Family History  Problem Relation Age of Onset  . Cirrhosis Father 16  . Cancer Father     liver cancer   . Lung cancer Mother 55  . Colon cancer Neg Hx     ALLERGIES:  has No Known Allergies.  MEDICATIONS:  Current Outpatient Prescriptions  Medication Sig Dispense Refill  . ALPRAZolam (XANAX) 1 MG tablet Take 1 tablet (1 mg total) by mouth 2 (two) times daily as  needed for anxiety. 60 tablet 0  . docusate sodium (COLACE) 100 MG capsule Take 1 capsule (100 mg total) by mouth 2 (two) times daily as needed. 60 capsule 5  . hydrALAZINE (APRESOLINE) 25 MG tablet Take 50 mg by mouth daily.     . megestrol (MEGACE) 40 MG/ML suspension Take 400 mg by mouth daily.     . Multiple Vitamins-Minerals (CENTRUM SILVER PO) Take 1 tablet by mouth daily.     Marland Kitchen oxyCODONE (OXYCONTIN) 80 mg 12 hr tablet Take 1 tablet (80 mg total) by mouth every 8 (eight) hours. 90 tablet 0  . Oxycodone HCl  20 MG TABS Take 1-2 tablets (20-40 mg total) by mouth every 3 (three) hours as needed. 140 tablet 0  . PARoxetine (PAXIL) 20 MG tablet Take 20 mg by mouth every morning.     . polyethylene glycol (MIRALAX / GLYCOLAX) packet Take 17 g by mouth daily as needed (constipation). (Patient not taking: Reported on 11/01/2015) 30 each 11  . prochlorperazine (COMPAZINE) 10 MG tablet Take 1 tablet (10 mg total) by mouth every 8 (eight) hours as needed for nausea or vomiting. 30 tablet 1  . SORAfenib (NEXAVAR) 200 MG tablet Take 2 tablets (400 mg total) by mouth 2 (two) times daily. Give on an empty stomach 1 hour before or 2 hours after meals. 120 tablet 0   No current facility-administered medications for this visit.     REVIEW OF SYSTEMS:   Constitutional: Denies fevers, chills or abnormal night sweats Eyes: Denies blurriness of vision, double vision or watery eyes Ears, nose, mouth, throat, and face: Denies mucositis or sore throat Respiratory: Denies cough, dyspnea or wheezes Cardiovascular: Denies palpitation, chest discomfort or lower extremity swelling Gastrointestinal:  Denies nausea, heartburn or change in bowel habits Skin: Denies abnormal skin rashes Lymphatics: Denies new lymphadenopathy or easy bruising Neurological:Denies numbness, tingling or new weaknesses Behavioral/Psych: Mood is stable, no new changes  All other systems were reviewed with the patient and are  negative.  PHYSICAL EXAMINATION: ECOG PERFORMANCE STATUS: 2  Vitals:   11/08/15 0948  BP: 128/73  Pulse: 85  Resp: 17  Temp: 97.8 F (36.6 C)   Filed Weights   11/08/15 0948  Weight: 152 lb 8 oz (69.2 kg)   GENERAL:alert, in mild distress due to his pain SKIN: skin color, texture, turgor are normal, no rashes or significant lesions EYES: normal, conjunctiva are pink and non-injected, sclera clear OROPHARYNX:no exudate, no erythema and lips, buccal mucosa, and tongue normal  NECK: supple, thyroid normal size, non-tender, without nodularity LYMPH:  no palpable lymphadenopathy in the cervical, axillary or inguinal LUNGS: clear to auscultation and percussion with normal breathing effort HEART: regular rate & rhythm and no murmurs and no lower extremity edema ABDOMEN:abdomen soft, non-tender and normal bowel sounds, liver is enlarged, palpable 3-4cm below ribcage, mild tenderness, no significant ascites  Musculoskeletal:no cyanosis of digits and no clubbing  PSYCH: alert & oriented x 3 with fluent speech NEURO: no focal motor/sensory deficits  LABORATORY DATA:  I have reviewed the data as listed CBC Latest Ref Rng & Units 11/01/2015 10/11/2015 09/17/2012  WBC 4.0 - 10.3 10e3/uL 8.9 12.9(H) 9.8  Hemoglobin 13.0 - 17.1 g/dL 12.0(L) 14.0 12.6(L)  Hematocrit 38.4 - 49.9 % 35.9(L) 40.6 35.5(L)  Platelets 140 - 400 10e3/uL 193 201 143(L)   CMP Latest Ref Rng & Units 11/01/2015 10/11/2015 09/17/2012  Glucose 70 - 140 mg/dl 100 88 145(H)  BUN 7.0 - 26.0 mg/dL 12.6 16.7 16  Creatinine 0.7 - 1.3 mg/dL 1.1 1.0 0.99  Sodium 136 - 145 mEq/L 140 137 133(L)  Potassium 3.5 - 5.1 mEq/L 4.1 4.8 4.2  Chloride 96 - 112 mEq/L - - 97  CO2 22 - 29 mEq/L 26 20(L) 26  Calcium 8.4 - 10.4 mg/dL 8.9 9.6 8.8  Total Protein 6.4 - 8.3 g/dL 7.1 8.1 7.1  Total Bilirubin 0.20 - 1.20 mg/dL 0.43 1.04 0.7  Alkaline Phos 40 - 150 U/L 151(H) 165(H) 90  AST 5 - 34 U/L 37(H) 41(H) 86(H)  ALT 0 - 55 U/L 26 38 62(H)     RADIOGRAPHIC STUDIES:  I have personally reviewed the radiological images as listed and agreed with the findings in the report. No results found.   Abdominal MRI with and without contrast 04/16/2013 IMPRESSION: 1. The previously ring enhancing lesion in the cephalad portion of segment 8 is poorly seen on today's exam due to breathing motion artifact. It may be minimally smaller but remains suspicious for hepatocellular carcinoma. 2. The slightly more caudad lesion in segment 8 has enlarged from 1.9 x 1.6 cm to 2.4 x 2.7 cm. There is also a suspected adjacent segment 4A lesion measuring 1.3 x 2.0 cm, increased in conspicuity compared to prior, suspicious for hepatic cellular carcinoma. 3. Subtle anterior arterial phase enhancement at the junction of segment 8 and 4A, 2.1 x 1.0 cm, likewise suspicious for hepatocellular carcinoma.  I reviewed his outside CT abdomen report from 09/05/2015, see HPI   ASSESSMENT & PLAN: 65 year old Caucasian male with past medical history of successfully treated hepatitis C, liver cirrhosis, history of ascites and encephalopathy, recurrent HCC  1. Abdominal pain -Secondary to [facility] -better now with oxycodone and OxyContin, still not well controlled -I'll increase his OxyContin to 90 mg (3 tab) every 8 hours, and I give him a new prescription of OxyContin 80 mg every 8 hours, he was started after he completes his OxyContin 30 mg pills.  He will continue oxycodone '20mg'$ , 1-2 tab q3h as needed for pain   2. Recurrent HCC, initially stage II -I reviewed his previous image and outside medical records extensively, confirmed key findings with patient and his family members -He initially had a multifocal disease, status post TACE 3 times in 2015 -he now has developed symptomatic local recurrence, with a 5cm new lesion in segment 8 with direct invasion into portal vein, and a portocaval lymph node -I have reviewed his outside CT scan  -He has good liver  function (Child-Pugh A), likely to be a candidate for systemic therapy. I discussed the treatment option of sorafenib, regorafenib and immunotherapy with nivolumab -I recommend him to try sorafenib first -The potential benefit and side effects of sorafenib, which includes but are not limited to, fatigue, nausea, gastric discomfort, diarrhea, anorexia, skin rash, abnormal liver function, bleeding, perforation, hypertension, etc. were discussed with patient and his family members in details, he agrees to proceed -will repeat CT CAP w contrast next week -His sorafenib has been approved by his insurance, with minimal co-pay, he is going to start when he pick up in the next few days -He will started from low-dose 200 mg twice daily for the first few weeks, if tolerates well, will increase to full doses.  3. 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 Hep C, successfully treated -Continue follow-up with [facility]   Plan -Increase OxyContin to 90 mg every 8 hrs, when he finishes his pills, he will start OxyContin 80 mg every 8 hours -continue Oxycodone 20-40 mg every 3-4 hours as needed -He will start sorafenib 200 mg twice a day in a few days where he picks up, until his next visit -He is scheduled to have CT scan next week, and I'll see him back on 10/30  All questions were answered. The patient knows to call the clinic with any problems, questions or concerns. I spent 25 minutes counseling the patient face to face. The total time spent in the  appointment was 35 minutes and more than 50% was on counseling.     Truitt Merle, MD 11/08/2015

## 2015-11-14 ENCOUNTER — Ambulatory Visit: Payer: Medicare Other | Admitting: Hematology

## 2015-11-14 ENCOUNTER — Telehealth: Payer: Self-pay | Admitting: *Deleted

## 2015-11-14 ENCOUNTER — Ambulatory Visit (HOSPITAL_COMMUNITY)
Admission: RE | Admit: 2015-11-14 | Discharge: 2015-11-14 | Disposition: A | Discharge status: Home / Self Care | Payer: Medicare Other | Source: Ambulatory Visit | Attending: Hematology | Admitting: Hematology

## 2015-11-14 ENCOUNTER — Encounter (HOSPITAL_COMMUNITY): Payer: Self-pay

## 2015-11-14 ENCOUNTER — Other Ambulatory Visit (HOSPITAL_BASED_OUTPATIENT_CLINIC_OR_DEPARTMENT_OTHER): Payer: Medicare Other

## 2015-11-14 DIAGNOSIS — R918 Other nonspecific abnormal finding of lung field: Secondary | ICD-10-CM | POA: Diagnosis not present

## 2015-11-14 DIAGNOSIS — R590 Localized enlarged lymph nodes: Secondary | ICD-10-CM | POA: Insufficient documentation

## 2015-11-14 DIAGNOSIS — I81 Portal vein thrombosis: Secondary | ICD-10-CM | POA: Insufficient documentation

## 2015-11-14 DIAGNOSIS — C220 Liver cell carcinoma: Secondary | ICD-10-CM

## 2015-11-14 DIAGNOSIS — C61 Malignant neoplasm of prostate: Secondary | ICD-10-CM | POA: Diagnosis not present

## 2015-11-14 LAB — CBC WITH DIFFERENTIAL/PLATELET
BASO%: 0.4 % (ref 0.0–2.0)
BASOS ABS: 0 10*3/uL (ref 0.0–0.1)
EOS%: 1.1 % (ref 0.0–7.0)
Eosinophils Absolute: 0.1 10*3/uL (ref 0.0–0.5)
HCT: 34.5 % — ABNORMAL LOW (ref 38.4–49.9)
HGB: 11.9 g/dL — ABNORMAL LOW (ref 13.0–17.1)
LYMPH%: 34 % (ref 14.0–49.0)
MCH: 30.8 pg (ref 27.2–33.4)
MCHC: 34.5 g/dL (ref 32.0–36.0)
MCV: 89.4 fL (ref 79.3–98.0)
MONO#: 0.7 10*3/uL (ref 0.1–0.9)
MONO%: 8.4 % (ref 0.0–14.0)
NEUT#: 4.6 10*3/uL (ref 1.5–6.5)
NEUT%: 56.1 % (ref 39.0–75.0)
NRBC: 0 % (ref 0–0)
Platelets: 219 10*3/uL (ref 140–400)
RBC: 3.86 10*6/uL — AB (ref 4.20–5.82)
RDW: 13.5 % (ref 11.0–14.6)
WBC: 8.2 10*3/uL (ref 4.0–10.3)
lymph#: 2.8 10*3/uL (ref 0.9–3.3)

## 2015-11-14 LAB — COMPREHENSIVE METABOLIC PANEL
ALT: 27 U/L (ref 0–55)
AST: 43 U/L — ABNORMAL HIGH (ref 5–34)
Albumin: 3.3 g/dL — ABNORMAL LOW (ref 3.5–5.0)
Alkaline Phosphatase: 217 U/L — ABNORMAL HIGH (ref 40–150)
Anion Gap: 10 mEq/L (ref 3–11)
BILIRUBIN TOTAL: 0.44 mg/dL (ref 0.20–1.20)
BUN: 14.5 mg/dL (ref 7.0–26.0)
CHLORIDE: 108 meq/L (ref 98–109)
CO2: 21 meq/L — AB (ref 22–29)
Calcium: 9.4 mg/dL (ref 8.4–10.4)
Creatinine: 1.1 mg/dL (ref 0.7–1.3)
EGFR: 69 mL/min/{1.73_m2} — AB (ref >90)
GLUCOSE: 95 mg/dL (ref 70–140)
POTASSIUM: 4.6 meq/L (ref 3.5–5.1)
SODIUM: 139 meq/L (ref 136–145)
TOTAL PROTEIN: 8.1 g/dL (ref 6.4–8.3)

## 2015-11-14 MED ORDER — OXYCODONE HCL 20 MG PO TABS: 1.0000 | ORAL_TABLET | ORAL | 0 refills | Status: DC | PRN

## 2015-11-14 MED ORDER — IOPAMIDOL (ISOVUE-300) INJECTION 61%
100.0000 mL | Freq: Once | INTRAVENOUS | Status: AC | PRN
  Administered 2015-11-14: 100 mL via INTRAVENOUS

## 2015-11-14 MED FILL — oxyCODONE HCL 20 MG TABS: 20 | 8 days supply | Qty: 140 | Fill #0

## 2015-11-14 NOTE — Telephone Encounter (Signed)
Walk in request for Oxycodone refill.

## 2015-11-18 ENCOUNTER — Encounter: Payer: Self-pay | Admitting: Hematology

## 2015-11-18 ENCOUNTER — Telehealth: Payer: Self-pay | Admitting: Hematology

## 2015-11-18 ENCOUNTER — Ambulatory Visit (HOSPITAL_BASED_OUTPATIENT_CLINIC_OR_DEPARTMENT_OTHER): Payer: Medicare Other | Admitting: Hematology

## 2015-11-18 VITALS — BP 189/79 | HR 80 | Temp 97.9°F | Resp 18 | Ht 73.0 in | Wt 153.0 lb

## 2015-11-18 DIAGNOSIS — G893 Neoplasm related pain (acute) (chronic): Secondary | ICD-10-CM | POA: Diagnosis not present

## 2015-11-18 DIAGNOSIS — C220 Liver cell carcinoma: Secondary | ICD-10-CM

## 2015-11-18 DIAGNOSIS — K7469 Other cirrhosis of liver: Secondary | ICD-10-CM

## 2015-11-18 DIAGNOSIS — Z8619 Personal history of other infectious and parasitic diseases: Secondary | ICD-10-CM

## 2015-11-18 DIAGNOSIS — R109 Unspecified abdominal pain: Secondary | ICD-10-CM | POA: Diagnosis not present

## 2015-11-18 NOTE — Progress Notes (Signed)
Submitted auth request for Oxycodone 20 mg today.

## 2015-11-18 NOTE — Telephone Encounter (Signed)
Appointments scheduled per 10/30 LOS. Patient given AVS report and calendars with future scheduled appointments. °

## 2015-11-18 NOTE — Progress Notes (Signed)
[place]  [phone] [phone]  Clinic Follow up Note   Patient Care Team: Asencion Noble, MD as PCP - General (Internal Medicine) Daneil Dolin, MD as Attending Physician (Gastroenterology) Antonietta Jewel, MD as Referring Physician (Internal Medicine) Truitt Merle, MD as Consulting Physician (Hematology) [name], CRNP as Nurse Practitioner (Nurse Practitioner) 11/18/2015   CHIEF COMPLAINTS:  Follow up recurrent [place]   Oncology History   Hepatocellular carcinoma [facility])   Staging form: Liver (Excluding Intrahepatic Bile Ducts), AJCC 7th Edition   - Clinical stage from 04/16/2012: Stage II (T2(m), N0, M0) - Signed by Truitt Merle, MD on 10/12/2015   - Pathologic stage from 04/16/2013: Stage II (T2, N0, cM0) - Signed by Truitt Merle, MD on 10/12/2015      Hepatocellular carcinoma ([place])   09/14/2012 Tumor Marker    AFP 28.9      04/16/2013 Imaging    Abdominal MRI with and without contrast reviewed multifocal (4) liver lesions in both left and right lobes, most consistent with [place], largest measuring 2.7 cm      04/16/2013 Initial Diagnosis    Hepatocellular carcinoma ([place])      04/28/2013 Procedure    Right TACE with lipiodol       06/15/2013 Procedure    Left TACE with [facility]       07/14/2013 Procedure    Right TACE with [facility]      09/05/2015 Imaging    CT abdomen with and without contrast showed a new 5.0 x 2.3 cm mass in the hepatic segment 8, invading and occluding the right anterior portal vein, most compatible with [place]. A portacaval node measuring 3.0 x 1.4 cm, previously 2.9 x 1.1 cm.      10/11/2015 Tumor Marker    AFP 5.3       HISTORY OF PRESENTING ILLNESS (10/11/2015):  Kevin Dillon 65 y.o. male with past medical history of treated hepatitis C, liver cirrhosis, and hepatocellular carcinoma is here because of recurrent hepatocellular carcinoma. He is accompanied by his significant other [name] and her sister.  He was diagnosed with  hepatocellular carcinoma in 2015, his initial image result is not available and staging is unknown. He was seen by interventional radiologist Dr. Delice Lesch at [facility] in [place] and underwent TACE procedure 3 times in 2015. He was subsequently followed, and recently repeated CT scan showed a new 5.0 cm mass in segment 8, with portal vein invasion. He was felt not to be a candidate for further liver targeted therapy, and was referred to [place] for further systemic therapy.  He complains about mid epigastric pain for the past 2 years, which significantly improved after his TACE procedure in 2015. It has been getting worse lately in the past 6 months. He states he gets about 7-8 out of 10, persistent, he has been taking oxycodone 20 mg every 6 hours, but his pain is not well controlled. He is quite fatigued, is low appetite. He last 40 lbs in the pat 4 months. He is able to function at home, in trying to remain to be physically active.  His hepatitis C was successfully treated. He has history of hepatitis C, complicated with ascites and encephalopathy, but it has been well controlled with medical management.   CURRENT THERAPY: sorafenib '200mg'$  bid started on 11/11/2015  INTERIM HISTORY: [place] returns for follow up. He started sorafenib one week ago, tolerating well His pain overall much better, on oxydontin '90mg'$  tid and oxycodone '20mg'$  8-9  tab a day Mild fatigue, but able to function and remains active at home Eats better, weight stable  MEDICAL HISTORY:  Past Medical History:  Diagnosis Date  . Cholelithiasis   . Chronic hepatitis C ([place])   . Chronic knee pain   . Chronic left shoulder pain   . CVA (cerebral infarction)   . Gout   . Hepatitis C    genotype 1b.  pt has been vaccinated against hep A and B.  . Hypertension   . Osteoarthritis   . Schatzki's ring   . Tubular adenoma of colon 12/2011    SURGICAL HISTORY: Past Surgical History:  Procedure Laterality Date  . AMPUTATION  Left 09/17/2012   Procedure: SMALL FINGER EXTENSOR TENDON REPAIR; METACARPAL LEVEL AMPUTATION RING FINGER; PROXIMAL PHALANX LEVEL AMPUTATION LONG FINGER;  Surgeon: Schuyler Amor, MD;  Location: [facility];  Service: Orthopedics;  Laterality: Left;  . Arm surgery     right/plate in arm  . COLONOSCOPY WITH ESOPHAGOGASTRODUODENOSCOPY (EGD)  12/30/2011   RMR: Noncritical Schatzki's ring;  Hiatal hernia, Tubular ADENOMA removed from splenic flexure, otherwise normal colonoscopy  . LEG SURGERY     left  . WOUND EXPLORATION Left 09/17/2012   Procedure: WOUND EXPLORATION;  Surgeon: Schuyler Amor, MD;  Location: [place];  Service: Orthopedics;  Laterality: Left;    SOCIAL HISTORY: Social History   Social History  . Marital status: Single    Spouse name: N/A  . Number of children: 0  . Years of education: N/A   Occupational History  . disabled    Social History Main Topics  . Smoking status: Former Smoker    Packs/day: 0.50    Years: 40.00    Types: Cigarettes    Quit date: 01/19/2012  . Smokeless tobacco: Never Used     Comment: Smokes 1/2 pack of cigarettes daily  . Alcohol use No     Comment: HX 2 beers 3-4 days per week; QUIT DEC 2013  . Drug use: No     Comment: Hx cocaine yrs ago, Last marijuana OCT 2013  . Sexual activity: Not Currently    Partners: Female   Other Topics Concern  . Not on file   Social History Narrative   Lives w/ significant other, [name]    FAMILY HISTORY: Family History  Problem Relation Age of Onset  . Cirrhosis Father 56  . Cancer Father     liver cancer   . Lung cancer Mother 22  . Colon cancer Neg Hx     ALLERGIES:  has No Known Allergies.  MEDICATIONS:  Current Outpatient Prescriptions  Medication Sig Dispense Refill  . ALPRAZolam (XANAX) 1 MG tablet Take 1 tablet (1 mg total) by mouth 2 (two) times daily as needed for anxiety. 60 tablet 0  . docusate sodium (COLACE) 100 MG capsule Take 1 capsule (100 mg total) by mouth 2  (two) times daily as needed. 60 capsule 5  . hydrALAZINE (APRESOLINE) 25 MG tablet Take 50 mg by mouth daily.     . megestrol (MEGACE) 40 MG/ML suspension Take 400 mg by mouth daily.     . Multiple Vitamins-Minerals (CENTRUM SILVER PO) Take 1 tablet by mouth daily.     Marland Kitchen oxyCODONE (OXYCONTIN) 80 mg 12 hr tablet Take 1 tablet (80 mg total) by mouth every 8 (eight) hours. 90 tablet 0  . Oxycodone HCl 20 MG TABS Take 1-2 tablets (20-40 mg total) by mouth every 3 (three) hours as needed. 140 tablet  0  . PARoxetine (PAXIL) 20 MG tablet Take 20 mg by mouth every morning.     . polyethylene glycol (MIRALAX / GLYCOLAX) packet Take 17 g by mouth daily as needed (constipation). 30 each 11  . prochlorperazine (COMPAZINE) 10 MG tablet Take 1 tablet (10 mg total) by mouth every 8 (eight) hours as needed for nausea or vomiting. 30 tablet 1  . SORAfenib (NEXAVAR) 200 MG tablet Take 2 tablets (400 mg total) by mouth 2 (two) times daily. Give on an empty stomach 1 hour before or 2 hours after meals. 120 tablet 0   No current facility-administered medications for this visit.     REVIEW OF SYSTEMS:   Constitutional: Denies fevers, chills or abnormal night sweats, (+) fatigue  Eyes: Denies blurriness of vision, double vision or watery eyes Ears, nose, mouth, throat, and face: Denies mucositis or sore throat Respiratory: Denies cough, dyspnea or wheezes Cardiovascular: Denies palpitation, chest discomfort or lower extremity swelling Gastrointestinal:  Denies nausea, heartburn or change in bowel habits, (+) abdominal pain  Skin: Denies abnormal skin rashes Lymphatics: Denies new lymphadenopathy or easy bruising Neurological:Denies numbness, tingling or new weaknesses Behavioral/Psych: Mood is stable, no new changes  All other systems were reviewed with the patient and are negative.  PHYSICAL EXAMINATION: ECOG PERFORMANCE STATUS: 2  Vitals:   11/18/15 1101  BP: (!) 189/79  Pulse: 80  Resp: 18  Temp: 97.9  F (36.6 C)   Filed Weights   11/18/15 1101  Weight: 153 lb (69.4 kg)   GENERAL:alert, in mild distress due to his pain SKIN: skin color, texture, turgor are normal, no rashes or significant lesions EYES: normal, conjunctiva are pink and non-injected, sclera clear OROPHARYNX:no exudate, no erythema and lips, buccal mucosa, and tongue normal  NECK: supple, thyroid normal size, non-tender, without nodularity LYMPH:  no palpable lymphadenopathy in the cervical, axillary or inguinal LUNGS: clear to auscultation and percussion with normal breathing effort HEART: regular rate & rhythm and no murmurs and no lower extremity edema ABDOMEN:abdomen soft, non-tender and normal bowel sounds, liver is enlarged, palpable 3-4cm below ribcage, mild tenderness, no significant ascites  Musculoskeletal:no cyanosis of digits and no clubbing  PSYCH: alert & oriented x 3 with fluent speech NEURO: no focal motor/sensory deficits  LABORATORY DATA:  I have reviewed the data as listed CBC Latest Ref Rng & Units 11/14/2015 11/01/2015 10/11/2015  WBC 4.0 - 10.3 10e3/uL 8.2 8.9 12.9(H)  Hemoglobin 13.0 - 17.1 g/dL 11.9(L) 12.0(L) 14.0  Hematocrit 38.4 - 49.9 % 34.5(L) 35.9(L) 40.6  Platelets 140 - 400 10e3/uL 219 193 201   CMP Latest Ref Rng & Units 11/14/2015 11/01/2015 10/11/2015  Glucose 70 - 140 mg/dl 95 100 88  BUN 7.0 - 26.0 mg/dL 14.5 12.6 16.7  Creatinine 0.7 - 1.3 mg/dL 1.1 1.1 1.0  Sodium 136 - 145 mEq/L 139 140 137  Potassium 3.5 - 5.1 mEq/L 4.6 4.1 4.8  Chloride 96 - 112 mEq/L - - -  CO2 22 - 29 mEq/L 21(L) 26 20(L)  Calcium 8.4 - 10.4 mg/dL 9.4 8.9 9.6  Total Protein 6.4 - 8.3 g/dL 8.1 7.1 8.1  Total Bilirubin 0.20 - 1.20 mg/dL 0.44 0.43 1.04  Alkaline Phos 40 - 150 U/L 217(H) 151(H) 165(H)  AST 5 - 34 U/L 43(H) 37(H) 41(H)  ALT 0 - 55 U/L 27 26 38   AFP: 09/14/2012: 28.9 10/11/2015: 5.3 11/01/2015: 3.7   RADIOGRAPHIC STUDIES: I have personally reviewed the radiological images as listed  and agreed  with the findings in the report. Ct Chest W Contrast  Result Date: 11/14/2015 CLINICAL DATA:  Prostate carcinoma diagnosed 2013. Oral chemotherapy ongoing. Hepatitis-C. EXAM: CT CHEST WITH CONTRAST CT ABDOMEN AND PELVIS WITH AND WITHOUT CONTRAST TECHNIQUE: Multidetector CT imaging of the chest was performed during intravenous contrast administration. Multidetector CT imaging of the abdomen and pelvis was performed following the standard protocol before and during bolus administration of intravenous contrast. CONTRAST:  129m ISOVUE-300 IOPAMIDOL (ISOVUE-300) INJECTION 61% COMPARISON:  Outside CT 09/05/2015 FINDINGS: CT CHEST FINDINGS Cardiovascular: No acute vascular findings. No central pulmonary embolism. No pericardial fluid. Mediastinum/Nodes: No axillary or supraclavicular adenopathy. No mediastinal hilar adenopathy. Mild thickening of the distal esophagus. Lungs/Pleura: Within knee RIGHT upper lobe 6 mm nodule (image 14, series 5. No comparison available. Musculoskeletal: No aggressive osseous lesion. CT ABDOMEN AND PELVIS FINDINGS Hepatobiliary: The RIGHT hepatic lobe is lobular with ill-defined enhancing lesions. The degree enhanced appears increased from comparison CT. There is clear progression of the thrombus of the main portal vein. The entirety of the portal vein is expanded to the level of the SMV with relative low attenuation tissue. The portal vein is expanded 2 point 2 cm at the level of the confluence of the splenic vein and SMV (image 37, series 2). No clear enhancement of the central portion of this thrombus extension. LEFT and RIGHT portal veins are expanded with poor perfusion. Hepatic veins are patent. LEFT hepatic lobe appears free of disease. The gallbladder is collapsed. Mild periportal adenopathy is similar prior. Pancreas: Pancreas is normal. No ductal dilatation. No pancreatic inflammation. Spleen: Spleen is normal.  Splenic vein is patent. Adrenals/urinary tract: Adrenal  glands and kidneys are normal. The ureters and bladder normal. Stomach/Bowel: Stomach, small bowel, appendix, and cecum are normal. The colon and rectosigmoid colon are normal. Vascular/Lymphatic: Abdominal aorta is normal caliber. There is no retroperitoneal or periportal lymphadenopathy. No pelvic lymphadenopathy. Reproductive: Prostate normal Other: No ascites. Musculoskeletal: No aggressive osseous lesion IMPRESSION: Chest Impression: 1. RIGHT upper lobe pulmonary nodule is indeterminate. No comparison available. 2. Ground-glass opacity and peripheral nodules in the RIGHT middle lobe appear post infectious or inflammatory. Abdomen / Pelvis Impression: 1. 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 Thrombus also likely within the LEFT and RIGHT portal vein. Difficult to distinguish tumor thrombus versus bland thrombus. 2. Individual lesions are difficult to define in the RIGHT hepatic lobe. There is overall impression of progression of disease in the RIGHT hepatic lobe with multiple ill-defined enhancing lesions. 3. Periportal and shotty retroperitoneal adenopathy similar to prior. Electronically Signed   By: SSuzy BouchardM.D.   On: 11/14/2015 15:27   Ct Abdomen Pelvis W Contrast  Result Date: 11/14/2015 CLINICAL DATA:  Prostate carcinoma diagnosed 2013. Oral chemotherapy ongoing. Hepatitis-C. EXAM: CT CHEST WITH CONTRAST CT ABDOMEN AND PELVIS WITH AND WITHOUT CONTRAST TECHNIQUE: Multidetector CT imaging of the chest was performed during intravenous contrast administration. Multidetector CT imaging of the abdomen and pelvis was performed following the standard protocol before and during bolus administration of intravenous contrast. CONTRAST:  1017mISOVUE-300 IOPAMIDOL (ISOVUE-300) INJECTION 61% COMPARISON:  Outside CT 09/05/2015 FINDINGS: CT CHEST FINDINGS Cardiovascular: No acute vascular findings. No central  pulmonary embolism. No pericardial fluid. Mediastinum/Nodes: No axillary or supraclavicular adenopathy. No mediastinal hilar adenopathy. Mild thickening of the distal esophagus. Lungs/Pleura: Within knee RIGHT upper lobe 6 mm nodule (image 14, series 5. No comparison available. Musculoskeletal: No aggressive  osseous lesion. CT ABDOMEN AND PELVIS FINDINGS Hepatobiliary: The RIGHT hepatic lobe is lobular with ill-defined enhancing lesions. The degree enhanced appears increased from comparison CT. There is clear progression of the thrombus of the main portal vein. The entirety of the portal vein is expanded to the level of the SMV with relative low attenuation tissue. The portal vein is expanded 2 point 2 cm at the level of the confluence of the splenic vein and SMV (image 37, series 2). No clear enhancement of the central portion of this thrombus extension. LEFT and RIGHT portal veins are expanded with poor perfusion. Hepatic veins are patent. LEFT hepatic lobe appears free of disease. The gallbladder is collapsed. Mild periportal adenopathy is similar prior. Pancreas: Pancreas is normal. No ductal dilatation. No pancreatic inflammation. Spleen: Spleen is normal.  Splenic vein is patent. Adrenals/urinary tract: Adrenal glands and kidneys are normal. The ureters and bladder normal. Stomach/Bowel: Stomach, small bowel, appendix, and cecum are normal. The colon and rectosigmoid colon are normal. Vascular/Lymphatic: Abdominal aorta is normal caliber. There is no retroperitoneal or periportal lymphadenopathy. No pelvic lymphadenopathy. Reproductive: Prostate normal Other: No ascites. Musculoskeletal: No aggressive osseous lesion IMPRESSION: Chest Impression: 1. RIGHT upper lobe pulmonary nodule is indeterminate. No comparison available. 2. Ground-glass opacity and peripheral nodules in the RIGHT middle lobe appear post infectious or inflammatory. Abdomen / Pelvis Impression: 1. Clear progression of thrombus within the main  portal vein extending and expanding the portal vein to the level of the SMV confluence. Thrombus also likely within the LEFT and RIGHT portal vein. Difficult to distinguish tumor thrombus versus bland thrombus. 2. Individual lesions are difficult to define in the RIGHT hepatic lobe. There is overall impression of progression of disease in the RIGHT hepatic lobe with multiple ill-defined enhancing lesions. 3. Periportal and shotty retroperitoneal adenopathy similar to prior. Electronically Signed   By: Suzy Bouchard M.D.   On: 11/14/2015 15:27     ASSESSMENT & PLAN: 65 year old Caucasian male with past medical history of successfully treated hepatitis C, liver cirrhosis, history of ascites and encephalopathy, recurrent HCC  1. Recurrent HCC, initially stage II -I reviewed his previous image and outside medical records extensively, confirmed key findings with patient and his family members -He initially had a multifocal disease, status post TACE 3 times in 2015 -he now has developed symptomatic local recurrence, with a 5cm new lesion in segment 8 with direct invasion into portal vein, and a portocaval lymph node -I have reviewed his outside CT scan  -He has good liver function (Child-Pugh A), likely to be a candidate for systemic therapy. I discussed the treatment option of sorafenib, regorafenib and immunotherapy with nivolumab -the goal of therapy is palliative  -I recommend him to try sorafenib first, he has started one week ago low-dose at 200 mg twice daily, will increase to 400 mg in the morning, 200 mg in the evening for the next week, then increase to 4 dose at 400 mg twice daily next week - recent restaging CT scan findings discussed with patient, probable disease progression in the liver, indeterminate lung nodules.   2. Abdominal pain -Secondary to [facility] -much better now with oxycodone and OxyContin, is controlled overall -continue OxyContin 90 mg (3 tab) every 8 hours, and change to 80  mg every 8 hours, when he completed the '30mg'$  tablets  -He will continue oxycodone '20mg'$ , 1-2 tab q3h as needed for pain, will refill '30mg'$  tab next time   3. Liver cirrhosis secondary to hepatitis C and  alcohol, history of encephalopathy and ascites -He will continue follow-up with [facility], his ascites is well controlled with diuretics -He knows to use laxative, especially lactulose for his constipation  4. Hep C, successfully treated -Continue follow-up with [facility]   Plan -Increase sorafenib to 400 mg in the morning, continue 200 mg in the evening  for the next week, and increased to 400 mg twice daily next week  -Continue OxyContin and oxycodone, will change oxycodone to 30 mg as needed on next refill -F/U in 2 weeks with lab   All questions were answered. The patient knows to call the clinic with any problems, questions or concerns. I spent 25 minutes counseling the patient face to face. The total time spent in the appointment was 35 minutes and more than 50% was on counseling.     Truitt Merle, MD 11/18/2015

## 2015-11-21 ENCOUNTER — Encounter: Payer: Self-pay | Admitting: Hematology

## 2015-11-21 NOTE — Progress Notes (Signed)
Pt's Oxycodone '20mg'$  has been approved from 11/18/15 - 11/17/16.

## 2015-11-25 ENCOUNTER — Telehealth: Payer: Self-pay | Admitting: *Deleted

## 2015-11-25 DIAGNOSIS — C220 Liver cell carcinoma: Secondary | ICD-10-CM

## 2015-11-25 MED ORDER — OXYCODONE HCL 20 MG PO TABS: 1.0000 | ORAL_TABLET | ORAL | 0 refills | Status: DC | PRN

## 2015-11-25 MED FILL — oxyCODONE HCL 20 MG TABS: 20 | 9 days supply | Qty: 140 | Fill #0

## 2015-11-25 NOTE — Telephone Encounter (Signed)
FYI Walk in form received reads here to "pick up a prescription.  Dr. Burr Medico, I'm out of oxycodone.  Friday and Saturday night.  They may have saved my life.  I almost called 911.  I got 16 OxyContin, six xanax and started one Nexium twice a day.  Name OxyContin 30 mg."   Medication list which reads OxyContin 20.  Refill printed as previously ordered.  Informed him insurance may not cover a new dose, Dr. Burr Medico will discuss pain medicines at next F/U.   "I hoped she'd order thirty mg."  Current Authorization is for OxyContin 20 mg pills.  Next scheduled F/U 12-02-2015.

## 2015-11-27 ENCOUNTER — Telehealth: Payer: Self-pay | Admitting: *Deleted

## 2015-11-27 ENCOUNTER — Other Ambulatory Visit: Payer: Self-pay | Admitting: *Deleted

## 2015-11-27 ENCOUNTER — Encounter: Payer: Self-pay | Admitting: Hematology

## 2015-11-27 DIAGNOSIS — C220 Liver cell carcinoma: Secondary | ICD-10-CM

## 2015-11-27 MED ORDER — ALPRAZOLAM 1 MG PO TABS: 1.0000 mg | ORAL_TABLET | Freq: Two times a day (BID) | ORAL | 0 refills | Status: DC | PRN

## 2015-11-27 MED FILL — ALPRAZolam 1 MG TABS: 1 | 30 days supply | Qty: 60 | Fill #0

## 2015-11-27 NOTE — Progress Notes (Signed)
Received a message from Janice(RN) to contact patient regarding prior auth. I called and the patient's spouse answered and begin to talk about her spouse's pain medication and how it is not lasting him and is costing a ton of money. She then puts him on the phone and I ask how I may help him. He states he is waiting on a call from the RN to see if he can get more medication and is almost out. I asked patient if he was having difficulty paying for his medication and he states he was not. Relayed information back to [employer] to follow up.

## 2015-11-27 NOTE — Telephone Encounter (Signed)
Called patient and let him know that xanax has been called in.  Reviewed instructions and dosing for both oxycontin and oxycodone with patient.

## 2015-11-27 NOTE — Telephone Encounter (Signed)
Patient called and left message that he was out of his xanax and only had 4 oxycontin left.  Oxycontin '80mg'$  (one q 8hrs) #90 on 11-08-15 Xanax '1mg'$  (one BID,prn) #60 on 10-11-15 Oxycodone '20mg'$     #140 on 11-25-15    Called patient back and spoke with [name].  He has stepped out for a minute.  She said Dr. Burr Medico told him to take the oxycontin 3-4 times a day.  I told her the oxycontin was only every 8hrs - 3 times a day and the oxycodone was every 3hrs as needed, so he should have plenty of both medications.  They will be here on Monday to discuss pain medications, but in the meantime request the xanax to be called into [facility]

## 2015-12-01 NOTE — Progress Notes (Signed)
[facility]  [phone] [phone]  Clinic Follow up Note   Patient Care Team: Daneil Dolin, MD as Attending Physician (Gastroenterology) Antonietta Jewel, MD as Referring Physician (Internal Medicine) Truitt Merle, MD as Consulting Physician (Hematology) [name], CRNP as Nurse Practitioner (Nurse Practitioner) 12/02/2015   CHIEF COMPLAINTS:  Follow up recurrent [place]   Oncology History   Hepatocellular carcinoma ([place])   Staging form: Liver (Excluding Intrahepatic Bile Ducts), AJCC 7th Edition   - Clinical stage from 04/16/2012: Stage II (T2(m), N0, M0) - Signed by Truitt Merle, MD on 10/12/2015   - Pathologic stage from 04/16/2013: Stage II (T2, N0, cM0) - Signed by Truitt Merle, MD on 10/12/2015      Hepatocellular carcinoma ([place])   09/14/2012 Tumor Marker    AFP 28.9      04/16/2013 Imaging    Abdominal MRI with and without contrast reviewed multifocal (4) liver lesions in both left and right lobes, most consistent with [place], largest measuring 2.7 cm      04/16/2013 Initial Diagnosis    Hepatocellular carcinoma ([place])      04/28/2013 Procedure    Right TACE with lipiodol       06/15/2013 Procedure    Left TACE with [facility]       07/14/2013 Procedure    Right TACE with [facility]      09/05/2015 Imaging    CT abdomen with and without contrast showed a new 5.0 x 2.3 cm mass in the hepatic segment 8, invading and occluding the right anterior portal vein, most compatible with [place]. A portacaval node measuring 3.0 x 1.4 cm, previously 2.9 x 1.1 cm.      10/11/2015 Tumor Marker    AFP 5.3      11/11/2015 -  Chemotherapy    sorafenib '200mg'$  bid started on 11/11/2015, increased to '400mg'$  bid in 2 weeks        HISTORY OF PRESENTING ILLNESS (10/11/2015):  Kevin Dillon 65 y.o. male with past medical history of treated hepatitis C, liver cirrhosis, and hepatocellular carcinoma is here because of recurrent hepatocellular carcinoma. He is accompanied by his  significant other Kevin Dillon and her sister.  He was diagnosed with hepatocellular carcinoma in 2015, his initial image result is not available and staging is unknown. He was seen by interventional radiologist Dr. Delice Lesch at [facility] in [place] and underwent TACE procedure 3 times in 2015. He was subsequently followed, and recently repeated CT scan showed a new 5.0 cm mass in segment 8, with portal vein invasion. He was felt not to be a candidate for further liver targeted therapy, and was referred to [place] for further systemic therapy.  He complains about mid epigastric pain for the past 2 years, which significantly improved after his TACE procedure in 2015. It has been getting worse lately in the past 6 months. He states he gets about 7-8 out of 10, persistent, he has been taking oxycodone 20 mg every 6 hours, but his pain is not well controlled. He is quite fatigued, is low appetite. He last 40 lbs in the pat 4 months. He is able to function at home, in trying to remain to be physically active.  His hepatitis C was successfully treated. He has history of hepatitis C, complicated with ascites and encephalopathy, but it has been well controlled with medical management.   CURRENT THERAPY: sorafenib '200mg'$  bid started on 11/11/2015, tried '400mg'$  bid but could not tolerate, changed to '400mg'$  in am  and '200mg'$  pm from 12/04/2015  INTERIM HISTORY: Kevin Dillon returns for follow up. He started sorafenib '400mg'$  twice daily, but developed severe diarrhea, moderate nausea and vomiting, he changed back to '200mg'$  bid 2 days ago, tolerating well  He still has moderate abdominal pain, on oxycontine '80mg'$  tid, and takes oxycodone 20, 1-2 tab every 3 hours, average 10-15 tab a day  Could not sleep well, takes xanax '2mg'$  and oxycodone for sleep    MEDICAL HISTORY:  Past Medical History:  Diagnosis Date  . Cholelithiasis   . Chronic hepatitis C ([place])   . Chronic knee pain   . Chronic left shoulder pain   . CVA (cerebral  infarction)   . Gout   . Hepatitis C    genotype 1b.  pt has been vaccinated against hep A and B.  . Hypertension   . Osteoarthritis   . Schatzki's ring   . Tubular adenoma of colon 12/2011    SURGICAL HISTORY: Past Surgical History:  Procedure Laterality Date  . AMPUTATION Left 09/17/2012   Procedure: SMALL FINGER EXTENSOR TENDON REPAIR; METACARPAL LEVEL AMPUTATION RING FINGER; PROXIMAL PHALANX LEVEL AMPUTATION LONG FINGER;  Surgeon: Schuyler Amor, MD;  Location: [facility];  Service: Orthopedics;  Laterality: Left;  . Arm surgery     right/plate in arm  . COLONOSCOPY WITH ESOPHAGOGASTRODUODENOSCOPY (EGD)  12/30/2011   RMR: Noncritical Schatzki's ring;  Hiatal hernia, Tubular ADENOMA removed from splenic flexure, otherwise normal colonoscopy  . LEG SURGERY     left  . WOUND EXPLORATION Left 09/17/2012   Procedure: WOUND EXPLORATION;  Surgeon: Schuyler Amor, MD;  Location: [facility];  Service: Orthopedics;  Laterality: Left;    SOCIAL HISTORY: Social History   Social History  . Marital status: Single    Spouse name: N/A  . Number of children: 0  . Years of education: N/A   Occupational History  . disabled    Social History Main Topics  . Smoking status: Former Smoker    Packs/day: 0.50    Years: 40.00    Types: Cigarettes    Quit date: 01/19/2012  . Smokeless tobacco: Never Used     Comment: Smokes 1/2 pack of cigarettes daily  . Alcohol use No     Comment: HX 2 beers 3-4 days per week; QUIT DEC 2013  . Drug use: No     Comment: Hx cocaine yrs ago, Last marijuana OCT 2013  . Sexual activity: Not Currently    Partners: Female   Other Topics Concern  . Not on file   Social History Narrative   Lives w/ significant other, [name]    FAMILY HISTORY: Family History  Problem Relation Age of Onset  . Cirrhosis Father 16  . Cancer Father     liver cancer   . Lung cancer Mother 24  . Colon cancer Neg Hx     ALLERGIES:  has No Known  Allergies.  MEDICATIONS:  Current Outpatient Prescriptions  Medication Sig Dispense Refill  . ALPRAZolam (XANAX) 1 MG tablet Take 1 tablet (1 mg total) by mouth 2 (two) times daily as needed for anxiety. 60 tablet 0  . docusate sodium (COLACE) 100 MG capsule Take 1 capsule (100 mg total) by mouth 2 (two) times daily as needed. 60 capsule 5  . hydrALAZINE (APRESOLINE) 25 MG tablet Take 50 mg by mouth daily.     . megestrol (MEGACE) 40 MG/ML suspension Take 400 mg by mouth daily.     . Multiple Vitamins-Minerals (  CENTRUM SILVER PO) Take 1 tablet by mouth daily.     Marland Kitchen oxyCODONE (OXYCONTIN) 80 mg 12 hr tablet Take 1 tablet (80 mg total) by mouth every 8 (eight) hours. 90 tablet 0  . PARoxetine (PAXIL) 20 MG tablet Take 20 mg by mouth every morning.     . polyethylene glycol (MIRALAX / GLYCOLAX) packet Take 17 g by mouth daily as needed (constipation). 30 each 11  . prochlorperazine (COMPAZINE) 10 MG tablet Take 1 tablet (10 mg total) by mouth every 8 (eight) hours as needed for nausea or vomiting. 30 tablet 1  . SORAfenib (NEXAVAR) 200 MG tablet Take 2 tablets (400 mg total) by mouth 2 (two) times daily. Give on an empty stomach 1 hour before or 2 hours after meals. (Patient taking differently: Take 400 mg by mouth 2 (two) times daily. Give on an empty stomach 1 hour before or 2 hours after meals. ) 120 tablet 0  . LORazepam (ATIVAN) 1 MG tablet Take 1 tablet (1 mg total) by mouth at bedtime. 30 tablet 0  . oxyCODONE (OXYCONTIN) 80 mg 12 hr tablet Take 1 tablet (80 mg total) by mouth every 12 (twelve) hours. 90 tablet 0  . oxycodone (ROXICODONE) 30 MG immediate release tablet Take 1 tablet (30 mg total) by mouth every 3 (three) hours as needed for pain. 120 tablet 0   No current facility-administered medications for this visit.     REVIEW OF SYSTEMS:   Constitutional: Denies fevers, chills or abnormal night sweats, (+) fatigue  Eyes: Denies blurriness of vision, double vision or watery  eyes Ears, nose, mouth, throat, and face: Denies mucositis or sore throat Respiratory: Denies cough, dyspnea or wheezes Cardiovascular: Denies palpitation, chest discomfort or lower extremity swelling Gastrointestinal:  Denies nausea, heartburn or change in bowel habits, (+) abdominal pain  Skin: Denies abnormal skin rashes Lymphatics: Denies new lymphadenopathy or easy bruising Neurological:Denies numbness, tingling or new weaknesses Behavioral/Psych: Mood is stable, no new changes  All other systems were reviewed with the patient and are negative.  PHYSICAL EXAMINATION: ECOG PERFORMANCE STATUS: 2  Vitals:   12/02/15 0852  BP: (!) 149/82  Pulse: 88  Resp: 17  Temp: 97.7 F (36.5 C)   Filed Weights   12/02/15 0852  Weight: 151 lb (68.5 kg)   GENERAL:alert, in mild distress due to his pain SKIN: skin color, texture, turgor are normal, no rashes or significant lesions EYES: normal, conjunctiva are pink and non-injected, sclera clear OROPHARYNX:no exudate, no erythema and lips, buccal mucosa, and tongue normal  NECK: supple, thyroid normal size, non-tender, without nodularity LYMPH:  no palpable lymphadenopathy in the cervical, axillary or inguinal LUNGS: clear to auscultation and percussion with normal breathing effort HEART: regular rate & rhythm and no murmurs and no lower extremity edema ABDOMEN:abdomen soft, non-tender and normal bowel sounds, liver is enlarged, palpable 3-4cm below ribcage, mild tenderness, no significant ascites  Musculoskeletal:no cyanosis of digits and no clubbing  PSYCH: alert & oriented x 3 with fluent speech NEURO: no focal motor/sensory deficits  LABORATORY DATA:  I have reviewed the data as listed CBC Latest Ref Rng & Units 12/02/2015 11/14/2015 11/01/2015  WBC 4.0 - 10.3 10e3/uL 9.1 8.2 8.9  Hemoglobin 13.0 - 17.1 g/dL 12.2(L) 11.9(L) 12.0(L)  Hematocrit 38.4 - 49.9 % 35.2(L) 34.5(L) 35.9(L)  Platelets 140 - 400 10e3/uL 145 219 193   CMP  Latest Ref Rng & Units 12/02/2015 11/14/2015 11/01/2015  Glucose 70 - 140 mg/dl 124 95 100  BUN  7.0 - 26.0 mg/dL 15.7 14.5 12.6  Creatinine 0.7 - 1.3 mg/dL 1.0 1.1 1.1  Sodium 136 - 145 mEq/L 137 139 140  Potassium 3.5 - 5.1 mEq/L 3.6 4.6 4.1  Chloride 96 - 112 mEq/L - - -  CO2 22 - 29 mEq/L 20(L) 21(L) 26  Calcium 8.4 - 10.4 mg/dL 8.8 9.4 8.9  Total Protein 6.4 - 8.3 g/dL 7.4 8.1 7.1  Total Bilirubin 0.20 - 1.20 mg/dL 0.78 0.44 0.43  Alkaline Phos 40 - 150 U/L 199(H) 217(H) 151(H)  AST 5 - 34 U/L 44(H) 43(H) 37(H)  ALT 0 - 55 U/L 37 27 26   AFP: 09/14/2012: 28.9 10/11/2015: 5.3 11/01/2015: 3.7   RADIOGRAPHIC STUDIES: I have personally reviewed the radiological images as listed and agreed with the findings in the report. Ct Chest W Contrast  Result Date: 11/14/2015 CLINICAL DATA:  Prostate carcinoma diagnosed 2013. Oral chemotherapy ongoing. Hepatitis-C. EXAM: CT CHEST WITH CONTRAST CT ABDOMEN AND PELVIS WITH AND WITHOUT CONTRAST TECHNIQUE: Multidetector CT imaging of the chest was performed during intravenous contrast administration. Multidetector CT imaging of the abdomen and pelvis was performed following the standard protocol before and during bolus administration of intravenous contrast. CONTRAST:  159m ISOVUE-300 IOPAMIDOL (ISOVUE-300) INJECTION 61% COMPARISON:  Outside CT 09/05/2015 FINDINGS: CT CHEST FINDINGS Cardiovascular: No acute vascular findings. No central pulmonary embolism. No pericardial fluid. Mediastinum/Nodes: No axillary or supraclavicular adenopathy. No mediastinal hilar adenopathy. Mild thickening of the distal esophagus. Lungs/Pleura: Within knee RIGHT upper lobe 6 mm nodule (image 14, series 5. No comparison available. Musculoskeletal: No aggressive osseous lesion. CT ABDOMEN AND PELVIS FINDINGS Hepatobiliary: The RIGHT hepatic lobe is lobular with ill-defined enhancing lesions. The degree enhanced appears increased from comparison CT. There is clear progression of  the thrombus of the main portal vein. The entirety of the portal vein is expanded to the level of the SMV with relative low attenuation tissue. The portal vein is expanded 2 point 2 cm at the level of the confluence of the splenic vein and SMV (image 37, series 2). No clear enhancement of the central portion of this thrombus extension. LEFT and RIGHT portal veins are expanded with poor perfusion. Hepatic veins are patent. LEFT hepatic lobe appears free of disease. The gallbladder is collapsed. Mild periportal adenopathy is similar prior. Pancreas: Pancreas is normal. No ductal dilatation. No pancreatic inflammation. Spleen: Spleen is normal.  Splenic vein is patent. Adrenals/urinary tract: Adrenal glands and kidneys are normal. The ureters and bladder normal. Stomach/Bowel: Stomach, small bowel, appendix, and cecum are normal. The colon and rectosigmoid colon are normal. Vascular/Lymphatic: Abdominal aorta is normal caliber. There is no retroperitoneal or periportal lymphadenopathy. No pelvic lymphadenopathy. Reproductive: Prostate normal Other: No ascites. Musculoskeletal: No aggressive osseous lesion IMPRESSION: Chest Impression: 1. RIGHT upper lobe pulmonary nodule is indeterminate. No comparison available. 2. Ground-glass opacity and peripheral nodules in the RIGHT middle lobe appear post infectious or inflammatory. Abdomen / Pelvis Impression: 1. 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 Thrombus also likely within the LEFT and RIGHT portal vein. Difficult to distinguish tumor thrombus versus bland thrombus. 2. Individual lesions are difficult to define in the RIGHT hepatic lobe. There is overall impression of progression of disease in the RIGHT hepatic lobe with multiple ill-defined enhancing lesions. 3. Periportal and shotty retroperitoneal adenopathy similar to prior. Electronically Signed   By: SSuzy BouchardM.D.    On: 11/14/2015 15:27  Ct Abdomen Pelvis W Contrast  Result Date: 11/14/2015 CLINICAL DATA:  Prostate carcinoma diagnosed 2013. Oral chemotherapy ongoing. Hepatitis-C. EXAM: CT CHEST WITH CONTRAST CT ABDOMEN AND PELVIS WITH AND WITHOUT CONTRAST TECHNIQUE: Multidetector CT imaging of the chest was performed during intravenous contrast administration. Multidetector CT imaging of the abdomen and pelvis was performed following the standard protocol before and during bolus administration of intravenous contrast. CONTRAST:  122m ISOVUE-300 IOPAMIDOL (ISOVUE-300) INJECTION 61% COMPARISON:  Outside CT 09/05/2015 FINDINGS: CT CHEST FINDINGS Cardiovascular: No acute vascular findings. No central pulmonary embolism. No pericardial fluid. Mediastinum/Nodes: No axillary or supraclavicular adenopathy. No mediastinal hilar adenopathy. Mild thickening of the distal esophagus. Lungs/Pleura: Within knee RIGHT upper lobe 6 mm nodule (image 14, series 5. No comparison available. Musculoskeletal: No aggressive osseous lesion. CT ABDOMEN AND PELVIS FINDINGS Hepatobiliary: The RIGHT hepatic lobe is lobular with ill-defined enhancing lesions. The degree enhanced appears increased from comparison CT. There is clear progression of the thrombus of the main portal vein. The entirety of the portal vein is expanded to the level of the SMV with relative low attenuation tissue. The portal vein is expanded 2 point 2 cm at the level of the confluence of the splenic vein and SMV (image 37, series 2). No clear enhancement of the central portion of this thrombus extension. LEFT and RIGHT portal veins are expanded with poor perfusion. Hepatic veins are patent. LEFT hepatic lobe appears free of disease. The gallbladder is collapsed. Mild periportal adenopathy is similar prior. Pancreas: Pancreas is normal. No ductal dilatation. No pancreatic inflammation. Spleen: Spleen is normal.  Splenic vein is patent. Adrenals/urinary tract: Adrenal glands and  kidneys are normal. The ureters and bladder normal. Stomach/Bowel: Stomach, small bowel, appendix, and cecum are normal. The colon and rectosigmoid colon are normal. Vascular/Lymphatic: Abdominal aorta is normal caliber. There is no retroperitoneal or periportal lymphadenopathy. No pelvic lymphadenopathy. Reproductive: Prostate normal Other: No ascites. Musculoskeletal: No aggressive osseous lesion IMPRESSION: Chest Impression: 1. RIGHT upper lobe pulmonary nodule is indeterminate. No comparison available. 2. Ground-glass opacity and peripheral nodules in the RIGHT middle lobe appear post infectious or inflammatory. Abdomen / Pelvis Impression: 1. 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 Thrombus also likely within the LEFT and RIGHT portal vein. Difficult to distinguish tumor thrombus versus bland thrombus. 2. Individual lesions are difficult to define in the RIGHT hepatic lobe. There is overall impression of progression of disease in the RIGHT hepatic lobe with multiple ill-defined enhancing lesions. 3. Periportal and shotty retroperitoneal adenopathy similar to prior. Electronically Signed   By: SSuzy BouchardM.D.   On: 11/14/2015 15:27     ASSESSMENT & PLAN: 65year old Caucasian male with past medical history of successfully treated hepatitis C, liver cirrhosis, history of ascites and encephalopathy, recurrent HCC  1. Recurrent HCC, initially stage II -I reviewed his previous image and outside medical records extensively, confirmed key findings with patient and his family members -He initially had a multifocal disease, status post TACE 3 times in 2015 -he now has developed symptomatic local recurrence, with a 5cm new lesion in segment 8 with direct invasion into portal vein, and a portocaval lymph node -I have reviewed his outside CT scan  -He has good liver function (Child-Pugh A), likely to be a candidate  for systemic therapy. I discussed the treatment option of sorafenib, regorafenib and immunotherapy with nivolumab -the goal of therapy is palliative  -He has started to  sorafenib, tolerated 200 mg twice a day well, but could not tolerate 400 twice a day, I suggest him to change to 400 in the morning, and 200 in the evening, starting a few days   2. Abdominal pain -Secondary to [facility] -much better now with oxycodone and OxyContin, he is on very high dose -continue OxyContin 80 mg every 8 hours, and I will change oxycodone from '20mg'$  to '30mg'$  every 3-4 hours, I gave him a prescription of 120 tab today   3. 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 Hep C, successfully treated -Continue follow-up with [facility]  5. Anxiety and insomnia  -He has Xanax for anxiety, does not feel very effective for insomnia -I give him prescription of Iressa pain 1 mg at bedtime as needed for insomnia, he knows not to take with Xanax together  Plan -decrease sorafenib to 400 mg in the morning, and 200 mg in the evening in a few days  -Continue OxyContin and oxycodone, I filled his oxycodone '30mg'$  #120 today -I give him a prescription of lorazepam 1 mg as needed for insomnia, henot to take lorazepam, Xanax and oxycodone together -F/U in 2 weeks with lab   All questions were answered. The patient knows to call the clinic with any problems, questions or concerns. I spent 25 minutes counseling the patient face to face. The total time spent in the appointment was 35 minutes and more than 50% was on counseling.     Truitt Merle, MD 12/02/2015

## 2015-12-02 ENCOUNTER — Other Ambulatory Visit (HOSPITAL_BASED_OUTPATIENT_CLINIC_OR_DEPARTMENT_OTHER): Payer: Medicare Other

## 2015-12-02 ENCOUNTER — Telehealth: Payer: Self-pay | Admitting: Hematology

## 2015-12-02 ENCOUNTER — Encounter: Payer: Self-pay | Admitting: Hematology

## 2015-12-02 ENCOUNTER — Ambulatory Visit (HOSPITAL_BASED_OUTPATIENT_CLINIC_OR_DEPARTMENT_OTHER): Payer: Medicare Other | Admitting: Hematology

## 2015-12-02 VITALS — BP 149/82 | HR 88 | Temp 97.7°F | Resp 17 | Ht 73.0 in | Wt 151.0 lb

## 2015-12-02 DIAGNOSIS — C220 Liver cell carcinoma: Secondary | ICD-10-CM

## 2015-12-02 DIAGNOSIS — K7469 Other cirrhosis of liver: Secondary | ICD-10-CM | POA: Diagnosis not present

## 2015-12-02 DIAGNOSIS — Z8619 Personal history of other infectious and parasitic diseases: Secondary | ICD-10-CM

## 2015-12-02 DIAGNOSIS — F419 Anxiety disorder, unspecified: Secondary | ICD-10-CM

## 2015-12-02 DIAGNOSIS — R109 Unspecified abdominal pain: Secondary | ICD-10-CM

## 2015-12-02 DIAGNOSIS — G893 Neoplasm related pain (acute) (chronic): Secondary | ICD-10-CM | POA: Diagnosis not present

## 2015-12-02 DIAGNOSIS — G4700 Insomnia, unspecified: Secondary | ICD-10-CM

## 2015-12-02 LAB — CBC WITH DIFFERENTIAL/PLATELET
BASO%: 0.2 % (ref 0.0–2.0)
Basophils Absolute: 0 10*3/uL (ref 0.0–0.1)
EOS ABS: 0.1 10*3/uL (ref 0.0–0.5)
EOS%: 1 % (ref 0.0–7.0)
HEMATOCRIT: 35.2 % — AB (ref 38.4–49.9)
HEMOGLOBIN: 12.2 g/dL — AB (ref 13.0–17.1)
LYMPH#: 2.1 10*3/uL (ref 0.9–3.3)
LYMPH%: 23 % (ref 14.0–49.0)
MCH: 30.2 pg (ref 27.2–33.4)
MCHC: 34.7 g/dL (ref 32.0–36.0)
MCV: 87.1 fL (ref 79.3–98.0)
MONO#: 0.7 10*3/uL (ref 0.1–0.9)
MONO%: 8.2 % (ref 0.0–14.0)
NEUT%: 67.6 % (ref 39.0–75.0)
NEUTROS ABS: 6.1 10*3/uL (ref 1.5–6.5)
Platelets: 145 10*3/uL (ref 140–400)
RBC: 4.04 10*6/uL — ABNORMAL LOW (ref 4.20–5.82)
RDW: 13.8 % (ref 11.0–14.6)
WBC: 9.1 10*3/uL (ref 4.0–10.3)

## 2015-12-02 LAB — COMPREHENSIVE METABOLIC PANEL
ALBUMIN: 3 g/dL — AB (ref 3.5–5.0)
ALK PHOS: 199 U/L — AB (ref 40–150)
ALT: 37 U/L (ref 0–55)
AST: 44 U/L — AB (ref 5–34)
Anion Gap: 12 mEq/L — ABNORMAL HIGH (ref 3–11)
BILIRUBIN TOTAL: 0.78 mg/dL (ref 0.20–1.20)
BUN: 15.7 mg/dL (ref 7.0–26.0)
CALCIUM: 8.8 mg/dL (ref 8.4–10.4)
CO2: 20 mEq/L — ABNORMAL LOW (ref 22–29)
Chloride: 105 mEq/L (ref 98–109)
Creatinine: 1 mg/dL (ref 0.7–1.3)
EGFR: 83 mL/min/{1.73_m2} — ABNORMAL LOW (ref >90)
GLUCOSE: 124 mg/dL (ref 70–140)
Potassium: 3.6 mEq/L (ref 3.5–5.1)
SODIUM: 137 meq/L (ref 136–145)
TOTAL PROTEIN: 7.4 g/dL (ref 6.4–8.3)

## 2015-12-02 MED ORDER — LORAZEPAM 1 MG PO TABS: 1.0000 mg | ORAL_TABLET | Freq: Every day | ORAL | 0 refills | Status: DC

## 2015-12-02 MED ORDER — OXYCODONE HCL 30 MG PO TABS: 30.0000 mg | ORAL_TABLET | ORAL | 0 refills | Status: DC | PRN

## 2015-12-02 MED ORDER — OXYCODONE HCL ER 80 MG PO T12A: 80.0000 mg | EXTENDED_RELEASE_TABLET | Freq: Two times a day (BID) | ORAL | 0 refills | Status: DC

## 2015-12-02 MED FILL — LORazepam 1 MG TABS: 1 | 30 days supply | Qty: 30 | Fill #0

## 2015-12-02 MED FILL — oxyCODONE HCL 30 MG TABS: 30 | 15 days supply | Qty: 120 | Fill #0

## 2015-12-02 NOTE — Telephone Encounter (Signed)
Appointments scheduled per 12/02/15 los. AVS report and appointment schedule given to patient per 12/02/15 los.

## 2015-12-03 LAB — AFP TUMOR MARKER: AFP, SERUM, TUMOR MARKER: 3.7 ng/mL (ref 0.0–8.3)

## 2015-12-05 MED FILL — OxyCONTIN 80 MG T12A: 80 | 30 days supply | Qty: 60 | Fill #0

## 2015-12-06 DIAGNOSIS — H2513 Age-related nuclear cataract, bilateral: Secondary | ICD-10-CM | POA: Diagnosis not present

## 2015-12-13 ENCOUNTER — Telehealth: Payer: Self-pay | Admitting: *Deleted

## 2015-12-13 NOTE — Telephone Encounter (Signed)
Spoke with Carl Best regarding her request for refills of nausea medication and pain medication.  For nausea , patient has compazine.  He had this filled in October and had one refill, which [name] says he has not used.  So he may just call the pharmacy and use the refill.   For pain,  Dr. Burr Medico gave him oxycodone '80mg'$  q 12hrs #90 which is a 45 day supply on 12/02/15.  She also gave him oxycodone '30mg'$  (increased from '20mg'$ ) to take every 3-4 hrs.  #120.  Using this every 3hrs is a 15 day supply.  Which should last him until he sees Dr. Burr Medico this Tuesday 12/17/15.   [name] thinks he using this more often.  She is concerned about what his prognosis is and how to manage his pain.  She is coming with [name] on Tuesday.  She doesn't think Trisha will want to ask these questiions.  Let her know that she might want to discuss with [name] before coming.  If he wants her to his caretaker, then having this information is helpful for her to be able to care for him.  She very much appreciated our conversation and my help with this.  Let her know that Dr. Burr Medico is out of the office today, however she will return on Monday and their appointment is on Tuesday 12/17/15

## 2015-12-16 ENCOUNTER — Other Ambulatory Visit: Payer: Self-pay | Admitting: *Deleted

## 2015-12-16 MED ORDER — OXYCODONE HCL 30 MG PO TABS: 30.0000 mg | ORAL_TABLET | ORAL | 0 refills | Status: DC | PRN

## 2015-12-16 MED ORDER — PROCHLORPERAZINE MALEATE 10 MG PO TABS: 10.0000 mg | ORAL_TABLET | Freq: Three times a day (TID) | ORAL | 1 refills | Status: DC | PRN

## 2015-12-16 MED FILL — oxyCODONE HCL 30 MG TABS: 30 | 15 days supply | Qty: 120 | Fill #0

## 2015-12-16 MED FILL — PROCHLORPERAZINE 10 MG TAB: 10 | 10 days supply | Qty: 30 | Fill #0

## 2015-12-17 ENCOUNTER — Other Ambulatory Visit (HOSPITAL_BASED_OUTPATIENT_CLINIC_OR_DEPARTMENT_OTHER): Payer: Medicare Other

## 2015-12-17 ENCOUNTER — Telehealth: Payer: Self-pay | Admitting: Hematology

## 2015-12-17 ENCOUNTER — Encounter: Payer: Self-pay | Admitting: Hematology

## 2015-12-17 ENCOUNTER — Telehealth: Payer: Self-pay | Admitting: *Deleted

## 2015-12-17 ENCOUNTER — Ambulatory Visit (HOSPITAL_BASED_OUTPATIENT_CLINIC_OR_DEPARTMENT_OTHER): Payer: Medicare Other | Admitting: Hematology

## 2015-12-17 VITALS — BP 175/95 | HR 89 | Temp 98.1°F | Resp 17 | Ht 73.0 in | Wt 156.4 lb

## 2015-12-17 DIAGNOSIS — G893 Neoplasm related pain (acute) (chronic): Secondary | ICD-10-CM

## 2015-12-17 DIAGNOSIS — Z8619 Personal history of other infectious and parasitic diseases: Secondary | ICD-10-CM | POA: Diagnosis not present

## 2015-12-17 DIAGNOSIS — C220 Liver cell carcinoma: Secondary | ICD-10-CM

## 2015-12-17 DIAGNOSIS — K7469 Other cirrhosis of liver: Secondary | ICD-10-CM

## 2015-12-17 DIAGNOSIS — R109 Unspecified abdominal pain: Secondary | ICD-10-CM | POA: Diagnosis not present

## 2015-12-17 DIAGNOSIS — G4700 Insomnia, unspecified: Secondary | ICD-10-CM

## 2015-12-17 DIAGNOSIS — F419 Anxiety disorder, unspecified: Secondary | ICD-10-CM

## 2015-12-17 LAB — COMPREHENSIVE METABOLIC PANEL
ALBUMIN: 2.8 g/dL — AB (ref 3.5–5.0)
ALK PHOS: 263 U/L — AB (ref 40–150)
ALT: 38 U/L (ref 0–55)
ANION GAP: 10 meq/L (ref 3–11)
AST: 49 U/L — AB (ref 5–34)
BILIRUBIN TOTAL: 0.5 mg/dL (ref 0.20–1.20)
BUN: 11.8 mg/dL (ref 7.0–26.0)
CALCIUM: 9.4 mg/dL (ref 8.4–10.4)
CHLORIDE: 105 meq/L (ref 98–109)
CO2: 22 mEq/L (ref 22–29)
CREATININE: 1 mg/dL (ref 0.7–1.3)
EGFR: 80 mL/min/{1.73_m2} — ABNORMAL LOW (ref >90)
Glucose: 110 mg/dl (ref 70–140)
Potassium: 4.2 mEq/L (ref 3.5–5.1)
Sodium: 137 mEq/L (ref 136–145)
Total Protein: 7.4 g/dL (ref 6.4–8.3)

## 2015-12-17 LAB — CBC WITH DIFFERENTIAL/PLATELET
BASO%: 0.4 % (ref 0.0–2.0)
BASOS ABS: 0 10*3/uL (ref 0.0–0.1)
EOS%: 0.9 % (ref 0.0–7.0)
Eosinophils Absolute: 0.1 10*3/uL (ref 0.0–0.5)
HEMATOCRIT: 32.7 % — AB (ref 38.4–49.9)
HEMOGLOBIN: 10.9 g/dL — AB (ref 13.0–17.1)
LYMPH#: 2.1 10*3/uL (ref 0.9–3.3)
LYMPH%: 23.9 % (ref 14.0–49.0)
MCH: 29.7 pg (ref 27.2–33.4)
MCHC: 33.4 g/dL (ref 32.0–36.0)
MCV: 89.1 fL (ref 79.3–98.0)
MONO#: 0.7 10*3/uL (ref 0.1–0.9)
MONO%: 8.5 % (ref 0.0–14.0)
NEUT#: 5.8 10*3/uL (ref 1.5–6.5)
NEUT%: 66.3 % (ref 39.0–75.0)
PLATELETS: 304 10*3/uL (ref 140–400)
RBC: 3.67 10*6/uL — ABNORMAL LOW (ref 4.20–5.82)
RDW: 14.1 % (ref 11.0–14.6)
WBC: 8.8 10*3/uL (ref 4.0–10.3)

## 2015-12-17 NOTE — Progress Notes (Signed)
Kevin Dillon  [phone] [phone]  Clinic Follow up Note   Patient Care Team: Daneil Dolin, MD as Attending Physician (Gastroenterology) Antonietta Jewel, MD as Referring Physician (Internal Medicine) Truitt Merle, MD as Consulting Physician (Hematology) [name], CRNP as Nurse Practitioner (Nurse Practitioner) 12/17/2015   CHIEF COMPLAINTS:  Follow up recurrent [place]   Oncology History   Hepatocellular carcinoma ([place])   Staging form: Liver (Excluding Intrahepatic Bile Ducts), AJCC 7th Edition   - Clinical stage from 04/16/2012: Stage II (T2(m), N0, M0) - Signed by Truitt Merle, MD on 10/12/2015   - Pathologic stage from 04/16/2013: Stage II (T2, N0, cM0) - Signed by Truitt Merle, MD on 10/12/2015      Hepatocellular carcinoma ([place])   09/14/2012 Tumor Marker    AFP 28.9      04/16/2013 Imaging    Abdominal MRI with and without contrast reviewed multifocal (4) liver lesions in both left and right lobes, most consistent with [place], largest measuring 2.7 cm      04/16/2013 Initial Diagnosis    Hepatocellular carcinoma ([place])      04/28/2013 Procedure    Right TACE with lipiodol       06/15/2013 Procedure    Left TACE with [facility]       07/14/2013 Procedure    Right TACE with [facility]      09/05/2015 Imaging    CT abdomen with and without contrast showed a new 5.0 x 2.3 cm mass in the hepatic segment 8, invading and occluding the right anterior portal vein, most compatible with [place]. A portacaval node measuring 3.0 x 1.4 cm, previously 2.9 x 1.1 cm.      10/11/2015 Tumor Marker    AFP 5.3      11/11/2015 -  Chemotherapy    sorafenib '200mg'$  bid started on 11/11/2015, increased to '400mg'$  bid in 2 weeks        HISTORY OF PRESENTING ILLNESS (10/11/2015):  Kevin Dillon 65 y.o. male with past medical history of treated hepatitis C, liver cirrhosis, and hepatocellular carcinoma is here because of recurrent hepatocellular carcinoma. He is accompanied by his  significant other [name] and her sister.  He was diagnosed with hepatocellular carcinoma in 2015, his initial image result is not available and staging is unknown. He was seen by interventional radiologist Dr. Delice Lesch at [facility] in [place] and underwent TACE procedure 3 times in 2015. He was subsequently followed, and recently repeated CT scan showed a new 5.0 cm mass in segment 8, with portal vein invasion. He was felt not to be a candidate for further liver targeted therapy, and was referred to [place] for further systemic therapy.  He complains about mid epigastric pain for the past 2 years, which significantly improved after his TACE procedure in 2015. It has been getting worse lately in the past 6 months. He states he gets about 7-8 out of 10, persistent, he has been taking oxycodone 20 mg every 6 hours, but his pain is not well controlled. He is quite fatigued, is low appetite. He last 40 lbs in the pat 4 months. He is able to function at home, in trying to remain to be physically active.  His hepatitis C was successfully treated. He has history of hepatitis C, complicated with ascites and encephalopathy, but it has been well controlled with medical management.   CURRENT THERAPY: sorafenib '200mg'$  bid started on 11/11/2015, tried '400mg'$  bid but could not tolerate, changed to '400mg'$  in am  and '200mg'$  pm from 12/04/2015  INTERIM HISTORY: [name] returns for follow up. He is tolerating sorafenib '300mg'$ /day well overall, still has mild nausea, no skin rash, or other noticeable side effects. His abdominal pain overall better, but he is still taking oxycodone frequently, sometimes every 2-3 hours, especially at night, he does not remember the total pill number he takes, he is also compliant with OxyContin 80 mg every 6 hour, he runs out medication earlier than he should  His appetite is moderate, is well overall. His weight is stable.   MEDICAL HISTORY:  Past Medical History:  Diagnosis Date  .  Cholelithiasis   . Chronic hepatitis C ([place])   . Chronic knee pain   . Chronic left shoulder pain   . CVA (cerebral infarction)   . Gout   . Hepatitis C    genotype 1b.  pt has been vaccinated against hep A and B.  . Hypertension   . Osteoarthritis   . Schatzki's ring   . Tubular adenoma of colon 12/2011    SURGICAL HISTORY: Past Surgical History:  Procedure Laterality Date  . AMPUTATION Left 09/17/2012   Procedure: SMALL FINGER EXTENSOR TENDON REPAIR; METACARPAL LEVEL AMPUTATION RING FINGER; PROXIMAL PHALANX LEVEL AMPUTATION LONG FINGER;  Surgeon: Schuyler Amor, MD;  Location: [facility];  Service: Orthopedics;  Laterality: Left;  . Arm surgery     right/plate in arm  . COLONOSCOPY WITH ESOPHAGOGASTRODUODENOSCOPY (EGD)  12/30/2011   RMR: Noncritical Schatzki's ring;  Hiatal hernia, Tubular ADENOMA removed from splenic flexure, otherwise normal colonoscopy  . LEG SURGERY     left  . WOUND EXPLORATION Left 09/17/2012   Procedure: WOUND EXPLORATION;  Surgeon: Schuyler Amor, MD;  Location: [facility];  Service: Orthopedics;  Laterality: Left;    SOCIAL HISTORY: Social History   Social History  . Marital status: Single    Spouse name: N/A  . Number of children: 0  . Years of education: N/A   Occupational History  . disabled    Social History Main Topics  . Smoking status: Former Smoker    Packs/day: 0.50    Years: 40.00    Types: Cigarettes    Quit date: 01/19/2012  . Smokeless tobacco: Never Used     Comment: Smokes 1/2 pack of cigarettes daily  . Alcohol use No     Comment: HX 2 beers 3-4 days per week; QUIT DEC 2013  . Drug use: No     Comment: Hx cocaine yrs ago, Last marijuana OCT 2013  . Sexual activity: Not Currently    Partners: Female   Other Topics Concern  . Not on file   Social History Narrative   Lives w/ significant other, [name]    FAMILY HISTORY: Family History  Problem Relation Age of Onset  . Cirrhosis Father 68  . Cancer Father       liver cancer   . Lung cancer Mother 30  . Colon cancer Neg Hx     ALLERGIES:  has No Known Allergies.  MEDICATIONS:  Current Outpatient Prescriptions  Medication Sig Dispense Refill  . ALPRAZolam (XANAX) 1 MG tablet Take 1 tablet (1 mg total) by mouth 2 (two) times daily as needed for anxiety. 60 tablet 0  . docusate sodium (COLACE) 100 MG capsule Take 1 capsule (100 mg total) by mouth 2 (two) times daily as needed. 60 capsule 5  . hydrALAZINE (APRESOLINE) 25 MG tablet Take 50 mg by mouth daily.     Marland Kitchen LORazepam (  ATIVAN) 1 MG tablet Take 1 tablet (1 mg total) by mouth at bedtime. 30 tablet 0  . megestrol (MEGACE) 40 MG/ML suspension Take 400 mg by mouth daily.     . Multiple Vitamins-Minerals (CENTRUM SILVER PO) Take 1 tablet by mouth daily.     Marland Kitchen oxyCODONE (OXYCONTIN) 80 mg 12 hr tablet Take 1 tablet (80 mg total) by mouth every 8 (eight) hours. 90 tablet 0  . oxyCODONE (OXYCONTIN) 80 mg 12 hr tablet Take 1 tablet (80 mg total) by mouth every 12 (twelve) hours. 90 tablet 0  . oxycodone (ROXICODONE) 30 MG immediate release tablet Take 1 tablet (30 mg total) by mouth every 3 (three) hours as needed for pain. 120 tablet 0  . PARoxetine (PAXIL) 20 MG tablet Take 20 mg by mouth every morning.     . polyethylene glycol (MIRALAX / GLYCOLAX) packet Take 17 g by mouth daily as needed (constipation). 30 each 11  . prochlorperazine (COMPAZINE) 10 MG tablet Take 1 tablet (10 mg total) by mouth every 8 (eight) hours as needed for nausea or vomiting. 30 tablet 1  . SORAfenib (NEXAVAR) 200 MG tablet Take 2 tablets (400 mg total) by mouth 2 (two) times daily. Give on an empty stomach 1 hour before or 2 hours after meals. (Patient taking differently: Take 400 mg by mouth 2 (two) times daily. Give on an empty stomach 1 hour before or 2 hours after meals. ) 120 tablet 0   No current facility-administered medications for this visit.     REVIEW OF SYSTEMS:   Constitutional: Denies fevers, chills or  abnormal night sweats, (+) fatigue  Eyes: Denies blurriness of vision, double vision or watery eyes Ears, nose, mouth, throat, and face: Denies mucositis or sore throat Respiratory: Denies cough, dyspnea or wheezes Cardiovascular: Denies palpitation, chest discomfort or lower extremity swelling Gastrointestinal:  Denies nausea, heartburn or change in bowel habits, (+) abdominal pain  Skin: Denies abnormal skin rashes Lymphatics: Denies new lymphadenopathy or easy bruising Neurological:Denies numbness, tingling or new weaknesses Behavioral/Psych: Mood is stable, no new changes  All other systems were reviewed with the patient and are negative.  PHYSICAL EXAMINATION: ECOG PERFORMANCE STATUS: 2  Vitals:   12/17/15 1342  BP: (!) 175/95  Pulse: 89  Resp: 17  Temp: 98.1 F (36.7 C)   Filed Weights   12/17/15 1342  Weight: 156 lb 6.4 oz (70.9 kg)   GENERAL:alert, in mild distress due to his pain SKIN: skin color, texture, turgor are normal, no rashes or significant lesions EYES: normal, conjunctiva are pink and non-injected, sclera clear OROPHARYNX:no exudate, no erythema and lips, buccal mucosa, and tongue normal  NECK: supple, thyroid normal size, non-tender, without nodularity LYMPH:  no palpable lymphadenopathy in the cervical, axillary or inguinal LUNGS: clear to auscultation and percussion with normal breathing effort HEART: regular rate & rhythm and no murmurs and no lower extremity edema ABDOMEN:abdomen soft, non-tender and normal bowel sounds, liver is enlarged, palpable 3-4cm below ribcage, mild tenderness, no significant ascites  Musculoskeletal:no cyanosis of digits and no clubbing  PSYCH: alert & oriented x 3 with fluent speech NEURO: no focal motor/sensory deficits  LABORATORY DATA:  I have reviewed the data as listed CBC Latest Ref Rng & Units 12/17/2015 12/02/2015 11/14/2015  WBC 4.0 - 10.3 10e3/uL 8.8 9.1 8.2  Hemoglobin 13.0 - 17.1 g/dL 10.9(L) 12.2(L) 11.9(L)    Hematocrit 38.4 - 49.9 % 32.7(L) 35.2(L) 34.5(L)  Platelets 140 - 400 10e3/uL 304 145 219  CMP Latest Ref Rng & Units 12/17/2015 12/02/2015 11/14/2015  Glucose 70 - 140 mg/dl 110 124 95  BUN 7.0 - 26.0 mg/dL 11.8 15.7 14.5  Creatinine 0.7 - 1.3 mg/dL 1.0 1.0 1.1  Sodium 136 - 145 mEq/L 137 137 139  Potassium 3.5 - 5.1 mEq/L 4.2 3.6 4.6  Chloride 96 - 112 mEq/L - - -  CO2 22 - 29 mEq/L 22 20(L) 21(L)  Calcium 8.4 - 10.4 mg/dL 9.4 8.8 9.4  Total Protein 6.4 - 8.3 g/dL 7.4 7.4 8.1  Total Bilirubin 0.20 - 1.20 mg/dL 0.50 0.78 0.44  Alkaline Phos 40 - 150 U/L 263(H) 199(H) 217(H)  AST 5 - 34 U/L 49(H) 44(H) 43(H)  ALT 0 - 55 U/L 38 37 27   AFP: 09/14/2012: 28.9 10/11/2015: 5.3 11/01/2015: 3.7 12/02/2015: 3.7   RADIOGRAPHIC STUDIES: I have personally reviewed the radiological images as listed and agreed with the findings in the report. No results found.   ASSESSMENT & PLAN: 65 year old Caucasian male with past medical history of successfully treated hepatitis C, liver cirrhosis, history of ascites and encephalopathy, recurrent HCC  1. Recurrent HCC, initially stage II -I reviewed his previous image and outside medical records extensively, confirmed key findings with patient and his family members -He initially had a multifocal disease, status post TACE 3 times in 2015 -he now has developed symptomatic local recurrence, with a 5cm new lesion in segment 8 with direct invasion into portal vein, and a portocaval lymph node. He is not a candidate for liver targeted therapy per his IR Dr. Delice Lesch -he is currently on first line systemic therapy with sorafenib, not able to tolerate full dose, doing well on 300 mg daily, will continue  -the goal of therapy is palliative  -Plan to repeat scan in January 2018   2. Abdominal pain -Secondary to [facility] -much better now with oxycodone and OxyContin, he is on very high dose -continue OxyContin 80 mg every 8 hours, and oxycodone '30mg'$  every 3-4 hours,  I refilled for him yesterday  -I recommend him to see pain management clinic, he declined.  3. 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 Hep C, successfully treated -Continue follow-up with liver clinic NP [facility]  5. Anxiety and insomnia  -He has Xanax for anxiety, does not feel very effective for insomnia -continue Ativan 1 mg at bedtime as needed for insomnia, he knows not to take with Xanax together, he knows not to take during the day   Plan -continue sorafenib to 400 mg in the morning, and 200 mg in the evening  -Continue OxyContin '80mg'$  q8h,  and oxycodone '30mg'$  every 3-4 hrs as needed for breakthrough -I will call his partner [name] and ask her to monitor his medication, and avoid overdose, he is agreeable -F/U in 4 weeks with lab   All questions were answered. The patient knows to call the clinic with any problems, questions or concerns. I spent 25 minutes counseling the patient face to face. The total time spent in the appointment was 30 minutes and more than 50% was on counseling.     Truitt Merle, MD 12/17/2015

## 2015-12-17 NOTE — Telephone Encounter (Signed)
Left message at home # for Kevin Dillon to call us tomorrow to discuss hydralazine dose & clarify if he is taking & how he is taking & to discuss managing pain meds.

## 2015-12-17 NOTE — Telephone Encounter (Signed)
Appointments scheduled per 12/17/15 los. A copy of the AVS  Report and appointment schedule was given to the patient, per 12/17/15 los.

## 2015-12-23 ENCOUNTER — Other Ambulatory Visit: Payer: Self-pay | Admitting: Hematology

## 2015-12-23 ENCOUNTER — Telehealth: Payer: Self-pay | Admitting: *Deleted

## 2015-12-23 ENCOUNTER — Other Ambulatory Visit: Payer: Self-pay | Admitting: *Deleted

## 2015-12-23 DIAGNOSIS — C220 Liver cell carcinoma: Secondary | ICD-10-CM

## 2015-12-23 MED ORDER — ALPRAZOLAM 1 MG PO TABS: 1.0000 mg | ORAL_TABLET | Freq: Two times a day (BID) | ORAL | 0 refills | Status: DC | PRN

## 2015-12-23 MED ORDER — OXYCODONE HCL ER 80 MG PO T12A: 80.0000 mg | EXTENDED_RELEASE_TABLET | Freq: Three times a day (TID) | ORAL | 0 refills | Status: DC

## 2015-12-23 NOTE — Telephone Encounter (Signed)
Received call from [facility] requesting refill for Oxycontin '80mg'$  for pt. Pt's   Phone    [phone].

## 2015-12-23 NOTE — Telephone Encounter (Signed)
I refilled for him  Truitt Merle MD

## 2015-12-24 MED FILL — OxyCONTIN 80 MG T12A: 80 | 30 days supply | Qty: 90 | Fill #0

## 2015-12-24 MED FILL — ALPRAZolam 1 MG TABS: 1 | 30 days supply | Qty: 60 | Fill #0

## 2015-12-31 ENCOUNTER — Telehealth: Payer: Self-pay | Admitting: *Deleted

## 2015-12-31 MED ORDER — LORAZEPAM 1 MG PO TABS: 1.0000 mg | ORAL_TABLET | Freq: Every day | ORAL | 0 refills | Status: DC

## 2015-12-31 MED ORDER — OXYCODONE HCL 30 MG PO TABS: 30.0000 mg | ORAL_TABLET | ORAL | 0 refills | Status: DC | PRN

## 2015-12-31 MED FILL — LORazepam 1 MG TABS: 1 | 30 days supply | Qty: 30 | Fill #0

## 2015-12-31 NOTE — Telephone Encounter (Signed)
Received vm call yesterday from Kevin Dillon regarding refill on lorazepam & oxycodone. Returned call today & will refill ativan at [facility] & pt can p/u script tomorrow for oxycodone.  Informed [name]

## 2016-01-01 MED FILL — oxyCODONE HCL 30 MG TABS: 30 | 15 days supply | Qty: 120 | Fill #0

## 2016-01-06 DIAGNOSIS — K7469 Other cirrhosis of liver: Secondary | ICD-10-CM | POA: Diagnosis not present

## 2016-01-06 DIAGNOSIS — C220 Liver cell carcinoma: Secondary | ICD-10-CM | POA: Diagnosis not present

## 2016-01-06 MED FILL — LACTULOSE 10 GM/15 ML SOLN: 10 | 30 days supply | Qty: 1800 | Fill #0

## 2016-01-10 ENCOUNTER — Other Ambulatory Visit: Payer: Medicare Other

## 2016-01-10 ENCOUNTER — Telehealth: Payer: Self-pay | Admitting: *Deleted

## 2016-01-10 ENCOUNTER — Ambulatory Visit: Payer: Medicare Other | Admitting: Hematology

## 2016-01-10 NOTE — Telephone Encounter (Signed)
Received message from triage that Kevin Dillon called to cancel appt today d/t pt being sick.  Called & spoke to [facility] & she reports pt vomiting from chemo pills.  Unable to talk to pt since he was resting.  Kevin Dillon wants to r/s app with Dr Burr Medico.  Informed per Dr Burr Medico to hold chemo-Nexavar x 2 days & if symptoms resolve may change dose to 1 in am & 1 in pm & return to see Dr Burr Medico 1st of Jan.  Message to Scheduling.

## 2016-01-14 ENCOUNTER — Ambulatory Visit: Payer: Medicare Other | Admitting: Hematology

## 2016-01-14 ENCOUNTER — Other Ambulatory Visit: Payer: Medicare Other

## 2016-01-16 ENCOUNTER — Telehealth: Payer: Self-pay | Admitting: *Deleted

## 2016-01-16 ENCOUNTER — Ambulatory Visit (INDEPENDENT_AMBULATORY_CARE_PROVIDER_SITE_OTHER): Payer: Medicare Other

## 2016-01-16 ENCOUNTER — Ambulatory Visit (INDEPENDENT_AMBULATORY_CARE_PROVIDER_SITE_OTHER): Payer: Medicare Other | Admitting: Physician Assistant

## 2016-01-16 ENCOUNTER — Other Ambulatory Visit: Payer: Self-pay | Admitting: *Deleted

## 2016-01-16 VITALS — BP 100/68 | Temp 98.0°F | Resp 16 | Ht 73.0 in

## 2016-01-16 DIAGNOSIS — M25462 Effusion, left knee: Secondary | ICD-10-CM

## 2016-01-16 DIAGNOSIS — M25562 Pain in left knee: Secondary | ICD-10-CM | POA: Diagnosis not present

## 2016-01-16 DIAGNOSIS — C220 Liver cell carcinoma: Secondary | ICD-10-CM

## 2016-01-16 MED ORDER — OXYCODONE HCL 30 MG PO TABS: 30.0000 mg | ORAL_TABLET | ORAL | 0 refills | Status: DC | PRN

## 2016-01-16 MED FILL — oxyCODONE HCL 30 MG TABS: 30 | 15 days supply | Qty: 120 | Fill #0
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
## 2016-01-16 NOTE — Telephone Encounter (Signed)
Received message that pt requested refill of Oxycodone.  Spoke with caregiver [name], and was informed that pt needed refill of Oxycodone 30 mg.  Jenny Reichmann, NP notified. Pat's   Phone     [phone].

## 2016-01-16 NOTE — Patient Instructions (Addendum)
-  Return to clinic if symptoms worsen, do not improve, or as needed  Knee Arthrocentesis, Care After Introduction Refer to this sheet in the next few weeks. These instructions provide you with information about caring for yourself after your procedure. Your health care provider may also give you more specific instructions. Your treatment has been planned according to current medical practices, but problems sometimes occur. Call your health care provider if you have any problems or questions after your procedure. What can I expect after the procedure? After the procedure, it is common to have:  Bruising.  Swelling.  Soreness. Follow these instructions at home: Bathing  Keep the bandage (dressing) dry until your health care provider says it can be removed. Ask your health care provider when you can start showering or taking a bath. Managing pain, stiffness, and swelling  If directed, apply ice to the puncture area:  Put ice in a plastic bag.  Place a towel between your skin and the bag.  Leave the ice on for 20 minutes, 2-3 times per day.  Do not apply heat to your knee.  Raise the puncture area above the level of your heart while you are sitting or lying down. Activity  Avoid strenuous activities for as long as directed by your health care provider. Ask your health care provider when you can return to your normal activities. General instructions  Take medicines only as directed by your health care provider.  Do not take aspirin or other over-the-counter medicines unless your health care provider says you can.  Check your puncture site every day for signs of infection. Watch for:  Redness, swelling, or pain.  Fluid, blood, or pus.  Follow your health care provider's instructions about dressing changes and removal. Contact a health care provider if:  Your knee pain, swelling, or stiffness returns or gets worse.  You have redness, swelling, or pain in your puncture  area.  You have fluid, blood, or pus coming from your puncture site.  You have warmth in your puncture area.  You have a fever.  Your pain is not controlled with medicine. This information is not intended to replace advice given to you by your health care provider. Make sure you discuss any questions you have with your health care provider. Document Released: 01/26/2014 Document Revised: 06/13/2015 Document Reviewed: 11/15/2013  2017 Elsevier     IF you received an x-ray today, you will receive an invoice from [facility]. Please contact [facility] at [phone] with questions or concerns regarding your invoice.   IF you received labwork today, you will receive an invoice from [facility]. Please contact [facility] at [phone] with questions or concerns regarding your invoice.   Our billing staff will not be able to assist you with questions regarding bills from these companies.  You will be contacted with the lab results as soon as they are available. The fastest way to get your results is to activate your My Chart account. Instructions are located on the last page of this paperwork. If you have not heard from [place] regarding the results in 2 weeks, please contact this office.

## 2016-01-17 LAB — SYNOVIAL FLUID, CELL COUNT
EOS FL: 0 %
LYMPHS FL: 2 %
Lining Cells, Synovial: 0 %
MACROPHAGES FLD: 11 %
NUC CELL # FLD: 10148 {cells}/uL — AB (ref 0–200)
Polys, Fluid: 87 %

## 2016-01-17 NOTE — Progress Notes (Signed)
[place]  [phone] [phone]) [phone]  Clinic Follow up Note   Patient Care Team: Daneil Dolin, MD as Attending Physician (Gastroenterology) Antonietta Jewel, MD as Referring Physician (Internal Medicine) Truitt Merle, MD as Consulting Physician (Hematology) [name], CRNP as Nurse Practitioner (Nurse Practitioner) 01/22/2016   CHIEF COMPLAINTS:  Follow up recurrent [place]   Oncology History   Hepatocellular carcinoma ([place])   Staging form: Liver (Excluding Intrahepatic Bile Ducts), AJCC 7th Edition   - Clinical stage from 04/16/2012: Stage II (T2(m), N0, M0) - Signed by Truitt Merle, MD on 10/12/2015   - Pathologic stage from 04/16/2013: Stage II (T2, N0, cM0) - Signed by Truitt Merle, MD on 10/12/2015      Hepatocellular carcinoma ([place])   09/14/2012 Tumor Marker    AFP 28.9      04/16/2013 Imaging    Abdominal MRI with and without contrast reviewed multifocal (4) liver lesions in both left and right lobes, most consistent with [place], largest measuring 2.7 cm      04/16/2013 Initial Diagnosis    Hepatocellular carcinoma ([place])      04/28/2013 Procedure    Right TACE with lipiodol       06/15/2013 Procedure    Left TACE with [facility]       07/14/2013 Procedure    Right TACE with [facility]      09/05/2015 Imaging    CT abdomen with and without contrast showed a new 5.0 x 2.3 cm mass in the hepatic segment 8, invading and occluding the right anterior portal vein, most compatible with [place]. A portacaval node measuring 3.0 x 1.4 cm, previously 2.9 x 1.1 cm.      10/11/2015 Tumor Marker    AFP 5.3      11/11/2015 -  Chemotherapy    sorafenib '200mg'$  bid started on 11/11/2015, increased to '400mg'$  bid in 2 weeks       11/14/2015 Imaging    CT CAP 11/14/2015 IMPRESSION: Chest Impression: 1. RIGHT upper lobe pulmonary nodule is indeterminate. No comparison available. 2. Ground-glass opacity and peripheral nodules in the RIGHT middle lobe appear post infectious or  inflammatory.  Abdomen / Pelvis Impression: 1. 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 Thrombus also likely within the LEFT and RIGHT portal vein. Difficult to distinguish tumor thrombus versus bland thrombus. 2. Individual lesions are difficult to define in the RIGHT hepatic lobe. There is overall impression of progression of disease in the RIGHT hepatic lobe with multiple ill-defined enhancing lesions. 3. Periportal and shotty retroperitoneal adenopathy similar to prior.       HISTORY OF PRESENTING ILLNESS (10/11/2015):  Kevin Dillon 65 y.o. male with past medical history of treated hepatitis C, liver cirrhosis, and hepatocellular carcinoma is here because of recurrent hepatocellular carcinoma. He is accompanied by his significant other [name] and her sister.  He was diagnosed with hepatocellular carcinoma in 2015, his initial image result is not available and staging is unknown. He was seen by interventional radiologist Dr. Delice Lesch at [facility] in [place] and underwent TACE procedure 3 times in 2015. He was subsequently followed, and recently repeated CT scan showed a new 5.0 cm mass in segment 8, with portal vein invasion. He was felt not to be a candidate for further liver targeted therapy, and was referred to [place] for further systemic therapy.  He complains about mid epigastric pain for the past 2  years, which significantly improved after his TACE procedure in 2015. It has been getting worse lately in the past 6 months. He states he gets about 7-8 out of 10, persistent, he has been taking oxycodone 20 mg every 6 hours, but his pain is not well controlled. He is quite fatigued, is low appetite. He last 40 lbs in the pat 4 months. He is able to function at home, in trying to remain to be physically active.  His hepatitis C was successfully treated. He has history of hepatitis C, complicated  with ascites and encephalopathy, but it has been well controlled with medical management.   CURRENT THERAPY: sorafenib '200mg'$  bid started on 11/11/2015, tried '400mg'$  bid but could not tolerate, changed to '400mg'$  in am and '200mg'$  pm from 12/04/2015  INTERIM HISTORY: [place] returns for follow up. His pain is always there but tolerable. He takes oxycodone about 7 times daily when he has breakthrough / immediate pain. He takes oxycontin 3 times daily. This is managing his pain well. He is requesting three xanax daily so he won't have to take oxycodone as much. He becomes anxious when he is unable to do the things he wants to do - his wife agrees. He is only able to sleep for about 2 to 3 hours after taking lorazepam. He sleeps better after taking xanax. He takes two antiemetic tablets in the morning which help. He is eating well, able to function well at home.    MEDICAL HISTORY:  Past Medical History:  Diagnosis Date  . Cancer ([place])    liver  . Cholelithiasis   . Chronic hepatitis C ([place])   . Chronic knee pain   . Chronic left shoulder pain   . CVA (cerebral infarction)   . Gout   . Hepatitis C    genotype 1b.  pt has been vaccinated against hep A and B.  . Hypertension   . Osteoarthritis   . Schatzki's ring   . Tubular adenoma of colon 12/2011    SURGICAL HISTORY: Past Surgical History:  Procedure Laterality Date  . AMPUTATION Left 09/17/2012   Procedure: SMALL FINGER EXTENSOR TENDON REPAIR; METACARPAL LEVEL AMPUTATION RING FINGER; PROXIMAL PHALANX LEVEL AMPUTATION LONG FINGER;  Surgeon: Schuyler Amor, MD;  Location: [facility];  Service: Orthopedics;  Laterality: Left;  . Arm surgery     right/plate in arm  . COLONOSCOPY WITH ESOPHAGOGASTRODUODENOSCOPY (EGD)  12/30/2011   RMR: Noncritical Schatzki's ring;  Hiatal hernia, Tubular ADENOMA removed from splenic flexure, otherwise normal colonoscopy  . LEG SURGERY     left  . WOUND EXPLORATION Left 09/17/2012   Procedure: WOUND EXPLORATION;   Surgeon: Schuyler Amor, MD;  Location: [facility];  Service: Orthopedics;  Laterality: Left;    SOCIAL HISTORY: Social History   Social History  . Marital status: Single    Spouse name: N/A  . Number of children: 0  . Years of education: N/A   Occupational History  . disabled    Social History Main Topics  . Smoking status: Former Smoker    Packs/day: 0.50    Years: 40.00    Types: Cigarettes    Quit date: 01/19/2012  . Smokeless tobacco: Never Used     Comment: Smokes 1/2 pack of cigarettes daily  . Alcohol use No     Comment: HX 2 beers 3-4 days per week; QUIT DEC 2013  . Drug use: No     Comment: Hx cocaine yrs ago, Last marijuana OCT 2013  .  Sexual activity: Not Currently    Partners: Female   Other Topics Concern  . Not on file   Social History Narrative   Lives w/ significant other, [name]    FAMILY HISTORY: Family History  Problem Relation Age of Onset  . Cirrhosis Father 79  . Cancer Father     liver cancer   . Lung cancer Mother 72  . Colon cancer Neg Hx     ALLERGIES:  has No Known Allergies.  MEDICATIONS:  Current Outpatient Prescriptions  Medication Sig Dispense Refill  . ALPRAZolam (XANAX) 1 MG tablet Take 1 tablet (1 mg total) by mouth 3 (three) times daily as needed for anxiety. 90 tablet 0  . docusate sodium (COLACE) 100 MG capsule Take 1 capsule (100 mg total) by mouth 2 (two) times daily as needed. 60 capsule 5  . hydrALAZINE (APRESOLINE) 25 MG tablet Take 50 mg by mouth daily.     Marland Kitchen lactulose (CHRONULAC) 10 GM/15ML solution     . LORazepam (ATIVAN) 1 MG tablet Take 1 tablet (1 mg total) by mouth at bedtime. 30 tablet 0  . megestrol (MEGACE) 40 MG/ML suspension Take 400 mg by mouth daily.     . Multiple Vitamins-Minerals (CENTRUM SILVER PO) Take 1 tablet by mouth daily.     Marland Kitchen oxyCODONE (OXYCONTIN) 80 mg 12 hr tablet Take 1 tablet (80 mg total) by mouth every 8 (eight) hours. 90 tablet 0  . PARoxetine (PAXIL) 20 MG tablet Take 20 mg  by mouth every morning.     . polyethylene glycol (MIRALAX / GLYCOLAX) packet Take 17 g by mouth daily as needed (constipation). 30 each 11  . prochlorperazine (COMPAZINE) 10 MG tablet Take 1 tablet (10 mg total) by mouth every 8 (eight) hours as needed for nausea or vomiting. 30 tablet 1  . SORAfenib (NEXAVAR) 200 MG tablet Take 2 tablets (400 mg total) by mouth 2 (two) times daily. Give on an empty stomach 1 hour before or 2 hours after meals. (Patient taking differently: Take 400 mg by mouth 2 (two) times daily. Give on an empty stomach 1 hour before or 2 hours after meals. ) 120 tablet 0  . oxycodone (ROXICODONE) 30 MG immediate release tablet Take 1 tablet (30 mg total) by mouth every 3 (three) hours as needed for pain. 120 tablet 0   No current facility-administered medications for this visit.     REVIEW OF SYSTEMS:   Constitutional: Denies fevers, chills or abnormal night sweats Eyes: Denies blurriness of vision, double vision or watery eyes Ears, nose, mouth, throat, and face: Denies mucositis or sore throat Respiratory: Denies cough, dyspnea or wheezes Cardiovascular: Denies palpitation, chest discomfort or lower extremity swelling Gastrointestinal:  Denies nausea, heartburn or change in bowel habits, (+) abdominal pain  Skin: Denies abnormal skin rashes Lymphatics: Denies new lymphadenopathy or easy bruising Neurological:Denies numbness, tingling or new weaknesses Behavioral/Psych: Mood is stable, no new changes (+) anxiety All other systems were reviewed with the patient and are negative.  PHYSICAL EXAMINATION: ECOG PERFORMANCE STATUS: 2  Vitals:   01/22/16 1149  BP: (!) 158/82  Pulse: 93  Resp: 18  Temp: 97.7 F (36.5 C)   Filed Weights   01/22/16 1149  Weight: 155 lb 6.4 oz (70.5 kg)   GENERAL:alert,  SKIN: skin color, texture, turgor are normal, no rashes or significant lesions EYES: normal, conjunctiva are pink and non-injected, sclera clear OROPHARYNX:no  exudate, no erythema and lips, buccal mucosa, and tongue normal  NECK:  supple, thyroid normal size, non-tender, without nodularity LYMPH:  no palpable lymphadenopathy in the cervical, axillary or inguinal LUNGS: clear to auscultation and percussion with normal breathing effort HEART: regular rate & rhythm and no murmurs and no lower extremity edema ABDOMEN:abdomen soft, non-tender and normal bowel sounds, (+) liver is enlarged, palpable 4-5cm below rib cage, mild tenderness, no significant ascites  Musculoskeletal:no cyanosis of digits and no clubbing  PSYCH: alert & oriented x 3 with fluent speech NEURO: no focal motor/sensory deficits  LABORATORY DATA:  I have reviewed the data as listed CBC Latest Ref Rng & Units 01/22/2016 12/17/2015 12/02/2015  WBC 4.0 - 10.3 10e3/uL 8.3 8.8 9.1  Hemoglobin 13.0 - 17.1 g/dL 11.4(L) 10.9(L) 12.2(L)  Hematocrit 38.4 - 49.9 % 34.1(L) 32.7(L) 35.2(L)  Platelets 140 - 400 10e3/uL 264 304 145   CMP Latest Ref Rng & Units 01/22/2016 12/17/2015 12/02/2015  Glucose 70 - 140 mg/dl 110 110 124  BUN 7.0 - 26.0 mg/dL 13.1 11.8 15.7  Creatinine 0.7 - 1.3 mg/dL 1.1 1.0 1.0  Sodium 136 - 145 mEq/L 136 137 137  Potassium 3.5 - 5.1 mEq/L 3.8 4.2 3.6  Chloride 96 - 112 mEq/L - - -  CO2 22 - 29 mEq/L 23 22 20(L)  Calcium 8.4 - 10.4 mg/dL 9.6 9.4 8.8  Total Protein 6.4 - 8.3 g/dL 8.0 7.4 7.4  Total Bilirubin 0.20 - 1.20 mg/dL 0.77 0.50 0.78  Alkaline Phos 40 - 150 U/L 522(H) 263(H) 199(H)  AST 5 - 34 U/L 66(H) 49(H) 44(H)  ALT 0 - 55 U/L 52 38 37   AFP: 09/14/2012: 28.9 10/11/2015: 5.3 11/01/2015: 3.7 12/02/2015: 3.7   RADIOGRAPHIC STUDIES: I have personally reviewed the radiological images as listed and agreed with the findings in the report. Dg Knee Complete 4 Views Left  Result Date: 01/16/2016 CLINICAL DATA:  Left knee pain and swelling for 2 days. EXAM: LEFT KNEE - COMPLETE 4+ VIEW COMPARISON:  None. FINDINGS: No fracture or dislocation is noted. Moderate  suprapatellar joint effusion is noted. Severe narrowing of medial joint space is noted. Chondrocalcinosis is noted in lateral joint space. Osteophyte formation is noted laterally. IMPRESSION: Moderate suprapatellar joint effusion. Severe degenerative joint disease is noted medially. No fracture or dislocation is noted. Electronically Signed   By: Marijo Conception, M.D.   On: 01/16/2016 11:37     ASSESSMENT & PLAN: 65 year old Caucasian male with past medical history of successfully treated hepatitis C, liver cirrhosis, history of ascites and encephalopathy, recurrent HCC  1. Recurrent HCC, initially stage II -I previously reviewed his previous image and outside medical records extensively, confirmed key findings with patient and his family members -He initially had a multifocal disease, status post TACE 3 times in 2015 -he now has developed symptomatic local recurrence, with a 5cm new lesion in segment 8 with direct invasion into portal vein, and a portocaval lymph node. He is not a candidate for liver targeted therapy per his IR Dr. Delice Lesch -he is currently on first line systemic therapy with sorafenib, not able to tolerate full dose, doing well on 600 mg daily, will continue  -the goal of therapy is palliative  -I have ordered repeat CT scans with contrast in 4 weeks to evaluate his response to sorafenib   2. Abdominal pain -Secondary to [facility] -much better now with oxycodone and OxyContin, he is on very high dose, overall pain is controlled  -continue OxyContin 80 mg every 8 hours, and oxycodone '30mg'$  every 3-4 hours. I will  refilled OxyContin 80 mg today. -I recommend him to see pain management clinic, he declined.  3. 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 Hep C, successfully  treated -Continue follow-up with liver clinic NP [facility]  5. Anxiety and insomnia  -He has Xanax for anxiety. I have refilled Xanax 1 mg today. -continue Ativan 1 mg at bedtime as needed for insomnia, he knows not to take with Xanax together, he knows not to take during the day. He does not feel Ativan is working well for sleep, he may take xanax for insomnia   Plan -continue sorafenib to 400 mg in the morning, and 200 mg in the evening  -Continue OxyContin '80mg'$  q8h,  and oxycodone '30mg'$  every 3-4 hrs as needed for breakthrough -I have refilled OxyContin 80 mg and Xanax 1 mg today -I have ordered repeat CT scans with contrast in 4 weeks -F/U in 4 weeks after CT scans with lab.   All questions were answered. The patient knows to call the clinic with any problems, questions or concerns. I spent 25 minutes counseling the patient face to face. The total time spent in the appointment was 30 minutes and more than 50% was on counseling.   This document serves as a record of services personally performed by Truitt Merle, MD. It was created on her behalf by Arlyce Harman, a trained medical scribe. The creation of this record is based on the scribe's personal observations and the provider's statements to them. This document has been checked and approved by the attending provider.   Truitt Merle, MD 01/22/2016

## 2016-01-21 ENCOUNTER — Telehealth: Payer: Self-pay | Admitting: Hematology

## 2016-01-21 NOTE — Telephone Encounter (Signed)
Left message re 1/3 lab/fu.

## 2016-01-22 ENCOUNTER — Ambulatory Visit (HOSPITAL_BASED_OUTPATIENT_CLINIC_OR_DEPARTMENT_OTHER): Payer: Medicare Other | Admitting: Hematology

## 2016-01-22 ENCOUNTER — Telehealth: Payer: Self-pay | Admitting: Hematology

## 2016-01-22 ENCOUNTER — Other Ambulatory Visit (HOSPITAL_BASED_OUTPATIENT_CLINIC_OR_DEPARTMENT_OTHER): Payer: Medicare Other

## 2016-01-22 ENCOUNTER — Encounter: Payer: Self-pay | Admitting: Hematology

## 2016-01-22 VITALS — BP 158/82 | HR 93 | Temp 97.7°F | Resp 18 | Ht 73.0 in | Wt 155.4 lb

## 2016-01-22 DIAGNOSIS — G893 Neoplasm related pain (acute) (chronic): Secondary | ICD-10-CM | POA: Diagnosis not present

## 2016-01-22 DIAGNOSIS — C220 Liver cell carcinoma: Secondary | ICD-10-CM

## 2016-01-22 DIAGNOSIS — Z8619 Personal history of other infectious and parasitic diseases: Secondary | ICD-10-CM

## 2016-01-22 DIAGNOSIS — K7469 Other cirrhosis of liver: Secondary | ICD-10-CM

## 2016-01-22 LAB — CBC WITH DIFFERENTIAL/PLATELET
BASO%: 0.2 % (ref 0.0–2.0)
Basophils Absolute: 0 10*3/uL (ref 0.0–0.1)
EOS%: 1.2 % (ref 0.0–7.0)
Eosinophils Absolute: 0.1 10*3/uL (ref 0.0–0.5)
HCT: 34.1 % — ABNORMAL LOW (ref 38.4–49.9)
HEMOGLOBIN: 11.4 g/dL — AB (ref 13.0–17.1)
LYMPH#: 2 10*3/uL (ref 0.9–3.3)
LYMPH%: 23.5 % (ref 14.0–49.0)
MCH: 28.3 pg (ref 27.2–33.4)
MCHC: 33.4 g/dL (ref 32.0–36.0)
MCV: 84.6 fL (ref 79.3–98.0)
MONO#: 0.6 10*3/uL (ref 0.1–0.9)
MONO%: 7.7 % (ref 0.0–14.0)
NEUT%: 67.4 % (ref 39.0–75.0)
NEUTROS ABS: 5.6 10*3/uL (ref 1.5–6.5)
PLATELETS: 264 10*3/uL (ref 140–400)
RBC: 4.03 10*6/uL — ABNORMAL LOW (ref 4.20–5.82)
RDW: 13.9 % (ref 11.0–14.6)
WBC: 8.3 10*3/uL (ref 4.0–10.3)

## 2016-01-22 LAB — COMPREHENSIVE METABOLIC PANEL
ALT: 52 U/L (ref 0–55)
ANION GAP: 11 meq/L (ref 3–11)
AST: 66 U/L — ABNORMAL HIGH (ref 5–34)
Albumin: 3 g/dL — ABNORMAL LOW (ref 3.5–5.0)
Alkaline Phosphatase: 522 U/L — ABNORMAL HIGH (ref 40–150)
BILIRUBIN TOTAL: 0.77 mg/dL (ref 0.20–1.20)
BUN: 13.1 mg/dL (ref 7.0–26.0)
CALCIUM: 9.6 mg/dL (ref 8.4–10.4)
CO2: 23 mEq/L (ref 22–29)
CREATININE: 1.1 mg/dL (ref 0.7–1.3)
Chloride: 101 mEq/L (ref 98–109)
EGFR: 73 mL/min/{1.73_m2} — ABNORMAL LOW (ref >90)
Glucose: 110 mg/dl (ref 70–140)
Potassium: 3.8 mEq/L (ref 3.5–5.1)
Sodium: 136 mEq/L (ref 136–145)
TOTAL PROTEIN: 8 g/dL (ref 6.4–8.3)

## 2016-01-22 MED ORDER — OXYCODONE HCL ER 80 MG PO T12A: 80.0000 mg | EXTENDED_RELEASE_TABLET | Freq: Three times a day (TID) | ORAL | 0 refills | Status: DC

## 2016-01-22 MED ORDER — ALPRAZOLAM 1 MG PO TABS: 1.0000 mg | ORAL_TABLET | Freq: Three times a day (TID) | ORAL | 0 refills | Status: DC | PRN

## 2016-01-22 MED FILL — ALPRAZolam 1 MG TABS: 1 | 30 days supply | Qty: 90 | Fill #0

## 2016-01-22 MED FILL — OxyCONTIN 80 MG T12A: 80 | 30 days supply | Qty: 90 | Fill #0

## 2016-01-22 NOTE — Telephone Encounter (Signed)
2 bottles of contrast given per CT orders. Appointments scheduled per 01/22/16 los.  Patient was given a copy of the AVS report and appointment schedule, per 01/22/16 los.

## 2016-01-23 LAB — AFP TUMOR MARKER: AFP, Serum, Tumor Marker: 3.4 ng/mL (ref 0.0–8.3)

## 2016-01-24 ENCOUNTER — Other Ambulatory Visit: Payer: Self-pay | Admitting: Physician Assistant

## 2016-01-24 MED ORDER — MELOXICAM 15 MG PO TABS: 15.0000 mg | ORAL_TABLET | Freq: Every day | ORAL | 0 refills | Status: AC

## 2016-01-24 MED FILL — MELOXICAM 15 MG TABLET: 15 | 30 days supply | Qty: 30 | Fill #0

## 2016-01-25 ENCOUNTER — Emergency Department (HOSPITAL_COMMUNITY)
Admission: EM | Admit: 2016-01-25 | Discharge: 2016-01-25 | Disposition: A | Discharge status: Home / Self Care | Payer: Medicare Other | Attending: Emergency Medicine | Admitting: Emergency Medicine

## 2016-01-25 ENCOUNTER — Emergency Department (HOSPITAL_COMMUNITY): Payer: Medicare Other

## 2016-01-25 ENCOUNTER — Encounter (HOSPITAL_COMMUNITY): Payer: Self-pay | Admitting: Emergency Medicine

## 2016-01-25 DIAGNOSIS — Z79899 Other long term (current) drug therapy: Secondary | ICD-10-CM | POA: Insufficient documentation

## 2016-01-25 DIAGNOSIS — Z8673 Personal history of transient ischemic attack (TIA), and cerebral infarction without residual deficits: Secondary | ICD-10-CM | POA: Diagnosis not present

## 2016-01-25 DIAGNOSIS — I10 Essential (primary) hypertension: Secondary | ICD-10-CM | POA: Diagnosis not present

## 2016-01-25 DIAGNOSIS — C220 Liver cell carcinoma: Secondary | ICD-10-CM | POA: Insufficient documentation

## 2016-01-25 DIAGNOSIS — D015 Carcinoma in situ of liver, gallbladder and bile ducts: Secondary | ICD-10-CM | POA: Diagnosis not present

## 2016-01-25 DIAGNOSIS — Z87891 Personal history of nicotine dependence: Secondary | ICD-10-CM | POA: Diagnosis not present

## 2016-01-25 DIAGNOSIS — R1011 Right upper quadrant pain: Secondary | ICD-10-CM | POA: Diagnosis not present

## 2016-01-25 DIAGNOSIS — G893 Neoplasm related pain (acute) (chronic): Secondary | ICD-10-CM | POA: Diagnosis not present

## 2016-01-25 LAB — COMPREHENSIVE METABOLIC PANEL
ALBUMIN: 3.3 g/dL — AB (ref 3.5–5.0)
ALK PHOS: 433 U/L — AB (ref 38–126)
ALT: 43 U/L (ref 17–63)
ANION GAP: 9 (ref 5–15)
AST: 58 U/L — ABNORMAL HIGH (ref 15–41)
BUN: 13 mg/dL (ref 6–20)
CHLORIDE: 101 mmol/L (ref 101–111)
CO2: 25 mmol/L (ref 22–32)
Calcium: 9 mg/dL (ref 8.9–10.3)
Creatinine, Ser: 1.08 mg/dL (ref 0.61–1.24)
GFR calc Af Amer: >60 mL/min (ref >60)
Glucose, Bld: 88 mg/dL (ref 65–99)
POTASSIUM: 4.2 mmol/L (ref 3.5–5.1)
SODIUM: 135 mmol/L (ref 135–145)
TOTAL PROTEIN: 8.5 g/dL — AB (ref 6.5–8.1)
Total Bilirubin: 0.7 mg/dL (ref 0.3–1.2)

## 2016-01-25 LAB — LIPASE, BLOOD: Lipase: 16 U/L (ref 11–51)

## 2016-01-25 LAB — CBC
HEMATOCRIT: 37.7 % — AB (ref 39.0–52.0)
HEMOGLOBIN: 12.5 g/dL — AB (ref 13.0–17.0)
MCH: 28.2 pg (ref 26.0–34.0)
MCHC: 33.2 g/dL (ref 30.0–36.0)
MCV: 85.1 fL (ref 78.0–100.0)
Platelets: 280 10*3/uL (ref 150–400)
RBC: 4.43 MIL/uL (ref 4.22–5.81)
RDW: 13.8 % (ref 11.5–15.5)
WBC: 11.3 10*3/uL — AB (ref 4.0–10.5)

## 2016-01-25 LAB — URINALYSIS, ROUTINE W REFLEX MICROSCOPIC
Bilirubin Urine: NEGATIVE
GLUCOSE, UA: NEGATIVE mg/dL
Hgb urine dipstick: NEGATIVE
KETONES UR: NEGATIVE mg/dL
LEUKOCYTES UA: NEGATIVE
NITRITE: NEGATIVE
PH: 5.5 (ref 5.0–8.0)
Protein, ur: NEGATIVE mg/dL
SPECIFIC GRAVITY, URINE: 1.025 (ref 1.005–1.030)

## 2016-01-25 MED ORDER — HYDROMORPHONE HCL 1 MG/ML IJ SOLN
1.0000 mg | Freq: Once | INTRAMUSCULAR | Status: AC
  Administered 2016-01-25: 1 mg via INTRAVENOUS
  Filled 2016-01-25: qty 1

## 2016-01-25 MED ORDER — DOXYCYCLINE HYCLATE 100 MG PO CAPS: 100.0000 mg | ORAL_CAPSULE | Freq: Two times a day (BID) | ORAL | 0 refills | Status: AC

## 2016-01-25 MED ORDER — AMOXICILLIN-POT CLAVULANATE 875-125 MG PO TABS: 1.0000 | ORAL_TABLET | Freq: Two times a day (BID) | ORAL | 0 refills | Status: AC

## 2016-01-25 MED ORDER — ONDANSETRON HCL 4 MG/2ML IJ SOLN
4.0000 mg | Freq: Once | INTRAMUSCULAR | Status: AC
  Administered 2016-01-25: 4 mg via INTRAVENOUS
  Filled 2016-01-25: qty 2

## 2016-01-25 MED ORDER — IOPAMIDOL (ISOVUE-300) INJECTION 61%
INTRAVENOUS | Status: AC
  Administered 2016-01-25: 100 mL
  Filled 2016-01-25: qty 100

## 2016-01-25 MED ORDER — OXYCODONE HCL 10 MG PO TABS: ORAL_TABLET | ORAL | 0 refills | Status: DC

## 2016-01-25 NOTE — ED Notes (Signed)
Per Dr. William Hamburger, patient can have water to drink. Water was given to the patient. Nurse aware.

## 2016-01-25 NOTE — ED Triage Notes (Signed)
Pt complaint of RUQ pain onset Thursday. Pt denies n/v/d. Pt takes oral chemotherapy for liver cancer.

## 2016-01-25 NOTE — ED Notes (Signed)
Off floor for testing 

## 2016-01-25 NOTE — ED Provider Notes (Signed)
[name]   CSN: [id number] Arrival date & time: 01/25/16  3662     History   Chief Complaint Chief Complaint  Patient presents with  . Abdominal Pain    HPI Kevin Dillon is a 67 y.o. male.  HPI 66 year old male with history of metastatic hepatocellular carcinoma with known portal vein thrombosis here with acute on chronic right upper quadrant pain. Patient is currently on OxyContin and oxycodone for his pain. He states over the last 2 days, he had gradual onset of progressively worsening aching, throbbing, and sharp right upper quadrant pain. The pain is worse with palpation of his abdomen. It feels exactly similar to the location of his chronic pain, only worse. He denies any associated fevers or chills. No nausea or vomiting. He is having normal bowel movements. Denies any cough or shortness of breath.  Past Medical History:  Diagnosis Date  . Cancer ([place])    liver  . Cholelithiasis   . Chronic hepatitis C ([place])   . Chronic knee pain   . Chronic left shoulder pain   . CVA (cerebral infarction)   . Gout   . Hepatitis C    genotype 1b.  pt has been vaccinated against hep A and B.  . Hypertension   . Osteoarthritis   . Schatzki's ring   . Tubular adenoma of colon 12/2011    Patient Active Problem List   Diagnosis Date Noted  . Cancer related pain 10/25/2015  . Liver cirrhosis ([place]) 10/25/2015  . Hepatocellular carcinoma ([place]) 04/18/2013  . Unspecified constipation 03/01/2012  . Tubular adenoma of colon 03/01/2012  . Hx of hepatitis C 12/16/2011  . Elevated LFTs 12/16/2011    Past Surgical History:  Procedure Laterality Date  . AMPUTATION Left 09/17/2012   Procedure: SMALL FINGER EXTENSOR TENDON REPAIR; METACARPAL LEVEL AMPUTATION RING FINGER; PROXIMAL PHALANX LEVEL AMPUTATION LONG FINGER;  Surgeon: Schuyler Amor, MD;  Location: [facility];  Service: Orthopedics;  Laterality: Left;  . Arm surgery     right/plate in arm  . COLONOSCOPY WITH  ESOPHAGOGASTRODUODENOSCOPY (EGD)  12/30/2011   RMR: Noncritical Schatzki's ring;  Hiatal hernia, Tubular ADENOMA removed from splenic flexure, otherwise normal colonoscopy  . LEG SURGERY     left  . WOUND EXPLORATION Left 09/17/2012   Procedure: WOUND EXPLORATION;  Surgeon: Schuyler Amor, MD;  Location: [facility];  Service: Orthopedics;  Laterality: Left;       Home Medications    Prior to Admission medications   Medication Sig Start Date End Date Taking? Authorizing Provider  ALPRAZolam Duanne Moron) 1 MG tablet Take 1 tablet (1 mg total) by mouth 3 (three) times daily as needed for anxiety. 01/22/16  Yes Truitt Merle, MD  docusate sodium (COLACE) 100 MG capsule Take 1 capsule (100 mg total) by mouth 2 (two) times daily as needed.   Yes Annitta Needs, NP  hydrALAZINE (APRESOLINE) 25 MG tablet Take 50 mg by mouth daily.  08/21/15  Yes Historical Provider, MD  lactulose (CHRONULAC) 10 GM/15ML solution 30 g daily as needed for moderate constipation.  01/06/16  Yes Historical Provider, MD  megestrol (MEGACE) 40 MG/ML suspension Take 400 mg by mouth daily.  10/07/15  Yes Historical Provider, MD  Multiple Vitamins-Minerals (CENTRUM SILVER PO) Take 1 tablet by mouth daily.    Yes Historical Provider, MD  oxyCODONE (OXYCONTIN) 80 mg 12 hr tablet Take 1 tablet (80 mg total) by mouth every 8 (eight) hours. 01/22/16  Yes Truitt Merle, MD  oxycodone (ROXICODONE) 30 MG immediate release tablet Take 1 tablet (30 mg total) by mouth every 3 (three) hours as needed for pain. 01/16/16  Yes Susanne Borders, NP  PARoxetine (PAXIL) 20 MG tablet Take 20 mg by mouth every morning.  08/08/12  Yes Historical Provider, MD  prochlorperazine (COMPAZINE) 10 MG tablet Take 1 tablet (10 mg total) by mouth every 8 (eight) hours as needed for nausea or vomiting. 12/16/15  Yes Truitt Merle, MD  SORAfenib (NEXAVAR) 200 MG tablet Take 2 tablets (400 mg total) by mouth 2 (two) times daily. Give on an empty stomach 1 hour before or 2 hours after  meals. Patient taking differently: Take 200-400 mg by mouth 2 (two) times daily. Take '400mg'$  once in the morning and then take '200mg'$  once in the evening. Give on an empty stomach 1 hour before or 2 hours after meals. 11/01/15  Yes Truitt Merle, MD  amoxicillin-clavulanate (AUGMENTIN) 875-125 MG tablet Take 1 tablet by mouth 2 (two) times daily. 01/25/16 02/01/16  Duffy Bruce, MD  doxycycline (VIBRAMYCIN) 100 MG capsule Take 1 capsule (100 mg total) by mouth 2 (two) times daily. 01/25/16 02/01/16  Duffy Bruce, MD  LORazepam (ATIVAN) 1 MG tablet Take 1 tablet (1 mg total) by mouth at bedtime. Patient not taking: Reported on 01/25/2016 12/31/15   Truitt Merle, MD  meloxicam (MOBIC) 15 MG tablet Take 1 tablet (15 mg total) by mouth daily. Patient not taking: Reported on 01/25/2016 01/24/16 02/03/16  Leonie Douglas, PA-C  Oxycodone HCl 10 MG TABS Take one to two tablets in addition to your usual dose, every 4 hours as needed for severe pain. 01/25/16   Duffy Bruce, MD  polyethylene glycol [facility] / Floria Raveling) packet Take 17 g by mouth daily as needed (constipation). Patient not taking: Reported on 01/25/2016 07/24/13   Mahala Menghini, PA-C    Family History Family History  Problem Relation Age of Onset  . Cirrhosis Father 36  . Cancer Father     liver cancer   . Lung cancer Mother 92  . Colon cancer Neg Hx     Social History Social History  Substance Use Topics  . Smoking status: Former Smoker    Packs/day: 0.50    Years: 40.00    Types: Cigarettes    Quit date: 01/19/2012  . Smokeless tobacco: Never Used     Comment: Smokes 1/2 pack of cigarettes daily  . Alcohol use No     Comment: HX 2 beers 3-4 days per week; QUIT DEC 2013     Allergies   Patient has no known allergies.   Review of Systems Review of Systems  Constitutional: Positive for fatigue. Negative for chills and fever.  HENT: Negative for congestion and rhinorrhea.   Eyes: Negative for visual disturbance.  Respiratory: Negative  for cough, shortness of breath and wheezing.   Cardiovascular: Negative for chest pain and leg swelling.  Gastrointestinal: Positive for abdominal pain. Negative for abdominal distention, diarrhea, nausea and vomiting.  Genitourinary: Negative for dysuria and flank pain.  Musculoskeletal: Negative for neck pain and neck stiffness.  Skin: Negative for rash and wound.  Allergic/Immunologic: Negative for immunocompromised state.  Neurological: Negative for syncope, weakness and headaches.  All other systems reviewed and are negative.    Physical Exam Updated Vital Signs BP 138/81   Pulse 80   Temp 98.2 F (36.8 C) (Oral)   Resp 18   Ht '6\' 1"'$  (1.854 m)   Wt 158 lb (71.7 kg)  SpO2 96%   BMI 20.85 kg/m   Physical Exam  Constitutional: He is oriented to person, place, and time. He appears well-developed and well-nourished. No distress.  HENT:  Head: Normocephalic and atraumatic.  Eyes: Conjunctivae are normal.  Neck: Neck supple.  Cardiovascular: Normal rate, regular rhythm and normal heart sounds.  Exam reveals no friction rub.   No murmur heard. Pulmonary/Chest: Effort normal. No tachypnea. No respiratory distress. He has no decreased breath sounds. He has no wheezes. He has no rales.  Abdominal: Soft. Normal appearance and bowel sounds are normal. He exhibits no distension. There is tenderness in the right upper quadrant. There is guarding. There is no rigidity, no rebound, no CVA tenderness and negative Murphy's sign.  Musculoskeletal: He exhibits no edema.  Neurological: He is alert and oriented to person, place, and time. He exhibits normal muscle tone.  Skin: Skin is warm. Capillary refill takes less than 2 seconds.  Psychiatric: He has a normal mood and affect.  Nursing note and vitals reviewed.    ED Treatments / Results  Labs (all labs ordered are listed, but only abnormal results are displayed) Labs Reviewed  COMPREHENSIVE METABOLIC PANEL - Abnormal; Notable for  the following:       Result Value   Total Protein 8.5 (*)    Albumin 3.3 (*)    AST 58 (*)    Alkaline Phosphatase 433 (*)    All other components within normal limits  CBC - Abnormal; Notable for the following:    WBC 11.3 (*)    Hemoglobin 12.5 (*)    HCT 37.7 (*)    All other components within normal limits  LIPASE, BLOOD  URINALYSIS, ROUTINE W REFLEX MICROSCOPIC    EKG  EKG Interpretation None       Radiology Ct Abdomen Pelvis W Contrast  Result Date: 01/25/2016 CLINICAL DATA:  Sharp RUQ pain, abd pain, known liver CAOngoing Chemo EXAM: CT ABDOMEN AND PELVIS WITH CONTRAST TECHNIQUE: Multidetector CT imaging of the abdomen and pelvis was performed using the standard protocol following bolus administration of intravenous contrast. CONTRAST:  145m ISOVUE-300 IOPAMIDOL (ISOVUE-300) INJECTION 61% COMPARISON:  11/14/2015 FINDINGS: Lower chest: There is interstitial thickening with associated irregular focal peribronchovascular nodular type opacities, most evident in the right lung base including the right middle and lower lobes, with a small area noted in the medial left lower lobe. This is new since the prior exam. More focal opacity is noted the anterior base of the right middle lobe and along the medial margin of the right middle lobe, which may reflect atelectasis. No pleural effusion. Heart normal size. Hepatobiliary: Heterogeneous enhancement and attenuation of the liver. There are focal areas of intrahepatic bile duct dilation in the right lobe. Ill-defined enhancing masses are evident. Thrombus fills the right, left and main portal veins. Several venous collaterals extend through the porta hepatis. Renal veins appear patent. The gallbladder is mildly distended. No convincing stones. No bile duct dilation. Pancreas: Unremarkable. No pancreatic ductal dilatation or surrounding inflammatory changes. Spleen: Mildly enlarged measuring 14.8 cm from superior inferior, previously 13.5 cm. No  discrete spleen mass or focal lesion. Adrenals/Urinary Tract: Adrenal glands are unremarkable. Kidneys are normal, without renal calculi, focal lesion, or hydronephrosis. Bladder is unremarkable. Stomach/Bowel: No bowel dilation. No bowel wall thickening or inflammation. Appendix not visualized. Vascular/Lymphatic: Prominent and mildly enlarged gastrohepatic ligament lymph nodes, largest measuring 13 mm in short axis, similar to the prior CT. No other adenopathy. There is aortoiliac atherosclerotic disease with  calcifications. Reproductive: Unremarkable Other: No abdominal wall hernia.  No ascites. Musculoskeletal: No osteoblastic or osteolytic lesions. No acute findings. IMPRESSION: 1. Findings consistent with multifocal liver carcinoma with extensive portal vein thrombosis, and subsequent development of porta hepatis venous collaterals. Thrombus in the central portal vein is no longer expansile, but still expands the more peripheral portal vein and the right and left portal veins. There are few right liver bile ducts there are now dilated, which suggests mild progression of liver disease. 2. There is mild splenomegaly consistent or venous hypertension, which has mildly increased from the prior CT. 3. There are new lung abnormalities with interstitial thickening and ill-defined peribronchovascular nodular opacities mostly at the right lung base with a smaller area noted along the medial left lower lobe. The etiology may be infectious. It may be inflammatory, possibly related to chemotherapy. Neoplastic disease is also possible. 4. There is no other evidence of metastatic disease within the abdomen or pelvis. No acute findings within the abdomen or pelvis. Electronically Signed   By: Lajean Manes M.D.   On: 01/25/2016 12:39    Procedures Procedures (including critical care time)  Medications Ordered in ED Medications  HYDROmorphone (DILAUDID) injection 1 mg (1 mg Intravenous Given 01/25/16 1104)  ondansetron  (ZOFRAN) injection 4 mg (4 mg Intravenous Given 01/25/16 1104)  iopamidol (ISOVUE-300) 61 % injection (100 mLs  Contrast Given 01/25/16 1202)  HYDROmorphone (DILAUDID) injection 1 mg (1 mg Intravenous Given 01/25/16 1352)     Initial Impression / Assessment and Plan / ED Course  I have reviewed the triage vital signs and the nursing notes.  Pertinent labs & imaging results that were available during my care of the patient were reviewed by me and considered in my medical decision making (see chart for details).  Clinical Course     66 year old male with past medical history of known hepatocellular carcinoma and portal vein thrombosis here with acute on chronic right upper quadrant pain. On arrival, vital signs are stable. His abdomen is tender but not distended without rigidity or rebound. Labwork is overall at baseline. He has a mild leukocytosis but LFTs and alkaline phosphatase are near normal. Normal renal function. CT subsequently obtained and shows progression of liver disease without acute hemorrhage, obstruction, or other acute emergency. I discussed these findings with oncology on call, Dr. Lenny Pastel. He will notify the patient's primary oncologist. He recommends pain control.   I discussed these findings with the patient. He would like to return home, which I feel is reasonable. He is currently in the process of setting up hospice. Will give him a brief course of additional analgesia per discussion with oncology, and have him call his oncologist on Monday for further medication changes. Return precautions given. Of note, incidentally noted interstitial thickening noted in the right lung base. This may be secondary to his localized treatment and chemotherapy, but given his active chemotherapy use, will cover him with Augmentin and azithromycin. He has not recently been on antibiotics. He is not neutropenic. He would not like to stay for IV antibiotics. Again, return precautions given.  Final  Clinical Impressions(s) / ED Diagnoses   Final diagnoses:  Cancer associated pain  Hepatocellular carcinoma ([place])    New Prescriptions Discharge Medication List as of 01/25/2016  1:30 PM    START taking these medications   Details  amoxicillin-clavulanate (AUGMENTIN) 875-125 MG tablet Take 1 tablet by mouth 2 (two) times daily., Starting Sat 01/25/2016, Until Sat 02/01/2016, Print    doxycycline (VIBRAMYCIN)  100 MG capsule Take 1 capsule (100 mg total) by mouth 2 (two) times daily., Starting Sat 01/25/2016, Until Sat 02/01/2016, Print    !! Oxycodone HCl 10 MG TABS Take one to two tablets in addition to your usual dose, every 4 hours as needed for severe pain., Print     !! - Potential duplicate medications found. Please discuss with provider.       Duffy Bruce, MD 01/25/16 [id number]

## 2016-01-25 NOTE — ED Notes (Signed)
Kevin Dillon aware of pt status and complaint. Verbal to place abdominal protocol ONLY no additional orders needed at present time.

## 2016-01-27 ENCOUNTER — Ambulatory Visit: Payer: Medicare Other | Admitting: Hematology

## 2016-01-27 ENCOUNTER — Telehealth: Payer: Self-pay | Admitting: Hematology

## 2016-01-27 DIAGNOSIS — I81 Portal vein thrombosis: Secondary | ICD-10-CM | POA: Insufficient documentation

## 2016-01-27 DIAGNOSIS — C220 Liver cell carcinoma: Secondary | ICD-10-CM

## 2016-01-27 NOTE — Telephone Encounter (Signed)
Patient wife called said that patient had to go to the ER this weekend and needs Follow Up with Dr Burr Medico.  They Did a CT scan and also said something about calling in Hospice

## 2016-01-27 NOTE — Telephone Encounter (Signed)
callrd pt to inform og 1/8 appt at 130 pm per LOS. Pt wife stated they could not come today due to weather and being last minute. Pt requested office visit for 1/9 at 830 am. Pt has new appt date/time

## 2016-01-27 NOTE — Telephone Encounter (Signed)
I'll see him at 1:30 PM today. I have sent a scheduling message.  Truitt Merle MD

## 2016-01-27 NOTE — Progress Notes (Signed)
[place]  [phone] [phone]  Clinic Follow up Note   Patient Care Team: Truitt Merle, MD as PCP - General (Hematology) Daneil Dolin, MD as Attending Physician (Gastroenterology) Antonietta Jewel, MD as Referring Physician (Internal Medicine) Truitt Merle, MD as Consulting Physician (Hematology) [name], CRNP as Nurse Practitioner (Nurse Practitioner) 01/28/2016   CHIEF COMPLAINTS:  Follow up recurrent [place]   Oncology History   Hepatocellular carcinoma ([place])   Staging form: Liver (Excluding Intrahepatic Bile Ducts), AJCC 7th Edition   - Clinical stage from 04/16/2012: Stage II (T2(m), N0, M0) - Signed by Truitt Merle, MD on 10/12/2015   - Pathologic stage from 04/16/2013: Stage II (T2, N0, cM0) - Signed by Truitt Merle, MD on 10/12/2015      Hepatocellular carcinoma ([place])   09/14/2012 Tumor Marker    AFP 28.9      04/16/2013 Imaging    Abdominal MRI with and without contrast reviewed multifocal (4) liver lesions in both left and right lobes, most consistent with [place], largest measuring 2.7 cm      04/16/2013 Initial Diagnosis    Hepatocellular carcinoma ([place])      04/28/2013 Procedure    Right TACE with lipiodol       06/15/2013 Procedure    Left TACE with [facility]       07/14/2013 Procedure    Right TACE with [facility]      09/05/2015 Imaging    CT abdomen with and without contrast showed a new 5.0 x 2.3 cm mass in the hepatic segment 8, invading and occluding the right anterior portal vein, most compatible with [place]. A portacaval node measuring 3.0 x 1.4 cm, previously 2.9 x 1.1 cm.      10/11/2015 Tumor Marker    AFP 5.3      11/11/2015 -  Chemotherapy    sorafenib '200mg'$  bid started on 11/11/2015, increased to '400mg'$  bid in 2 weeks       11/14/2015 Imaging    CT CAP 11/14/2015 IMPRESSION: Chest Impression: 1. RIGHT upper lobe pulmonary nodule is indeterminate. No comparison available. 2. Ground-glass opacity and peripheral nodules in the RIGHT  middle lobe appear post infectious or inflammatory.  Abdomen / Pelvis Impression: 1. 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 Thrombus also likely within the LEFT and RIGHT portal vein. Difficult to distinguish tumor thrombus versus bland thrombus. 2. Individual lesions are difficult to define in the RIGHT hepatic lobe. There is overall impression of progression of disease in the RIGHT hepatic lobe with multiple ill-defined enhancing lesions. 3. Periportal and shotty retroperitoneal adenopathy similar to prior.       HISTORY OF PRESENTING ILLNESS (10/11/2015):  Kevin Dillon 66 y.o. male with past medical history of treated hepatitis C, liver cirrhosis, and hepatocellular carcinoma is here because of recurrent hepatocellular carcinoma. He is accompanied by his significant other Kevin Dillon and her sister.  He was diagnosed with hepatocellular carcinoma in 2015, his initial image result is not available and staging is unknown. He was seen by interventional radiologist Dr. Delice Lesch at [facility] in [place] and underwent TACE procedure 3 times in 2015. He was subsequently followed, and recently repeated CT scan showed a new 5.0 cm mass in segment 8, with portal vein invasion. He was felt not to be a candidate for further liver targeted therapy, and was referred to [place] for further systemic therapy.  He complains  about mid epigastric pain for the past 2 years, which significantly improved after his TACE procedure in 2015. It has been getting worse lately in the past 6 months. He states he gets about 7-8 out of 10, persistent, he has been taking oxycodone 20 mg every 6 hours, but his pain is not well controlled. He is quite fatigued, is low appetite. He last 40 lbs in the pat 4 months. He is able to function at home, in trying to remain to be physically active.  His hepatitis C was successfully treated. He  has history of hepatitis C, complicated with ascites and encephalopathy, but it has been well controlled with medical management.   CURRENT THERAPY: sorafenib '200mg'$  bid started on 11/11/2015, tried '400mg'$  bid but could not tolerate, changed to '400mg'$  in am and '200mg'$  pm from 12/04/2015, plan to change to Nivolumab next week   INTERIM HISTORY: Kevin Dillon returns for follow up. He developed worsening pain in the right upper quadrant of his abdomen 3-4 days ago, and he was seen in the ED 2 days ago. CT abdomen and pelvis was performed, which showed extensive portals thrombosis, and possible disease progression of his liver cancer. CT scan also reviewed interstitial thickening in the right lung base, so he was treated with Augmentin and doxycycline for presumed pneumonia. He does not have fever or cough. His pain has improved since his ED visit, his tolerable now. No other new complaints.  MEDICAL HISTORY:  Past Medical History:  Diagnosis Date  . Cancer ([place])    liver  . Cholelithiasis   . Chronic hepatitis C ([place])   . Chronic knee pain   . Chronic left shoulder pain   . CVA (cerebral infarction)   . Gout   . Hepatitis C    genotype 1b.  pt has been vaccinated against hep A and B.  . Hypertension   . Osteoarthritis   . Schatzki's ring   . Tubular adenoma of colon 12/2011    SURGICAL HISTORY: Past Surgical History:  Procedure Laterality Date  . AMPUTATION Left 09/17/2012   Procedure: SMALL FINGER EXTENSOR TENDON REPAIR; METACARPAL LEVEL AMPUTATION RING FINGER; PROXIMAL PHALANX LEVEL AMPUTATION LONG FINGER;  Surgeon: Schuyler Amor, MD;  Location: [facility];  Service: Orthopedics;  Laterality: Left;  . Arm surgery     right/plate in arm  . COLONOSCOPY WITH ESOPHAGOGASTRODUODENOSCOPY (EGD)  12/30/2011   RMR: Noncritical Schatzki's ring;  Hiatal hernia, Tubular ADENOMA removed from splenic flexure, otherwise normal colonoscopy  . LEG SURGERY     left  . WOUND EXPLORATION Left 09/17/2012    Procedure: WOUND EXPLORATION;  Surgeon: Schuyler Amor, MD;  Location: [facility];  Service: Orthopedics;  Laterality: Left;    SOCIAL HISTORY: Social History   Social History  . Marital status: Single    Spouse name: N/A  . Number of children: 0  . Years of education: N/A   Occupational History  . disabled    Social History Main Topics  . Smoking status: Former Smoker    Packs/day: 0.50    Years: 40.00    Types: Cigarettes    Quit date: 01/19/2012  . Smokeless tobacco: Never Used     Comment: Smokes 1/2 pack of cigarettes daily  . Alcohol use No     Comment: HX 2 beers 3-4 days per week; QUIT DEC 2013  . Drug use: No     Comment: Hx cocaine yrs ago, Last marijuana OCT 2013  . Sexual activity: Not Currently  Partners: Female   Other Topics Concern  . Not on file   Social History Narrative   Lives w/ significant other, Kevin Dillon    FAMILY HISTORY: Family History  Problem Relation Age of Onset  . Cirrhosis Father 49  . Cancer Father     liver cancer   . Lung cancer Mother 95  . Colon cancer Neg Hx     ALLERGIES:  has No Known Allergies.  MEDICATIONS:  Current Outpatient Prescriptions  Medication Sig Dispense Refill  . ALPRAZolam (XANAX) 1 MG tablet Take 1 tablet (1 mg total) by mouth 3 (three) times daily as needed for anxiety. 90 tablet 0  . amoxicillin-clavulanate (AUGMENTIN) 875-125 MG tablet Take 1 tablet by mouth 2 (two) times daily. 14 tablet 0  . docusate sodium (COLACE) 100 MG capsule Take 1 capsule (100 mg total) by mouth 2 (two) times daily as needed. 60 capsule 5  . doxycycline (VIBRAMYCIN) 100 MG capsule Take 1 capsule (100 mg total) by mouth 2 (two) times daily. 14 capsule 0  . hydrALAZINE (APRESOLINE) 25 MG tablet Take 50 mg by mouth daily.     Marland Kitchen lactulose (CHRONULAC) 10 GM/15ML solution 30 g daily as needed for moderate constipation.     Marland Kitchen LORazepam (ATIVAN) 1 MG tablet Take 1 tablet (1 mg total) by mouth at bedtime. 30 tablet 0  .  megestrol (MEGACE) 40 MG/ML suspension Take 400 mg by mouth daily.     . meloxicam (MOBIC) 15 MG tablet Take 1 tablet (15 mg total) by mouth daily. 30 tablet 0  . Multiple Vitamins-Minerals (CENTRUM SILVER PO) Take 1 tablet by mouth daily.     Marland Kitchen oxyCODONE (OXYCONTIN) 80 mg 12 hr tablet Take 1 tablet (80 mg total) by mouth every 8 (eight) hours. 90 tablet 0  . oxycodone (ROXICODONE) 30 MG immediate release tablet Take 1 tablet (30 mg total) by mouth every 3 (three) hours as needed for pain. 120 tablet 0  . PARoxetine (PAXIL) 20 MG tablet Take 20 mg by mouth every morning.     . polyethylene glycol (MIRALAX / GLYCOLAX) packet Take 17 g by mouth daily as needed (constipation). 30 each 11  . prochlorperazine (COMPAZINE) 10 MG tablet Take 1 tablet (10 mg total) by mouth every 8 (eight) hours as needed for nausea or vomiting. 30 tablet 1  . SORAfenib (NEXAVAR) 200 MG tablet Take 2 tablets (400 mg total) by mouth 2 (two) times daily. Give on an empty stomach 1 hour before or 2 hours after meals. (Patient taking differently: Take 200-400 mg by mouth 2 (two) times daily. Take '400mg'$  once in the morning and then take '200mg'$  once in the evening. Give on an empty stomach 1 hour before or 2 hours after meals.) 120 tablet 0  . Rivaroxaban (XARELTO STARTER PACK) 15 & 20 MG TBPK Take as directed on package: Start with one '15mg'$  tablet by mouth twice a day with food. On Day 22, switch to one '20mg'$  tablet once a day with food. 51 each 0   No current facility-administered medications for this visit.     REVIEW OF SYSTEMS:   Constitutional: Denies fevers, chills or abnormal night sweats Eyes: Denies blurriness of vision, double vision or watery eyes Ears, nose, mouth, throat, and face: Denies mucositis or sore throat Respiratory: Denies cough, dyspnea or wheezes Cardiovascular: Denies palpitation, chest discomfort or lower extremity swelling Gastrointestinal:  Denies nausea, heartburn or change in bowel habits, (+)  abdominal pain  Skin: Denies abnormal  skin rashes Lymphatics: Denies new lymphadenopathy or easy bruising Neurological:Denies numbness, tingling or new weaknesses Behavioral/Psych: Mood is stable, no new changes (+) anxiety All other systems were reviewed with the patient and are negative.  PHYSICAL EXAMINATION: ECOG PERFORMANCE STATUS: 2  Vitals:   01/28/16 0856  BP: (!) 176/79  Pulse: 85  Resp: 17  Temp: 97.8 F (36.6 C)   Filed Weights   01/28/16 0856  Weight: 154 lb 9.6 oz (70.1 kg)   GENERAL:alert,  SKIN: skin color, texture, turgor are normal, no rashes or significant lesions EYES: normal, conjunctiva are pink and non-injected, sclera clear OROPHARYNX:no exudate, no erythema and lips, buccal mucosa, and tongue normal  NECK: supple, thyroid normal size, non-tender, without nodularity LYMPH:  no palpable lymphadenopathy in the cervical, axillary or inguinal LUNGS: clear to auscultation and percussion with normal breathing effort HEART: regular rate & rhythm and no murmurs and no lower extremity edema ABDOMEN:abdomen soft, non-tender and normal bowel sounds, (+) liver is enlarged, palpable 4-5cm below rib cage, mild tenderness, no significant ascites  Musculoskeletal:no cyanosis of digits and no clubbing  PSYCH: alert & oriented x 3 with fluent speech NEURO: no focal motor/sensory deficits  LABORATORY DATA:  I have reviewed the data as listed CBC Latest Ref Rng & Units 01/25/2016 01/22/2016 12/17/2015  WBC 4.0 - 10.5 K/uL 11.3(H) 8.3 8.8  Hemoglobin 13.0 - 17.0 g/dL 12.5(L) 11.4(L) 10.9(L)  Hematocrit 39.0 - 52.0 % 37.7(L) 34.1(L) 32.7(L)  Platelets 150 - 400 K/uL 280 264 304   CMP Latest Ref Rng & Units 01/25/2016 01/22/2016 12/17/2015  Glucose 65 - 99 mg/dL 88 110 110  BUN 6 - 20 mg/dL 13 13.1 11.8  Creatinine 0.61 - 1.24 mg/dL 1.08 1.1 1.0  Sodium 135 - 145 mmol/L 135 136 137  Potassium 3.5 - 5.1 mmol/L 4.2 3.8 4.2  Chloride 101 - 111 mmol/L 101 - -  CO2 22 - 32 mmol/L  '25 23 22  '$ Calcium 8.9 - 10.3 mg/dL 9.0 9.6 9.4  Total Protein 6.5 - 8.1 g/dL 8.5(H) 8.0 7.4  Total Bilirubin 0.3 - 1.2 mg/dL 0.7 0.77 0.50  Alkaline Phos 38 - 126 U/L 433(H) 522(H) 263(H)  AST 15 - 41 U/L 58(H) 66(H) 49(H)  ALT 17 - 63 U/L 43 52 38   AFP: 09/14/2012: 28.9 10/11/2015: 5.3 11/01/2015: 3.7 12/02/2015: 3.7 01/22/2016: 3.4   RADIOGRAPHIC STUDIES: I have personally reviewed the radiological images as listed and agreed with the findings in the report. Ct Abdomen Pelvis W Contrast  Result Date: 01/25/2016 CLINICAL DATA:  Sharp RUQ pain, abd pain, known liver CAOngoing Chemo EXAM: CT ABDOMEN AND PELVIS WITH CONTRAST TECHNIQUE: Multidetector CT imaging of the abdomen and pelvis was performed using the standard protocol following bolus administration of intravenous contrast. CONTRAST:  121m ISOVUE-300 IOPAMIDOL (ISOVUE-300) INJECTION 61% COMPARISON:  11/14/2015 FINDINGS: Lower chest: There is interstitial thickening with associated irregular focal peribronchovascular nodular type opacities, most evident in the right lung base including the right middle and lower lobes, with a small area noted in the medial left lower lobe. This is new since the prior exam. Dillon focal opacity is noted the anterior base of the right middle lobe and along the medial margin of the right middle lobe, which may reflect atelectasis. No pleural effusion. Heart normal size. Hepatobiliary: Heterogeneous enhancement and attenuation of the liver. There are focal areas of intrahepatic bile duct dilation in the right lobe. Ill-defined enhancing masses are evident. Thrombus fills the right, left and main portal veins.  Several venous collaterals extend through the porta hepatis. Renal veins appear patent. The gallbladder is mildly distended. No convincing stones. No bile duct dilation. Pancreas: Unremarkable. No pancreatic ductal dilatation or surrounding inflammatory changes. Spleen: Mildly enlarged measuring 14.8 cm from  superior inferior, previously 13.5 cm. No discrete spleen mass or focal lesion. Adrenals/Urinary Tract: Adrenal glands are unremarkable. Kidneys are normal, without renal calculi, focal lesion, or hydronephrosis. Bladder is unremarkable. Stomach/Bowel: No bowel dilation. No bowel wall thickening or inflammation. Appendix not visualized. Vascular/Lymphatic: Prominent and mildly enlarged gastrohepatic ligament lymph nodes, largest measuring 13 mm in short axis, similar to the prior CT. No other adenopathy. There is aortoiliac atherosclerotic disease with calcifications. Reproductive: Unremarkable Other: No abdominal wall hernia.  No ascites. Musculoskeletal: No osteoblastic or osteolytic lesions. No acute findings. IMPRESSION: 1. Findings consistent with multifocal liver carcinoma with extensive portal vein thrombosis, and subsequent development of porta hepatis venous collaterals. Thrombus in the central portal vein is no longer expansile, but still expands the Dillon peripheral portal vein and the right and left portal veins. There are few right liver bile ducts there are now dilated, which suggests mild progression of liver disease. 2. There is mild splenomegaly consistent or venous hypertension, which has mildly increased from the prior CT. 3. There are new lung abnormalities with interstitial thickening and ill-defined peribronchovascular nodular opacities mostly at the right lung base with a smaller area noted along the medial left lower lobe. The etiology may be infectious. It may be inflammatory, possibly related to chemotherapy. Neoplastic disease is also possible. 4. There is no other evidence of metastatic disease within the abdomen or pelvis. No acute findings within the abdomen or pelvis. Electronically Signed   By: Lajean Manes M.D.   On: 01/25/2016 12:39   Dg Knee Complete 4 Views Left  Result Date: 01/16/2016 CLINICAL DATA:  Left knee pain and swelling for 2 days. EXAM: LEFT KNEE - COMPLETE 4+ VIEW  COMPARISON:  None. FINDINGS: No fracture or dislocation is noted. Moderate suprapatellar joint effusion is noted. Severe narrowing of medial joint space is noted. Chondrocalcinosis is noted in lateral joint space. Osteophyte formation is noted laterally. IMPRESSION: Moderate suprapatellar joint effusion. Severe degenerative joint disease is noted medially. No fracture or dislocation is noted. Electronically Signed   By: Marijo Conception, M.D.   On: 01/16/2016 11:37     ASSESSMENT & PLAN: 66 y.o. Caucasian male with past medical history of successfully treated hepatitis C, liver cirrhosis, history of ascites and encephalopathy, recurrent HCC  1. Recurrent HCC, initially stage II -I previously reviewed his previous image and outside medical records extensively, confirmed key findings with patient and his family members -He initially had a multifocal disease, status post TACE 3 times in 2015 -he now has developed symptomatic local recurrence, with a 5cm new lesion in segment 8 with direct invasion into portal vein, and a portocaval lymph node. He is not a candidate for liver targeted therapy per his IR Dr. Delice Lesch -he is currently on first line systemic therapy with sorafenib, not able to tolerate full dose, doing well on 600 mg daily, will continue  -His recent CT abdomen and pelvis revealed extensive portal hypertension, and a probable cancer progression in the liver. -I recommend him to change sorafenib to second line Nivolumab, which was recently approved by FDA for unresectable HCC. Potential benefits and side effects, especially immune related side effects, we'll discussed with patient. He agrees to proceed. I'll schedule him to start next week.  -the  goal of therapy is palliative   2. Abdominal pain -Secondary to [facility] and portal vein thrombosis -overall better now with oxycodone and OxyContin, he is on very high dose, overall pain is controlled  -continue OxyContin 80 mg every 8 hours, and oxycodone  '30mg'$  every 3-4 hours. I will refilled Oxycodone today. -I recommended him to see pain management clinic, he declined.  3. Portal vein thrombosis -Worse on the recent CT scan. I think it probably contributes to his abdominal pain also -I recommend him to start anticoagulation. The options of Lovenox injection, Coumadin, and Xarelto were discussed with patient, he opted to Xarelto. I'll start him on 15 mg twice daily for 3 weeks, then '20mg'$  daily -We discussed the moderate to high risk of bleeding with Xarelto due to his liver cirrhosis and he knows to avoid injury and fall   4 Liver cirrhosis secondary to hepatitis C and alcohol, history of encephalopathy and ascites -He will continue follow-up with [facility], his ascites is well controlled with diuretics -He knows to use laxative, especially lactulose for his constipation  5. Hep C, successfully treated -Continue follow-up with liver clinic NP [facility]  6. Anxiety and insomnia  -He has Xanax for anxiety. I have refilled Xanax 1 mg today. -continue Ativan 1 mg at bedtime as needed for insomnia, he knows not to take with Xanax together, he knows not to take during the day. He does not feel Ativan is working well for sleep, he may take xanax for insomnia   7. Goal of care discussion, DNR/DNI  -We again discussed the incurable nature of his cancer, and the overall poor prognosis, especially if he does not have good response to treatment -The patient understands the goal of care is palliative. -I recommend DNR/DNI, he agrees    Plan -He will finish the sorafenib he has (6-8 tab) then stop -We'll start him on Nivolumab 240 mg every 2 weeks, in one week -I reviewed his oxycodone 30 mg today -I'll plan to see him back in 3 weeks  All questions were answered. The patient knows to call the clinic with any problems, questions or concerns. I spent 30 minutes counseling the patient face to face. The total time spent in the appointment was 40 minutes and  Dillon than 50% was on counseling.    Truitt Merle, MD 01/28/2016

## 2016-01-28 ENCOUNTER — Encounter: Payer: Self-pay | Admitting: Hematology

## 2016-01-28 ENCOUNTER — Ambulatory Visit (HOSPITAL_BASED_OUTPATIENT_CLINIC_OR_DEPARTMENT_OTHER): Payer: Medicare Other | Admitting: Hematology

## 2016-01-28 ENCOUNTER — Telehealth: Payer: Self-pay | Admitting: Hematology

## 2016-01-28 VITALS — BP 176/79 | HR 85 | Temp 97.8°F | Resp 17 | Ht 73.0 in | Wt 154.6 lb

## 2016-01-28 DIAGNOSIS — G4700 Insomnia, unspecified: Secondary | ICD-10-CM | POA: Diagnosis not present

## 2016-01-28 DIAGNOSIS — G893 Neoplasm related pain (acute) (chronic): Secondary | ICD-10-CM

## 2016-01-28 DIAGNOSIS — Z8619 Personal history of other infectious and parasitic diseases: Secondary | ICD-10-CM | POA: Diagnosis not present

## 2016-01-28 DIAGNOSIS — I81 Portal vein thrombosis: Secondary | ICD-10-CM

## 2016-01-28 DIAGNOSIS — F419 Anxiety disorder, unspecified: Secondary | ICD-10-CM | POA: Diagnosis not present

## 2016-01-28 DIAGNOSIS — K7469 Other cirrhosis of liver: Secondary | ICD-10-CM

## 2016-01-28 DIAGNOSIS — C220 Liver cell carcinoma: Secondary | ICD-10-CM

## 2016-01-28 DIAGNOSIS — Z7189 Other specified counseling: Secondary | ICD-10-CM

## 2016-01-28 MED ORDER — RIVAROXABAN (XARELTO) VTE STARTER PACK (15 & 20 MG): ORAL_TABLET | ORAL | 0 refills | Status: DC

## 2016-01-28 MED ORDER — OXYCODONE HCL 30 MG PO TABS: 30.0000 mg | ORAL_TABLET | ORAL | 0 refills | Status: DC | PRN

## 2016-01-28 MED FILL — XARELTO STARTER PACK: 15 & 20 | 30 days supply | Qty: 51 | Fill #0

## 2016-01-28 NOTE — Progress Notes (Signed)
START OFF PATHWAY REGIMEN - [Other Dx]  Nivolumab 240 mg q14 Days  [id number]:Nivolumab 240 mg q14 Days:   A cycle is every 14 days:     Nivolumab (Opdivo(R)) 240 mg flat dose in 100 mL NS IV over 60 minutes. Inline filter required (low protein binding) Dose Mod: None Additional Orders: Severe immune-mediated reactions can occur (e.g. pneumonitis, colitis, and hepatitis). See prescribing information for more details including monitoring and required immediate management with steroids. Monitor thyroid, renal, liver  function tests, glucose, and sodium at baseline and periodically during therapy.  **Always confirm dose/schedule in your pharmacy ordering system**    Intent of Therapy: Non-Curative / Palliative Intent, Discussed with Patient

## 2016-01-28 NOTE — Telephone Encounter (Signed)
Gave patient avs report and appointments for January  °

## 2016-01-29 MED FILL — oxyCODONE HCL 30 MG TABS: 30 | 15 days supply | Qty: 120 | Fill #0

## 2016-02-04 ENCOUNTER — Other Ambulatory Visit (HOSPITAL_BASED_OUTPATIENT_CLINIC_OR_DEPARTMENT_OTHER): Payer: Medicare Other

## 2016-02-04 ENCOUNTER — Ambulatory Visit (HOSPITAL_BASED_OUTPATIENT_CLINIC_OR_DEPARTMENT_OTHER): Payer: Medicare Other

## 2016-02-04 VITALS — BP 136/75 | HR 74 | Temp 97.9°F | Resp 18

## 2016-02-04 DIAGNOSIS — Z79899 Other long term (current) drug therapy: Secondary | ICD-10-CM | POA: Diagnosis not present

## 2016-02-04 DIAGNOSIS — C220 Liver cell carcinoma: Secondary | ICD-10-CM

## 2016-02-04 DIAGNOSIS — Z5112 Encounter for antineoplastic immunotherapy: Secondary | ICD-10-CM | POA: Diagnosis present

## 2016-02-04 LAB — CBC WITH DIFFERENTIAL/PLATELET
BASO%: 0.4 % (ref 0.0–2.0)
Basophils Absolute: 0 10*3/uL (ref 0.0–0.1)
EOS%: 1 % (ref 0.0–7.0)
Eosinophils Absolute: 0.1 10*3/uL (ref 0.0–0.5)
HEMATOCRIT: 33.6 % — AB (ref 38.4–49.9)
HGB: 11.3 g/dL — ABNORMAL LOW (ref 13.0–17.1)
LYMPH#: 3.4 10*3/uL — AB (ref 0.9–3.3)
LYMPH%: 36.5 % (ref 14.0–49.0)
MCH: 28.4 pg (ref 27.2–33.4)
MCHC: 33.6 g/dL (ref 32.0–36.0)
MCV: 84.4 fL (ref 79.3–98.0)
MONO#: 0.8 10*3/uL (ref 0.1–0.9)
MONO%: 8.1 % (ref 0.0–14.0)
NEUT%: 54 % (ref 39.0–75.0)
NEUTROS ABS: 5 10*3/uL (ref 1.5–6.5)
PLATELETS: 204 10*3/uL (ref 140–400)
RBC: 3.98 10*6/uL — AB (ref 4.20–5.82)
RDW: 14.3 % (ref 11.0–14.6)
WBC: 9.3 10*3/uL (ref 4.0–10.3)

## 2016-02-04 LAB — COMPREHENSIVE METABOLIC PANEL
ALT: 40 U/L (ref 0–55)
ANION GAP: 11 meq/L (ref 3–11)
AST: 66 U/L — ABNORMAL HIGH (ref 5–34)
Albumin: 3 g/dL — ABNORMAL LOW (ref 3.5–5.0)
Alkaline Phosphatase: 390 U/L — ABNORMAL HIGH (ref 40–150)
BILIRUBIN TOTAL: 0.43 mg/dL (ref 0.20–1.20)
BUN: 18.6 mg/dL (ref 7.0–26.0)
CALCIUM: 9.2 mg/dL (ref 8.4–10.4)
CO2: 22 meq/L (ref 22–29)
CREATININE: 1.3 mg/dL (ref 0.7–1.3)
Chloride: 105 mEq/L (ref 98–109)
EGFR: 58 mL/min/{1.73_m2} — ABNORMAL LOW (ref >90)
Glucose: 104 mg/dl (ref 70–140)
Potassium: 3.8 mEq/L (ref 3.5–5.1)
Sodium: 137 mEq/L (ref 136–145)
TOTAL PROTEIN: 7.6 g/dL (ref 6.4–8.3)

## 2016-02-04 LAB — TSH: TSH: 3.121 m(IU)/L (ref 0.320–4.118)

## 2016-02-04 MED ORDER — SODIUM CHLORIDE 0.9 % IV SOLN
Freq: Once | INTRAVENOUS | Status: AC
  Administered 2016-02-04: 14:00:00 via INTRAVENOUS

## 2016-02-04 MED ORDER — SODIUM CHLORIDE 0.9 % IV SOLN
240.0000 mg | Freq: Once | INTRAVENOUS | Status: AC
  Administered 2016-02-04: 240 mg via INTRAVENOUS
  Filled 2016-02-04: qty 4

## 2016-02-04 NOTE — Progress Notes (Signed)
Pt tolerated infusion well. Pt and VS stable at discharge.  

## 2016-02-04 NOTE — Patient Instructions (Signed)
[facility] Discharge Instructions for Patients Receiving Chemotherapy  Today you received the following chemotherapy agents: Nivolumab   To help prevent nausea and vomiting after your treatment, we encourage you to take your nausea medication as directed.    If you develop nausea and vomiting that is not controlled by your nausea medication, call the clinic.   BELOW ARE SYMPTOMS THAT SHOULD BE REPORTED IMMEDIATELY:  *FEVER GREATER THAN 100.5 F  *CHILLS WITH OR WITHOUT FEVER  NAUSEA AND VOMITING THAT IS NOT CONTROLLED WITH YOUR NAUSEA MEDICATION  *UNUSUAL SHORTNESS OF BREATH  *UNUSUAL BRUISING OR BLEEDING  TENDERNESS IN MOUTH AND THROAT WITH OR WITHOUT PRESENCE OF ULCERS  *URINARY PROBLEMS  *BOWEL PROBLEMS  UNUSUAL RASH Items with * indicate a potential emergency and should be followed up as soon as possible.  Feel free to call the clinic you have any questions or concerns. The clinic phone number is [phone].  Please show the [facility] at check-in to the Emergency Department and triage nurse.   Nivolumab injection What is this medicine? NIVOLUMAB (nye VOL ue mab) is a monoclonal antibody. It is used to treat melanoma, lung cancer, kidney cancer, head and neck cancer, Hodgkin lymphoma, and urothelial cancer. COMMON BRAND NAME(S): Opdivo What should I tell my health care provider before I take this medicine? They need to know if you have any of these conditions: -diabetes -immune system problems -kidney disease -liver disease -lung disease -organ transplant -stomach or intestine problems -thyroid disease -an unusual or allergic reaction to nivolumab, other medicines, foods, dyes, or preservatives -pregnant or trying to get pregnant -breast-feeding How should I use this medicine? This medicine is for infusion into a vein. It is given by a health care professional in a hospital or clinic setting. A special MedGuide will be given to you  before each treatment. Be sure to read this information carefully each time. Talk to your pediatrician regarding the use of this medicine in children. Special care may be needed. What if I miss a dose? It is important not to miss your dose. Call your doctor or health care professional if you are unable to keep an appointment. What may interact with this medicine? Interactions have not been studied. Give your health care provider a list of all the medicines, herbs, non-prescription drugs, or dietary supplements you use. Also tell them if you smoke, drink alcohol, or use illegal drugs. Some items may interact with your medicine. What should I watch for while using this medicine? This drug may make you feel generally unwell. Continue your course of treatment even though you feel ill unless your doctor tells you to stop. You may need blood work done while you are taking this medicine. Do not become pregnant while taking this medicine or for 5 months after stopping it. Women should inform their doctor if they wish to become pregnant or think they might be pregnant. There is a potential for serious side effects to an unborn child. Talk to your health care professional or pharmacist for more information. Do not breast-feed an infant while taking this medicine. What side effects may I notice from receiving this medicine? Side effects that you should report to your doctor or health care professional as soon as possible: -allergic reactions like skin rash, itching or hives, swelling of the face, lips, or tongue -black, tarry stools -blood in the urine -bloody or watery diarrhea -changes in vision -change in sex drive -changes in emotions or moods -chest pain -  confusion -cough -decreased appetite -diarrhea -facial flushing -feeling faint or lightheaded -fever, chills -hair loss -hallucination, loss of contact with reality -headache -irritable -joint pain -loss of memory -muscle pain -muscle  weakness -seizures -shortness of breath -signs and symptoms of high blood sugar such as dizziness; dry mouth; dry skin; fruity breath; nausea; stomach pain; increased hunger or thirst; increased urination -signs and symptoms of kidney injury like trouble passing urine or change in the amount of urine -signs and symptoms of liver injury like dark yellow or brown urine; general ill feeling or flu-like symptoms; light-colored stools; loss of appetite; nausea; right upper belly pain; unusually weak or tired; yellowing of the eyes or skin -stiff neck -swelling of the ankles, feet, hands -weight gain Side effects that usually do not require medical attention (report to your doctor or health care professional if they continue or are bothersome): -bone pain -constipation -tiredness -vomiting Where should I keep my medicine? This drug is given in a hospital or clinic and will not be stored at home.  2017 [employer] (2015-02-22 09:04:36)

## 2016-02-05 ENCOUNTER — Telehealth: Payer: Self-pay | Admitting: *Deleted

## 2016-02-05 NOTE — Telephone Encounter (Signed)
Called & spoke to sig. Other & she informed that pt is sleeping & she hasn't heard any c/o's from pt & knows to call if any concerns.

## 2016-02-05 NOTE — Telephone Encounter (Signed)
-----   Message from Egbert Garibaldi, RN sent at 02/04/2016  4:10 PM EST ----- Regarding: Dr. Burr Medico, chemo f/u call  Pt of Dr. Burr Medico, first time Nivolumab. Pt tolerated well.

## 2016-02-13 NOTE — Progress Notes (Signed)
[place]  Telephone:([phone]) [phone] Fax:(336) [phone]  Clinic Follow up Note   Patient Care Team: Daneil Dolin, MD as Attending Physician (Gastroenterology) Antonietta Jewel, MD as Referring Physician (Internal Medicine) Truitt Merle, MD as Consulting Physician (Hematology) [name], CRNP as Nurse Practitioner (Nurse Practitioner) 02/18/2016   CHIEF COMPLAINTS:  Follow up recurrent [place]   Oncology History   Hepatocellular carcinoma ([place])   Staging form: Liver (Excluding Intrahepatic Bile Ducts), AJCC 7th Edition   - Clinical stage from 04/16/2012: Stage II (T2(m), N0, M0) - Signed by Truitt Merle, MD on 10/12/2015   - Pathologic stage from 04/16/2013: Stage II (T2, N0, cM0) - Signed by Truitt Merle, MD on 10/12/2015      Hepatocellular carcinoma ([place])   09/14/2012 Tumor Marker    AFP 28.9      04/16/2013 Imaging    Abdominal MRI with and without contrast reviewed multifocal (4) liver lesions in both left and right lobes, most consistent with [place], largest measuring 2.7 cm      04/16/2013 Initial Diagnosis    Hepatocellular carcinoma ([place])      04/28/2013 Procedure    Right TACE with lipiodol       06/15/2013 Procedure    Left TACE with [facility]       07/14/2013 Procedure    Right TACE with [facility]      09/05/2015 Imaging    CT abdomen with and without contrast showed a new 5.0 x 2.3 cm mass in the hepatic segment 8, invading and occluding the right anterior portal vein, most compatible with [place]. A portacaval node measuring 3.0 x 1.4 cm, previously 2.9 x 1.1 cm.      10/11/2015 Tumor Marker    AFP 5.3      11/11/2015 -  Chemotherapy    sorafenib '200mg'$  bid started on 11/11/2015, increased to '400mg'$  bid in 2 weeks       11/14/2015 Imaging    CT CAP 11/14/2015 IMPRESSION: Chest Impression: 1. RIGHT upper lobe pulmonary nodule is indeterminate. No comparison available. 2. Ground-glass opacity and peripheral nodules in the RIGHT middle lobe appear post infectious or  inflammatory.  Abdomen / Pelvis Impression: 1. 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 Thrombus also likely within the LEFT and RIGHT portal vein. Difficult to distinguish tumor thrombus versus bland thrombus. 2. Individual lesions are difficult to define in the RIGHT hepatic lobe. There is overall impression of progression of disease in the RIGHT hepatic lobe with multiple ill-defined enhancing lesions. 3. Periportal and shotty retroperitoneal adenopathy similar to prior.      01/25/2016 Imaging    CT Abdomen Pelvis w/ Contrast IMPRESSION: 1. Findings consistent with multifocal liver carcinoma with extensive portal vein thrombosis, and subsequent development of porta hepatis venous collaterals. Thrombus in the central portal vein is no longer expansile, but still expands the more peripheral portal vein and the right and left portal veins. There are few right liver bile ducts there are now dilated, which suggests mild progression of liver disease. 2. There is mild splenomegaly consistent or venous hypertension, which has mildly increased from the prior CT. 3. There are new lung abnormalities with interstitial thickening and ill-defined peribronchovascular nodular opacities mostly at the right lung base with a smaller area noted along the medial left lower lobe. The etiology may be infectious. It may be inflammatory, possibly related to chemotherapy. Neoplastic disease is also possible. 4.  There is no other evidence of metastatic disease within the abdomen or pelvis. No acute findings within the abdomen or pelvis      01/25/2016 - 01/25/2016 Hospital Admission    Pt was seen in ED for worsening abdominal pain, treated with pain meds       02/04/2016 -  Chemotherapy    The patient started on immunotherapy treatment nivolumab every 2 weeks        HISTORY OF PRESENTING ILLNESS  (10/11/2015):  Kevin Dillon 66 y.o. male with past medical history of treated hepatitis C, liver cirrhosis, and hepatocellular carcinoma is here because of recurrent hepatocellular carcinoma. He is accompanied by his significant other [name] and her sister.  He was diagnosed with hepatocellular carcinoma in 2015, his initial image result is not available and staging is unknown. He was seen by interventional radiologist Dr. Delice Lesch at [facility] in [place] and underwent TACE procedure 3 times in 2015. He was subsequently followed, and recently repeated CT scan showed a new 5.0 cm mass in segment 8, with portal vein invasion. He was felt not to be a candidate for further liver targeted therapy, and was referred to [place] for further systemic therapy.  He complains about mid epigastric pain for the past 2 years, which significantly improved after his TACE procedure in 2015. It has been getting worse lately in the past 6 months. He states he gets about 7-8 out of 10, persistent, he has been taking oxycodone 20 mg every 6 hours, but his pain is not well controlled. He is quite fatigued, is low appetite. He last 40 lbs in the pat 4 months. He is able to function at home, in trying to remain to be physically active.  His hepatitis C was successfully treated. He has history of hepatitis C, complicated with ascites and encephalopathy, but it has been well controlled with medical management.   CURRENT THERAPY: Nivolumab '240mg'$  every 2 weeks, started on 02/04/2015  INTERIM HISTORY: Alois returns for follow up. He feels good today and has felt much better the last week or two. He did his first immunotherapy treatment 2 weeks ago. He did well and did not have any side effects. He has not been taking his BP medication. He doesn't have a PCP. He has been taking Xarelto. He is on day 32. He went to the ED on 01/25/2016 due to a blood clot. Denies any other concerns.   MEDICAL HISTORY:  Past Medical History:  Diagnosis  Date  . Cancer ([place])    liver  . Cholelithiasis   . Chronic hepatitis C ([place])   . Chronic knee pain   . Chronic left shoulder pain   . CVA (cerebral infarction)   . Gout   . Hepatitis C    genotype 1b.  pt has been vaccinated against hep A and B.  . Hypertension   . Osteoarthritis   . Schatzki's ring   . Tubular adenoma of colon 12/2011    SURGICAL HISTORY: Past Surgical History:  Procedure Laterality Date  . AMPUTATION Left 09/17/2012   Procedure: SMALL FINGER EXTENSOR TENDON REPAIR; METACARPAL LEVEL AMPUTATION RING FINGER; PROXIMAL PHALANX LEVEL AMPUTATION LONG FINGER;  Surgeon: Schuyler Amor, MD;  Location: [facility];  Service: Orthopedics;  Laterality: Left;  . Arm surgery     right/plate in arm  . COLONOSCOPY WITH ESOPHAGOGASTRODUODENOSCOPY (EGD)  12/30/2011   RMR: Noncritical Schatzki's ring;  Hiatal hernia, Tubular ADENOMA removed from splenic flexure, otherwise normal colonoscopy  .  LEG SURGERY     left  . WOUND EXPLORATION Left 09/17/2012   Procedure: WOUND EXPLORATION;  Surgeon: Schuyler Amor, MD;  Location: [facility];  Service: Orthopedics;  Laterality: Left;    SOCIAL HISTORY: Social History   Social History  . Marital status: Single    Spouse name: N/A  . Number of children: 0  . Years of education: N/A   Occupational History  . disabled    Social History Main Topics  . Smoking status: Former Smoker    Packs/day: 0.50    Years: 40.00    Types: Cigarettes    Quit date: 01/19/2012  . Smokeless tobacco: Never Used     Comment: Smokes 1/2 pack of cigarettes daily  . Alcohol use No     Comment: HX 2 beers 3-4 days per week; QUIT DEC 2013  . Drug use: No     Comment: Hx cocaine yrs ago, Last marijuana OCT 2013  . Sexual activity: Not Currently    Partners: Female   Other Topics Concern  . Not on file   Social History Narrative   Lives w/ significant other, [name]    FAMILY HISTORY: Family History  Problem Relation Age of Onset  .  Cirrhosis Father 10  . Cancer Father     liver cancer   . Lung cancer Mother 11  . Colon cancer Neg Hx     ALLERGIES:  has No Known Allergies.  MEDICATIONS:  Current Outpatient Prescriptions  Medication Sig Dispense Refill  . ALPRAZolam (XANAX) 1 MG tablet Take 1 tablet (1 mg total) by mouth 3 (three) times daily as needed for anxiety. 90 tablet 0  . docusate sodium (COLACE) 100 MG capsule Take 1 capsule (100 mg total) by mouth 2 (two) times daily as needed. 60 capsule 5  . hydrALAZINE (APRESOLINE) 25 MG tablet Take 50 mg by mouth daily.     Marland Kitchen lactulose (CHRONULAC) 10 GM/15ML solution 30 g daily as needed for moderate constipation.     Marland Kitchen LORazepam (ATIVAN) 1 MG tablet Take 1 tablet (1 mg total) by mouth at bedtime. 30 tablet 0  . megestrol (MEGACE) 40 MG/ML suspension Take 400 mg by mouth daily.     . Multiple Vitamins-Minerals (CENTRUM SILVER PO) Take 1 tablet by mouth daily.     Marland Kitchen oxyCODONE (OXYCONTIN) 80 mg 12 hr tablet Take 1 tablet (80 mg total) by mouth every 8 (eight) hours. 90 tablet 0  . oxycodone (ROXICODONE) 30 MG immediate release tablet Take 1 tablet (30 mg total) by mouth every 3 (three) hours as needed for pain. 120 tablet 0  . PARoxetine (PAXIL) 20 MG tablet Take 20 mg by mouth every morning.     . polyethylene glycol (MIRALAX / GLYCOLAX) packet Take 17 g by mouth daily as needed (constipation). 30 each 11  . prochlorperazine (COMPAZINE) 10 MG tablet Take 1 tablet (10 mg total) by mouth every 8 (eight) hours as needed for nausea or vomiting. 30 tablet 1  . Rivaroxaban (XARELTO STARTER PACK) 15 & 20 MG TBPK Take as directed on package: Start with one '15mg'$  tablet by mouth twice a day with food. On Day 22, switch to one '20mg'$  tablet once a day with food. 51 each 0   No current facility-administered medications for this visit.    Facility-Administered Medications Ordered in Other Visits  Medication Dose Route Frequency Provider Last Rate Last Dose  . sodium chloride flush  (NS) 0.9 % injection 10 mL  10 mL Intracatheter PRN Truitt Merle, MD        REVIEW OF SYSTEMS:   Constitutional: Denies fevers, chills or abnormal night sweats Eyes: Denies blurriness of vision, double vision or watery eyes Ears, nose, mouth, throat, and face: Denies mucositis or sore throat Respiratory: Denies cough, dyspnea or wheezes Cardiovascular: Denies palpitation, chest discomfort or lower extremity swelling Gastrointestinal:  Denies nausea, heartburn or change in bowel habits, (+) abdominal pain  Skin: Denies abnormal skin rashes Lymphatics: Denies new lymphadenopathy or easy bruising Neurological:Denies numbness, tingling or new weaknesses Behavioral/Psych: Mood is stable, no new changes (+) anxiety All other systems were reviewed with the patient and are negative.  PHYSICAL EXAMINATION: ECOG PERFORMANCE STATUS: 2  Vitals:   02/18/16 1020  BP: 135/79  Pulse: 83  Resp: 16  Temp: 97.7 F (36.5 C)   Filed Weights   02/18/16 1020  Weight: 155 lb 11.2 oz (70.6 kg)    GENERAL:alert,  SKIN: skin color, texture, turgor are normal, no rashes or significant lesions EYES: normal, conjunctiva are pink and non-injected, sclera clear OROPHARYNX:no exudate, no erythema and lips, buccal mucosa, and tongue normal  NECK: supple, thyroid normal size, non-tender, without nodularity LYMPH:  no palpable lymphadenopathy in the cervical, axillary or inguinal LUNGS: clear to auscultation and percussion with normal breathing effort HEART: regular rate & rhythm and no murmurs and no lower extremity edema ABDOMEN:abdomen soft, non-tender and normal bowel sounds, (+) liver is enlarged, palpable 4-5cm below rib cage, mild tenderness, no significant ascites  Musculoskeletal:no cyanosis of digits and no clubbing  PSYCH: alert & oriented x 3 with fluent speech NEURO: no focal motor/sensory deficits  LABORATORY DATA:  I have reviewed the data as listed CBC Latest Ref Rng & Units 02/18/2016 02/04/2016  01/25/2016  WBC 4.0 - 10.3 10e3/uL 8.7 9.3 11.3(H)  Hemoglobin 13.0 - 17.1 g/dL 11.1(L) 11.3(L) 12.5(L)  Hematocrit 38.4 - 49.9 % 33.3(L) 33.6(L) 37.7(L)  Platelets 140 - 400 10e3/uL 209 204 280   CMP Latest Ref Rng & Units 02/18/2016 02/04/2016 01/25/2016  Glucose 70 - 140 mg/dl 133 104 88  BUN 7.0 - 26.0 mg/dL 17.7 18.6 13  Creatinine 0.7 - 1.3 mg/dL 1.2 1.3 1.08  Sodium 136 - 145 mEq/L 137 137 135  Potassium 3.5 - 5.1 mEq/L 4.1 3.8 4.2  Chloride 101 - 111 mmol/L - - 101  CO2 22 - 29 mEq/L '22 22 25  '$ Calcium 8.4 - 10.4 mg/dL 9.4 9.2 9.0  Total Protein 6.4 - 8.3 g/dL 7.7 7.6 8.5(H)  Total Bilirubin 0.20 - 1.20 mg/dL 0.52 0.43 0.7  Alkaline Phos 40 - 150 U/L 280(H) 390(H) 433(H)  AST 5 - 34 U/L 53(H) 66(H) 58(H)  ALT 0 - 55 U/L 39 40 43   AFP: 09/14/2012: 28.9 10/11/2015: 5.3 11/01/2015: 3.7 12/02/2015: 3.7 01/22/2016: 3.4 02/18/2016: PENDING  RADIOGRAPHIC STUDIES: I have personally reviewed the radiological images as listed and agreed with the findings in the report. Ct Chest W Contrast  Result Date: 02/14/2016 CLINICAL DATA:  Restaging lung cancer EXAM: CT CHEST, ABDOMEN, AND PELVIS WITH CONTRAST TECHNIQUE: Multidetector CT imaging of the chest, abdomen and pelvis was performed following the standard protocol during bolus administration of intravenous contrast. CONTRAST:  1 ISOVUE-300 IOPAMIDOL (ISOVUE-300) INJECTION 61% COMPARISON:  01/25/2016 FINDINGS: CT CHEST FINDINGS Chest wall: No chest wall mass is identified. Small scattered supraclavicular and axillary lymph nodes appears stable. The thyroid gland is grossly normal. Cardiovascular: The heart is normal in size. No pericardial effusion. The aorta  is normal in caliber. No dissection. Minimal scattered stable atherosclerotic calcifications. The branch vessels are patent. Stable coronary artery calcifications. Mediastinum/Nodes: Slight interval enlargement of mediastinal lymph nodes. Upper mediastinal node between the right brachiocephalic  artery and the left carotid artery on image number 30 measures 9.5 mm. This previously measured 7.5 mm. Pretracheal lymph node on image 44 measures 9.5 mm and previously measured 8.5 mm. Right paratracheal lymph node on image 38 measures 8.5 mm and previously measured 7.5 mm. Subcarinal lymph node on image number 59 measures 14 mm and previously measured 7 mm. Right paraesophageal lymph nodes of also enlarged. The nodule on image number 92 measures 12.5 mm and previously measured 10 mm. Lungs/Pleura: Right upper lobe pulmonary nodule on image 41 measures 12.5 x 7.5 mm. Previously measured 7 x 11 mm. Stable emphysematous changes. In persistent tree-in-bud appearance in the right middle lobe likely chronic inflammation or atypical infection. Significant peribronchial thickening in the lower lobes bilaterally with a few patchy areas of airspace opacity. Findings suspicious for bronchitis and bronchopneumonia. Findings are similar to the prior CT from 01/25/2016 although there is less ground-glass opacity. Aspiration would be another consideration. Musculoskeletal: No significant bony findings. CT ABDOMEN PELVIS FINDINGS Hepatobiliary: Chronic cirrhotic changes involving the liver. Chronic portal vein thrombosis with extensive portal venous collaterals and cavernous transformation. Small arterial phase enhancing lesions are demonstrated but appear improved since the prior study and suspect a good response to treatment. Stable diffuse gallbladder wall thickening. Pancreas: No mass, inflammation or ductal dilatation. The splenic vein is patent. Spleen: Stable splenomegaly.  No focal splenic lesions. Adrenals/Urinary Tract: The adrenal glands and kidneys are unremarkable and stable. Stomach/Bowel: The stomach, duodenum, small bowel and colon are unremarkable. No inflammatory changes, mass lesions or obstructive findings. Vascular/Lymphatic: Stable advanced atherosclerotic calcifications involving the aorta and branch  vessels. No aneurysm or dissection. The major venous structures are patent except for the portal vein. Stable borderline enlarged upper abdominal lymph nodes likely related to the patient's cirrhosis. Reproductive: The prostate gland and seminal vesicles are unremarkable. Other: No pelvic mass or adenopathy. No free pelvic fluid collections. No inguinal mass or adenopathy. No abdominal wall hernia or subcutaneous lesions. Musculoskeletal: No significant bony findings. IMPRESSION: 1. Slight interval increase in size of the mediastinal lymph nodes. 2. Persistent extensive lower lobe peribronchial thickening and patchy infiltrates. Possible aspiration pneumonia. 3. Persistent tree-in-bud appearance in the right middle lobe, likely chronic inflammation or atypical infection. 4. Improved CT appearance of the liver. Two small residual arterial phase enhancing lesions are identified. 5. Chronic portal vein thrombosis with cavernous transformation of portal venous collaterals. Esophageal varices are again noted. 6. Stable upper abdominal lymph nodes likely related to cirrhosis. Electronically Signed   By: Marijo Sanes M.D.   On: 02/14/2016 16:27   Ct Abdomen Pelvis W Contrast  Result Date: 02/14/2016 CLINICAL DATA:  Restaging lung cancer EXAM: CT CHEST, ABDOMEN, AND PELVIS WITH CONTRAST TECHNIQUE: Multidetector CT imaging of the chest, abdomen and pelvis was performed following the standard protocol during bolus administration of intravenous contrast. CONTRAST:  1 ISOVUE-300 IOPAMIDOL (ISOVUE-300) INJECTION 61% COMPARISON:  01/25/2016 FINDINGS: CT CHEST FINDINGS Chest wall: No chest wall mass is identified. Small scattered supraclavicular and axillary lymph nodes appears stable. The thyroid gland is grossly normal. Cardiovascular: The heart is normal in size. No pericardial effusion. The aorta is normal in caliber. No dissection. Minimal scattered stable atherosclerotic calcifications. The branch vessels are patent.  Stable coronary artery calcifications. Mediastinum/Nodes: Slight interval enlargement of mediastinal  lymph nodes. Upper mediastinal node between the right brachiocephalic artery and the left carotid artery on image number 30 measures 9.5 mm. This previously measured 7.5 mm. Pretracheal lymph node on image 44 measures 9.5 mm and previously measured 8.5 mm. Right paratracheal lymph node on image 38 measures 8.5 mm and previously measured 7.5 mm. Subcarinal lymph node on image number 59 measures 14 mm and previously measured 7 mm. Right paraesophageal lymph nodes of also enlarged. The nodule on image number 92 measures 12.5 mm and previously measured 10 mm. Lungs/Pleura: Right upper lobe pulmonary nodule on image 41 measures 12.5 x 7.5 mm. Previously measured 7 x 11 mm. Stable emphysematous changes. In persistent tree-in-bud appearance in the right middle lobe likely chronic inflammation or atypical infection. Significant peribronchial thickening in the lower lobes bilaterally with a few patchy areas of airspace opacity. Findings suspicious for bronchitis and bronchopneumonia. Findings are similar to the prior CT from 01/25/2016 although there is less ground-glass opacity. Aspiration would be another consideration. Musculoskeletal: No significant bony findings. CT ABDOMEN PELVIS FINDINGS Hepatobiliary: Chronic cirrhotic changes involving the liver. Chronic portal vein thrombosis with extensive portal venous collaterals and cavernous transformation. Small arterial phase enhancing lesions are demonstrated but appear improved since the prior study and suspect a good response to treatment. Stable diffuse gallbladder wall thickening. Pancreas: No mass, inflammation or ductal dilatation. The splenic vein is patent. Spleen: Stable splenomegaly.  No focal splenic lesions. Adrenals/Urinary Tract: The adrenal glands and kidneys are unremarkable and stable. Stomach/Bowel: The stomach, duodenum, small bowel and colon are  unremarkable. No inflammatory changes, mass lesions or obstructive findings. Vascular/Lymphatic: Stable advanced atherosclerotic calcifications involving the aorta and branch vessels. No aneurysm or dissection. The major venous structures are patent except for the portal vein. Stable borderline enlarged upper abdominal lymph nodes likely related to the patient's cirrhosis. Reproductive: The prostate gland and seminal vesicles are unremarkable. Other: No pelvic mass or adenopathy. No free pelvic fluid collections. No inguinal mass or adenopathy. No abdominal wall hernia or subcutaneous lesions. Musculoskeletal: No significant bony findings. IMPRESSION: 1. Slight interval increase in size of the mediastinal lymph nodes. 2. Persistent extensive lower lobe peribronchial thickening and patchy infiltrates. Possible aspiration pneumonia. 3. Persistent tree-in-bud appearance in the right middle lobe, likely chronic inflammation or atypical infection. 4. Improved CT appearance of the liver. Two small residual arterial phase enhancing lesions are identified. 5. Chronic portal vein thrombosis with cavernous transformation of portal venous collaterals. Esophageal varices are again noted. 6. Stable upper abdominal lymph nodes likely related to cirrhosis. Electronically Signed   By: Marijo Sanes M.D.   On: 02/14/2016 16:27   Ct Abdomen Pelvis W Contrast  Result Date: 01/25/2016 CLINICAL DATA:  Sharp RUQ pain, abd pain, known liver CAOngoing Chemo EXAM: CT ABDOMEN AND PELVIS WITH CONTRAST TECHNIQUE: Multidetector CT imaging of the abdomen and pelvis was performed using the standard protocol following bolus administration of intravenous contrast. CONTRAST:  165m ISOVUE-300 IOPAMIDOL (ISOVUE-300) INJECTION 61% COMPARISON:  11/14/2015 FINDINGS: Lower chest: There is interstitial thickening with associated irregular focal peribronchovascular nodular type opacities, most evident in the right lung base including the right middle  and lower lobes, with a small area noted in the medial left lower lobe. This is new since the prior exam. More focal opacity is noted the anterior base of the right middle lobe and along the medial margin of the right middle lobe, which may reflect atelectasis. No pleural effusion. Heart normal size. Hepatobiliary: Heterogeneous enhancement and attenuation of the liver.  There are focal areas of intrahepatic bile duct dilation in the right lobe. Ill-defined enhancing masses are evident. Thrombus fills the right, left and main portal veins. Several venous collaterals extend through the porta hepatis. Renal veins appear patent. The gallbladder is mildly distended. No convincing stones. No bile duct dilation. Pancreas: Unremarkable. No pancreatic ductal dilatation or surrounding inflammatory changes. Spleen: Mildly enlarged measuring 14.8 cm from superior inferior, previously 13.5 cm. No discrete spleen mass or focal lesion. Adrenals/Urinary Tract: Adrenal glands are unremarkable. Kidneys are normal, without renal calculi, focal lesion, or hydronephrosis. Bladder is unremarkable. Stomach/Bowel: No bowel dilation. No bowel wall thickening or inflammation. Appendix not visualized. Vascular/Lymphatic: Prominent and mildly enlarged gastrohepatic ligament lymph nodes, largest measuring 13 mm in short axis, similar to the prior CT. No other adenopathy. There is aortoiliac atherosclerotic disease with calcifications. Reproductive: Unremarkable Other: No abdominal wall hernia.  No ascites. Musculoskeletal: No osteoblastic or osteolytic lesions. No acute findings. IMPRESSION: 1. Findings consistent with multifocal liver carcinoma with extensive portal vein thrombosis, and subsequent development of porta hepatis venous collaterals. Thrombus in the central portal vein is no longer expansile, but still expands the more peripheral portal vein and the right and left portal veins. There are few right liver bile ducts there are now  dilated, which suggests mild progression of liver disease. 2. There is mild splenomegaly consistent or venous hypertension, which has mildly increased from the prior CT. 3. There are new lung abnormalities with interstitial thickening and ill-defined peribronchovascular nodular opacities mostly at the right lung base with a smaller area noted along the medial left lower lobe. The etiology may be infectious. It may be inflammatory, possibly related to chemotherapy. Neoplastic disease is also possible. 4. There is no other evidence of metastatic disease within the abdomen or pelvis. No acute findings within the abdomen or pelvis. Electronically Signed   By: Lajean Manes M.D.   On: 01/25/2016 12:39     ASSESSMENT & PLAN: 66 y.o. Caucasian male with past medical history of successfully treated hepatitis C, liver cirrhosis, history of ascites and encephalopathy, recurrent HCC  1. Recurrent HCC, initially stage II -I previously reviewed his previous image and outside medical records extensively, confirmed key findings with patient and his family members -He initially had a multifocal disease, status post TACE 3 times in 2015 -he now has developed symptomatic local recurrence, with a 5cm new lesion in segment 8 with direct invasion into portal vein, and a portocaval lymph node. He is not a candidate for liver targeted therapy per his IR Dr. Delice Lesch -he is currently on first line systemic therapy with sorafenib, not able to tolerate full dose, doing well on 600 mg daily, will continue  -His previous CT abdomen and pelvis on 01/25/2016 revealed extensive portal hypertension, and a probable cancer progression in the liver. -His treatment has changed 2 immunotherapy Nivolumab 2 weeks ago, he tolerated very well, and clinically he is doing much better, with less pain and better energy  -I discussed his restaging CT scan which was done last week, it actually showed decreased liver cancer size compared to the scan a  month ago. No other new metastasis. -He is clinically doing well, we'll continue Nivolumab every 2 weeks  2. Abdominal pain -Secondary to [facility] and portal vein thrombosis -overall better now with oxycodone and OxyContin, he is on very high dose, overall pain is controlled  -continue OxyContin 80 mg every 8 hours, and oxycodone '30mg'$  every 3-4 hours. I will refilled Oxycodone today. -I  recommended him to see pain management clinic, he declined.  3. Portal vein thrombosis -Worse on the previous CT scan. I think it probably contributes to his abdominal pain also -I recommend him to start anticoagulation. The options of Lovenox injection, Coumadin, and Xarelto were discussed with patient, he opted to Xarelto. He has started on 15 mg twice daily for 3 weeks, then '20mg'$  daily -We previously discussed the moderate to high risk of bleeding with Xarelto due to his liver cirrhosis and he knows to avoid injury and fall   4 Liver cirrhosis secondary to hepatitis C and alcohol, history of encephalopathy and ascites -He will continue follow-up with [facility], his ascites is well controlled with diuretics -He knows to use laxative, especially lactulose for his constipation  5. Hep C, successfully treated -Continue follow-up with liver clinic NP Dawn  6. Anxiety and insomnia  -He has Xanax for anxiety. I have refilled Xanax 1 mg today. -continue Ativan 1 mg at bedtime as needed for insomnia, he knows not to take with Xanax together, he knows not to take during the day. He does not feel Ativan is working well for sleep, he may take xanax for insomnia   7. Goal of care discussion, DNR/DNI  -We previously discussed the incurable nature of his cancer, and the overall poor prognosis, especially if he does not have good response to treatment -The patient understands the goal of care is palliative. -I recommend DNR/DNI, he agrees   8. HTN -He has stopped taking his BP medication. His BP today actually was not high,  135/79. If it is high again at our next visit, will consider starting BP medication again -He will monitor his BP at home. -He does not see a PCP to monitor this.   Plan -Continue immunotherapy treatment Nivolumab every 2 weeks -refill oxycontin.  -I'll plan to see him back in 4 weeks  All questions were answered. The patient knows to call the clinic with any problems, questions or concerns.  I spent 20 minutes counseling the patient face to face. The total time spent in the appointment was 30 minutes and more than 50% was on counseling.  This document serves as a record of services personally performed by Truitt Merle, MD. It was created on her behalf by Martinique Casey, a trained medical scribe. The creation of this record is based on the scribe's personal observations and the provider's statements to them. This document has been checked and approved by the attending provider.  I have reviewed the above documentation for accuracy and completeness and I agree with the above.   Truitt Merle, MD 02/18/2016

## 2016-02-14 ENCOUNTER — Ambulatory Visit (HOSPITAL_COMMUNITY)
Admission: RE | Admit: 2016-02-14 | Discharge: 2016-02-14 | Disposition: A | Discharge status: Home / Self Care | Payer: Medicare Other | Source: Ambulatory Visit | Attending: Hematology | Admitting: Hematology

## 2016-02-14 ENCOUNTER — Other Ambulatory Visit: Payer: Self-pay | Admitting: *Deleted

## 2016-02-14 ENCOUNTER — Encounter (HOSPITAL_COMMUNITY): Payer: Self-pay

## 2016-02-14 DIAGNOSIS — C220 Liver cell carcinoma: Secondary | ICD-10-CM | POA: Diagnosis not present

## 2016-02-14 DIAGNOSIS — I81 Portal vein thrombosis: Secondary | ICD-10-CM | POA: Diagnosis not present

## 2016-02-14 DIAGNOSIS — R918 Other nonspecific abnormal finding of lung field: Secondary | ICD-10-CM | POA: Diagnosis not present

## 2016-02-14 DIAGNOSIS — I8500 Esophageal varices without bleeding: Secondary | ICD-10-CM | POA: Diagnosis not present

## 2016-02-14 DIAGNOSIS — C3490 Malignant neoplasm of unspecified part of unspecified bronchus or lung: Secondary | ICD-10-CM | POA: Diagnosis not present

## 2016-02-14 MED ORDER — OXYCODONE HCL 30 MG PO TABS: 30.0000 mg | ORAL_TABLET | ORAL | 0 refills | Status: DC | PRN

## 2016-02-14 MED ORDER — IOPAMIDOL (ISOVUE-300) INJECTION 61%
INTRAVENOUS | Status: AC
  Administered 2016-02-14: 100 mL
  Filled 2016-02-14: qty 100

## 2016-02-14 MED FILL — oxyCODONE HCL 30 MG TABS: 30 | 40 days supply | Qty: 120 | Fill #0

## 2016-02-18 ENCOUNTER — Telehealth: Payer: Self-pay | Admitting: *Deleted

## 2016-02-18 ENCOUNTER — Ambulatory Visit (HOSPITAL_BASED_OUTPATIENT_CLINIC_OR_DEPARTMENT_OTHER): Payer: Medicare Other

## 2016-02-18 ENCOUNTER — Other Ambulatory Visit (HOSPITAL_BASED_OUTPATIENT_CLINIC_OR_DEPARTMENT_OTHER): Payer: Medicare Other

## 2016-02-18 ENCOUNTER — Encounter: Payer: Self-pay | Admitting: Hematology

## 2016-02-18 ENCOUNTER — Telehealth: Payer: Self-pay | Admitting: Hematology

## 2016-02-18 ENCOUNTER — Ambulatory Visit (HOSPITAL_BASED_OUTPATIENT_CLINIC_OR_DEPARTMENT_OTHER): Payer: Medicare Other | Admitting: Hematology

## 2016-02-18 VITALS — BP 135/79 | HR 83 | Temp 97.7°F | Resp 16 | Ht 73.0 in | Wt 155.7 lb

## 2016-02-18 DIAGNOSIS — G893 Neoplasm related pain (acute) (chronic): Secondary | ICD-10-CM

## 2016-02-18 DIAGNOSIS — Z79899 Other long term (current) drug therapy: Secondary | ICD-10-CM

## 2016-02-18 DIAGNOSIS — I81 Portal vein thrombosis: Secondary | ICD-10-CM | POA: Diagnosis not present

## 2016-02-18 DIAGNOSIS — C220 Liver cell carcinoma: Secondary | ICD-10-CM

## 2016-02-18 DIAGNOSIS — R109 Unspecified abdominal pain: Secondary | ICD-10-CM

## 2016-02-18 DIAGNOSIS — K7469 Other cirrhosis of liver: Secondary | ICD-10-CM

## 2016-02-18 DIAGNOSIS — Z8619 Personal history of other infectious and parasitic diseases: Secondary | ICD-10-CM

## 2016-02-18 DIAGNOSIS — Z5112 Encounter for antineoplastic immunotherapy: Secondary | ICD-10-CM | POA: Diagnosis present

## 2016-02-18 DIAGNOSIS — F419 Anxiety disorder, unspecified: Secondary | ICD-10-CM

## 2016-02-18 DIAGNOSIS — Z7901 Long term (current) use of anticoagulants: Secondary | ICD-10-CM | POA: Diagnosis not present

## 2016-02-18 DIAGNOSIS — G4700 Insomnia, unspecified: Secondary | ICD-10-CM | POA: Diagnosis not present

## 2016-02-18 LAB — TSH: TSH: 3.108 m[IU]/L (ref 0.320–4.118)

## 2016-02-18 LAB — CBC WITH DIFFERENTIAL/PLATELET
BASO%: 0.6 % (ref 0.0–2.0)
Basophils Absolute: 0 10*3/uL (ref 0.0–0.1)
EOS%: 2.2 % (ref 0.0–7.0)
Eosinophils Absolute: 0.2 10*3/uL (ref 0.0–0.5)
HEMATOCRIT: 33.3 % — AB (ref 38.4–49.9)
HEMOGLOBIN: 11.1 g/dL — AB (ref 13.0–17.1)
LYMPH#: 1.9 10*3/uL (ref 0.9–3.3)
LYMPH%: 21.7 % (ref 14.0–49.0)
MCH: 28.9 pg (ref 27.2–33.4)
MCHC: 33.3 g/dL (ref 32.0–36.0)
MCV: 86.6 fL (ref 79.3–98.0)
MONO#: 0.7 10*3/uL (ref 0.1–0.9)
MONO%: 8.6 % (ref 0.0–14.0)
NEUT%: 66.9 % (ref 39.0–75.0)
NEUTROS ABS: 5.8 10*3/uL (ref 1.5–6.5)
PLATELETS: 209 10*3/uL (ref 140–400)
RBC: 3.84 10*6/uL — ABNORMAL LOW (ref 4.20–5.82)
RDW: 15.1 % — ABNORMAL HIGH (ref 11.0–14.6)
WBC: 8.7 10*3/uL (ref 4.0–10.3)

## 2016-02-18 LAB — COMPREHENSIVE METABOLIC PANEL
ALBUMIN: 3.1 g/dL — AB (ref 3.5–5.0)
ALK PHOS: 280 U/L — AB (ref 40–150)
ALT: 39 U/L (ref 0–55)
ANION GAP: 10 meq/L (ref 3–11)
AST: 53 U/L — AB (ref 5–34)
BUN: 17.7 mg/dL (ref 7.0–26.0)
CALCIUM: 9.4 mg/dL (ref 8.4–10.4)
CHLORIDE: 105 meq/L (ref 98–109)
CO2: 22 mEq/L (ref 22–29)
Creatinine: 1.2 mg/dL (ref 0.7–1.3)
EGFR: 61 mL/min/{1.73_m2} — AB (ref >90)
Glucose: 133 mg/dl (ref 70–140)
POTASSIUM: 4.1 meq/L (ref 3.5–5.1)
Sodium: 137 mEq/L (ref 136–145)
Total Bilirubin: 0.52 mg/dL (ref 0.20–1.20)
Total Protein: 7.7 g/dL (ref 6.4–8.3)

## 2016-02-18 MED ORDER — SODIUM CHLORIDE 0.9 % IV SOLN
Freq: Once | INTRAVENOUS | Status: AC
  Administered 2016-02-18: 12:00:00 via INTRAVENOUS

## 2016-02-18 MED ORDER — OXYCODONE HCL ER 80 MG PO T12A: 80.0000 mg | EXTENDED_RELEASE_TABLET | Freq: Three times a day (TID) | ORAL | 0 refills | Status: DC

## 2016-02-18 MED ORDER — ALPRAZOLAM 1 MG PO TABS: 1.0000 mg | ORAL_TABLET | Freq: Three times a day (TID) | ORAL | 0 refills | Status: DC | PRN

## 2016-02-18 MED ORDER — SODIUM CHLORIDE 0.9 % IV SOLN
240.0000 mg | Freq: Once | INTRAVENOUS | Status: AC
  Administered 2016-02-18: 240 mg via INTRAVENOUS
  Filled 2016-02-18: qty 20

## 2016-02-18 MED ORDER — SODIUM CHLORIDE 0.9% FLUSH
10.0000 mL | INTRAVENOUS | Status: DC | PRN
  Filled 2016-02-18: qty 10

## 2016-02-18 MED FILL — ALPRAZolam 1 MG TABS: 1 | 30 days supply | Qty: 90 | Fill #0

## 2016-02-18 MED FILL — OxyCONTIN 80 MG T12A: 80 | 30 days supply | Qty: 90 | Fill #0

## 2016-02-18 NOTE — Telephone Encounter (Signed)
Per 1/30 LOS and staff message I have scheduled appts and notified the scheduler

## 2016-02-18 NOTE — Telephone Encounter (Signed)
Message sent to Infusion scheduler to be added. Appointments scheduled per 02/18/16 los. Patient was given a copy of the appointment schedule and AVS report per 0/30/18 los.

## 2016-02-18 NOTE — Patient Instructions (Signed)
[facility] Discharge Instructions for Patients Receiving Chemotherapy  Today you received the following chemotherapy agents: Nivolumab   To help prevent nausea and vomiting after your treatment, we encourage you to take your nausea medication as directed.    If you develop nausea and vomiting that is not controlled by your nausea medication, call the clinic.   BELOW ARE SYMPTOMS THAT SHOULD BE REPORTED IMMEDIATELY:  *FEVER GREATER THAN 100.5 F  *CHILLS WITH OR WITHOUT FEVER  NAUSEA AND VOMITING THAT IS NOT CONTROLLED WITH YOUR NAUSEA MEDICATION  *UNUSUAL SHORTNESS OF BREATH  *UNUSUAL BRUISING OR BLEEDING  TENDERNESS IN MOUTH AND THROAT WITH OR WITHOUT PRESENCE OF ULCERS  *URINARY PROBLEMS  *BOWEL PROBLEMS  UNUSUAL RASH Items with * indicate a potential emergency and should be followed up as soon as possible.  Feel free to call the clinic you have any questions or concerns. The clinic phone number is [phone].  Please show the [facility] at check-in to the Emergency Department and triage nurse.   Nivolumab injection What is this medicine? NIVOLUMAB (nye VOL ue mab) is a monoclonal antibody. It is used to treat melanoma, lung cancer, kidney cancer, head and neck cancer, Hodgkin lymphoma, and urothelial cancer. COMMON BRAND NAME(S): Opdivo What should I tell my health care provider before I take this medicine? They need to know if you have any of these conditions: -diabetes -immune system problems -kidney disease -liver disease -lung disease -organ transplant -stomach or intestine problems -thyroid disease -an unusual or allergic reaction to nivolumab, other medicines, foods, dyes, or preservatives -pregnant or trying to get pregnant -breast-feeding How should I use this medicine? This medicine is for infusion into a vein. It is given by a health care professional in a hospital or clinic setting. A special MedGuide will be given to you  before each treatment. Be sure to read this information carefully each time. Talk to your pediatrician regarding the use of this medicine in children. Special care may be needed. What if I miss a dose? It is important not to miss your dose. Call your doctor or health care professional if you are unable to keep an appointment. What may interact with this medicine? Interactions have not been studied. Give your health care provider a list of all the medicines, herbs, non-prescription drugs, or dietary supplements you use. Also tell them if you smoke, drink alcohol, or use illegal drugs. Some items may interact with your medicine. What should I watch for while using this medicine? This drug may make you feel generally unwell. Continue your course of treatment even though you feel ill unless your doctor tells you to stop. You may need blood work done while you are taking this medicine. Do not become pregnant while taking this medicine or for 5 months after stopping it. Women should inform their doctor if they wish to become pregnant or think they might be pregnant. There is a potential for serious side effects to an unborn child. Talk to your health care professional or pharmacist for more information. Do not breast-feed an infant while taking this medicine. What side effects may I notice from receiving this medicine? Side effects that you should report to your doctor or health care professional as soon as possible: -allergic reactions like skin rash, itching or hives, swelling of the face, lips, or tongue -black, tarry stools -blood in the urine -bloody or watery diarrhea -changes in vision -change in sex drive -changes in emotions or moods -chest pain -  confusion -cough -decreased appetite -diarrhea -facial flushing -feeling faint or lightheaded -fever, chills -hair loss -hallucination, loss of contact with reality -headache -irritable -joint pain -loss of memory -muscle pain -muscle  weakness -seizures -shortness of breath -signs and symptoms of high blood sugar such as dizziness; dry mouth; dry skin; fruity breath; nausea; stomach pain; increased hunger or thirst; increased urination -signs and symptoms of kidney injury like trouble passing urine or change in the amount of urine -signs and symptoms of liver injury like dark yellow or brown urine; general ill feeling or flu-like symptoms; light-colored stools; loss of appetite; nausea; right upper belly pain; unusually weak or tired; yellowing of the eyes or skin -stiff neck -swelling of the ankles, feet, hands -weight gain Side effects that usually do not require medical attention (report to your doctor or health care professional if they continue or are bothersome): -bone pain -constipation -tiredness -vomiting Where should I keep my medicine? This drug is given in a hospital or clinic and will not be stored at home.  2017 Elsevier/Gold Standard (2015-02-22 09:04:36)

## 2016-02-19 ENCOUNTER — Other Ambulatory Visit: Payer: Medicare Other

## 2016-02-19 LAB — AFP TUMOR MARKER: AFP, SERUM, TUMOR MARKER: 7.1 ng/mL (ref 0.0–8.3)

## 2016-02-20 ENCOUNTER — Ambulatory Visit: Payer: Medicare Other | Admitting: Hematology

## 2016-02-24 ENCOUNTER — Telehealth: Payer: Self-pay | Admitting: *Deleted

## 2016-02-24 NOTE — Telephone Encounter (Signed)
Spoke with pt and confirmed that pt is taking Xarelto as prescribed.  Instructed pt to continue elevate Left leg, and foot with sitting and lying down.  Pt understood to call office back if symptoms worsened.

## 2016-02-24 NOTE — Telephone Encounter (Signed)
Pt called requesting a call back from nurse.  Spoke with pt and was informed that pt developed Left foot and ankle swelling yesterday.  Denied pain, denied redness, just swelling.  Stated ambulating fine. Per pt, he had Left knee surgery and had swelling from Left knee occasional.  Pt applied ice to Left foot - had some relief with the swelling.  Stated he elevated Left leg with sitting down.  Pt wanted to know what Dr. Burr Medico would recommend. Pt's   Phone    [phone].

## 2016-02-24 NOTE — Telephone Encounter (Signed)
Dr. Burr Medico notifed.  Attempted to call pt back with no answers.  Left message on voice mail requesting a call back to collaborative nurse.

## 2016-02-28 ENCOUNTER — Telehealth: Payer: Self-pay | Admitting: *Deleted

## 2016-02-28 NOTE — Telephone Encounter (Signed)
Call from [facility] reporting patient had a rough night, vomited several times. No emesis today. She reports he feels warm to the touch. Forehead thermometer read 98.9. He is tolerating fluids this morning. Denies constipation or diarrhea. Instructed her to have him use Compazine as prescribed,  call office if he is unable to tolerate POs. She voiced understanding. Message to MD for review/ further instructions.

## 2016-03-02 ENCOUNTER — Telehealth: Payer: Self-pay | Admitting: *Deleted

## 2016-03-02 DIAGNOSIS — C220 Liver cell carcinoma: Secondary | ICD-10-CM

## 2016-03-02 MED ORDER — RIVAROXABAN 20 MG PO TABS: 20.0000 mg | ORAL_TABLET | Freq: Every day | ORAL | 2 refills | Status: DC

## 2016-03-02 MED ORDER — OXYCODONE HCL 30 MG PO TABS: 30.0000 mg | ORAL_TABLET | ORAL | 0 refills | Status: DC | PRN

## 2016-03-02 MED FILL — XARELTO 20 MG TABLET: 20 | 30 days supply | Qty: 30 | Fill #0

## 2016-03-02 MED FILL — oxyCODONE HCL 30 MG TABS: 30 | 15 days supply | Qty: 120 | Fill #0

## 2016-03-02 NOTE — Telephone Encounter (Signed)
Received call from wife Kevin Dillon requesting refills of Oxycodone and Xarelto.  Oxycodone script signed by Dr. Benay Spice.

## 2016-03-03 ENCOUNTER — Telehealth: Payer: Self-pay | Admitting: Hematology

## 2016-03-03 ENCOUNTER — Other Ambulatory Visit: Payer: Medicare Other

## 2016-03-03 ENCOUNTER — Ambulatory Visit: Payer: Medicare Other

## 2016-03-03 NOTE — Telephone Encounter (Signed)
Patient needs to cancel and reschedule his lab and infusion due to sickness

## 2016-03-04 ENCOUNTER — Telehealth: Payer: Self-pay | Admitting: *Deleted

## 2016-03-04 NOTE — Telephone Encounter (Signed)
Per 2/13 LOS I have scheduled appts. I have called and gave the patient's family member date/time

## 2016-03-05 ENCOUNTER — Ambulatory Visit (HOSPITAL_BASED_OUTPATIENT_CLINIC_OR_DEPARTMENT_OTHER): Payer: Medicare Other

## 2016-03-05 ENCOUNTER — Other Ambulatory Visit (HOSPITAL_BASED_OUTPATIENT_CLINIC_OR_DEPARTMENT_OTHER): Payer: Medicare Other

## 2016-03-05 VITALS — BP 147/63 | HR 83 | Temp 98.9°F | Resp 16

## 2016-03-05 DIAGNOSIS — C220 Liver cell carcinoma: Secondary | ICD-10-CM | POA: Diagnosis present

## 2016-03-05 DIAGNOSIS — Z5112 Encounter for antineoplastic immunotherapy: Secondary | ICD-10-CM | POA: Diagnosis present

## 2016-03-05 LAB — CBC WITH DIFFERENTIAL/PLATELET
BASO%: 0.7 % (ref 0.0–2.0)
Basophils Absolute: 0.1 10*3/uL (ref 0.0–0.1)
EOS ABS: 0.2 10*3/uL (ref 0.0–0.5)
EOS%: 1.9 % (ref 0.0–7.0)
HCT: 33.9 % — ABNORMAL LOW (ref 38.4–49.9)
HEMOGLOBIN: 11.3 g/dL — AB (ref 13.0–17.1)
LYMPH%: 26.8 % (ref 14.0–49.0)
MCH: 29.2 pg (ref 27.2–33.4)
MCHC: 33.3 g/dL (ref 32.0–36.0)
MCV: 87.7 fL (ref 79.3–98.0)
MONO#: 0.8 10*3/uL (ref 0.1–0.9)
MONO%: 9.5 % (ref 0.0–14.0)
NEUT%: 61.1 % (ref 39.0–75.0)
NEUTROS ABS: 5.2 10*3/uL (ref 1.5–6.5)
PLATELETS: 247 10*3/uL (ref 140–400)
RBC: 3.86 10*6/uL — AB (ref 4.20–5.82)
RDW: 17.3 % — AB (ref 11.0–14.6)
WBC: 8.4 10*3/uL (ref 4.0–10.3)
lymph#: 2.3 10*3/uL (ref 0.9–3.3)

## 2016-03-05 LAB — COMPREHENSIVE METABOLIC PANEL
ALBUMIN: 3.3 g/dL — AB (ref 3.5–5.0)
ALK PHOS: 317 U/L — AB (ref 40–150)
ALT: 44 U/L (ref 0–55)
ANION GAP: 11 meq/L (ref 3–11)
AST: 50 U/L — ABNORMAL HIGH (ref 5–34)
BILIRUBIN TOTAL: 0.83 mg/dL (ref 0.20–1.20)
BUN: 17.6 mg/dL (ref 7.0–26.0)
CO2: 21 meq/L — AB (ref 22–29)
CREATININE: 1.2 mg/dL (ref 0.7–1.3)
Calcium: 9.5 mg/dL (ref 8.4–10.4)
Chloride: 108 mEq/L (ref 98–109)
EGFR: 66 mL/min/{1.73_m2} — AB (ref >90)
GLUCOSE: 114 mg/dL (ref 70–140)
Potassium: 3.7 mEq/L (ref 3.5–5.1)
SODIUM: 140 meq/L (ref 136–145)
TOTAL PROTEIN: 8.2 g/dL (ref 6.4–8.3)

## 2016-03-05 MED ORDER — SODIUM CHLORIDE 0.9 % IV SOLN
Freq: Once | INTRAVENOUS | Status: AC
  Administered 2016-03-05: 16:00:00 via INTRAVENOUS

## 2016-03-05 MED ORDER — SODIUM CHLORIDE 0.9 % IV SOLN
240.0000 mg | Freq: Once | INTRAVENOUS | Status: AC
  Administered 2016-03-05: 240 mg via INTRAVENOUS
  Filled 2016-03-05: qty 20

## 2016-03-05 NOTE — Patient Instructions (Signed)
[facility] Discharge Instructions for Patients Receiving Chemotherapy  Today you received the following chemotherapy agents:  Nivolumab (Opdivo)  To help prevent nausea and vomiting after your treatment, we encourage you to take your nausea medication as prescribed.   If you develop nausea and vomiting that is not controlled by your nausea medication, call the clinic.   BELOW ARE SYMPTOMS THAT SHOULD BE REPORTED IMMEDIATELY:  *FEVER GREATER THAN 100.5 F  *CHILLS WITH OR WITHOUT FEVER  NAUSEA AND VOMITING THAT IS NOT CONTROLLED WITH YOUR NAUSEA MEDICATION  *UNUSUAL SHORTNESS OF BREATH  *UNUSUAL BRUISING OR BLEEDING  TENDERNESS IN MOUTH AND THROAT WITH OR WITHOUT PRESENCE OF ULCERS  *URINARY PROBLEMS  *BOWEL PROBLEMS  UNUSUAL RASH Items with * indicate a potential emergency and should be followed up as soon as possible.  Feel free to call the clinic you have any questions or concerns. The clinic phone number is [phone].  Please show the [facility] at check-in to the Emergency Department and triage nurse.

## 2016-03-16 NOTE — Progress Notes (Signed)
[facility]  [phone] [phone]  Clinic Follow up Note   Patient Care Team: Daneil Dolin, MD as Attending Physician (Gastroenterology) Antonietta Jewel, MD as Referring Physician (Internal Medicine) Truitt Merle, MD as Consulting Physician (Hematology) [name], CRNP as Nurse Practitioner (Nurse Practitioner) 03/17/2016   CHIEF COMPLAINTS:  Follow up recurrent [place]   Oncology History   Hepatocellular carcinoma ([place])   Staging form: Liver (Excluding Intrahepatic Bile Ducts), AJCC 7th Edition   - Clinical stage from 04/16/2012: Stage II (T2(m), N0, M0) - Signed by Truitt Merle, MD on 10/12/2015   - Pathologic stage from 04/16/2013: Stage II (T2, N0, cM0) - Signed by Truitt Merle, MD on 10/12/2015      Hepatocellular carcinoma ([place])   09/14/2012 Tumor Marker    AFP 28.9      04/16/2013 Imaging    Abdominal MRI with and without contrast reviewed multifocal (4) liver lesions in both left and right lobes, most consistent with [place], largest measuring 2.7 cm      04/16/2013 Initial Diagnosis    Hepatocellular carcinoma ([place])      04/28/2013 Procedure    Right TACE with lipiodol       06/15/2013 Procedure    Left TACE with [facility]       07/14/2013 Procedure    Right TACE with [facility]      09/05/2015 Imaging    CT abdomen with and without contrast showed a new 5.0 x 2.3 cm mass in the hepatic segment 8, invading and occluding the right anterior portal vein, most compatible with [place]. A portacaval node measuring 3.0 x 1.4 cm, previously 2.9 x 1.1 cm.      10/11/2015 Tumor Marker    AFP 5.3      11/11/2015 - 02/03/2016 Chemotherapy    sorafenib '200mg'$  bid started on 11/11/2015, increased to '400mg'$  bid in 2 weeks, stopped due to poor tolerance and probable disease progression       11/14/2015 Imaging    CT CAP 11/14/2015 IMPRESSION: Chest Impression: 1. RIGHT upper lobe pulmonary nodule is indeterminate. No comparison available. 2. Ground-glass opacity and  peripheral nodules in the RIGHT middle lobe appear post infectious or inflammatory.  Abdomen / Pelvis Impression: 1. 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 Thrombus also likely within the LEFT and RIGHT portal vein. Difficult to distinguish tumor thrombus versus bland thrombus. 2. Individual lesions are difficult to define in the RIGHT hepatic lobe. There is overall impression of progression of disease in the RIGHT hepatic lobe with multiple ill-defined enhancing lesions. 3. Periportal and shotty retroperitoneal adenopathy similar to prior.      01/25/2016 Imaging    CT Abdomen Pelvis w/ Contrast IMPRESSION: 1. Findings consistent with multifocal liver carcinoma with extensive portal vein thrombosis, and subsequent development of porta hepatis venous collaterals. Thrombus in the central portal vein is no longer expansile, but still expands the more peripheral portal vein and the right and left portal veins. There are few right liver bile ducts there are now dilated, which suggests mild progression of liver disease. 2. There is mild splenomegaly consistent or venous hypertension, which has mildly increased from the prior CT. 3. There are new lung abnormalities with interstitial thickening and ill-defined peribronchovascular nodular opacities mostly at the right lung base with a smaller area noted along the medial left lower lobe. The etiology may be infectious. It may be inflammatory, possibly  related to chemotherapy. Neoplastic disease is also possible. 4. There is no other evidence of metastatic disease within the abdomen or pelvis. No acute findings within the abdomen or pelvis      01/25/2016 - 01/25/2016 Hospital Admission    Pt was seen in ED for worsening abdominal pain, treated with pain meds       02/04/2016 -  Chemotherapy    The patient started on immunotherapy treatment  nivolumab every 2 weeks       02/14/2016 Imaging    CT CAP w/ contrast 1. Slight interval increase in size of the mediastinal lymph nodes. 2. Persistent extensive lower lobe peribronchial thickening and patchy infiltrates. Possible aspiration pneumonia. 3. Persistent tree-in-bud appearance in the right middle lobe, likely chronic inflammation or atypical infection. 4. Improved CT appearance of the liver. Two small residual arterial phase enhancing lesions are identified. 5. Chronic portal vein thrombosis with cavernous transformation of portal venous collaterals. Esophageal varices are again noted. 6. Stable upper abdominal lymph nodes likely related to cirrhosis.        HISTORY OF PRESENTING ILLNESS (10/11/2015):  Kevin Dillon 66 y.o. male with past medical history of treated hepatitis C, liver cirrhosis, and hepatocellular carcinoma is here because of recurrent hepatocellular carcinoma. He is accompanied by his significant other [name] and her sister.  He was diagnosed with hepatocellular carcinoma in 2015, his initial image result is not available and staging is unknown. He was seen by interventional radiologist Dr. Delice Lesch at [facility] in [place] and underwent TACE procedure 3 times in 2015. He was subsequently followed, and recently repeated CT scan showed a new 5.0 cm mass in segment 8, with portal vein invasion. He was felt not to be a candidate for further liver targeted therapy, and was referred to [place] for further systemic therapy.  He complains about mid epigastric pain for the past 2 years, which significantly improved after his TACE procedure in 2015. It has been getting worse lately in the past 6 months. He states he gets about 7-8 out of 10, persistent, he has been taking oxycodone 20 mg every 6 hours, but his pain is not well controlled. He is quite fatigued, is low appetite. He last 40 lbs in the pat 4 months. He is able to function at home, in trying to remain to be  physically active.  His hepatitis C was successfully treated. He has history of hepatitis C, complicated with ascites and encephalopathy, but it has been well controlled with medical management.   CURRENT THERAPY: Nivolumab '240mg'$  every 2 weeks, started on 02/04/2015  INTERIM HISTORY: [place] returns for follow up. He has been doing well. His left ankle was very swollen a couple of weeks ago. He would ice it, it would help, but then swell again. He was told that it is not the gout. He has chronic left knee pain from arthritis. The pain in his abdomen is still there, but is very minimal with pain medication. He is taking about 6 pain pills a day. Denies any other concerns.   MEDICAL HISTORY:  Past Medical History:  Diagnosis Date  . Cancer ([place])    liver  . Cholelithiasis   . Chronic hepatitis C ([place])   . Chronic knee pain   . Chronic left shoulder pain   . CVA (cerebral infarction)   . Gout   . Hepatitis C    genotype 1b.  pt has been vaccinated against hep A and B.  . Hypertension   .  Osteoarthritis   . Schatzki's ring   . Tubular adenoma of colon 12/2011    SURGICAL HISTORY: Past Surgical History:  Procedure Laterality Date  . AMPUTATION Left 09/17/2012   Procedure: SMALL FINGER EXTENSOR TENDON REPAIR; METACARPAL LEVEL AMPUTATION RING FINGER; PROXIMAL PHALANX LEVEL AMPUTATION LONG FINGER;  Surgeon: Schuyler Amor, MD;  Location: [facility];  Service: Orthopedics;  Laterality: Left;  . Arm surgery     right/plate in arm  . COLONOSCOPY WITH ESOPHAGOGASTRODUODENOSCOPY (EGD)  12/30/2011   RMR: Noncritical Schatzki's ring;  Hiatal hernia, Tubular ADENOMA removed from splenic flexure, otherwise normal colonoscopy  . LEG SURGERY     left  . WOUND EXPLORATION Left 09/17/2012   Procedure: WOUND EXPLORATION;  Surgeon: Schuyler Amor, MD;  Location: [facility];  Service: Orthopedics;  Laterality: Left;    SOCIAL HISTORY: Social History   Social History  . Marital status: Single     Spouse name: N/A  . Number of children: 0  . Years of education: N/A   Occupational History  . disabled    Social History Main Topics  . Smoking status: Former Smoker    Packs/day: 0.50    Years: 40.00    Types: Cigarettes    Quit date: 01/19/2012  . Smokeless tobacco: Never Used     Comment: Smokes 1/2 pack of cigarettes daily  . Alcohol use No     Comment: HX 2 beers 3-4 days per week; QUIT DEC 2013  . Drug use: No     Comment: Hx cocaine yrs ago, Last marijuana OCT 2013  . Sexual activity: Not Currently    Partners: Female   Other Topics Concern  . Not on file   Social History Narrative   Lives w/ significant other, [name]    FAMILY HISTORY: Family History  Problem Relation Age of Onset  . Cirrhosis Father 1  . Cancer Father     liver cancer   . Lung cancer Mother 39  . Colon cancer Neg Hx     ALLERGIES:  has No Known Allergies.  MEDICATIONS:  Current Outpatient Prescriptions  Medication Sig Dispense Refill  . ALPRAZolam (XANAX) 1 MG tablet Take 1 tablet (1 mg total) by mouth 3 (three) times daily as needed for anxiety. 90 tablet 0  . docusate sodium (COLACE) 100 MG capsule Take 1 capsule (100 mg total) by mouth 2 (two) times daily as needed. 60 capsule 5  . hydrALAZINE (APRESOLINE) 25 MG tablet Take 50 mg by mouth daily.     Marland Kitchen lactulose (CHRONULAC) 10 GM/15ML solution 30 g daily as needed for moderate constipation.     Marland Kitchen LORazepam (ATIVAN) 1 MG tablet Take 1 tablet (1 mg total) by mouth at bedtime. 30 tablet 0  . megestrol (MEGACE) 40 MG/ML suspension Take 400 mg by mouth daily.     . Multiple Vitamins-Minerals (CENTRUM SILVER PO) Take 1 tablet by mouth daily.     Marland Kitchen oxyCODONE (OXYCONTIN) 80 mg 12 hr tablet Take 1 tablet (80 mg total) by mouth every 8 (eight) hours. 90 tablet 0  . oxycodone (ROXICODONE) 30 MG immediate release tablet Take 1 tablet (30 mg total) by mouth every 4 (four) hours as needed for pain. 120 tablet 0  . PARoxetine (PAXIL) 20 MG  tablet Take 20 mg by mouth every morning.     . polyethylene glycol (MIRALAX / GLYCOLAX) packet Take 17 g by mouth daily as needed (constipation). 30 each 11  . prochlorperazine (COMPAZINE) 10 MG tablet  Take 1 tablet (10 mg total) by mouth every 8 (eight) hours as needed for nausea or vomiting. 30 tablet 1  . rivaroxaban (XARELTO) 20 MG TABS tablet Take 1 tablet (20 mg total) by mouth daily with supper. 30 tablet 2   No current facility-administered medications for this visit.     REVIEW OF SYSTEMS:   Constitutional: Denies fevers, chills or abnormal night sweats Eyes: Denies blurriness of vision, double vision or watery eyes Ears, nose, mouth, throat, and face: Denies mucositis or sore throat Respiratory: Denies cough, dyspnea or wheezes Cardiovascular: Denies palpitation, chest discomfort or lower extremity swelling Gastrointestinal:  Denies nausea, heartburn or change in bowel habits, (+) abdominal pain  Skin: Denies abnormal skin rashes Lymphatics: Denies new lymphadenopathy or easy bruising Neurological:Denies numbness, tingling or new weaknesses Behavioral/Psych: Mood is stable, no new changes Musculoskeletal: (+) L ankle swelling (+) L knee pain All other systems were reviewed with the patient and are negative.  PHYSICAL EXAMINATION: ECOG PERFORMANCE STATUS: 2  Vitals:   03/17/16 1033  BP: (!) 162/78  Pulse: 88  Resp: 18  Temp: 99 F (37.2 C)   Filed Weights   03/17/16 1033  Weight: 168 lb 3.2 oz (76.3 kg)    GENERAL:alert,  SKIN: skin color, texture, turgor are normal, no rashes or significant lesions EYES: normal, conjunctiva are pink and non-injected, sclera clear OROPHARYNX:no exudate, no erythema and lips, buccal mucosa, and tongue normal  NECK: supple, thyroid normal size, non-tender, without nodularity LYMPH:  no palpable lymphadenopathy in the cervical, axillary or inguinal LUNGS: clear to auscultation and percussion with normal breathing effort HEART:  regular rate & rhythm and no murmurs and no lower extremity edema ABDOMEN:abdomen soft, non-tender and normal bowel sounds, (+) liver is enlarged, palpable 4-5cm below rib cage, mild tenderness, no significant ascites (+) abdominal bloating  Musculoskeletal:no cyanosis of digits and no clubbing  PSYCH: alert & oriented x 3 with fluent speech NEURO: no focal motor/sensory deficits  LABORATORY DATA:  I have reviewed the data as listed CBC Latest Ref Rng & Units 03/17/2016 03/05/2016 02/18/2016  WBC 4.0 - 10.3 10e3/uL 7.8 8.4 8.7  Hemoglobin 13.0 - 17.1 g/dL 10.9(L) 11.3(L) 11.1(L)  Hematocrit 38.4 - 49.9 % 32.2(L) 33.9(L) 33.3(L)  Platelets 140 - 400 10e3/uL 192 247 209   CMP Latest Ref Rng & Units 03/17/2016 03/05/2016 02/18/2016  Glucose 70 - 140 mg/dl 85 114 133  BUN 7.0 - 26.0 mg/dL 14.0 17.6 17.7  Creatinine 0.7 - 1.3 mg/dL 1.1 1.2 1.2  Sodium 136 - 145 mEq/L 137 140 137  Potassium 3.5 - 5.1 mEq/L 4.0 3.7 4.1  Chloride 101 - 111 mmol/L - - -  CO2 22 - 29 mEq/L 22 21(L) 22  Calcium 8.4 - 10.4 mg/dL 9.4 9.5 9.4  Total Protein 6.4 - 8.3 g/dL 7.7 8.2 7.7  Total Bilirubin 0.20 - 1.20 mg/dL 0.48 0.83 0.52  Alkaline Phos 40 - 150 U/L 226(H) 317(H) 280(H)  AST 5 - 34 U/L 49(H) 50(H) 53(H)  ALT 0 - 55 U/L 39 44 39   AFP: 09/14/2012: 28.9 10/11/2015: 5.3 11/01/2015: 3.7 12/02/2015: 3.7 01/22/2016: 3.4 02/18/2016: 7.1  RADIOGRAPHIC STUDIES: I have personally reviewed the radiological images as listed and agreed with the findings in the report.  CT CAP w/ contrast 02/14/2016 IMPRESSION: 1. Slight interval increase in size of the mediastinal lymph nodes. 2. Persistent extensive lower lobe peribronchial thickening and patchy infiltrates. Possible aspiration pneumonia. 3. Persistent tree-in-bud appearance in the right middle lobe, likely  chronic inflammation or atypical infection. 4. Improved CT appearance of the liver. Two small residual arterial phase enhancing lesions are identified. 5.  Chronic portal vein thrombosis with cavernous transformation of portal venous collaterals. Esophageal varices are again noted. 6. Stable upper abdominal lymph nodes likely related to cirrhosis.  CT abdomen pelvis w/ contrast 01/25/2016 IMPRESSION: 1. Findings consistent with multifocal liver carcinoma with extensive portal vein thrombosis, and subsequent development of porta hepatis venous collaterals. Thrombus in the central portal vein is no longer expansile, but still expands the more peripheral portal vein and the right and left portal veins. There are few right liver bile ducts there are now dilated, which suggests mild progression of liver disease. 2. There is mild splenomegaly consistent or venous hypertension, which has mildly increased from the prior CT. 3. There are new lung abnormalities with interstitial thickening and ill-defined peribronchovascular nodular opacities mostly at the right lung base with a smaller area noted along the medial left lower lobe. The etiology may be infectious. It may be inflammatory, possibly related to chemotherapy. Neoplastic disease is also possible. 4. There is no other evidence of metastatic disease within the abdomen or pelvis. No acute findings within the abdomen or pelvis.  ASSESSMENT & PLAN: 66 y.o. Caucasian male with past medical history of successfully treated hepatitis C, liver cirrhosis, history of ascites and encephalopathy, recurrent HCC  1. Recurrent HCC, initially stage II -I previously reviewed his previous image and outside medical records extensively, confirmed key findings with patient and his family members -He initially had a multifocal disease, status post TACE 3 times in 2015 -he now has developed symptomatic local recurrence, with a 5cm new lesion in segment 8 with direct invasion into portal vein, and a portocaval lymph node. He is not a candidate for liver targeted therapy per his IR Dr. Delice Lesch -he is currently on first  line systemic therapy with sorafenib, not able to tolerate full dose, doing well on 600 mg daily, will continue  -His previous CT abdomen and pelvis on 01/25/2016 revealed extensive portal hypertension, and a probable cancer progression in the liver. -His treatment has changed to immunotherapy Nivolumab, he tolerated very well, and clinically he is doing much better, with less pain and better energy. Continue.  -I previously discussed his restaging CT scan which was done on 02/14/2016, it actually showed decreased liver cancer size compared to the scan a month ago. No other new metastasis. -He is clinically doing well, we'll continue Nivolumab every 2 weeks -Order repeat scans for end of March.  -I recommended a port placement, he declined   2. Abdominal pain -Secondary to [facility] and portal vein thrombosis -overall better now with oxycodone and OxyContin, he is on very high dose, overall pain is controlled  -continue OxyContin 80 mg every 8 hours, and oxycodone '30mg'$  every 3-4 hours. I will refilled Oxycodone today. -I recommended him to see pain management clinic, he declined. -he is taking oxycodone 6 pain pills a day. I have encouraged him to continue the OxyContin, but to only take the oxycodone PRN.   3. Portal vein thrombosis -Worse on the previous CT scan. I think it probably contributes to his abdominal pain also -I recommend him to start anticoagulation. The options of Lovenox injection, Coumadin, and Xarelto were discussed with patient, he opted to Xarelto. 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 Hep C, successfully treated -Continue follow-up with liver clinic NP [facility]  6. Anxiety and insomnia  -He has Xanax for anxiety. I have refilled Xanax 1 mg today. -continue Ativan 1 mg at bedtime as needed for insomnia, he knows not to take with Xanax together, he knows not to take during the day. He does not feel Ativan is working well for sleep, he may take xanax for insomnia   7. Goal of care discussion, DNR/DNI  -We previously discussed the incurable nature of his cancer, and the overall poor prognosis, especially if he does not have good response to treatment -The patient understands the goal of care is palliative. -I recommend DNR/DNI, he agrees   8. HTN -He has stopped taking his BP medication. His BP today was high, 162/78. -He will monitor his BP at home. -He does not see a PCP to monitor this.   Plan -Continue immunotherapy treatment Nivolumab every 2 weeks (March 15, March 29) -Refill oxycodone, OxyContin, and Xanax  -Change infusion from today to Thursday or Friday this week per pt's request.  -Order repeat scan for end of March before f/u  -I'll plan to see him back on March 29.    All questions were answered. The patient knows to call the clinic with any problems, questions or concerns.  I spent 20 minutes counseling the patient face to face. The total time spent in the appointment was 30 minutes and more than 50% was on counseling.  This document serves as a record of services personally performed by Truitt Merle, MD. It was created on her behalf by Martinique Casey, a trained medical scribe. The creation of this record is based on the scribe's personal observations and the provider's statements to them. This document has been checked and approved by the attending provider.  I have reviewed the above documentation for accuracy and completeness and I agree with the above.   Truitt Merle, MD 03/17/2016

## 2016-03-17 ENCOUNTER — Other Ambulatory Visit (HOSPITAL_BASED_OUTPATIENT_CLINIC_OR_DEPARTMENT_OTHER): Payer: Medicare Other

## 2016-03-17 ENCOUNTER — Ambulatory Visit (HOSPITAL_BASED_OUTPATIENT_CLINIC_OR_DEPARTMENT_OTHER): Payer: Medicare Other | Admitting: Hematology

## 2016-03-17 ENCOUNTER — Telehealth: Payer: Self-pay | Admitting: Hematology

## 2016-03-17 ENCOUNTER — Encounter: Payer: Self-pay | Admitting: Hematology

## 2016-03-17 ENCOUNTER — Ambulatory Visit: Payer: Medicare Other

## 2016-03-17 VITALS — BP 162/78 | HR 88 | Temp 99.0°F | Resp 18 | Ht 73.0 in | Wt 168.2 lb

## 2016-03-17 DIAGNOSIS — K7469 Other cirrhosis of liver: Secondary | ICD-10-CM

## 2016-03-17 DIAGNOSIS — Z8619 Personal history of other infectious and parasitic diseases: Secondary | ICD-10-CM | POA: Diagnosis not present

## 2016-03-17 DIAGNOSIS — R109 Unspecified abdominal pain: Secondary | ICD-10-CM | POA: Diagnosis not present

## 2016-03-17 DIAGNOSIS — G4700 Insomnia, unspecified: Secondary | ICD-10-CM

## 2016-03-17 DIAGNOSIS — G893 Neoplasm related pain (acute) (chronic): Secondary | ICD-10-CM | POA: Diagnosis not present

## 2016-03-17 DIAGNOSIS — C220 Liver cell carcinoma: Secondary | ICD-10-CM | POA: Diagnosis not present

## 2016-03-17 DIAGNOSIS — I81 Portal vein thrombosis: Secondary | ICD-10-CM

## 2016-03-17 DIAGNOSIS — F419 Anxiety disorder, unspecified: Secondary | ICD-10-CM

## 2016-03-17 LAB — CBC WITH DIFFERENTIAL/PLATELET
BASO%: 0.5 % (ref 0.0–2.0)
Basophils Absolute: 0 10*3/uL (ref 0.0–0.1)
EOS%: 1.5 % (ref 0.0–7.0)
Eosinophils Absolute: 0.1 10*3/uL (ref 0.0–0.5)
HEMATOCRIT: 32.2 % — AB (ref 38.4–49.9)
HEMOGLOBIN: 10.9 g/dL — AB (ref 13.0–17.1)
LYMPH#: 2 10*3/uL (ref 0.9–3.3)
LYMPH%: 25.1 % (ref 14.0–49.0)
MCH: 29.7 pg (ref 27.2–33.4)
MCHC: 34 g/dL (ref 32.0–36.0)
MCV: 87.5 fL (ref 79.3–98.0)
MONO#: 0.8 10*3/uL (ref 0.1–0.9)
MONO%: 10.3 % (ref 0.0–14.0)
NEUT#: 4.9 10*3/uL (ref 1.5–6.5)
NEUT%: 62.6 % (ref 39.0–75.0)
PLATELETS: 192 10*3/uL (ref 140–400)
RBC: 3.68 10*6/uL — ABNORMAL LOW (ref 4.20–5.82)
RDW: 17.4 % — AB (ref 11.0–14.6)
WBC: 7.8 10*3/uL (ref 4.0–10.3)

## 2016-03-17 LAB — COMPREHENSIVE METABOLIC PANEL
ALBUMIN: 3.4 g/dL — AB (ref 3.5–5.0)
ALK PHOS: 226 U/L — AB (ref 40–150)
ALT: 39 U/L (ref 0–55)
ANION GAP: 7 meq/L (ref 3–11)
AST: 49 U/L — AB (ref 5–34)
BILIRUBIN TOTAL: 0.48 mg/dL (ref 0.20–1.20)
BUN: 14 mg/dL (ref 7.0–26.0)
CALCIUM: 9.4 mg/dL (ref 8.4–10.4)
CO2: 22 mEq/L (ref 22–29)
CREATININE: 1.1 mg/dL (ref 0.7–1.3)
Chloride: 108 mEq/L (ref 98–109)
EGFR: 71 mL/min/{1.73_m2} — ABNORMAL LOW (ref >90)
Glucose: 85 mg/dl (ref 70–140)
Potassium: 4 mEq/L (ref 3.5–5.1)
Sodium: 137 mEq/L (ref 136–145)
TOTAL PROTEIN: 7.7 g/dL (ref 6.4–8.3)

## 2016-03-17 LAB — TSH: TSH: 2.24 m[IU]/L (ref 0.320–4.118)

## 2016-03-17 MED ORDER — OXYCODONE HCL 30 MG PO TABS: 30.0000 mg | ORAL_TABLET | ORAL | 0 refills | Status: DC | PRN

## 2016-03-17 MED ORDER — OXYCODONE HCL ER 80 MG PO T12A: 80.0000 mg | EXTENDED_RELEASE_TABLET | Freq: Three times a day (TID) | ORAL | 0 refills | Status: DC

## 2016-03-17 MED ORDER — ALPRAZOLAM 1 MG PO TABS: 1.0000 mg | ORAL_TABLET | Freq: Three times a day (TID) | ORAL | 0 refills | Status: DC | PRN

## 2016-03-17 MED FILL — oxyCODONE HCL ER 80 MG T12A: 80 | 30 days supply | Qty: 90 | Fill #0

## 2016-03-17 MED FILL — ALPRAZolam 1 MG TABS: 1 | 30 days supply | Qty: 90 | Fill #0

## 2016-03-17 NOTE — Telephone Encounter (Signed)
Labs, Chemo and follow up with Dr Burr Medico scheduled per 03/17/16 los. Patient was given 2 bottles of contrast with instructions per CT ordered, also a copy of the AVS report and appointment schedule, per 03/17/16 los.

## 2016-03-18 LAB — AFP TUMOR MARKER: AFP, SERUM, TUMOR MARKER: 5.8 ng/mL (ref 0.0–8.3)

## 2016-03-20 ENCOUNTER — Ambulatory Visit (HOSPITAL_BASED_OUTPATIENT_CLINIC_OR_DEPARTMENT_OTHER): Payer: Medicare Other

## 2016-03-20 VITALS — BP 165/63 | HR 86 | Temp 98.3°F | Resp 18

## 2016-03-20 DIAGNOSIS — Z5112 Encounter for antineoplastic immunotherapy: Secondary | ICD-10-CM

## 2016-03-20 DIAGNOSIS — C220 Liver cell carcinoma: Secondary | ICD-10-CM

## 2016-03-20 MED ORDER — SODIUM CHLORIDE 0.9 % IV SOLN
240.0000 mg | Freq: Once | INTRAVENOUS | Status: AC
  Administered 2016-03-20: 240 mg via INTRAVENOUS
  Filled 2016-03-20: qty 20

## 2016-03-20 MED ORDER — SODIUM CHLORIDE 0.9 % IV SOLN
Freq: Once | INTRAVENOUS | Status: AC
  Administered 2016-03-20: 10:00:00 via INTRAVENOUS

## 2016-03-20 NOTE — Patient Instructions (Signed)
[facility] Discharge Instructions for Patients Receiving Chemotherapy  Today you received the following chemotherapy agents:  Nivolumab (Opdivo)  To help prevent nausea and vomiting after your treatment, we encourage you to take your nausea medication as prescribed.   If you develop nausea and vomiting that is not controlled by your nausea medication, call the clinic.   BELOW ARE SYMPTOMS THAT SHOULD BE REPORTED IMMEDIATELY:  *FEVER GREATER THAN 100.5 F  *CHILLS WITH OR WITHOUT FEVER  NAUSEA AND VOMITING THAT IS NOT CONTROLLED WITH YOUR NAUSEA MEDICATION  *UNUSUAL SHORTNESS OF BREATH  *UNUSUAL BRUISING OR BLEEDING  TENDERNESS IN MOUTH AND THROAT WITH OR WITHOUT PRESENCE OF ULCERS  *URINARY PROBLEMS  *BOWEL PROBLEMS  UNUSUAL RASH Items with * indicate a potential emergency and should be followed up as soon as possible.  Feel free to call the clinic you have any questions or concerns. The clinic phone number is [phone].  Please show the [facility] at check-in to the Emergency Department and triage nurse.

## 2016-03-23 MED FILL — oxyCODONE HCL 30 MG TABS: 30 | 20 days supply | Qty: 120 | Fill #0

## 2016-03-25 ENCOUNTER — Encounter: Payer: Self-pay | Admitting: Hematology

## 2016-03-25 NOTE — Progress Notes (Signed)
Rcvd letter from [facility], pt's Oxycodone '30mg'$  has been approved from 03/23/16 - 03/23/17.

## 2016-03-31 MED FILL — XARELTO 20 MG TABLET: 20 | 30 days supply | Qty: 30 | Fill #1

## 2016-04-02 ENCOUNTER — Other Ambulatory Visit (HOSPITAL_BASED_OUTPATIENT_CLINIC_OR_DEPARTMENT_OTHER): Payer: Medicare Other

## 2016-04-02 ENCOUNTER — Ambulatory Visit (HOSPITAL_BASED_OUTPATIENT_CLINIC_OR_DEPARTMENT_OTHER): Payer: Medicare Other

## 2016-04-02 ENCOUNTER — Other Ambulatory Visit: Payer: Self-pay | Admitting: Hematology

## 2016-04-02 VITALS — BP 130/59 | HR 94 | Temp 97.8°F | Resp 18

## 2016-04-02 DIAGNOSIS — Z5112 Encounter for antineoplastic immunotherapy: Secondary | ICD-10-CM | POA: Diagnosis present

## 2016-04-02 DIAGNOSIS — C220 Liver cell carcinoma: Secondary | ICD-10-CM

## 2016-04-02 LAB — COMPREHENSIVE METABOLIC PANEL
ALK PHOS: 182 U/L — AB (ref 40–150)
ALT: 23 U/L (ref 0–55)
AST: 33 U/L (ref 5–34)
Albumin: 3.5 g/dL (ref 3.5–5.0)
Anion Gap: 11 mEq/L (ref 3–11)
BUN: 17.4 mg/dL (ref 7.0–26.0)
CALCIUM: 9.7 mg/dL (ref 8.4–10.4)
CO2: 21 meq/L — AB (ref 22–29)
CREATININE: 1.4 mg/dL — AB (ref 0.7–1.3)
Chloride: 106 mEq/L (ref 98–109)
EGFR: 54 mL/min/{1.73_m2} — ABNORMAL LOW (ref >90)
Glucose: 109 mg/dl (ref 70–140)
Potassium: 4 mEq/L (ref 3.5–5.1)
Sodium: 137 mEq/L (ref 136–145)
TOTAL PROTEIN: 7.8 g/dL (ref 6.4–8.3)
Total Bilirubin: 0.63 mg/dL (ref 0.20–1.20)

## 2016-04-02 LAB — CBC WITH DIFFERENTIAL/PLATELET
BASO%: 0.4 % (ref 0.0–2.0)
Basophils Absolute: 0 10*3/uL (ref 0.0–0.1)
EOS%: 2.3 % (ref 0.0–7.0)
Eosinophils Absolute: 0.2 10*3/uL (ref 0.0–0.5)
HEMATOCRIT: 33.5 % — AB (ref 38.4–49.9)
HGB: 11.2 g/dL — ABNORMAL LOW (ref 13.0–17.1)
LYMPH#: 2.2 10*3/uL (ref 0.9–3.3)
LYMPH%: 31.4 % (ref 14.0–49.0)
MCH: 29.4 pg (ref 27.2–33.4)
MCHC: 33.4 g/dL (ref 32.0–36.0)
MCV: 87.9 fL (ref 79.3–98.0)
MONO#: 0.6 10*3/uL (ref 0.1–0.9)
MONO%: 8.7 % (ref 0.0–14.0)
NEUT%: 57.2 % (ref 39.0–75.0)
NEUTROS ABS: 4.1 10*3/uL (ref 1.5–6.5)
Platelets: 139 10*3/uL — ABNORMAL LOW (ref 140–400)
RBC: 3.81 10*6/uL — ABNORMAL LOW (ref 4.20–5.82)
RDW: 16.1 % — AB (ref 11.0–14.6)
WBC: 7.1 10*3/uL (ref 4.0–10.3)

## 2016-04-02 MED ORDER — SODIUM CHLORIDE 0.9 % IV SOLN
240.0000 mg | Freq: Once | INTRAVENOUS | Status: AC
  Administered 2016-04-02: 240 mg via INTRAVENOUS
  Filled 2016-04-02: qty 24

## 2016-04-02 MED ORDER — SODIUM CHLORIDE 0.9 % IV SOLN
Freq: Once | INTRAVENOUS | Status: AC
  Administered 2016-04-02: 09:00:00 via INTRAVENOUS

## 2016-04-02 NOTE — Patient Instructions (Signed)
[facility] Discharge Instructions for Patients Receiving Chemotherapy  Today you received the following chemotherapy agents:  Nivolumab (Opdivo)  To help prevent nausea and vomiting after your treatment, we encourage you to take your nausea medication as prescribed.   If you develop nausea and vomiting that is not controlled by your nausea medication, call the clinic.   BELOW ARE SYMPTOMS THAT SHOULD BE REPORTED IMMEDIATELY:  *FEVER GREATER THAN 100.5 F  *CHILLS WITH OR WITHOUT FEVER  NAUSEA AND VOMITING THAT IS NOT CONTROLLED WITH YOUR NAUSEA MEDICATION  *UNUSUAL SHORTNESS OF BREATH  *UNUSUAL BRUISING OR BLEEDING  TENDERNESS IN MOUTH AND THROAT WITH OR WITHOUT PRESENCE OF ULCERS  *URINARY PROBLEMS  *BOWEL PROBLEMS  UNUSUAL RASH Items with * indicate a potential emergency and should be followed up as soon as possible.  Feel free to call the clinic you have any questions or concerns. The clinic phone number is [phone].  Please show the [facility] at check-in to the Emergency Department and triage nurse.

## 2016-04-13 ENCOUNTER — Other Ambulatory Visit: Payer: Self-pay | Admitting: Hematology

## 2016-04-13 ENCOUNTER — Telehealth: Payer: Self-pay | Admitting: *Deleted

## 2016-04-13 DIAGNOSIS — C220 Liver cell carcinoma: Secondary | ICD-10-CM

## 2016-04-13 MED ORDER — OXYCODONE HCL ER 80 MG PO T12A: 80.0000 mg | EXTENDED_RELEASE_TABLET | Freq: Three times a day (TID) | ORAL | 0 refills | Status: DC

## 2016-04-13 MED ORDER — ALPRAZOLAM 1 MG PO TABS: 1.0000 mg | ORAL_TABLET | Freq: Three times a day (TID) | ORAL | 0 refills | Status: DC | PRN

## 2016-04-13 MED ORDER — OXYCODONE HCL 30 MG PO TABS: 30.0000 mg | ORAL_TABLET | ORAL | 0 refills | Status: DC | PRN

## 2016-04-13 MED FILL — OxyCONTIN 80 MG T12A: 80 | 30 days supply | Qty: 90 | Fill #0

## 2016-04-13 MED FILL — ALPRAZolam 1 MG TABS: 1 | 30 days supply | Qty: 90 | Fill #0

## 2016-04-13 MED FILL — oxyCODONE HCL 30 MG TABS: 30 | 20 days supply | Qty: 120 | Fill #0

## 2016-04-13 NOTE — Telephone Encounter (Signed)
Patient called requesting refill for Oxycodone 30/oxycodone 80/ Xanax. Please "Home" number once it is ready for pick up.

## 2016-04-14 MED FILL — PROCHLORPERAZINE 10 MG TAB: 10 | 10 days supply | Qty: 30 | Fill #1

## 2016-04-14 NOTE — Progress Notes (Signed)
[place]  Telephone:([phone]) [phone] Fax:(336) [phone]  Clinic Follow up Note   Patient Care Team: Antonietta Jewel, MD as PCP - General (Internal Medicine) Daneil Dolin, MD as Attending Physician (Gastroenterology) Antonietta Jewel, MD as Referring Physician (Internal Medicine) Truitt Merle, MD as Consulting Physician (Hematology) [name], CRNP as Nurse Practitioner (Nurse Practitioner) 04/16/2016   CHIEF COMPLAINTS:  Follow up recurrent [place]   Oncology History   Hepatocellular carcinoma ([place])   Staging form: Liver (Excluding Intrahepatic Bile Ducts), AJCC 7th Edition   - Clinical stage from 04/16/2012: Stage II (T2(m), N0, M0) - Signed by Truitt Merle, MD on 10/12/2015   - Pathologic stage from 04/16/2013: Stage II (T2, N0, cM0) - Signed by Truitt Merle, MD on 10/12/2015      Hepatocellular carcinoma ([place])   09/14/2012 Tumor Marker    AFP 28.9      04/16/2013 Imaging    Abdominal MRI with and without contrast reviewed multifocal (4) liver lesions in both left and right lobes, most consistent with [place], largest measuring 2.7 cm      04/16/2013 Initial Diagnosis    Hepatocellular carcinoma ([place])      04/28/2013 Procedure    Right TACE with lipiodol       06/15/2013 Procedure    Left TACE with [facility]       07/14/2013 Procedure    Right TACE with [facility]      09/05/2015 Imaging    CT abdomen with and without contrast showed a new 5.0 x 2.3 cm mass in the hepatic segment 8, invading and occluding the right anterior portal vein, most compatible with [place]. A portacaval node measuring 3.0 x 1.4 cm, previously 2.9 x 1.1 cm.      10/11/2015 Tumor Marker    AFP 5.3      11/11/2015 - 02/03/2016 Chemotherapy    sorafenib '200mg'$  bid started on 11/11/2015, increased to '400mg'$  bid in 2 weeks, stopped due to poor tolerance and probable disease progression       11/14/2015 Imaging    CT CAP 11/14/2015 IMPRESSION: Chest Impression: 1. RIGHT upper lobe pulmonary nodule is indeterminate. No  comparison available. 2. Ground-glass opacity and peripheral nodules in the RIGHT middle lobe appear post infectious or inflammatory.  Abdomen / Pelvis Impression: 1. 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 Thrombus also likely within the LEFT and RIGHT portal vein. Difficult to distinguish tumor thrombus versus bland thrombus. 2. Individual lesions are difficult to define in the RIGHT hepatic lobe. There is overall impression of progression of disease in the RIGHT hepatic lobe with multiple ill-defined enhancing lesions. 3. Periportal and shotty retroperitoneal adenopathy similar to prior.      01/25/2016 Imaging    CT Abdomen Pelvis w/ Contrast IMPRESSION: 1. Findings consistent with multifocal liver carcinoma with extensive portal vein thrombosis, and subsequent development of porta hepatis venous collaterals. Thrombus in the central portal vein is no longer expansile, but still expands the more peripheral portal vein and the right and left portal veins. There are few right liver bile ducts there are now dilated, which suggests mild progression of liver disease. 2. There is mild splenomegaly consistent or venous hypertension, which has mildly increased from the prior CT. 3. There are new lung abnormalities with interstitial thickening and ill-defined peribronchovascular nodular opacities mostly at the right lung base with a smaller area noted along the medial left lower lobe. The  etiology may be infectious. It may be inflammatory, possibly related to chemotherapy. Neoplastic disease is also possible. 4. There is no other evidence of metastatic disease within the abdomen or pelvis. No acute findings within the abdomen or pelvis      01/25/2016 - 01/25/2016 Hospital Admission    Pt was seen in ED for worsening abdominal pain, treated with pain meds       02/04/2016 -  Chemotherapy     The patient started on immunotherapy treatment nivolumab every 2 weeks       02/14/2016 Imaging    CT CAP w/ contrast 1. Slight interval increase in size of the mediastinal lymph nodes. 2. Persistent extensive lower lobe peribronchial thickening and patchy infiltrates. Possible aspiration pneumonia. 3. Persistent tree-in-bud appearance in the right middle lobe, likely chronic inflammation or atypical infection. 4. Improved CT appearance of the liver. Two small residual arterial phase enhancing lesions are identified. 5. Chronic portal vein thrombosis with cavernous transformation of portal venous collaterals. Esophageal varices are again noted. 6. Stable upper abdominal lymph nodes likely related to cirrhosis.        HISTORY OF PRESENTING ILLNESS (10/11/2015):  Kevin Dillon 66 y.o. male with past medical history of treated hepatitis C, liver cirrhosis, and hepatocellular carcinoma is here because of recurrent hepatocellular carcinoma. He is accompanied by his significant other [name] and her sister.  He was diagnosed with hepatocellular carcinoma in 2015, his initial image result is not available and staging is unknown. He was seen by interventional radiologist Dr. Delice Lesch at [facility] in [place] and underwent TACE procedure 3 times in 2015. He was subsequently followed, and recently repeated CT scan showed a new 5.0 cm mass in segment 8, with portal vein invasion. He was felt not to be a candidate for further liver targeted therapy, and was referred to [place] for further systemic therapy.  He complains about mid epigastric pain for the past 2 years, which significantly improved after his TACE procedure in 2015. It has been getting worse lately in the past 6 months. He states he gets about 7-8 out of 10, persistent, he has been taking oxycodone 20 mg every 6 hours, but his pain is not well controlled. He is quite fatigued, is low appetite. He last 40 lbs in the pat 4 months. He is able to  function at home, in trying to remain to be physically active.  His hepatitis C was successfully treated. He has history of hepatitis C, complicated with ascites and encephalopathy, but it has been well controlled with medical management.   CURRENT THERAPY: Nivolumab '240mg'$  every 2 weeks, started on 02/04/2015  INTERIM HISTORY: Kevin Dillon returns for follow up. He has been doing well.   He says things are going good but he has a lot on his mind. He has started back smoking because he's nervous about dying and has been experiencing SOB. He smokes a little less than a pack a day. His pain has improved and is trying to take less oxycodone, 4 daily and oxycontin around 3 times a day. He has gained weight.    MEDICAL HISTORY:  Past Medical History:  Diagnosis Date  . Cancer ([place])    liver  . Cholelithiasis   . Chronic hepatitis C ([place])   . Chronic knee pain   . Chronic left shoulder pain   . CVA (cerebral infarction)   . Gout   . Hepatitis C    genotype 1b.  pt has been vaccinated against hep  A and B.  . Hypertension   . Osteoarthritis   . Schatzki's ring   . Tubular adenoma of colon 12/2011    SURGICAL HISTORY: Past Surgical History:  Procedure Laterality Date  . AMPUTATION Left 09/17/2012   Procedure: SMALL FINGER EXTENSOR TENDON REPAIR; METACARPAL LEVEL AMPUTATION RING FINGER; PROXIMAL PHALANX LEVEL AMPUTATION LONG FINGER;  Surgeon: Schuyler Amor, MD;  Location: [facility];  Service: Orthopedics;  Laterality: Left;  . Arm surgery     right/plate in arm  . COLONOSCOPY WITH ESOPHAGOGASTRODUODENOSCOPY (EGD)  12/30/2011   RMR: Noncritical Schatzki's ring;  Hiatal hernia, Tubular ADENOMA removed from splenic flexure, otherwise normal colonoscopy  . LEG SURGERY     left  . WOUND EXPLORATION Left 09/17/2012   Procedure: WOUND EXPLORATION;  Surgeon: Schuyler Amor, MD;  Location: [facility];  Service: Orthopedics;  Laterality: Left;    SOCIAL HISTORY: Social History   Social History  .  Marital status: Single    Spouse name: N/A  . Number of children: 0  . Years of education: N/A   Occupational History  . disabled    Social History Main Topics  . Smoking status: Former Smoker    Packs/day: 0.50    Years: 40.00    Types: Cigarettes    Quit date: 01/19/2012  . Smokeless tobacco: Never Used     Comment: Smokes 1/2 pack of cigarettes daily  . Alcohol use No     Comment: HX 2 beers 3-4 days per week; QUIT DEC 2013  . Drug use: No     Comment: Hx cocaine yrs ago, Last marijuana OCT 2013  . Sexual activity: Not Currently    Partners: Female   Other Topics Concern  . Not on file   Social History Narrative   Lives w/ significant other, [name]    FAMILY HISTORY: Family History  Problem Relation Age of Onset  . Cirrhosis Father 42  . Cancer Father     liver cancer   . Lung cancer Mother 64  . Colon cancer Neg Hx     ALLERGIES:  has No Known Allergies.  MEDICATIONS:  Current Outpatient Prescriptions  Medication Sig Dispense Refill  . ALPRAZolam (XANAX) 1 MG tablet Take 1 tablet (1 mg total) by mouth 3 (three) times daily as needed for anxiety. 90 tablet 0  . hydrALAZINE (APRESOLINE) 25 MG tablet Take 50 mg by mouth daily.     Marland Kitchen lactulose (CHRONULAC) 10 GM/15ML solution 30 g daily as needed for moderate constipation.     . megestrol (MEGACE) 40 MG/ML suspension Take 400 mg by mouth daily.     . Multiple Vitamins-Minerals (CENTRUM SILVER PO) Take 1 tablet by mouth daily.     Marland Kitchen oxyCODONE (OXYCONTIN) 80 mg 12 hr tablet Take 1 tablet (80 mg total) by mouth every 8 (eight) hours. 90 tablet 0  . oxycodone (ROXICODONE) 30 MG immediate release tablet Take 1 tablet (30 mg total) by mouth every 4 (four) hours as needed for pain. 120 tablet 0  . prochlorperazine (COMPAZINE) 10 MG tablet Take 1 tablet (10 mg total) by mouth every 8 (eight) hours as needed for nausea or vomiting. 30 tablet 1  . rivaroxaban (XARELTO) 20 MG TABS tablet Take 1 tablet (20 mg total) by  mouth daily with supper. 30 tablet 2  . PARoxetine (PAXIL) 20 MG tablet Take 20 mg by mouth every morning.      No current facility-administered medications for this visit.    Facility-Administered  Medications Ordered in Other Visits  Medication Dose Route Frequency Provider Last Rate Last Dose  . nivolumab (OPDIVO) 240 mg in sodium chloride 0.9 % 100 mL chemo infusion  240 mg Intravenous Once Truitt Merle, MD 248 mL/hr at 04/16/16 1159 240 mg at 04/16/16 1159    REVIEW OF SYSTEMS:   Constitutional: Denies fevers, chills or abnormal night sweats Eyes: Denies blurriness of vision, double vision or watery eyes Ears, nose, mouth, throat, and face: Denies mucositis or sore throat Respiratory: Denies cough, dyspnea or wheezes Cardiovascular: Denies palpitation, chest discomfort or lower extremity swelling Gastrointestinal:  Denies nausea, heartburn or change in bowel habits,  Skin: Denies abnormal skin rashes Lymphatics: Denies new lymphadenopathy or easy bruising Neurological:Denies numbness, tingling or new weaknesses Behavioral/Psych: Mood is stable, no new changes  All other systems were reviewed with the patient and are negative.  PHYSICAL EXAMINATION ECOG PERFORMANCE STATUS: 2  Vitals:   04/16/16 1013  BP: (!) 171/81  Pulse: 91  Resp: 18  Temp: 97.9 F (36.6 C)   Filed Weights   04/16/16 1013  Weight: 176 lb 14.4 oz (80.2 kg)    GENERAL:alert,  SKIN: skin color, texture, turgor are normal, no rashes or significant lesions EYES: normal, conjunctiva are pink and non-injected, sclera clear OROPHARYNX:no exudate, no erythema and lips, buccal mucosa, and tongue normal  NECK: supple, thyroid normal size, non-tender, without nodularity LYMPH:  no palpable lymphadenopathy in the cervical, axillary or inguinal LUNGS: clear to auscultation and percussion with normal breathing effort HEART: regular rate & rhythm and no murmurs and no lower extremity edema ABDOMEN:abdomen soft,  non-tender and normal bowel sounds,  (+) abdominal bloating  Musculoskeletal:no cyanosis of digits and no clubbing  PSYCH: alert & oriented x 3 with fluent speech NEURO: no focal motor/sensory deficits  LABORATORY DATA:  I have reviewed the data as listed CBC Latest Ref Rng & Units 04/16/2016 04/02/2016 03/17/2016  WBC 4.0 - 10.3 10e3/uL 7.8 7.1 7.8  Hemoglobin 13.0 - 17.1 g/dL 10.8(L) 11.2(L) 10.9(L)  Hematocrit 38.4 - 49.9 % 31.6(L) 33.5(L) 32.2(L)  Platelets 140 - 400 10e3/uL 188 139(L) 192   CMP Latest Ref Rng & Units 04/16/2016 04/02/2016 03/17/2016  Glucose 70 - 140 mg/dl 131 109 85  BUN 7.0 - 26.0 mg/dL 13.1 17.4 14.0  Creatinine 0.7 - 1.3 mg/dL 1.2 1.4(H) 1.1  Sodium 136 - 145 mEq/L 138 137 137  Potassium 3.5 - 5.1 mEq/L 3.4(L) 4.0 4.0  Chloride 101 - 111 mmol/L - - -  CO2 22 - 29 mEq/L 21(L) 21(L) 22  Calcium 8.4 - 10.4 mg/dL 9.3 9.7 9.4  Total Protein 6.4 - 8.3 g/dL 7.6 7.8 7.7  Total Bilirubin 0.20 - 1.20 mg/dL 0.53 0.63 0.48  Alkaline Phos 40 - 150 U/L 117 182(H) 226(H)  AST 5 - 34 U/L 24 33 49(H)  ALT 0 - 55 U/L 14 23 39   AFP: 09/14/2012: 28.9 10/11/2015: 5.3 11/01/2015: 3.7 12/02/2015: 3.7 01/22/2016: 3.4 02/18/2016: 7.1 03/17/16: 5.8  RADIOGRAPHIC STUDIES: I have personally reviewed the radiological images as listed and agreed with the findings in the report.  CT CAP w/ contrast 02/14/2016 IMPRESSION: 1. Slight interval increase in size of the mediastinal lymph nodes. 2. Persistent extensive lower lobe peribronchial thickening and patchy infiltrates. Possible aspiration pneumonia. 3. Persistent tree-in-bud appearance in the right middle lobe, likely chronic inflammation or atypical infection. 4. Improved CT appearance of the liver. Two small residual arterial phase enhancing lesions are identified. 5. Chronic portal vein thrombosis with  cavernous transformation of portal venous collaterals. Esophageal varices are again noted. 6. Stable upper abdominal lymph nodes  likely related to cirrhosis.  CT abdomen pelvis w/ contrast 01/25/2016 IMPRESSION: 1. Findings consistent with multifocal liver carcinoma with extensive portal vein thrombosis, and subsequent development of porta hepatis venous collaterals. Thrombus in the central portal vein is no longer expansile, but still expands the more peripheral portal vein and the right and left portal veins. There are few right liver bile ducts there are now dilated, which suggests mild progression of liver disease. 2. There is mild splenomegaly consistent or venous hypertension, which has mildly increased from the prior CT. 3. There are new lung abnormalities with interstitial thickening and ill-defined peribronchovascular nodular opacities mostly at the right lung base with a smaller area noted along the medial left lower lobe. The etiology may be infectious. It may be inflammatory, possibly related to chemotherapy. Neoplastic disease is also possible. 4. There is no other evidence of metastatic disease within the abdomen or pelvis. No acute findings within the abdomen or pelvis.  ASSESSMENT & PLAN: 66 y.o. Caucasian male with past medical history of successfully treated hepatitis C, liver cirrhosis, history of ascites and encephalopathy, recurrent HCC  1. Recurrent HCC, initially stage II -I previously reviewed his previous image and outside medical records extensively, confirmed key findings with patient and his family members -He initially had a multifocal disease, status post TACE 3 times in 2015 -he now has developed symptomatic local recurrence, with a 5cm new lesion in segment 8 with direct invasion into portal vein, and a portocaval lymph node. He is not a candidate for liver targeted therapy per his IR Dr. Delice Lesch -he is currently on first line systemic therapy with sorafenib, not able to tolerate full dose, doing well on 600 mg daily, will continue  -His previous CT abdomen and pelvis on 01/25/2016  revealed extensive portal hypertension, and a probable cancer progression in the liver. -His treatment has changed to immunotherapy Nivolumab, he tolerated very well, and clinically he is doing much better, with less pain and better energy. Continue.  -I previously discussed his restaging CT scan which was done on 02/14/2016, it actually showed decreased liver cancer size compared to the scan a month ago. No other new metastasis. -He is clinically doing well, we'll continue Nivolumab every 2 weeks -His restaging CT scan is scheduled for tomorrow -I previously recommended a port placement, he declined  -He is clinically doing well, lab reviewed, adequate for treatment, we'll continue Nivolumab every 2 weeks  2. Abdominal pain -Secondary to [facility] and portal vein thrombosis -overall better now with oxycodone and OxyContin, he is on very high dose, overall pain is controlled  -continue OxyContin 80 mg every 8 hours, and oxycodone '30mg'$  every 3-4 hours. I will refilled Oxycodone today. -I previously recommended him to see pain management clinic, he declined. -he is taking oxycodone 6 pain pills a day. I previously encouraged him to continue the OxyContin, but to only take the oxycodone PRN.   3. Portal vein thrombosis -Worse on the previous CT scan. I think it probably contributes to his abdominal pain also -I previously recommended him to start anticoagulation. The options of Lovenox injection, Coumadin, and Xarelto were discussed with patient, he opted to Xarelto. 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 Hep C, successfully treated -Continue follow-up with liver clinic NP Dawn  6. Anxiety and insomnia  -He has Xanax for anxiety. I previously refilled Xanax 1 mg. -continue Ativan 1 mg at bedtime as needed for insomnia, he knows not to take with Xanax together, he knows not to take during the day. He does not feel Ativan is working well for sleep, he may take xanax for insomnia   7. Goal of care discussion, DNR/DNI  -We previously discussed the incurable nature of his cancer, and the overall poor prognosis, especially if he does not have good response to treatment -The patient understands the goal of care is palliative. -I recommend DNR/DNI, he agrees   8. HTN -He has stopped taking his BP medication. His BP today was 171/81 -He will monitor his BP at home. -I encourage him to follow up with PCP, and restart his BP meds   9. Smoking - Patient has started back smoking due to the fear of not getting better - I strongly recommended a smoking cessation program. He refused   Plan -Continue new follow map today and every 2 weeks -He is scheduled to have restaging CT scan tomorrow -Follow up in 2 weeks -Patient will call, and the liver clinic to see NP Dawn regarding his ascites management   All questions were answered. The patient knows to call the clinic with any problems, questions or concerns.  I spent 20 minutes counseling the patient face to face. The total time spent in the appointment was 30 minutes and more than 50% was on counseling.  This document serves as a record of services personally performed by Truitt Merle, MD. It was created on her behalf by Brandt Loosen, a trained medical scribe. The creation of this record is based on the scribe's personal observations and the provider's statements to them. This document has been checked and approved by the attending provider.  I have reviewed  the above documentation for accuracy and completeness and I agree with the above.   Truitt Merle, MD 04/16/2016

## 2016-04-16 ENCOUNTER — Telehealth: Payer: Self-pay | Admitting: Hematology

## 2016-04-16 ENCOUNTER — Other Ambulatory Visit (HOSPITAL_BASED_OUTPATIENT_CLINIC_OR_DEPARTMENT_OTHER): Payer: Medicare Other

## 2016-04-16 ENCOUNTER — Encounter: Payer: Self-pay | Admitting: Hematology

## 2016-04-16 ENCOUNTER — Ambulatory Visit (HOSPITAL_BASED_OUTPATIENT_CLINIC_OR_DEPARTMENT_OTHER): Payer: Medicare Other | Admitting: Hematology

## 2016-04-16 ENCOUNTER — Ambulatory Visit (HOSPITAL_BASED_OUTPATIENT_CLINIC_OR_DEPARTMENT_OTHER): Payer: Medicare Other

## 2016-04-16 VITALS — BP 171/81 | HR 91 | Temp 97.9°F | Resp 18 | Ht 73.0 in | Wt 176.9 lb

## 2016-04-16 DIAGNOSIS — F419 Anxiety disorder, unspecified: Secondary | ICD-10-CM

## 2016-04-16 DIAGNOSIS — I81 Portal vein thrombosis: Secondary | ICD-10-CM

## 2016-04-16 DIAGNOSIS — C220 Liver cell carcinoma: Secondary | ICD-10-CM

## 2016-04-16 DIAGNOSIS — G893 Neoplasm related pain (acute) (chronic): Secondary | ICD-10-CM

## 2016-04-16 DIAGNOSIS — R109 Unspecified abdominal pain: Secondary | ICD-10-CM | POA: Diagnosis not present

## 2016-04-16 DIAGNOSIS — Z5112 Encounter for antineoplastic immunotherapy: Secondary | ICD-10-CM

## 2016-04-16 DIAGNOSIS — Z79899 Other long term (current) drug therapy: Secondary | ICD-10-CM | POA: Diagnosis not present

## 2016-04-16 DIAGNOSIS — G4700 Insomnia, unspecified: Secondary | ICD-10-CM | POA: Diagnosis not present

## 2016-04-16 DIAGNOSIS — K7469 Other cirrhosis of liver: Secondary | ICD-10-CM | POA: Diagnosis not present

## 2016-04-16 LAB — CBC WITH DIFFERENTIAL/PLATELET
BASO%: 0.4 % (ref 0.0–2.0)
BASOS ABS: 0 10*3/uL (ref 0.0–0.1)
EOS ABS: 0.2 10*3/uL (ref 0.0–0.5)
EOS%: 2.3 % (ref 0.0–7.0)
HCT: 31.6 % — ABNORMAL LOW (ref 38.4–49.9)
HEMOGLOBIN: 10.8 g/dL — AB (ref 13.0–17.1)
LYMPH%: 26.1 % (ref 14.0–49.0)
MCH: 30.3 pg (ref 27.2–33.4)
MCHC: 34.3 g/dL (ref 32.0–36.0)
MCV: 88.4 fL (ref 79.3–98.0)
MONO#: 0.6 10*3/uL (ref 0.1–0.9)
MONO%: 7.7 % (ref 0.0–14.0)
NEUT%: 63.5 % (ref 39.0–75.0)
NEUTROS ABS: 4.9 10*3/uL (ref 1.5–6.5)
PLATELETS: 188 10*3/uL (ref 140–400)
RBC: 3.57 10*6/uL — ABNORMAL LOW (ref 4.20–5.82)
RDW: 15.4 % — AB (ref 11.0–14.6)
WBC: 7.8 10*3/uL (ref 4.0–10.3)
lymph#: 2 10*3/uL (ref 0.9–3.3)

## 2016-04-16 LAB — COMPREHENSIVE METABOLIC PANEL
ALBUMIN: 3.5 g/dL (ref 3.5–5.0)
ALK PHOS: 117 U/L (ref 40–150)
ALT: 14 U/L (ref 0–55)
AST: 24 U/L (ref 5–34)
Anion Gap: 12 mEq/L — ABNORMAL HIGH (ref 3–11)
BILIRUBIN TOTAL: 0.53 mg/dL (ref 0.20–1.20)
BUN: 13.1 mg/dL (ref 7.0–26.0)
CO2: 21 mEq/L — ABNORMAL LOW (ref 22–29)
Calcium: 9.3 mg/dL (ref 8.4–10.4)
Chloride: 105 mEq/L (ref 98–109)
Creatinine: 1.2 mg/dL (ref 0.7–1.3)
EGFR: 61 mL/min/{1.73_m2} — AB (ref >90)
Glucose: 131 mg/dl (ref 70–140)
POTASSIUM: 3.4 meq/L — AB (ref 3.5–5.1)
SODIUM: 138 meq/L (ref 136–145)
TOTAL PROTEIN: 7.6 g/dL (ref 6.4–8.3)

## 2016-04-16 LAB — TSH: TSH: 6.539 m(IU)/L — ABNORMAL HIGH (ref 0.320–4.118)

## 2016-04-16 MED ORDER — SODIUM CHLORIDE 0.9 % IV SOLN
Freq: Once | INTRAVENOUS | Status: AC
  Administered 2016-04-16: 12:00:00 via INTRAVENOUS

## 2016-04-16 MED ORDER — SODIUM CHLORIDE 0.9 % IV SOLN
240.0000 mg | Freq: Once | INTRAVENOUS | Status: AC
  Administered 2016-04-16: 240 mg via INTRAVENOUS
  Filled 2016-04-16: qty 24

## 2016-04-16 NOTE — Telephone Encounter (Signed)
Gave patient AVS and calender per 04/16/2016 los.

## 2016-04-16 NOTE — Patient Instructions (Signed)
[facility] Discharge Instructions for Patients Receiving Chemotherapy  Today you received the following chemotherapy agents Nivolumab  To help prevent nausea and vomiting after your treatment, we encourage you to take your nausea medication     If you develop nausea and vomiting that is not controlled by your nausea medication, call the clinic.   BELOW ARE SYMPTOMS THAT SHOULD BE REPORTED IMMEDIATELY:  *FEVER GREATER THAN 100.5 F  *CHILLS WITH OR WITHOUT FEVER  NAUSEA AND VOMITING THAT IS NOT CONTROLLED WITH YOUR NAUSEA MEDICATION  *UNUSUAL SHORTNESS OF BREATH  *UNUSUAL BRUISING OR BLEEDING  TENDERNESS IN MOUTH AND THROAT WITH OR WITHOUT PRESENCE OF ULCERS  *URINARY PROBLEMS  *BOWEL PROBLEMS  UNUSUAL RASH Items with * indicate a potential emergency and should be followed up as soon as possible.  Feel free to call the clinic you have any questions or concerns. The clinic phone number is [phone].  Please show the CHEMO ALERT CARD at check-in to the Emergency Department and triage nurse.   

## 2016-04-17 ENCOUNTER — Ambulatory Visit (HOSPITAL_COMMUNITY)
Admission: RE | Admit: 2016-04-17 | Discharge: 2016-04-17 | Disposition: A | Discharge status: Home / Self Care | Payer: Medicare Other | Source: Ambulatory Visit | Attending: Hematology | Admitting: Hematology

## 2016-04-17 ENCOUNTER — Encounter (HOSPITAL_COMMUNITY): Payer: Self-pay

## 2016-04-17 DIAGNOSIS — I8500 Esophageal varices without bleeding: Secondary | ICD-10-CM | POA: Diagnosis not present

## 2016-04-17 DIAGNOSIS — R932 Abnormal findings on diagnostic imaging of liver and biliary tract: Secondary | ICD-10-CM | POA: Insufficient documentation

## 2016-04-17 DIAGNOSIS — R590 Localized enlarged lymph nodes: Secondary | ICD-10-CM | POA: Diagnosis not present

## 2016-04-17 DIAGNOSIS — C220 Liver cell carcinoma: Secondary | ICD-10-CM | POA: Diagnosis not present

## 2016-04-17 DIAGNOSIS — K769 Liver disease, unspecified: Secondary | ICD-10-CM | POA: Diagnosis not present

## 2016-04-17 DIAGNOSIS — R918 Other nonspecific abnormal finding of lung field: Secondary | ICD-10-CM | POA: Diagnosis not present

## 2016-04-17 LAB — AFP TUMOR MARKER: AFP, Serum, Tumor Marker: 4.1 ng/mL (ref 0.0–8.3)

## 2016-04-17 MED ORDER — IOPAMIDOL (ISOVUE-300) INJECTION 61%
INTRAVENOUS | Status: AC
  Filled 2016-04-17: qty 100

## 2016-04-17 MED ORDER — IOPAMIDOL (ISOVUE-300) INJECTION 61%
100.0000 mL | Freq: Once | INTRAVENOUS | Status: AC | PRN
  Administered 2016-04-17: 100 mL via INTRAVENOUS

## 2016-04-23 NOTE — Progress Notes (Signed)
[place]  [phone] [phone]) [phone]  Clinic Follow up Note   Patient Care Team: Antonietta Jewel, MD as PCP - General (Internal Medicine) Daneil Dolin, MD as Attending Physician (Gastroenterology) Antonietta Jewel, MD as Referring Physician (Internal Medicine) Truitt Merle, MD as Consulting Physician (Hematology) [name], CRNP as Nurse Practitioner (Nurse Practitioner) 04/30/2016   CHIEF COMPLAINTS:  Follow up recurrent [place]   Oncology History   Hepatocellular carcinoma ([place])   Staging form: Liver (Excluding Intrahepatic Bile Ducts), AJCC 7th Edition   - Clinical stage from 04/16/2012: Stage II (T2(m), N0, M0) - Signed by Truitt Merle, MD on 10/12/2015   - Pathologic stage from 04/16/2013: Stage II (T2, N0, cM0) - Signed by Truitt Merle, MD on 10/12/2015      Hepatocellular carcinoma ([place])   09/14/2012 Tumor Marker    AFP 28.9      04/16/2013 Imaging    Abdominal MRI with and without contrast reviewed multifocal (4) liver lesions in both left and right lobes, most consistent with Axis, largest measuring 2.7 cm      04/16/2013 Initial Diagnosis    Hepatocellular carcinoma ([place])      04/28/2013 Procedure    Right TACE with lipiodol       06/15/2013 Procedure    Left TACE with [facility]       07/14/2013 Procedure    Right [facility] with [facility]      09/05/2015 Imaging    CT abdomen with and without contrast showed a new 5.0 x 2.3 cm mass in the hepatic segment 8, invading and occluding the right anterior portal vein, most compatible with [facility]. A portacaval node measuring 3.0 x 1.4 cm, previously 2.9 x 1.1 cm.      10/11/2015 Tumor Marker    AFP 5.3      11/11/2015 - 02/03/2016 Chemotherapy    sorafenib '200mg'$  bid started on 11/11/2015, increased to '400mg'$  bid in 2 weeks, stopped due to poor tolerance and probable disease progression       11/14/2015 Imaging    CT CAP 11/14/2015 IMPRESSION: Chest Impression: 1. RIGHT upper lobe pulmonary nodule is indeterminate. No  comparison available. 2. Ground-glass opacity and peripheral nodules in the RIGHT middle lobe appear post infectious or inflammatory.  Abdomen / Pelvis Impression: 1. 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 Thrombus also likely within the LEFT and RIGHT portal vein. Difficult to distinguish tumor thrombus versus bland thrombus. 2. Individual lesions are difficult to define in the RIGHT hepatic lobe. There is overall impression of progression of disease in the RIGHT hepatic lobe with multiple ill-defined enhancing lesions. 3. Periportal and shotty retroperitoneal adenopathy similar to prior.      01/25/2016 Imaging    CT Abdomen Pelvis w/ Contrast IMPRESSION: 1. Findings consistent with multifocal liver carcinoma with extensive portal vein thrombosis, and subsequent development of porta hepatis venous collaterals. Thrombus in the central portal vein is no longer expansile, but still expands the more peripheral portal vein and the right and left portal veins. There are few right liver bile ducts there are now dilated, which suggests mild progression of liver disease. 2. There is mild splenomegaly consistent or venous hypertension, which has mildly increased from the prior CT. 3. There are new lung abnormalities with interstitial thickening and ill-defined peribronchovascular nodular opacities mostly at the right lung base with a smaller area noted along the medial left lower lobe. The  etiology may be infectious. It may be inflammatory, possibly related to chemotherapy. Neoplastic disease is also possible. 4. There is no other evidence of metastatic disease within the abdomen or pelvis. No acute findings within the abdomen or pelvis      01/25/2016 - 01/25/2016 Hospital Admission    Pt was seen in ED for worsening abdominal pain, treated with pain meds       02/04/2016 -  Chemotherapy     The patient started on immunotherapy treatment nivolumab every 2 weeks       02/14/2016 Imaging    CT CAP w/ contrast 1. Slight interval increase in size of the mediastinal lymph nodes. 2. Persistent extensive lower lobe peribronchial thickening and patchy infiltrates. Possible aspiration pneumonia. 3. Persistent tree-in-bud appearance in the right middle lobe, likely chronic inflammation or atypical infection. 4. Improved CT appearance of the liver. Two small residual arterial phase enhancing lesions are identified. 5. Chronic portal vein thrombosis with cavernous transformation of portal venous collaterals. Esophageal varices are again noted. 6. Stable upper abdominal lymph nodes likely related to cirrhosis.       04/16/2016 Tumor Marker    AFP 4.1       04/17/2016 Imaging     CT CAP W PELVIS IMPRESSION: 1. Stable mild mediastinal lymphadenopathy. 2. Interval improvement in the bibasilar peribronchial thickening, airway impaction, and peribronchovascular nodularity. Imaging features suggest marked improvement in bilateral low the atypical infection. Changes in the right middle lobe with more stable and may represent scarring from previous infectious/inflammatory etiology. 3. The small hypervascular lesions seen in the anterior left liver on the previous study are not evident today. There is some subtle, ill-defined hyperenhancement in the central left liver which is nonspecific. Attention to this area on followup imaging recommended. 4. Chronic portal vein inclusion with cavernous transformation in the porta hepatis and paraesophageal varices. 5. Stable appearance hepatoduodenal and retroperitoneal lymphadenopathy, likely chronic and related to liver disease.      04/17/2016 Imaging    CT CAP CONTRAST IMPRESSION: 1. Stable mild mediastinal lymphadenopathy. 2. Interval improvement in the bibasilar peribronchial thickening, airway impaction, and peribronchovascular nodularity.  Imaging features suggest marked improvement in bilateral low the atypical infection. Changes in the right middle lobe with more stable and may represent scarring from previous infectious/inflammatory etiology. 3. The small hypervascular lesions seen in the anterior left liver on the previous study are not evident today. There is some subtle, ill-defined hyperenhancement in the central left liver which is nonspecific. Attention to this area on followup imaging recommended. 4. Chronic portal vein inclusion with cavernous transformation in the porta hepatis and paraesophageal varices. 5. Stable appearance hepatoduodenal and retroperitoneal lymphadenopathy, likely chronic and related to liver disease.       HISTORY OF PRESENTING ILLNESS (10/11/2015):  Kevin Dillon 66 y.o. male with past medical history of treated hepatitis C, liver cirrhosis, and hepatocellular carcinoma is here because of recurrent hepatocellular carcinoma. He is accompanied by his significant other [name] and her sister.  He was diagnosed with hepatocellular carcinoma in 2015, his initial image result is not available and staging is unknown. He was seen by interventional radiologist Dr. Delice Lesch at [facility] in [place] and underwent TACE procedure 3 times in 2015. He was subsequently followed, and recently repeated CT scan showed a new 5.0 cm mass in segment 8, with portal vein invasion. He was felt not to be a candidate for further liver targeted therapy, and was referred to [place] for further systemic therapy.  He complains about mid epigastric pain for the past 2 years, which significantly improved after his TACE procedure in 2015. It has been getting worse lately in the past 6 months. He states he gets about 7-8 out of 10, persistent, he has been taking oxycodone 20 mg every 6 hours, but his pain is not well controlled. He is quite fatigued, is low appetite. He last 40 lbs in the pat 4 months. He is able to function at  home, in trying to remain to be physically active.  His hepatitis C was successfully treated. He has history of hepatitis C, complicated with ascites and encephalopathy, but it has been well controlled with medical management.   CURRENT THERAPY: Nivolumab '240mg'$  every 2 weeks, started on 02/04/2015  INTERIM HISTORY: Hanan returns for follow up.  He has been experiencing joint pain throughout his body lately, which cause difficulty to walk and use her hand. The symptoms was quite severe last week, but it has improved significantly this week. He has recently stopped eating ice cream bars. He also has episodes 2 days after chemo where he feels extremely fatigued with no appetite that resolves in about 2 days. His pain level is more tolerable. On average 5-6/10 on a pain scale He is taking 4 oxycodone 4x day and Oxycontin 3x day.     MEDICAL HISTORY:  Past Medical History:  Diagnosis Date  . Cancer ([place])    liver  . Cholelithiasis   . Chronic hepatitis C ([place])   . Chronic knee pain   . Chronic left shoulder pain   . CVA (cerebral infarction)   . Gout   . Hepatitis C    genotype 1b.  pt has been vaccinated against hep A and B.  . Hypertension   . Osteoarthritis   . Schatzki's ring   . Tubular adenoma of colon 12/2011    SURGICAL HISTORY: Past Surgical History:  Procedure Laterality Date  . AMPUTATION Left 09/17/2012   Procedure: SMALL FINGER EXTENSOR TENDON REPAIR; METACARPAL LEVEL AMPUTATION RING FINGER; PROXIMAL PHALANX LEVEL AMPUTATION LONG FINGER;  Surgeon: Schuyler Amor, MD;  Location: [facility];  Service: Orthopedics;  Laterality: Left;  . Arm surgery     right/plate in arm  . COLONOSCOPY WITH ESOPHAGOGASTRODUODENOSCOPY (EGD)  12/30/2011   RMR: Noncritical Schatzki's ring;  Hiatal hernia, Tubular ADENOMA removed from splenic flexure, otherwise normal colonoscopy  . LEG SURGERY     left  . WOUND EXPLORATION Left 09/17/2012   Procedure: WOUND EXPLORATION;  Surgeon: Schuyler Amor, MD;  Location: [facility];  Service: Orthopedics;  Laterality: Left;    SOCIAL HISTORY: Social History   Social History  . Marital status: Single    Spouse name: N/A  . Number of children: 0  . Years of education: N/A   Occupational History  . disabled    Social History Main Topics  . Smoking status: Former Smoker    Packs/day: 0.50    Years: 40.00    Types: Cigarettes    Quit date: 01/19/2012  . Smokeless tobacco: Never Used     Comment: Smokes 1/2 pack of cigarettes daily  . Alcohol use No     Comment: HX 2 beers 3-4 days per week; QUIT DEC 2013  . Drug use: No     Comment: Hx cocaine yrs ago, Last marijuana OCT 2013  . Sexual activity: Not Currently    Partners: Female   Other Topics Concern  . Not on file   Social History  Narrative   Lives w/ significant other, [name]    FAMILY HISTORY: Family History  Problem Relation Age of Onset  . Cirrhosis Father 82  . Cancer Father     liver cancer   . Lung cancer Mother 43  . Colon cancer Neg Hx     ALLERGIES:  has No Known Allergies.  MEDICATIONS:  Current Outpatient Prescriptions  Medication Sig Dispense Refill  . ALPRAZolam (XANAX) 1 MG tablet Take 1 tablet (1 mg total) by mouth 3 (three) times daily as needed for anxiety. 90 tablet 0  . hydrALAZINE (APRESOLINE) 25 MG tablet Take 50 mg by mouth daily.     Marland Kitchen lactulose (CHRONULAC) 10 GM/15ML solution 30 g daily as needed for moderate constipation.     . megestrol (MEGACE) 40 MG/ML suspension Take 400 mg by mouth daily.     . Multiple Vitamins-Minerals (CENTRUM SILVER PO) Take 1 tablet by mouth daily.     Marland Kitchen oxyCODONE (OXYCONTIN) 80 mg 12 hr tablet Take 1 tablet (80 mg total) by mouth every 8 (eight) hours. 90 tablet 0  . oxycodone (ROXICODONE) 30 MG immediate release tablet Take 1 tablet (30 mg total) by mouth every 4 (four) hours as needed for pain. 120 tablet 0  . PARoxetine (PAXIL) 20 MG tablet Take 20 mg by mouth every morning.     .  prochlorperazine (COMPAZINE) 10 MG tablet Take 1 tablet (10 mg total) by mouth every 8 (eight) hours as needed for nausea or vomiting. 30 tablet 1  . rivaroxaban (XARELTO) 20 MG TABS tablet Take 1 tablet (20 mg total) by mouth daily with supper. 30 tablet 2   No current facility-administered medications for this visit.     REVIEW OF SYSTEMS:  Constitutional: Denies fevers, chills or abnormal night sweats Eyes: Denies blurriness of vision, double vision or watery eyes Ears, nose, mouth, throat, and face: Denies mucositis or sore throat Respiratory: Denies cough, dyspnea or wheezes Cardiovascular: Denies palpitation, chest discomfort or lower extremity swelling Gastrointestinal:  Denies nausea, heartburn or change in bowel habits,  Skin: Denies abnormal skin rashes Lymphatics: Denies new lymphadenopathy or easy bruising Neurological:Denies numbness, tingling or new weaknesses Behavioral/Psych: Mood is stable, no new changes Muscuoskeletal: (+) joint pain  All other systems were reviewed with the patient and are negative.  PHYSICAL EXAMINATION  ECOG PERFORMANCE STATUS: 2-3  Vitals:   04/30/16 1439  BP: 121/68  Pulse: 82  Resp: 18  Temp: 97.8 F (36.6 C)   Filed Weights   04/30/16 1439  Weight: 174 lb 4.8 oz (79.1 kg)    GENERAL:alert,  SKIN: skin color, texture, turgor are normal, no rashes or significant lesions EYES: normal, conjunctiva are pink and non-injected, sclera clear OROPHARYNX:no exudate, no erythema and lips, buccal mucosa, and tongue normal  NECK: supple, thyroid normal size, non-tender, without nodularity LYMPH:  no palpable lymphadenopathy in the cervical, axillary or inguinal LUNGS: clear to auscultation and percussion with normal breathing effort HEART: regular rate & rhythm and no murmurs and no lower extremity edema ABDOMEN:abdomen soft, non-tender and normal bowel sounds,  (+) abdominal bloating  Musculoskeletal:no cyanosis of digits and no clubbing    PSYCH: alert & oriented x 3 with fluent speech NEURO: no focal motor/sensory deficits  LABORATORY DATA:  I have reviewed the data as listed CBC Latest Ref Rng & Units 04/30/2016 04/16/2016 04/02/2016  WBC 4.0 - 10.3 10e3/uL 6.8 7.8 7.1  Hemoglobin 13.0 - 17.1 g/dL 10.4(L) 10.8(L) 11.2(L)  Hematocrit 38.4 - 49.9 %  30.3(L) 31.6(L) 33.5(L)  Platelets 140 - 400 10e3/uL 181 188 139(L)   CMP Latest Ref Rng & Units 04/30/2016 04/16/2016 04/02/2016  Glucose 70 - 140 mg/dl 141(H) 131 109  BUN 7.0 - 26.0 mg/dL 14.3 13.1 17.4  Creatinine 0.7 - 1.3 mg/dL 1.2 1.2 1.4(H)  Sodium 136 - 145 mEq/L 139 138 137  Potassium 3.5 - 5.1 mEq/L 4.1 3.4(L) 4.0  Chloride 101 - 111 mmol/L - - -  CO2 22 - 29 mEq/L 23 21(L) 21(L)  Calcium 8.4 - 10.4 mg/dL 9.3 9.3 9.7  Total Protein 6.4 - 8.3 g/dL 7.3 7.6 7.8  Total Bilirubin 0.20 - 1.20 mg/dL 0.40 0.53 0.63  Alkaline Phos 40 - 150 U/L 119 117 182(H)  AST 5 - 34 U/L 26 24 33  ALT 0 - 55 U/L '12 14 23   '$ AFP: 09/14/2012: 28.9 10/11/2015: 5.3 11/01/2015: 3.7 12/02/2015: 3.7 01/22/2016: 3.4 02/18/2016: 7.1 03/17/16: 5.8 04/16/16: 4.1  RADIOGRAPHIC STUDIES: I have personally reviewed the radiological images as listed and agreed with the findings in the report.  04/17/16 CT CAP W PELVIS IMPRESSION: 1. Stable mild mediastinal lymphadenopathy. 2. Interval improvement in the bibasilar peribronchial thickening, airway impaction, and peribronchovascular nodularity. Imaging features suggest marked improvement in bilateral low the atypical infection. Changes in the right middle lobe with more stable and may represent scarring from previous infectious/inflammatory etiology. 3. The small hypervascular lesions seen in the anterior left liver on the previous study are not evident today. There is some subtle, ill-defined hyperenhancement in the central left liver which is nonspecific. Attention to this area on followup imaging recommended. 4. Chronic portal vein inclusion with  cavernous transformation in the porta hepatis and paraesophageal varices. 5. Stable appearance hepatoduodenal and retroperitoneal lymphadenopathy, likely chronic and related to liver disease.  CT CAP w/ contrast 02/14/2016 IMPRESSION: 1. Slight interval increase in size of the mediastinal lymph nodes. 2. Persistent extensive lower lobe peribronchial thickening and patchy infiltrates. Possible aspiration pneumonia. 3. Persistent tree-in-bud appearance in the right middle lobe, likely chronic inflammation or atypical infection. 4. Improved CT appearance of the liver. Two small residual arterial phase enhancing lesions are identified. 5. Chronic portal vein thrombosis with cavernous transformation of portal venous collaterals. Esophageal varices are again noted. 6. Stable upper abdominal lymph nodes likely related to cirrhosis.  CT abdomen pelvis w/ contrast 01/25/2016 IMPRESSION: 1. Findings consistent with multifocal liver carcinoma with extensive portal vein thrombosis, and subsequent development of porta hepatis venous collaterals. Thrombus in the central portal vein is no longer expansile, but still expands the more peripheral portal vein and the right and left portal veins. There are few right liver bile ducts there are now dilated, which suggests mild progression of liver disease. 2. There is mild splenomegaly consistent or venous hypertension, which has mildly increased from the prior CT. 3. There are new lung abnormalities with interstitial thickening and ill-defined peribronchovascular nodular opacities mostly at the right lung base with a smaller area noted along the medial left lower lobe. The etiology may be infectious. It may be inflammatory, possibly related to chemotherapy. Neoplastic disease is also possible. 4. There is no other evidence of metastatic disease within the abdomen or pelvis. No acute findings within the abdomen or pelvis.  ASSESSMENT & PLAN: 66 y.o.  Caucasian male with past medical history of successfully treated hepatitis C, liver cirrhosis, history of ascites and encephalopathy, recurrent HCC  1. Recurrent HCC, initially stage II -I previously reviewed his previous image and outside medical records extensively, confirmed  key findings with patient and his family members -He initially had a multifocal disease, status post TACE 3 times in 2015 -he now has developed symptomatic local recurrence, with a 5cm new lesion in segment 8 with direct invasion into portal vein, and a portocaval lymph node. He is not a candidate for liver targeted therapy per his IR Dr. Delice Lesch -he is currently on first line systemic therapy with sorafenib, not able to tolerate full dose, doing well on 600 mg daily, will continue  -His previous CT abdomen and pelvis on 01/25/2016 revealed extensive portal hypertension, and a probable cancer progression in the liver. -His treatment has changed to immunotherapy Nivolumab, he tolerated very well, and clinically he is doing much better, with less pain and better energy. Continue.  -I previously discussed his restaging CT scan which was done on 02/14/2016, it actually showed decreased liver cancer size compared to the scan a month ago. No other new metastasis. -restaging CT from 04/17/2016 showed no visible liver mass, but the imaging was very limited due to his liver cirrhosis  -I will obtain a abdomen MRI to further evaluate his liver cancer -He has developed arthralgia, fatigue and anorexia after Nivolumab treatment. He would like to postpone his treatment today to the week, and change treatment to every 3 weeks if possible. Nivo every 4 weeks has been tested and FDA approved, but insurance has not approved. I think the Nivolumab effect on immune system is likely more than 2 weeks, I'll change his treatment to every 3 weeks from now on due to his tolerance issue.   2. Abdominal pain -Secondary to [facility] and portal vein  thrombosis -overall better now with oxycodone and OxyContin, he is on very high dose, overall pain is controlled  -continue OxyContin 80 mg every 8 hours, and oxycodone '30mg'$  every 3-4 hours. I will refilled Oxycodone today. -I previously recommended him to see pain management clinic, he declined. -he is taking oxycodone 6 pain pills a day. I previously encouraged him to continue the OxyContin, but to only take the oxycodone PRN.   3. Portal vein thrombosis -Worse on the previous CT scan. I think it probably contributes to his abdominal pain also -I previously recommended him to start anticoagulation. The options of Lovenox injection, Coumadin, and Xarelto were discussed with patient, he opted to Xarelto. 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 Hep C, successfully treated -Continue follow-up with liver clinic NP [facility]  6. Anxiety and insomnia  -He has Xanax for anxiety. I previously refilled Xanax 1 mg. -continue Ativan 1 mg at bedtime as needed for insomnia, he knows not to take with Xanax together, he knows not to take during the day. He does not feel Ativan is working well for sleep, he may take xanax for insomnia   7. Goal of care discussion, DNR/DNI  -We previously discussed the incurable nature of his cancer, and the overall poor prognosis, especially if he does not have good response to treatment -The patient understands the goal of care is palliative. -I recommend DNR/DNI,  he agrees   8. HTN -He has stopped taking his BP medication.  His BP today was 171/81 -He will monitor his BP at home. -I encourage him to follow up with PCP, and restart his BP meds   9. Smoking - Patient has started back smoking due to the fear of not getting better - I strongly recommended a smoking cessation program. He refused   10. Arthralgia -Patient has developed significant arthralgia, possible related to Nivolumab. It has much improved this week. Exam was unremarkable. - Patient takes OxyContin to help and oxycodone prn - I advised he can take up to ibuprofen 3 a day - If pain worsens, I may try a short course of steroids  Plan  - We'll postpone his Nivolumab from today to next week, and change his treatment to 240 mg every 3 weeks due to tolerance issue - order MRI in next 3-4 weeks, before next visit  -Lab, f/u and nivolumab in 4 weeks   All questions were answered. The patient knows to call the clinic with any problems, questions or concerns.  I spent 20 minutes counseling the patient face to face. The total time spent in the appointment was 30 minutes and more than 50% was on counseling.  This document serves as a record of services personally performed by Truitt Merle, MD. It was created on her behalf by Brandt Loosen, a trained medical scribe. The creation of this record is based on the scribe's personal observations and the provider's statements to them. This document has been checked and approved by the attending provider.  I have reviewed the above documentation for accuracy and completeness and I agree with the above.   Truitt Merle, MD 04/30/2016

## 2016-04-29 MED FILL — XARELTO 20 MG TABLET: 20 | 30 days supply | Qty: 30 | Fill #2

## 2016-04-30 ENCOUNTER — Encounter: Payer: Self-pay | Admitting: Hematology

## 2016-04-30 ENCOUNTER — Telehealth: Payer: Self-pay | Admitting: Hematology

## 2016-04-30 ENCOUNTER — Ambulatory Visit: Payer: Medicare Other

## 2016-04-30 ENCOUNTER — Other Ambulatory Visit (HOSPITAL_BASED_OUTPATIENT_CLINIC_OR_DEPARTMENT_OTHER): Payer: Medicare Other

## 2016-04-30 ENCOUNTER — Ambulatory Visit (HOSPITAL_BASED_OUTPATIENT_CLINIC_OR_DEPARTMENT_OTHER): Payer: Medicare Other | Admitting: Hematology

## 2016-04-30 VITALS — BP 121/68 | HR 82 | Temp 97.8°F | Resp 18 | Ht 73.0 in | Wt 174.3 lb

## 2016-04-30 DIAGNOSIS — Z7901 Long term (current) use of anticoagulants: Secondary | ICD-10-CM | POA: Diagnosis not present

## 2016-04-30 DIAGNOSIS — R109 Unspecified abdominal pain: Secondary | ICD-10-CM

## 2016-04-30 DIAGNOSIS — K7469 Other cirrhosis of liver: Secondary | ICD-10-CM | POA: Diagnosis not present

## 2016-04-30 DIAGNOSIS — C220 Liver cell carcinoma: Secondary | ICD-10-CM | POA: Diagnosis not present

## 2016-04-30 DIAGNOSIS — Z8619 Personal history of other infectious and parasitic diseases: Secondary | ICD-10-CM | POA: Diagnosis not present

## 2016-04-30 DIAGNOSIS — G4700 Insomnia, unspecified: Secondary | ICD-10-CM

## 2016-04-30 DIAGNOSIS — G893 Neoplasm related pain (acute) (chronic): Secondary | ICD-10-CM | POA: Diagnosis not present

## 2016-04-30 DIAGNOSIS — Z720 Tobacco use: Secondary | ICD-10-CM | POA: Diagnosis not present

## 2016-04-30 DIAGNOSIS — M2550 Pain in unspecified joint: Secondary | ICD-10-CM

## 2016-04-30 DIAGNOSIS — I81 Portal vein thrombosis: Secondary | ICD-10-CM | POA: Diagnosis not present

## 2016-04-30 DIAGNOSIS — F419 Anxiety disorder, unspecified: Secondary | ICD-10-CM | POA: Diagnosis not present

## 2016-04-30 LAB — CBC WITH DIFFERENTIAL/PLATELET
BASO%: 0.6 % (ref 0.0–2.0)
Basophils Absolute: 0 10*3/uL (ref 0.0–0.1)
EOS%: 3.2 % (ref 0.0–7.0)
Eosinophils Absolute: 0.2 10*3/uL (ref 0.0–0.5)
HEMATOCRIT: 30.3 % — AB (ref 38.4–49.9)
HGB: 10.4 g/dL — ABNORMAL LOW (ref 13.0–17.1)
LYMPH#: 2.2 10*3/uL (ref 0.9–3.3)
LYMPH%: 32.2 % (ref 14.0–49.0)
MCH: 29.8 pg (ref 27.2–33.4)
MCHC: 34.4 g/dL (ref 32.0–36.0)
MCV: 86.5 fL (ref 79.3–98.0)
MONO#: 0.6 10*3/uL (ref 0.1–0.9)
MONO%: 9.2 % (ref 0.0–14.0)
NEUT%: 54.8 % (ref 39.0–75.0)
NEUTROS ABS: 3.7 10*3/uL (ref 1.5–6.5)
PLATELETS: 181 10*3/uL (ref 140–400)
RBC: 3.5 10*6/uL — AB (ref 4.20–5.82)
RDW: 13.9 % (ref 11.0–14.6)
WBC: 6.8 10*3/uL (ref 4.0–10.3)

## 2016-04-30 LAB — COMPREHENSIVE METABOLIC PANEL
ALT: 12 U/L (ref 0–55)
ANION GAP: 9 meq/L (ref 3–11)
AST: 26 U/L (ref 5–34)
Albumin: 3.3 g/dL — ABNORMAL LOW (ref 3.5–5.0)
Alkaline Phosphatase: 119 U/L (ref 40–150)
BILIRUBIN TOTAL: 0.4 mg/dL (ref 0.20–1.20)
BUN: 14.3 mg/dL (ref 7.0–26.0)
CHLORIDE: 107 meq/L (ref 98–109)
CO2: 23 meq/L (ref 22–29)
CREATININE: 1.2 mg/dL (ref 0.7–1.3)
Calcium: 9.3 mg/dL (ref 8.4–10.4)
EGFR: 61 mL/min/{1.73_m2} — ABNORMAL LOW (ref >90)
GLUCOSE: 141 mg/dL — AB (ref 70–140)
Potassium: 4.1 mEq/L (ref 3.5–5.1)
Sodium: 139 mEq/L (ref 136–145)
TOTAL PROTEIN: 7.3 g/dL (ref 6.4–8.3)

## 2016-04-30 NOTE — Telephone Encounter (Signed)
Appointments scheduled per 4.12.18 LOS. Patient given AVS report and calendars with future scheduled appointments. °

## 2016-05-02 ENCOUNTER — Encounter: Payer: Self-pay | Admitting: Hematology

## 2016-05-08 ENCOUNTER — Other Ambulatory Visit: Payer: Self-pay | Admitting: Hematology

## 2016-05-08 ENCOUNTER — Ambulatory Visit (HOSPITAL_BASED_OUTPATIENT_CLINIC_OR_DEPARTMENT_OTHER): Payer: Medicare Other

## 2016-05-08 ENCOUNTER — Other Ambulatory Visit: Payer: Medicare Other

## 2016-05-08 VITALS — BP 163/83 | HR 87 | Temp 98.1°F | Resp 18

## 2016-05-08 DIAGNOSIS — Z5112 Encounter for antineoplastic immunotherapy: Secondary | ICD-10-CM | POA: Diagnosis present

## 2016-05-08 DIAGNOSIS — C220 Liver cell carcinoma: Secondary | ICD-10-CM

## 2016-05-08 MED ORDER — SODIUM CHLORIDE 0.9 % IV SOLN
Freq: Once | INTRAVENOUS | Status: AC
  Administered 2016-05-08: 11:00:00 via INTRAVENOUS

## 2016-05-08 MED ORDER — OXYCODONE HCL 30 MG PO TABS
30.0000 mg | ORAL_TABLET | ORAL | 0 refills | Status: DC | PRN
Start: 2016-05-08 — End: 2016-06-04

## 2016-05-08 MED ORDER — SODIUM CHLORIDE 0.9 % IV SOLN
240.0000 mg | Freq: Once | INTRAVENOUS | Status: AC
  Administered 2016-05-08: 240 mg via INTRAVENOUS
  Filled 2016-05-08: qty 24

## 2016-05-08 MED FILL — oxyCODONE HCL 30 MG TABS: 30 | 20 days supply | Qty: 120 | Fill #0

## 2016-05-08 NOTE — Patient Instructions (Signed)
[facility] Discharge Instructions for Patients Receiving Chemotherapy  Today you received the following chemotherapy agents:  Nivolumab.  To help prevent nausea and vomiting after your treatment, we encourage you to take your nausea medication as directed.   If you develop nausea and vomiting that is not controlled by your nausea medication, call the clinic.   BELOW ARE SYMPTOMS THAT SHOULD BE REPORTED IMMEDIATELY:  *FEVER GREATER THAN 100.5 F  *CHILLS WITH OR WITHOUT FEVER  NAUSEA AND VOMITING THAT IS NOT CONTROLLED WITH YOUR NAUSEA MEDICATION  *UNUSUAL SHORTNESS OF BREATH  *UNUSUAL BRUISING OR BLEEDING  TENDERNESS IN MOUTH AND THROAT WITH OR WITHOUT PRESENCE OF ULCERS  *URINARY PROBLEMS  *BOWEL PROBLEMS  UNUSUAL RASH Items with * indicate a potential emergency and should be followed up as soon as possible.  Feel free to call the clinic you have any questions or concerns. The clinic phone number is [phone].  Please show the CHEMO ALERT CARD at check-in to the Emergency Department and triage nurse.   

## 2016-05-12 ENCOUNTER — Telehealth: Payer: Self-pay | Admitting: *Deleted

## 2016-05-12 NOTE — Telephone Encounter (Signed)
Received call from pt's wife asking about appt.  She reports that they received a reminder call for appt 05/14/16.  She didn't think that pt needed this appt b/c last visit with Dr Burr Medico she wanted to change to every 3 weeks for chemo.  Reviewed chart & 05/14/16 appts cancelled.  Will have lab r/s to 05/28/16 & cancel 05/21/16 lab.

## 2016-05-14 ENCOUNTER — Other Ambulatory Visit: Payer: Self-pay | Admitting: Hematology

## 2016-05-14 ENCOUNTER — Ambulatory Visit: Payer: Medicare Other

## 2016-05-14 ENCOUNTER — Other Ambulatory Visit: Payer: Medicare Other

## 2016-05-14 ENCOUNTER — Telehealth: Payer: Self-pay | Admitting: *Deleted

## 2016-05-14 DIAGNOSIS — C220 Liver cell carcinoma: Secondary | ICD-10-CM

## 2016-05-14 NOTE — Telephone Encounter (Signed)
Received call from wife [name] requesting refills of  Oxycontin and Xanax.

## 2016-05-15 DIAGNOSIS — K7469 Other cirrhosis of liver: Secondary | ICD-10-CM | POA: Diagnosis not present

## 2016-05-15 DIAGNOSIS — C220 Liver cell carcinoma: Secondary | ICD-10-CM | POA: Diagnosis not present

## 2016-05-15 MED FILL — ALPRAZolam 1 MG TABS: 1 | 30 days supply | Qty: 90 | Fill #0

## 2016-05-15 MED FILL — OxyCONTIN 80 MG T12A: 80 | 30 days supply | Qty: 90 | Fill #0

## 2016-05-18 ENCOUNTER — Telehealth: Payer: Self-pay | Admitting: Hematology

## 2016-05-18 NOTE — Telephone Encounter (Signed)
Confirmed r/s lab appt to 5/10 at 0800 per LOS

## 2016-05-21 ENCOUNTER — Other Ambulatory Visit: Payer: Medicare Other

## 2016-05-21 ENCOUNTER — Ambulatory Visit (HOSPITAL_COMMUNITY)
Admission: RE | Admit: 2016-05-21 | Discharge: 2016-05-21 | Disposition: A | Discharge status: Home / Self Care | Payer: Medicare Other | Source: Ambulatory Visit | Attending: Hematology | Admitting: Hematology

## 2016-05-21 DIAGNOSIS — K7460 Unspecified cirrhosis of liver: Secondary | ICD-10-CM | POA: Insufficient documentation

## 2016-05-21 DIAGNOSIS — I81 Portal vein thrombosis: Secondary | ICD-10-CM | POA: Insufficient documentation

## 2016-05-21 DIAGNOSIS — I864 Gastric varices: Secondary | ICD-10-CM | POA: Insufficient documentation

## 2016-05-21 DIAGNOSIS — R161 Splenomegaly, not elsewhere classified: Secondary | ICD-10-CM | POA: Diagnosis not present

## 2016-05-21 DIAGNOSIS — C220 Liver cell carcinoma: Secondary | ICD-10-CM | POA: Diagnosis not present

## 2016-05-21 DIAGNOSIS — I8500 Esophageal varices without bleeding: Secondary | ICD-10-CM | POA: Diagnosis not present

## 2016-05-21 DIAGNOSIS — R590 Localized enlarged lymph nodes: Secondary | ICD-10-CM | POA: Insufficient documentation

## 2016-05-21 MED ORDER — GADOXETATE DISODIUM 0.25 MMOL/ML IV SOLN
10.0000 mL | Freq: Once | INTRAVENOUS | Status: AC | PRN
  Administered 2016-05-21: 7 mL via INTRAVENOUS

## 2016-05-28 ENCOUNTER — Telehealth: Payer: Self-pay | Admitting: *Deleted

## 2016-05-28 ENCOUNTER — Other Ambulatory Visit: Payer: Medicare Other

## 2016-05-28 ENCOUNTER — Ambulatory Visit: Payer: Medicare Other

## 2016-05-28 ENCOUNTER — Ambulatory Visit: Payer: Medicare Other | Admitting: Hematology

## 2016-05-28 NOTE — Telephone Encounter (Signed)
Received call from [facility], sig other stating that pt was unable to make appt this am due to being sick.  She reports that he had chills & was cold & felt bad.  She did not check temp. Encouraged to check temp if this happens again.  She reports this pm that pt has been up & walking & is eating some & drinking.  Per Dr Burr Medico, LOS sent to have pt return for appt next week.  [name] reports that she did call early this am to report pt unable to come.

## 2016-05-28 NOTE — Telephone Encounter (Signed)
Left message to return call regarding missed appts today.

## 2016-05-29 ENCOUNTER — Telehealth: Payer: Self-pay | Admitting: Hematology

## 2016-05-29 NOTE — Telephone Encounter (Signed)
Confirmed r/s appt 5/17 at 1 pm with Pat per sch msg

## 2016-06-03 NOTE — Progress Notes (Signed)
[place]  [phone] [phone]  Clinic Follow up Note   Patient Care Team: Antonietta Jewel, MD as PCP - General (Internal Medicine) [name], Cristopher Estimable, MD as Attending Physician (Gastroenterology) Antonietta Jewel, MD as Referring Physician (Internal Medicine) Truitt Merle, MD as Consulting Physician (Hematology) [name], CRNP as Nurse Practitioner (Nurse Practitioner) 06/04/2016   CHIEF COMPLAINTS:  Follow up recurrent [place]   Oncology History   Hepatocellular carcinoma [facility])   Staging form: Liver (Excluding Intrahepatic Bile Ducts), AJCC 7th Edition   - Clinical stage from 04/16/2012: Stage II (T2(m), N0, M0) - Signed by Truitt Merle, MD on 10/12/2015   - Pathologic stage from 04/16/2013: Stage II (T2, N0, cM0) - Signed by Truitt Merle, MD on 10/12/2015      Hepatocellular carcinoma ([place])   05/1927 Imaging         09/14/2012 Tumor Marker    AFP 28.9      04/16/2013 Imaging    Abdominal MRI with and without contrast reviewed multifocal (4) liver lesions in both left and right lobes, most consistent with HCC, largest measuring 2.7 cm      04/16/2013 Initial Diagnosis    Hepatocellular carcinoma ([place])      04/28/2013 Procedure    Right TACE with lipiodol       06/15/2013 Procedure    Left TACE with [facility]       07/14/2013 Procedure    Right TACE with [facility]      09/05/2015 Imaging    CT abdomen with and without contrast showed a new 5.0 x 2.3 cm mass in the hepatic segment 8, invading and occluding the right anterior portal vein, most compatible with [place]. A portacaval node measuring 3.0 x 1.4 cm, previously 2.9 x 1.1 cm.      10/11/2015 Tumor Marker    AFP 5.3      11/11/2015 - 02/03/2016 Chemotherapy    sorafenib '200mg'$  bid started on 11/11/2015, increased to '400mg'$  bid in 2 weeks, stopped due to poor tolerance and probable disease progression       11/14/2015 Imaging    CT CAP 11/14/2015 IMPRESSION: Chest Impression: 1. RIGHT upper lobe pulmonary  nodule is indeterminate. No comparison available. 2. Ground-glass opacity and peripheral nodules in the RIGHT middle lobe appear post infectious or inflammatory.  Abdomen / Pelvis Impression: 1. 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 Thrombus also likely within the LEFT and RIGHT portal vein. Difficult to distinguish tumor thrombus versus bland thrombus. 2. Individual lesions are difficult to define in the RIGHT hepatic lobe. There is overall impression of progression of disease in the RIGHT hepatic lobe with multiple ill-defined enhancing lesions. 3. Periportal and shotty retroperitoneal adenopathy similar to prior.      01/25/2016 Imaging    CT Abdomen Pelvis w/ Contrast IMPRESSION: 1. Findings consistent with multifocal liver carcinoma with extensive portal vein thrombosis, and subsequent development of porta hepatis venous collaterals. Thrombus in the central portal vein is no longer expansile, but still expands the more peripheral portal vein and the right and left portal veins. There are few right liver bile ducts there are now dilated, which suggests mild progression of liver disease. 2. There is mild splenomegaly consistent or venous hypertension, which has mildly increased from the prior CT. 3. There are new lung abnormalities with interstitial thickening and ill-defined peribronchovascular nodular opacities mostly at the right lung base with a  smaller area noted along the medial left lower lobe. The etiology may be infectious. It may be inflammatory, possibly related to chemotherapy. Neoplastic disease is also possible. 4. There is no other evidence of metastatic disease within the abdomen or pelvis. No acute findings within the abdomen or pelvis      01/25/2016 - 01/25/2016 Hospital Admission    Pt was seen in ED for worsening abdominal pain, treated with pain meds        02/04/2016 -  Chemotherapy    The patient started on immunotherapy treatment nivolumab every 2 weeks; on 04/30/16 dose was reduced to '420mg'$  every 3 weeks       02/14/2016 Imaging    CT CAP w/ contrast 1. Slight interval increase in size of the mediastinal lymph nodes. 2. Persistent extensive lower lobe peribronchial thickening and patchy infiltrates. Possible aspiration pneumonia. 3. Persistent tree-in-bud appearance in the right middle lobe, likely chronic inflammation or atypical infection. 4. Improved CT appearance of the liver. Two small residual arterial phase enhancing lesions are identified. 5. Chronic portal vein thrombosis with cavernous transformation of portal venous collaterals. Esophageal varices are again noted. 6. Stable upper abdominal lymph nodes likely related to cirrhosis.       04/16/2016 Tumor Marker    AFP 4.1       04/17/2016 Imaging     CT CAP W PELVIS IMPRESSION: 1. Stable mild mediastinal lymphadenopathy. 2. Interval improvement in the bibasilar peribronchial thickening, airway impaction, and peribronchovascular nodularity. Imaging features suggest marked improvement in bilateral low the atypical infection. Changes in the right middle lobe with more stable and may represent scarring from previous infectious/inflammatory etiology. 3. The small hypervascular lesions seen in the anterior left liver on the previous study are not evident today. There is some subtle, ill-defined hyperenhancement in the central left liver which is nonspecific. Attention to this area on followup imaging recommended. 4. Chronic portal vein inclusion with cavernous transformation in the porta hepatis and paraesophageal varices. 5. Stable appearance hepatoduodenal and retroperitoneal lymphadenopathy, likely chronic and related to liver disease.      04/17/2016 Imaging    CT CAP CONTRAST IMPRESSION: 1. Stable mild mediastinal lymphadenopathy. 2. Interval improvement in the bibasilar  peribronchial thickening, airway impaction, and peribronchovascular nodularity. Imaging features suggest marked improvement in bilateral low the atypical infection. Changes in the right middle lobe with more stable and may represent scarring from previous infectious/inflammatory etiology. 3. The small hypervascular lesions seen in the anterior left liver on the previous study are not evident today. There is some subtle, ill-defined hyperenhancement in the central left liver which is nonspecific. Attention to this area on followup imaging recommended. 4. Chronic portal vein inclusion with cavernous transformation in the porta hepatis and paraesophageal varices. 5. Stable appearance hepatoduodenal and retroperitoneal lymphadenopathy, likely chronic and related to liver disease.      05/21/2016 Imaging    MR Abdomen W WO Contrast IMPRESSION: 1. Limited motion degraded scan. Cirrhosis. No evidence of a liver mass within these limitations. 2. Mild splenomegaly. No ascites. Stable mild paraumbilical and gastroesophageal varices. 3. Stable nonspecific mild retroperitoneal lymphadenopathy. 4. 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       HISTORY OF PRESENTING ILLNESS (10/11/2015):  Kevin Dillon 66 y.o. male with past medical history of treated hepatitis C, liver cirrhosis, and hepatocellular carcinoma is here because of recurrent hepatocellular carcinoma. He is accompanied by his significant other [name] and her sister.  He was diagnosed with  hepatocellular carcinoma in 2015, his initial image result is not available and staging is unknown. He was seen by interventional radiologist Dr. Delice Lesch at [facility] in [place] and underwent TACE procedure 3 times in 2015. He was subsequently followed, and recently repeated CT scan showed a new 5.0 cm mass in segment 8, with portal vein invasion. He was felt not to be a candidate for further  liver targeted therapy, and was referred to [place] for further systemic therapy.  He complains about mid epigastric pain for the past 2 years, which significantly improved after his TACE procedure in 2015. It has been getting worse lately in the past 6 months. He states he gets about 7-8 out of 10, persistent, he has been taking oxycodone 20 mg every 6 hours, but his pain is not well controlled. He is quite fatigued, is low appetite. He last 40 lbs in the pat 4 months. He is able to function at home, in trying to remain to be physically active.  His hepatitis C was successfully treated. He has history of hepatitis C, complicated with ascites and encephalopathy, but it has been well controlled with medical management.   CURRENT THERAPY: Nivolumab '240mg'$  every 2 weeks, started on 02/04/2015; on 04/30/16 switched to every 3 weeks due to fatigue  INTERIM HISTORY: Kevin Dillon returns for follow up and review of Abdominal MRI. MRI from 05/21/16 showed excellent response to therapy.  The patient reports he is doing well today. He reports that recently he has experienced abdominal discomfort, which he attributes to his treatments. He thinks this has improved somewhat since treatments were spaced 3 weeks apart instead of 2. He reports ongoing fatigue. He reports feelings of depression related to his diagnosis.     MEDICAL HISTORY:  Past Medical History:  Diagnosis Date  . Cancer ([place])    liver  . Cholelithiasis   . Chronic hepatitis C ([place])   . Chronic knee pain   . Chronic left shoulder pain   . CVA (cerebral infarction)   . Gout   . Hepatitis C    genotype 1b.  pt has been vaccinated against hep A and B.  . Hypertension   . Osteoarthritis   . Schatzki's ring   . Tubular adenoma of colon 12/2011    SURGICAL HISTORY: Past Surgical History:  Procedure Laterality Date  . AMPUTATION Left 09/17/2012   Procedure: SMALL FINGER EXTENSOR TENDON REPAIR; METACARPAL LEVEL AMPUTATION RING FINGER; PROXIMAL PHALANX  LEVEL AMPUTATION LONG FINGER;  Surgeon: Schuyler Amor, MD;  Location: [place] [facility];  Service: Orthopedics;  Laterality: Left;  . Arm surgery     right/plate in arm  . COLONOSCOPY WITH ESOPHAGOGASTRODUODENOSCOPY (EGD)  12/30/2011   RMR: Noncritical Schatzki's ring;  Hiatal hernia, Tubular ADENOMA removed from splenic flexure, otherwise normal colonoscopy  . LEG SURGERY     left  . WOUND EXPLORATION Left 09/17/2012   Procedure: WOUND EXPLORATION;  Surgeon: Schuyler Amor, MD;  Location: [place];  Service: Orthopedics;  Laterality: Left;    SOCIAL HISTORY: Social History   Social History  . Marital status: Single    Spouse name: N/A  . Number of children: 0  . Years of education: N/A   Occupational History  . disabled    Social History Main Topics  . Smoking status: Former Smoker    Packs/day: 0.50    Years: 40.00    Types: Cigarettes    Quit date: 01/19/2012  . Smokeless tobacco: Never Used  Comment: Smokes 1/2 pack of cigarettes daily  . Alcohol use No     Comment: HX 2 beers 3-4 days per week; QUIT DEC 2013  . Drug use: No     Comment: Hx cocaine yrs ago, Last marijuana OCT 2013  . Sexual activity: Not Currently    Partners: Female   Other Topics Concern  . Not on file   Social History Narrative   Lives w/ significant other, [name]    FAMILY HISTORY: Family History  Problem Relation Age of Onset  . Cirrhosis Father 15  . Cancer Father        liver cancer   . Lung cancer Mother 39  . Colon cancer Neg Hx     ALLERGIES:  has No Known Allergies.  MEDICATIONS:  Current Outpatient Prescriptions  Medication Sig Dispense Refill  . ALPRAZolam (XANAX) 1 MG tablet Take 1 tablet (1 mg total) by mouth 3 (three) times daily as needed for anxiety. 90 tablet 0  . hydrALAZINE (APRESOLINE) 25 MG tablet Take 50 mg by mouth daily.     Marland Kitchen lactulose (CHRONULAC) 10 GM/15ML solution 30 g daily as needed for moderate constipation.     . megestrol (MEGACE) 40 MG/ML  suspension Take 400 mg by mouth daily.     . Multiple Vitamins-Minerals (CENTRUM SILVER PO) Take 1 tablet by mouth daily.     Marland Kitchen oxyCODONE (OXYCONTIN) 80 mg 12 hr tablet Take 1 tablet (80 mg total) by mouth every 8 (eight) hours. 90 tablet 0  . oxycodone (ROXICODONE) 30 MG immediate release tablet Take 1 tablet (30 mg total) by mouth every 4 (four) hours as needed for pain. 120 tablet 0  . PARoxetine (PAXIL) 20 MG tablet Take 20 mg by mouth every morning.     . prochlorperazine (COMPAZINE) 10 MG tablet Take 1 tablet (10 mg total) by mouth every 8 (eight) hours as needed for nausea or vomiting. 30 tablet 1  . rivaroxaban (XARELTO) 20 MG TABS tablet Take 1 tablet (20 mg total) by mouth daily with supper. 30 tablet 2   No current facility-administered medications for this visit.    Facility-Administered Medications Ordered in Other Visits  Medication Dose Route Frequency Provider Last Rate Last Dose  . 0.9 %  sodium chloride infusion   Intravenous Once Truitt Merle, MD      . nivolumab (OPDIVO) 240 mg in sodium chloride 0.9 % 100 mL chemo infusion  240 mg Intravenous Once Truitt Merle, MD        REVIEW OF SYSTEMS:  Constitutional: Denies fevers, chills or abnormal night sweats (+) fatigue Eyes: Denies blurriness of vision, double vision or watery eyes Ears, nose, mouth, throat, and face: Denies mucositis or sore throat Respiratory: Denies cough, dyspnea or wheezes Cardiovascular: Denies palpitation, chest discomfort or lower extremity swelling Gastrointestinal:  Denies nausea, heartburn or change in bowel habits,  Skin: Denies abnormal skin rashes Lymphatics: Denies new lymphadenopathy or easy bruising Neurological:Denies numbness, tingling or new weaknesses Behavioral/Psych: (+) patient reports depression Muscuoskeletal: (+) joint pain  All other systems were reviewed with the patient and are negative.  PHYSICAL EXAMINATION  ECOG PERFORMANCE STATUS: 2  Vitals:   06/04/16 1335  BP: (!)  162/92  Pulse: 83  Resp: 18  Temp: 97.9 F (36.6 C)   Filed Weights   06/04/16 1335  Weight: 178 lb 12.8 oz (81.1 kg)    GENERAL:alert,  SKIN: skin color, texture, turgor are normal, no rashes or significant lesions EYES: normal, conjunctiva  are pink and non-injected, sclera clear OROPHARYNX:no exudate, no erythema and lips, buccal mucosa, and tongue normal  NECK: supple, thyroid normal size, non-tender, without nodularity LYMPH:  no palpable lymphadenopathy in the cervical, axillary or inguinal LUNGS: clear to auscultation and percussion with normal breathing effort HEART: regular rate & rhythm and no murmurs and no lower extremity edema ABDOMEN:abdomen soft, non-tender and normal bowel sounds,  (+) abdominal bloating  Musculoskeletal:no cyanosis of digits and no clubbing  PSYCH: alert & oriented x 3 with fluent speech NEURO: no focal motor/sensory deficits  LABORATORY DATA:  I have reviewed the data as listed CBC Latest Ref Rng & Units 06/04/2016 04/30/2016 04/16/2016  WBC 4.0 - 10.3 10e3/uL 6.4 6.8 7.8  Hemoglobin 13.0 - 17.1 g/dL 11.4(L) 10.4(L) 10.8(L)  Hematocrit 38.4 - 49.9 % 33.2(L) 30.3(L) 31.6(L)  Platelets 140 - 400 10e3/uL 156 181 188   CMP Latest Ref Rng & Units 06/04/2016 04/30/2016 04/16/2016  Glucose 70 - 140 mg/dl 103 141(H) 131  BUN 7.0 - 26.0 mg/dL 14.3 14.3 13.1  Creatinine 0.7 - 1.3 mg/dL 1.4(H) 1.2 1.2  Sodium 136 - 145 mEq/L 139 139 138  Potassium 3.5 - 5.1 mEq/L 3.8 4.1 3.4(L)  Chloride 101 - 111 mmol/L - - -  CO2 22 - 29 mEq/L 23 23 21(L)  Calcium 8.4 - 10.4 mg/dL 9.6 9.3 9.3  Total Protein 6.4 - 8.3 g/dL 7.8 7.3 7.6  Total Bilirubin 0.20 - 1.20 mg/dL 0.44 0.40 0.53  Alkaline Phos 40 - 150 U/L 103 119 117  AST 5 - 34 U/L '24 26 24  '$ ALT 0 - 55 U/L '13 12 14   '$ AFP: 09/14/2012: 28.9 10/11/2015: 5.3 11/01/2015: 3.7 12/02/2015: 3.7 01/22/2016: 3.4 02/18/2016: 7.1 03/17/16: 5.8 04/16/16: 4.1  RADIOGRAPHIC STUDIES: I have personally reviewed the  radiological images as listed and agreed with the findings in the report.  04/17/16 CT CAP W PELVIS IMPRESSION: 1. Stable mild mediastinal lymphadenopathy. 2. Interval improvement in the bibasilar peribronchial thickening, airway impaction, and peribronchovascular nodularity. Imaging features suggest marked improvement in bilateral low the atypical infection. Changes in the right middle lobe with more stable and may represent scarring from previous infectious/inflammatory etiology. 3. The small hypervascular lesions seen in the anterior left liver on the previous study are not evident today. There is some subtle, ill-defined hyperenhancement in the central left liver which is nonspecific. Attention to this area on followup imaging recommended. 4. Chronic portal vein inclusion with cavernous transformation in the porta hepatis and paraesophageal varices. 5. Stable appearance hepatoduodenal and retroperitoneal lymphadenopathy, likely chronic and related to liver disease.  CT CAP w/ contrast 02/14/2016 IMPRESSION: 1. Slight interval increase in size of the mediastinal lymph nodes. 2. Persistent extensive lower lobe peribronchial thickening and patchy infiltrates. Possible aspiration pneumonia. 3. Persistent tree-in-bud appearance in the right middle lobe, likely chronic inflammation or atypical infection. 4. Improved CT appearance of the liver. Two small residual arterial phase enhancing lesions are identified. 5. Chronic portal vein thrombosis with cavernous transformation of portal venous collaterals. Esophageal varices are again noted. 6. Stable upper abdominal lymph nodes likely related to cirrhosis.  CT abdomen pelvis w/ contrast 01/25/2016 IMPRESSION: 1. Findings consistent with multifocal liver carcinoma with extensive portal vein thrombosis, and subsequent development of porta hepatis venous collaterals. Thrombus in the central portal vein is no longer expansile, but still  expands the more peripheral portal vein and the right and left portal veins. There are few right liver bile ducts there are now dilated, which suggests  mild progression of liver disease. 2. There is mild splenomegaly consistent or venous hypertension, which has mildly increased from the prior CT. 3. There are new lung abnormalities with interstitial thickening and ill-defined peribronchovascular nodular opacities mostly at the right lung base with a smaller area noted along the medial left lower lobe. The etiology may be infectious. It may be inflammatory, possibly related to chemotherapy. Neoplastic disease is also possible. 4. There is no other evidence of metastatic disease within the abdomen or pelvis. No acute findings within the abdomen or pelvis.  MR Abdomen W WO Contrast 05/21/16 IMPRESSION: 1. Limited motion degraded scan. Cirrhosis. No evidence of a liver mass within these limitations. 2. Mild splenomegaly. No ascites. Stable mild paraumbilical and gastroesophageal varices. 3. Stable nonspecific mild retroperitoneal lymphadenopathy. 4. 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  ASSESSMENT & PLAN: 66 y.o. Caucasian male with past medical history of successfully treated hepatitis C, liver cirrhosis, history of ascites and encephalopathy, recurrent HCC  1. Recurrent HCC, initially stage II -I previously reviewed his previous image and outside medical records extensively, confirmed key findings with patient and his family members -He initially had a multifocal disease, status post TACE 3 times in 2015 -he now has developed symptomatic local recurrence, with a 5cm new lesion in segment 8 with direct invasion into portal vein, and a portocaval lymph node. He is not a candidate for liver targeted therapy per his IR Dr. Delice Lesch -he is currently on first line systemic therapy with sorafenib, not able to tolerate full dose, doing well on  600 mg daily, will continue  -His previous CT abdomen and pelvis on 01/25/2016 revealed extensive portal hypertension, and a probable cancer progression in the liver. -His treatment has changed to immunotherapy Nivolumab, he tolerated very well, and clinically he is doing much better, with less pain and better energy. Continue.  -I previously discussed his restaging CT scan which was done on 02/14/2016, it actually showed decreased liver cancer size compared to the scan a month ago. No other new metastasis. -restaging CT from 04/17/2016 showed no visible liver mass, but the imaging was very limited due to his liver cirrhosis  -I reviewed her recent abdominal MRI from 05/21/2016, which showed no visible mass in the liver. He has had complete radiographic response. -Nivo has been changed to every 3 weeks, per pt's request due to fatigue and other side effects -his overall clinically doing well, we'll continue treatment.  2. Abdominal pain -Secondary to [facility] and portal vein thrombosis -overall better now with oxycodone and OxyContin, he is on very high dose, overall pain is controlled  -I previously recommended him to see pain management clinic, he declined. -he is taking oxycodone 6 pain pills a day. I previously encouraged him to continue the OxyContin, but to only take the oxycodone PRN.  - Patient currently takes OxyContin 3 times daily, and reports it has helped him to most. - I will refill oxycodone and OxyContin today.  3. 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 Hep C, successfully treated -Continue follow-up with liver clinic NP [facility]  6. Anxiety and insomnia  -He has Xanax for anxiety. I previously refilled Xanax 1 mg. -continue Ativan 1 mg at bedtime as needed for insomnia, he knows not to take with Xanax together, he knows not to take during the day. He does not feel Ativan is working well for sleep, he may take xanax for insomnia   7. Goal of care discussion, DNR/DNI  -We previously discussed the incurable nature of his cancer, and the overall poor prognosis, especially if he does not have good response to treatment -The patient understands the goal of care is palliative. -I recommend DNR/DNI, he agrees   8. HTN -He has stopped taking his BP medication. His BP today is elevated  -He will monitor his BP at home. -I previously encourage him to follow up with PCP, and restart his BP meds   9. Smoking - Patient has started back smoking due to the fear of not getting better - I strongly recommended a smoking cessation program. He refused.  10. Arthralgia -Patient has developed significant arthralgia, possible related to Nivolumab. It has much improved this week. Exam was unremarkable. - Patient takes OxyContin to help and oxycodone prn - I advised he can take up to ibuprofen 3 a day - If pain worsens, I may try a short course of steroids  11. Depression - The patient reports feelings of depression related to his diagnosis. - He reports fears he is going to die soon. - I encouraged the patient that his recent scan shows an excellent response to treatment.  Plan  - Recent MRI reviewed with the patient today, he has had complete into graphic response. - Proceed with Nivolumab infusion today, and every 3 weeks. - Refill oxycodone today. Refill for OxyContin given today, but not available until 06/14/16. - Follow up in 3 weeks with labs and Nivolumab infusion.   All questions were answered. The patient  knows to call the clinic with any problems, questions or concerns.  I spent 20 minutes counseling the patient face to face. The total time spent in the appointment was 30 minutes and more than 50% was on counseling.  This document serves as a record of services personally performed by Truitt Merle, MD. It was created on her behalf by Maryla Morrow, a trained medical scribe. The creation of this record is based on the scribe's personal observations and the provider's statements to them. This document has been checked and approved by the attending provider.  I have reviewed the above documentation for accuracy and completeness and I agree with the above.   Truitt Merle, MD 06/04/2016

## 2016-06-04 ENCOUNTER — Ambulatory Visit (HOSPITAL_BASED_OUTPATIENT_CLINIC_OR_DEPARTMENT_OTHER): Payer: Medicare Other | Admitting: Hematology

## 2016-06-04 ENCOUNTER — Ambulatory Visit (HOSPITAL_BASED_OUTPATIENT_CLINIC_OR_DEPARTMENT_OTHER): Payer: Medicare Other

## 2016-06-04 ENCOUNTER — Other Ambulatory Visit (HOSPITAL_BASED_OUTPATIENT_CLINIC_OR_DEPARTMENT_OTHER): Payer: Medicare Other

## 2016-06-04 VITALS — BP 162/92 | HR 83 | Temp 97.9°F | Resp 18 | Ht 73.0 in | Wt 178.8 lb

## 2016-06-04 DIAGNOSIS — K7469 Other cirrhosis of liver: Secondary | ICD-10-CM | POA: Diagnosis not present

## 2016-06-04 DIAGNOSIS — R109 Unspecified abdominal pain: Secondary | ICD-10-CM

## 2016-06-04 DIAGNOSIS — Z5112 Encounter for antineoplastic immunotherapy: Secondary | ICD-10-CM | POA: Diagnosis present

## 2016-06-04 DIAGNOSIS — Z79899 Other long term (current) drug therapy: Secondary | ICD-10-CM | POA: Diagnosis not present

## 2016-06-04 DIAGNOSIS — C220 Liver cell carcinoma: Secondary | ICD-10-CM

## 2016-06-04 DIAGNOSIS — G893 Neoplasm related pain (acute) (chronic): Secondary | ICD-10-CM | POA: Diagnosis not present

## 2016-06-04 DIAGNOSIS — F419 Anxiety disorder, unspecified: Secondary | ICD-10-CM | POA: Diagnosis not present

## 2016-06-04 DIAGNOSIS — Z8619 Personal history of other infectious and parasitic diseases: Secondary | ICD-10-CM

## 2016-06-04 DIAGNOSIS — I81 Portal vein thrombosis: Secondary | ICD-10-CM

## 2016-06-04 DIAGNOSIS — Z7901 Long term (current) use of anticoagulants: Secondary | ICD-10-CM | POA: Diagnosis not present

## 2016-06-04 DIAGNOSIS — G4700 Insomnia, unspecified: Secondary | ICD-10-CM | POA: Diagnosis not present

## 2016-06-04 LAB — CBC WITH DIFFERENTIAL/PLATELET
BASO%: 0.7 % (ref 0.0–2.0)
Basophils Absolute: 0 10*3/uL (ref 0.0–0.1)
EOS ABS: 0.2 10*3/uL (ref 0.0–0.5)
EOS%: 3.1 % (ref 0.0–7.0)
HCT: 33.2 % — ABNORMAL LOW (ref 38.4–49.9)
HGB: 11.4 g/dL — ABNORMAL LOW (ref 13.0–17.1)
LYMPH%: 32.6 % (ref 14.0–49.0)
MCH: 29.6 pg (ref 27.2–33.4)
MCHC: 34.3 g/dL (ref 32.0–36.0)
MCV: 86.2 fL (ref 79.3–98.0)
MONO#: 0.6 10*3/uL (ref 0.1–0.9)
MONO%: 9 % (ref 0.0–14.0)
NEUT#: 3.5 10*3/uL (ref 1.5–6.5)
NEUT%: 54.6 % (ref 39.0–75.0)
PLATELETS: 156 10*3/uL (ref 140–400)
RBC: 3.85 10*6/uL — ABNORMAL LOW (ref 4.20–5.82)
RDW: 13.9 % (ref 11.0–14.6)
WBC: 6.4 10*3/uL (ref 4.0–10.3)
lymph#: 2.1 10*3/uL (ref 0.9–3.3)

## 2016-06-04 LAB — TSH: TSH: 2.08 m(IU)/L (ref 0.320–4.118)

## 2016-06-04 LAB — COMPREHENSIVE METABOLIC PANEL
ALK PHOS: 103 U/L (ref 40–150)
ALT: 13 U/L (ref 0–55)
AST: 24 U/L (ref 5–34)
Albumin: 3.8 g/dL (ref 3.5–5.0)
Anion Gap: 9 mEq/L (ref 3–11)
BUN: 14.3 mg/dL (ref 7.0–26.0)
CHLORIDE: 107 meq/L (ref 98–109)
CO2: 23 mEq/L (ref 22–29)
Calcium: 9.6 mg/dL (ref 8.4–10.4)
Creatinine: 1.4 mg/dL — ABNORMAL HIGH (ref 0.7–1.3)
EGFR: 54 mL/min/{1.73_m2} — AB (ref >90)
GLUCOSE: 103 mg/dL (ref 70–140)
POTASSIUM: 3.8 meq/L (ref 3.5–5.1)
SODIUM: 139 meq/L (ref 136–145)
Total Bilirubin: 0.44 mg/dL (ref 0.20–1.20)
Total Protein: 7.8 g/dL (ref 6.4–8.3)

## 2016-06-04 MED ORDER — SODIUM CHLORIDE 0.9 % IV SOLN
Freq: Once | INTRAVENOUS | Status: AC
  Administered 2016-06-04: 14:00:00 via INTRAVENOUS

## 2016-06-04 MED ORDER — OXYCODONE HCL 30 MG PO TABS: 30.0000 mg | ORAL_TABLET | ORAL | 0 refills | Status: DC | PRN

## 2016-06-04 MED ORDER — OXYCODONE HCL ER 80 MG PO T12A: 80.0000 mg | EXTENDED_RELEASE_TABLET | Freq: Three times a day (TID) | ORAL | 0 refills | Status: DC

## 2016-06-04 MED ORDER — NIVOLUMAB CHEMO INJECTION 100 MG/10ML
240.0000 mg | Freq: Once | INTRAVENOUS | Status: AC
  Administered 2016-06-04: 240 mg via INTRAVENOUS
  Filled 2016-06-04: qty 24

## 2016-06-04 MED FILL — oxyCODONE HCL 30 MG TABS: 30 | 20 days supply | Qty: 120 | Fill #0

## 2016-06-04 NOTE — Patient Instructions (Signed)
[facility] Discharge Instructions for Patients Receiving Chemotherapy  Today you received the following chemotherapy agents:  Nivolumab.  To help prevent nausea and vomiting after your treatment, we encourage you to take your nausea medication as directed.   If you develop nausea and vomiting that is not controlled by your nausea medication, call the clinic.   BELOW ARE SYMPTOMS THAT SHOULD BE REPORTED IMMEDIATELY:  *FEVER GREATER THAN 100.5 F  *CHILLS WITH OR WITHOUT FEVER  NAUSEA AND VOMITING THAT IS NOT CONTROLLED WITH YOUR NAUSEA MEDICATION  *UNUSUAL SHORTNESS OF BREATH  *UNUSUAL BRUISING OR BLEEDING  TENDERNESS IN MOUTH AND THROAT WITH OR WITHOUT PRESENCE OF ULCERS  *URINARY PROBLEMS  *BOWEL PROBLEMS  UNUSUAL RASH Items with * indicate a potential emergency and should be followed up as soon as possible.  Feel free to call the clinic you have any questions or concerns. The clinic phone number is [phone].  Please show the CHEMO ALERT CARD at check-in to the Emergency Department and triage nurse.   

## 2016-06-05 LAB — AFP TUMOR MARKER: AFP, SERUM, TUMOR MARKER: 3.5 ng/mL (ref 0.0–8.3)

## 2016-06-10 ENCOUNTER — Telehealth: Payer: Self-pay | Admitting: Hematology

## 2016-06-10 NOTE — Telephone Encounter (Signed)
Patient bypassed scheduling on 06/04/16. Lab and Chemo was scheduled in 3 and 6 weeks, per 06/04/16 los.  (3 weeks with f/u) Appointments confirmed with patient, per 06/04/16 los.

## 2016-06-16 ENCOUNTER — Telehealth: Payer: Self-pay

## 2016-06-16 ENCOUNTER — Other Ambulatory Visit: Payer: Self-pay | Admitting: Hematology

## 2016-06-16 DIAGNOSIS — C220 Liver cell carcinoma: Secondary | ICD-10-CM

## 2016-06-16 MED ORDER — RIVAROXABAN 20 MG PO TABS: 20.0000 mg | ORAL_TABLET | Freq: Every day | ORAL | 2 refills | Status: DC

## 2016-06-16 MED ORDER — ALPRAZOLAM 1 MG PO TABS: 1.0000 mg | ORAL_TABLET | Freq: Three times a day (TID) | ORAL | 0 refills | Status: DC | PRN

## 2016-06-16 MED FILL — ALPRAZolam 1 MG TABS: 1 | 30 days supply | Qty: 90 | Fill #0

## 2016-06-16 MED FILL — XARELTO 20 MG TABLET: 20 | 30 days supply | Qty: 30 | Fill #0

## 2016-06-16 MED FILL — OxyCONTIN 80 MG T12A: 80 | 30 days supply | Qty: 90 | Fill #0

## 2016-06-16 NOTE — Telephone Encounter (Signed)
Pt walked in for xanax script. Brought in [place] bottle for refill. Both called into [facility] pharm per protocol.

## 2016-06-24 NOTE — Progress Notes (Signed)
[place]  [phone]) [phone] Fax:(336) [phone]  Clinic Follow up Note   Patient Care Team: Antonietta Jewel, MD as PCP - General (Internal Medicine) [name], Cristopher Estimable, MD as Attending Physician (Gastroenterology) Antonietta Jewel, MD as Referring Physician (Internal Medicine) Truitt Merle, MD as Consulting Physician (Hematology) [name], CRNP as Nurse Practitioner (Nurse Practitioner) 06/25/2016   CHIEF COMPLAINTS:  Follow up recurrent [place]   Oncology History   Hepatocellular carcinoma [facility])   Staging form: Liver (Excluding Intrahepatic Bile Ducts), AJCC 7th Edition   - Clinical stage from 04/16/2012: Stage II (T2(m), N0, M0) - Signed by Truitt Merle, MD on 10/12/2015   - Pathologic stage from 04/16/2013: Stage II (T2, N0, cM0) - Signed by Truitt Merle, MD on 10/12/2015      Hepatocellular carcinoma ([place])   05/1927 Imaging         09/14/2012 Tumor Marker    AFP 28.9      04/16/2013 Imaging    Abdominal MRI with and without contrast reviewed multifocal (4) liver lesions in both left and right lobes, most consistent with HCC, largest measuring 2.7 cm      04/16/2013 Initial Diagnosis    Hepatocellular carcinoma ([place])      04/28/2013 Procedure    Right TACE with lipiodol       06/15/2013 Procedure    Left TACE with [facility]       07/14/2013 Procedure    Right TACE with [facility]      09/05/2015 Imaging    CT abdomen with and without contrast showed a new 5.0 x 2.3 cm mass in the hepatic segment 8, invading and occluding the right anterior portal vein, most compatible with [place]. A portacaval node measuring 3.0 x 1.4 cm, previously 2.9 x 1.1 cm.      10/11/2015 Tumor Marker    AFP 5.3      11/11/2015 - 02/03/2016 Chemotherapy    sorafenib 200mg  bid started on 11/11/2015, increased to 400mg  bid in 2 weeks, stopped due to poor tolerance and probable disease progression       11/14/2015 Imaging    CT CAP 11/14/2015 IMPRESSION: Chest Impression: 1. RIGHT upper lobe pulmonary  nodule is indeterminate. No comparison available. 2. Ground-glass opacity and peripheral nodules in the RIGHT middle lobe appear post infectious or inflammatory.  Abdomen / Pelvis Impression: 1. 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 Thrombus also likely within the LEFT and RIGHT portal vein. Difficult to distinguish tumor thrombus versus bland thrombus. 2. Individual lesions are difficult to define in the RIGHT hepatic lobe. There is overall impression of progression of disease in the RIGHT hepatic lobe with multiple ill-defined enhancing lesions. 3. Periportal and shotty retroperitoneal adenopathy similar to prior.      01/25/2016 Imaging    CT Abdomen Pelvis w/ Contrast IMPRESSION: 1. Findings consistent with multifocal liver carcinoma with extensive portal vein thrombosis, and subsequent development of porta hepatis venous collaterals. Thrombus in the central portal vein is no longer expansile, but still expands the more peripheral portal vein and the right and left portal veins. There are few right liver bile ducts there are now dilated, which suggests mild progression of liver disease. 2. There is mild splenomegaly consistent or venous hypertension, which has mildly increased from the prior CT. 3. There are new lung abnormalities with interstitial thickening and ill-defined peribronchovascular nodular opacities mostly at the right lung base with a  smaller area noted along the medial left lower lobe. The etiology may be infectious. It may be inflammatory, possibly related to chemotherapy. Neoplastic disease is also possible. 4. There is no other evidence of metastatic disease within the abdomen or pelvis. No acute findings within the abdomen or pelvis      01/25/2016 - 01/25/2016 Hospital Admission    Pt was seen in ED for worsening abdominal pain, treated with pain meds        02/04/2016 -  Chemotherapy    The patient started on immunotherapy treatment nivolumab every 2 weeks; on 04/30/16 dose was reduced to 420mg  every 3 weeks       02/14/2016 Imaging    CT CAP w/ contrast 1. Slight interval increase in size of the mediastinal lymph nodes. 2. Persistent extensive lower lobe peribronchial thickening and patchy infiltrates. Possible aspiration pneumonia. 3. Persistent tree-in-bud appearance in the right middle lobe, likely chronic inflammation or atypical infection. 4. Improved CT appearance of the liver. Two small residual arterial phase enhancing lesions are identified. 5. Chronic portal vein thrombosis with cavernous transformation of portal venous collaterals. Esophageal varices are again noted. 6. Stable upper abdominal lymph nodes likely related to cirrhosis.       04/16/2016 Tumor Marker    AFP 4.1       04/17/2016 Imaging     CT CAP W PELVIS IMPRESSION: 1. Stable mild mediastinal lymphadenopathy. 2. Interval improvement in the bibasilar peribronchial thickening, airway impaction, and peribronchovascular nodularity. Imaging features suggest marked improvement in bilateral low the atypical infection. Changes in the right middle lobe with more stable and may represent scarring from previous infectious/inflammatory etiology. 3. The small hypervascular lesions seen in the anterior left liver on the previous study are not evident today. There is some subtle, ill-defined hyperenhancement in the central left liver which is nonspecific. Attention to this area on followup imaging recommended. 4. Chronic portal vein inclusion with cavernous transformation in the porta hepatis and paraesophageal varices. 5. Stable appearance hepatoduodenal and retroperitoneal lymphadenopathy, likely chronic and related to liver disease.      04/17/2016 Imaging    CT CAP CONTRAST IMPRESSION: 1. Stable mild mediastinal lymphadenopathy. 2. Interval improvement in the bibasilar  peribronchial thickening, airway impaction, and peribronchovascular nodularity. Imaging features suggest marked improvement in bilateral low the atypical infection. Changes in the right middle lobe with more stable and may represent scarring from previous infectious/inflammatory etiology. 3. The small hypervascular lesions seen in the anterior left liver on the previous study are not evident today. There is some subtle, ill-defined hyperenhancement in the central left liver which is nonspecific. Attention to this area on followup imaging recommended. 4. Chronic portal vein inclusion with cavernous transformation in the porta hepatis and paraesophageal varices. 5. Stable appearance hepatoduodenal and retroperitoneal lymphadenopathy, likely chronic and related to liver disease.      05/21/2016 Imaging    MR Abdomen W WO Contrast IMPRESSION: 1. Limited motion degraded scan. Cirrhosis. No evidence of a liver mass within these limitations. 2. Mild splenomegaly. No ascites. Stable mild paraumbilical and gastroesophageal varices. 3. Stable nonspecific mild retroperitoneal lymphadenopathy. 4. 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      06/04/2016 Tumor Marker    AFP 3.5        HISTORY OF PRESENTING ILLNESS (10/11/2015):  Kevin Dillon 66 y.o. male with past medical history of treated hepatitis C, liver cirrhosis, and hepatocellular carcinoma is here because of recurrent hepatocellular carcinoma. He is  accompanied by his significant other [name] and her sister.  He was diagnosed with hepatocellular carcinoma in 2015, his initial image result is not available and staging is unknown. He was seen by interventional radiologist Dr. Delice Lesch at [facility] in [place] and underwent TACE procedure 3 times in 2015. He was subsequently followed, and recently repeated CT scan showed a new 5.0 cm mass in segment 8, with portal vein invasion. He was  felt not to be a candidate for further liver targeted therapy, and was referred to [place] for further systemic therapy.  He complains about mid epigastric pain for the past 2 years, which significantly improved after his TACE procedure in 2015. It has been getting worse lately in the past 6 months. He states he gets about 7-8 out of 10, persistent, he has been taking oxycodone 20 mg every 6 hours, but his pain is not well controlled. He is quite fatigued, is low appetite. He last 40 lbs in the pat 4 months. He is able to function at home, in trying to remain to be physically active.  His hepatitis C was successfully treated. He has history of hepatitis C, complicated with ascites and encephalopathy, but it has been well controlled with medical management.   CURRENT THERAPY: Nivolumab 240mg  every 2 weeks, started on 02/04/2015; on 04/30/16 switched to every 3 weeks due to fatigue  INTERIM HISTORY: Jemuel returns for follow up  He has been doing well. He has gained over 10 pounds lately. He had ankle swelling last week. He has back pain which causes him to stop what he's doing.  MEDICAL HISTORY:  Past Medical History:  Diagnosis Date  . Cancer ([place])    liver  . Cholelithiasis   . Chronic hepatitis C ([place])   . Chronic knee pain   . Chronic left shoulder pain   . CVA (cerebral infarction)   . Gout   . Hepatitis C    genotype 1b.  pt has been vaccinated against hep A and B.  . Hypertension   . Osteoarthritis   . Schatzki's ring   . Tubular adenoma of colon 12/2011    SURGICAL HISTORY: Past Surgical History:  Procedure Laterality Date  . AMPUTATION Left 09/17/2012   Procedure: SMALL FINGER EXTENSOR TENDON REPAIR; METACARPAL LEVEL AMPUTATION RING FINGER; PROXIMAL PHALANX LEVEL AMPUTATION LONG FINGER;  Surgeon: Schuyler Amor, MD;  Location: [place];  Service: Orthopedics;  Laterality: Left;  . Arm surgery     right/plate in arm  . COLONOSCOPY WITH ESOPHAGOGASTRODUODENOSCOPY (EGD)  12/30/2011     RMR: Noncritical Schatzki's ring;  Hiatal hernia, Tubular ADENOMA removed from splenic flexure, otherwise normal colonoscopy  . LEG SURGERY     left  . WOUND EXPLORATION Left 09/17/2012   Procedure: WOUND EXPLORATION;  Surgeon: Schuyler Amor, MD;  Location: [place];  Service: Orthopedics;  Laterality: Left;    SOCIAL HISTORY: Social History   Social History  . Marital status: Single    Spouse name: N/A  . Number of children: 0  . Years of education: N/A   Occupational History  . disabled    Social History Main Topics  . Smoking status: Former Smoker    Packs/day: 0.50    Years: 40.00    Types: Cigarettes    Quit date: 01/19/2012  . Smokeless tobacco: Never Used     Comment: Smokes 1/2 pack of cigarettes daily  . Alcohol use No     Comment: HX 2 beers 3-4 days per  week; QUIT DEC 2013  . Drug use: No     Comment: Hx cocaine yrs ago, Last marijuana OCT 2013  . Sexual activity: Not Currently    Partners: Female   Other Topics Concern  . Not on file   Social History Narrative   Lives w/ significant other, [name]    FAMILY HISTORY: Family History  Problem Relation Age of Onset  . Cirrhosis Father 42  . Cancer Father        liver cancer   . Lung cancer Mother 18  . Colon cancer Neg Hx     ALLERGIES:  has No Known Allergies.  MEDICATIONS:  Current Outpatient Prescriptions  Medication Sig Dispense Refill  . ALPRAZolam (XANAX) 1 MG tablet Take 1 tablet (1 mg total) by mouth 3 (three) times daily as needed for anxiety. 90 tablet 0  . hydrALAZINE (APRESOLINE) 25 MG tablet Take 50 mg by mouth daily.     Marland Kitchen lactulose (CHRONULAC) 10 GM/15ML solution 30 g daily as needed for moderate constipation.     . megestrol (MEGACE) 40 MG/ML suspension Take 400 mg by mouth daily.     . Multiple Vitamins-Minerals (CENTRUM SILVER PO) Take 1 tablet by mouth daily.     Marland Kitchen oxyCODONE (OXYCONTIN) 80 mg 12 hr tablet Take 1 tablet (80 mg total) by mouth every 8 (eight) hours. 90  tablet 0  . oxycodone (ROXICODONE) 30 MG immediate release tablet Take 1 tablet (30 mg total) by mouth every 4 (four) hours as needed for pain. 120 tablet 0  . PARoxetine (PAXIL) 20 MG tablet Take 20 mg by mouth every morning.     . prochlorperazine (COMPAZINE) 10 MG tablet Take 1 tablet (10 mg total) by mouth every 8 (eight) hours as needed for nausea or vomiting. 30 tablet 1  . rivaroxaban (XARELTO) 20 MG TABS tablet Take 1 tablet (20 mg total) by mouth daily with supper. 30 tablet 2   No current facility-administered medications for this visit.     REVIEW OF SYSTEMS:  Constitutional: Denies fevers, chills or abnormal night sweats  (+)gained 10 pounds Eyes: Denies blurriness of vision, double vision or watery eyes Ears, nose, mouth, throat, and face: Denies mucositis or sore throat Respiratory: Denies cough, dyspnea or wheezes Cardiovascular: Denies palpitation, chest discomfort or lower extremity swelling Gastrointestinal:  Denies nausea, heartburn or change in bowel habits,  Skin: Denies abnormal skin rashes Lymphatics: Denies new lymphadenopathy or easy bruising Neurological:Denies numbness, tingling or new weaknesses Behavioral/Psych: normal Muscuoskeletal: (+) back pain  All other systems were reviewed with the patient and are negative.  PHYSICAL EXAMINATION  ECOG PERFORMANCE STATUS: 2  Vitals:   06/25/16 1434  BP: (!) 177/74  Pulse: (!) 102  Resp: 18  Temp: 98.2 F (36.8 C)   Filed Weights   06/25/16 1434  Weight: 188 lb 14.4 oz (85.7 kg)    GENERAL:alert,  SKIN: skin color, texture, turgor are normal, no rashes or significant lesions EYES: normal, conjunctiva are pink and non-injected, sclera clear OROPHARYNX:no exudate, no erythema and lips, buccal mucosa, and tongue normal  NECK: supple, thyroid normal size, non-tender, without nodularity LYMPH:  no palpable lymphadenopathy in the cervical, axillary or inguinal LUNGS: clear to auscultation and percussion with  normal breathing effort HEART: regular rate & rhythm and no murmurs and no lower extremity edema ABDOMEN:abdomen soft, non-tender and normal bowel sounds,  (+) abdominal bloating  Musculoskeletal:no cyanosis of digits and no clubbing  PSYCH: alert & oriented x 3 with  fluent speech NEURO: no focal motor/sensory deficits  LABORATORY DATA:  I have reviewed the data as listed CBC Latest Ref Rng & Units 06/25/2016 06/04/2016 04/30/2016  WBC 4.0 - 10.3 10e3/uL 9.1 6.4 6.8  Hemoglobin 13.0 - 17.1 g/dL 12.5(L) 11.4(L) 10.4(L)  Hematocrit 38.4 - 49.9 % 36.2(L) 33.2(L) 30.3(L)  Platelets 140 - 400 10e3/uL 152 156 181   CMP Latest Ref Rng & Units 06/25/2016 06/04/2016 04/30/2016  Glucose 70 - 140 mg/dl 125 103 141(H)  BUN 7.0 - 26.0 mg/dL 15.9 14.3 14.3  Creatinine 0.7 - 1.3 mg/dL 1.5(H) 1.4(H) 1.2  Sodium 136 - 145 mEq/L 139 139 139  Potassium 3.5 - 5.1 mEq/L 4.2 3.8 4.1  Chloride 101 - 111 mmol/L - - -  CO2 22 - 29 mEq/L 25 23 23   Calcium 8.4 - 10.4 mg/dL 9.6 9.6 9.3  Total Protein 6.4 - 8.3 g/dL 8.0 7.8 7.3  Total Bilirubin 0.20 - 1.20 mg/dL 0.62 0.44 0.40  Alkaline Phos 40 - 150 U/L 101 103 119  AST 5 - 34 U/L 30 24 26   ALT 0 - 55 U/L 19 13 12    AFP: 09/14/2012: 28.9 10/11/2015: 5.3 11/01/2015: 3.7 12/02/2015: 3.7 01/22/2016: 3.4 02/18/2016: 7.1 03/17/16: 5.8 04/16/16: 4.1 06/04/2016: 3.5  RADIOGRAPHIC STUDIES: I have personally reviewed the radiological images as listed and agreed with the findings in the report.  04/17/16 CT CAP W PELVIS IMPRESSION: 1. Stable mild mediastinal lymphadenopathy. 2. Interval improvement in the bibasilar peribronchial thickening, airway impaction, and peribronchovascular nodularity. Imaging features suggest marked improvement in bilateral low the atypical infection. Changes in the right middle lobe with more stable and may represent scarring from previous infectious/inflammatory etiology. 3. The small hypervascular lesions seen in the anterior left liver on  the previous study are not evident today. There is some subtle, ill-defined hyperenhancement in the central left liver which is nonspecific. Attention to this area on followup imaging recommended. 4. Chronic portal vein inclusion with cavernous transformation in the porta hepatis and paraesophageal varices. 5. Stable appearance hepatoduodenal and retroperitoneal lymphadenopathy, likely chronic and related to liver disease.  CT CAP w/ contrast 02/14/2016 IMPRESSION: 1. Slight interval increase in size of the mediastinal lymph nodes. 2. Persistent extensive lower lobe peribronchial thickening and patchy infiltrates. Possible aspiration pneumonia. 3. Persistent tree-in-bud appearance in the right middle lobe, likely chronic inflammation or atypical infection. 4. Improved CT appearance of the liver. Two small residual arterial phase enhancing lesions are identified. 5. Chronic portal vein thrombosis with cavernous transformation of portal venous collaterals. Esophageal varices are again noted. 6. Stable upper abdominal lymph nodes likely related to cirrhosis.  CT abdomen pelvis w/ contrast 01/25/2016 IMPRESSION: 1. Findings consistent with multifocal liver carcinoma with extensive portal vein thrombosis, and subsequent development of porta hepatis venous collaterals. Thrombus in the central portal vein is no longer expansile, but still expands the more peripheral portal vein and the right and left portal veins. There are few right liver bile ducts there are now dilated, which suggests mild progression of liver disease. 2. There is mild splenomegaly consistent or venous hypertension, which has mildly increased from the prior CT. 3. There are new lung abnormalities with interstitial thickening and ill-defined peribronchovascular nodular opacities mostly at the right lung base with a smaller area noted along the medial left lower lobe. The etiology may be infectious. It may be  inflammatory, possibly related to chemotherapy. Neoplastic disease is also possible. 4. There is no other evidence of metastatic disease within the abdomen or pelvis.  No acute findings within the abdomen or pelvis.  MR Abdomen W WO Contrast 05/21/16 IMPRESSION: 1. Limited motion degraded scan. Cirrhosis. No evidence of a liver mass within these limitations. 2. Mild splenomegaly. No ascites. Stable mild paraumbilical and gastroesophageal varices. 3. Stable nonspecific mild retroperitoneal lymphadenopathy. 4. 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  ASSESSMENT & PLAN: 66 y.o. Caucasian male with past medical history of successfully treated hepatitis C, liver cirrhosis, history of ascites and encephalopathy, recurrent HCC  1. Recurrent HCC, initially stage II -I previously reviewed his previous image and outside medical records extensively, confirmed key findings with patient and his family members -He initially had a multifocal disease, status post TACE 3 times in 2015 -he now has developed symptomatic local recurrence, with a 5cm new lesion in segment 8 with direct invasion into portal vein, and a portocaval lymph node. He is not a candidate for liver targeted therapy per his IR Dr. Delice Lesch -His previous CT abdomen and pelvis on 01/25/2016 revealed extensive portal hypertension, and a probable cancer progression in the liver. -His treatment has changed to immunotherapy Nivolumab, he tolerated very well, and clinically he is doing much better, with less pain and better energy. Continue.  -I previously discussed his restaging CT scan which was done on 02/14/2016, it actually showed decreased liver cancer size compared to the scan a month ago. No other new metastasis. -restaging CT from 04/17/2016 showed no visible liver mass, but the imaging was very limited due to his liver cirrhosis  -I previously reviewed her recent abdominal MRI from 05/21/2016,  which showed no visible mass in the liver. He has had complete radiographic response. -Nivo has been changed to every 3 weeks, per pt's request due to fatigue and other side effects -his overall clinically doing well, we'll continue treatment. - Labs reviewed, He is adequate for treatment today and will continue every 3 weeks  - f/u in 6 weeks  -plan to repeat abd MRI in 08/2016  2. Abdominal pain/ back pain -Secondary to [facility] and portal vein thrombosis -overall better now with oxycodone and OxyContin, he is on very high dose, overall pain is controlled  -I previously recommended him to see pain management clinic, he declined. -he is taking oxycodone 6-7 pain pills a day. I previously encouraged him to continue the OxyContin, but to only take the oxycodone PRN.  - Patient currently takes OxyContin 3 times daily, and reports it has helped him to most. - I refilled refill  OxyContin.   3. Portal vein thrombosis -continue xarelto  -We previously discussed the moderate to high risk of bleeding with Xarelto due to his liver cirrhosis and he knows to avoid injury and fall   4 Liver cirrhosis secondary to hepatitis C and alcohol, history of encephalopathy and ascites -He will continue follow-up with [facility], his ascites has been well controlled with diuretics, but seems getting worse lately, I previously encourage him to follow up with [facility]  -He knows to use laxative, especially lactulose for his constipation  5. Hep C, successfully treated -Continue follow-up with liver clinic NP [facility]  6. Anxiety and insomnia  -He has Xanax for anxiety. I previously refilled Xanax 1 mg. -continue Ativan 1 mg at bedtime as needed for insomnia, he knows not to take with Xanax together, he knows not to take during the day. He does not feel Ativan is working well for sleep, he may take xanax for insomnia   7. Goal of care  discussion, DNR/DNI  -We previously discussed the incurable nature of his cancer, and the  overall poor prognosis, especially if he does not have good response to treatment -The patient understands the goal of care is palliative. -I previously recommend DNR/DNI, he agreed  8. HTN -He has stopped taking his BP medication. His BP today is elevated  -He will monitor his BP at home. -I previously encourage him to follow up with PCP, and restart his BP meds   9. Smoking - Patient has started back smoking due to the fear of not getting better - I previously strongly recommended a smoking cessation program. He refused.  10. Arthralgia -Patient has developed significant arthralgia, possible related to Nivolumab. It has much improved this week. Exam was unremarkable. - Patient takes OxyContin to help and oxycodone prn - I advised he can take up to ibuprofen 3 a day - If pain worsens, I may try a short course of steroids  11. Depression - The patient reports feelings of depression related to his diagnosis. - He reports fears he is going to die soon. - I previously encouraged the patient that his recent scan shows an excellent response to treatment.  Plan  - Proceed with Nivolumab infusion today, and every 3 weeks. -Refill for OxyContin today - Follow up in 6 weeks   All questions were answered. The patient knows to call the clinic with any problems, questions or concerns.  I spent 20 minutes counseling the patient face to face. The total time spent in the appointment was 30 minutes and more than 50% was on counseling.  This document serves as a record of services personally performed by Truitt Merle, MD. It was created on her behalf by Brandt Loosen, a trained medical scribe. The creation of this record is based on the scribe's personal observations and the provider's statements to them. This document has been checked and approved by the attending provider.   I have reviewed the above documentation for accuracy and completeness and I agree with the above.   Truitt Merle, MD 06/25/2016

## 2016-06-25 ENCOUNTER — Encounter: Payer: Self-pay | Admitting: Hematology

## 2016-06-25 ENCOUNTER — Telehealth: Payer: Self-pay | Admitting: Hematology

## 2016-06-25 ENCOUNTER — Other Ambulatory Visit (HOSPITAL_BASED_OUTPATIENT_CLINIC_OR_DEPARTMENT_OTHER): Payer: Medicare Other

## 2016-06-25 ENCOUNTER — Ambulatory Visit (HOSPITAL_BASED_OUTPATIENT_CLINIC_OR_DEPARTMENT_OTHER): Payer: Medicare Other | Admitting: Hematology

## 2016-06-25 ENCOUNTER — Ambulatory Visit (HOSPITAL_BASED_OUTPATIENT_CLINIC_OR_DEPARTMENT_OTHER): Payer: Medicare Other

## 2016-06-25 VITALS — HR 98

## 2016-06-25 VITALS — BP 177/74 | HR 102 | Temp 98.2°F | Resp 18 | Ht 73.0 in | Wt 188.9 lb

## 2016-06-25 DIAGNOSIS — C220 Liver cell carcinoma: Secondary | ICD-10-CM

## 2016-06-25 DIAGNOSIS — K7469 Other cirrhosis of liver: Secondary | ICD-10-CM | POA: Diagnosis not present

## 2016-06-25 DIAGNOSIS — Z8619 Personal history of other infectious and parasitic diseases: Secondary | ICD-10-CM

## 2016-06-25 DIAGNOSIS — F329 Major depressive disorder, single episode, unspecified: Secondary | ICD-10-CM | POA: Diagnosis not present

## 2016-06-25 DIAGNOSIS — G4700 Insomnia, unspecified: Secondary | ICD-10-CM

## 2016-06-25 DIAGNOSIS — I81 Portal vein thrombosis: Secondary | ICD-10-CM | POA: Diagnosis not present

## 2016-06-25 DIAGNOSIS — F419 Anxiety disorder, unspecified: Secondary | ICD-10-CM | POA: Diagnosis not present

## 2016-06-25 DIAGNOSIS — Z720 Tobacco use: Secondary | ICD-10-CM

## 2016-06-25 DIAGNOSIS — Z5112 Encounter for antineoplastic immunotherapy: Secondary | ICD-10-CM | POA: Diagnosis present

## 2016-06-25 DIAGNOSIS — Z7901 Long term (current) use of anticoagulants: Secondary | ICD-10-CM | POA: Diagnosis not present

## 2016-06-25 DIAGNOSIS — G893 Neoplasm related pain (acute) (chronic): Secondary | ICD-10-CM

## 2016-06-25 LAB — CBC WITH DIFFERENTIAL/PLATELET
BASO%: 0.5 % (ref 0.0–2.0)
BASOS ABS: 0 10*3/uL (ref 0.0–0.1)
EOS ABS: 0.2 10*3/uL (ref 0.0–0.5)
EOS%: 2.4 % (ref 0.0–7.0)
HCT: 36.2 % — ABNORMAL LOW (ref 38.4–49.9)
HGB: 12.5 g/dL — ABNORMAL LOW (ref 13.0–17.1)
LYMPH%: 27.8 % (ref 14.0–49.0)
MCH: 30.2 pg (ref 27.2–33.4)
MCHC: 34.4 g/dL (ref 32.0–36.0)
MCV: 87.5 fL (ref 79.3–98.0)
MONO#: 0.8 10*3/uL (ref 0.1–0.9)
MONO%: 9.1 % (ref 0.0–14.0)
NEUT#: 5.5 10*3/uL (ref 1.5–6.5)
NEUT%: 60.2 % (ref 39.0–75.0)
Platelets: 152 10*3/uL (ref 140–400)
RBC: 4.14 10*6/uL — AB (ref 4.20–5.82)
RDW: 15.5 % — ABNORMAL HIGH (ref 11.0–14.6)
WBC: 9.1 10*3/uL (ref 4.0–10.3)
lymph#: 2.5 10*3/uL (ref 0.9–3.3)

## 2016-06-25 LAB — COMPREHENSIVE METABOLIC PANEL
ALBUMIN: 3.9 g/dL (ref 3.5–5.0)
ALK PHOS: 101 U/L (ref 40–150)
ALT: 19 U/L (ref 0–55)
AST: 30 U/L (ref 5–34)
Anion Gap: 9 mEq/L (ref 3–11)
BUN: 15.9 mg/dL (ref 7.0–26.0)
CO2: 25 mEq/L (ref 22–29)
Calcium: 9.6 mg/dL (ref 8.4–10.4)
Chloride: 105 mEq/L (ref 98–109)
Creatinine: 1.5 mg/dL — ABNORMAL HIGH (ref 0.7–1.3)
EGFR: 50 mL/min/{1.73_m2} — AB (ref >90)
GLUCOSE: 125 mg/dL (ref 70–140)
POTASSIUM: 4.2 meq/L (ref 3.5–5.1)
SODIUM: 139 meq/L (ref 136–145)
Total Bilirubin: 0.62 mg/dL (ref 0.20–1.20)
Total Protein: 8 g/dL (ref 6.4–8.3)

## 2016-06-25 MED ORDER — SODIUM CHLORIDE 0.9 % IV SOLN
240.0000 mg | Freq: Once | INTRAVENOUS | Status: AC
  Administered 2016-06-25: 240 mg via INTRAVENOUS
  Filled 2016-06-25: qty 24

## 2016-06-25 MED ORDER — OXYCODONE HCL 30 MG PO TABS: 30.0000 mg | ORAL_TABLET | ORAL | 0 refills | Status: DC | PRN

## 2016-06-25 MED ORDER — SODIUM CHLORIDE 0.9 % IV SOLN
Freq: Once | INTRAVENOUS | Status: AC
  Administered 2016-06-25: 16:00:00 via INTRAVENOUS

## 2016-06-25 MED FILL — oxyCODONE HCL 30 MG TABS: 30 | 20 days supply | Qty: 120 | Fill #0

## 2016-06-25 NOTE — Patient Instructions (Signed)
[facility] Discharge Instructions for Patients Receiving Chemotherapy  Today you received the following chemotherapy agents Opdivo.  To help prevent nausea and vomiting after your treatment, we encourage you to take your nausea medication as directed.   If you develop nausea and vomiting that is not controlled by your nausea medication, call the clinic.   BELOW ARE SYMPTOMS THAT SHOULD BE REPORTED IMMEDIATELY:  *FEVER GREATER THAN 100.5 F  *CHILLS WITH OR WITHOUT FEVER  NAUSEA AND VOMITING THAT IS NOT CONTROLLED WITH YOUR NAUSEA MEDICATION  *UNUSUAL SHORTNESS OF BREATH  *UNUSUAL BRUISING OR BLEEDING  TENDERNESS IN MOUTH AND THROAT WITH OR WITHOUT PRESENCE OF ULCERS  *URINARY PROBLEMS  *BOWEL PROBLEMS  UNUSUAL RASH Items with * indicate a potential emergency and should be followed up as soon as possible.  Feel free to call the clinic you have any questions or concerns. The clinic phone number is [phone].  Please show the CHEMO ALERT CARD at check-in to the Emergency Department and triage nurse.    

## 2016-06-25 NOTE — Telephone Encounter (Signed)
Scheduled appt per 6/7 los - Gave patient AVS and calender per 6/7

## 2016-06-28 ENCOUNTER — Encounter: Payer: Self-pay | Admitting: Hematology

## 2016-07-16 ENCOUNTER — Other Ambulatory Visit: Payer: Self-pay | Admitting: *Deleted

## 2016-07-16 ENCOUNTER — Ambulatory Visit (HOSPITAL_BASED_OUTPATIENT_CLINIC_OR_DEPARTMENT_OTHER): Payer: Medicare Other

## 2016-07-16 ENCOUNTER — Other Ambulatory Visit (HOSPITAL_BASED_OUTPATIENT_CLINIC_OR_DEPARTMENT_OTHER): Payer: Medicare Other

## 2016-07-16 VITALS — BP 146/77 | HR 94 | Temp 98.1°F | Resp 18

## 2016-07-16 DIAGNOSIS — C220 Liver cell carcinoma: Secondary | ICD-10-CM

## 2016-07-16 DIAGNOSIS — Z5112 Encounter for antineoplastic immunotherapy: Secondary | ICD-10-CM

## 2016-07-16 DIAGNOSIS — Z79899 Other long term (current) drug therapy: Secondary | ICD-10-CM

## 2016-07-16 LAB — COMPREHENSIVE METABOLIC PANEL
ALBUMIN: 3.8 g/dL (ref 3.5–5.0)
ALT: 14 U/L (ref 0–55)
AST: 25 U/L (ref 5–34)
Alkaline Phosphatase: 103 U/L (ref 40–150)
Anion Gap: 9 mEq/L (ref 3–11)
BILIRUBIN TOTAL: 0.39 mg/dL (ref 0.20–1.20)
BUN: 15.7 mg/dL (ref 7.0–26.0)
CHLORIDE: 108 meq/L (ref 98–109)
CO2: 21 meq/L — AB (ref 22–29)
CREATININE: 1.5 mg/dL — AB (ref 0.7–1.3)
Calcium: 9.2 mg/dL (ref 8.4–10.4)
EGFR: 48 mL/min/{1.73_m2} — ABNORMAL LOW (ref >90)
GLUCOSE: 137 mg/dL (ref 70–140)
Potassium: 3.8 mEq/L (ref 3.5–5.1)
Sodium: 138 mEq/L (ref 136–145)
Total Protein: 7.6 g/dL (ref 6.4–8.3)

## 2016-07-16 LAB — CBC WITH DIFFERENTIAL/PLATELET
BASO%: 0.4 % (ref 0.0–2.0)
BASOS ABS: 0 10*3/uL (ref 0.0–0.1)
EOS ABS: 0.1 10*3/uL (ref 0.0–0.5)
EOS%: 1.4 % (ref 0.0–7.0)
HEMATOCRIT: 33.8 % — AB (ref 38.4–49.9)
HEMOGLOBIN: 11.7 g/dL — AB (ref 13.0–17.1)
LYMPH#: 2.6 10*3/uL (ref 0.9–3.3)
LYMPH%: 28.9 % (ref 14.0–49.0)
MCH: 30.5 pg (ref 27.2–33.4)
MCHC: 34.8 g/dL (ref 32.0–36.0)
MCV: 87.6 fL (ref 79.3–98.0)
MONO#: 0.8 10*3/uL (ref 0.1–0.9)
MONO%: 8.9 % (ref 0.0–14.0)
NEUT#: 5.3 10*3/uL (ref 1.5–6.5)
NEUT%: 60.4 % (ref 39.0–75.0)
Platelets: 204 10*3/uL (ref 140–400)
RBC: 3.86 10*6/uL — ABNORMAL LOW (ref 4.20–5.82)
RDW: 15.2 % — AB (ref 11.0–14.6)
WBC: 8.8 10*3/uL (ref 4.0–10.3)

## 2016-07-16 LAB — TSH: TSH: 5.34 m(IU)/L — ABNORMAL HIGH (ref 0.320–4.118)

## 2016-07-16 MED ORDER — OXYCODONE HCL ER 80 MG PO T12A
80.0000 mg | EXTENDED_RELEASE_TABLET | Freq: Three times a day (TID) | ORAL | 0 refills | Status: DC
Start: 2016-07-16 — End: 2016-08-06

## 2016-07-16 MED ORDER — ALPRAZOLAM 1 MG PO TABS: 1.0000 mg | ORAL_TABLET | Freq: Three times a day (TID) | ORAL | 0 refills | Status: DC | PRN

## 2016-07-16 MED ORDER — SODIUM CHLORIDE 0.9 % IV SOLN
240.0000 mg | Freq: Once | INTRAVENOUS | Status: AC
  Administered 2016-07-16: 240 mg via INTRAVENOUS
  Filled 2016-07-16: qty 24

## 2016-07-16 MED ORDER — SODIUM CHLORIDE 0.9 % IV SOLN
Freq: Once | INTRAVENOUS | Status: AC
  Administered 2016-07-16: 1 mL via INTRAVENOUS

## 2016-07-16 MED ORDER — OXYCODONE HCL 30 MG PO TABS: 30.0000 mg | ORAL_TABLET | ORAL | 0 refills | Status: DC | PRN

## 2016-07-16 MED FILL — OxyCONTIN 80 MG T12A: 80 | 30 days supply | Qty: 90 | Fill #0

## 2016-07-16 MED FILL — ALPRAZolam 1 MG TABS: 1 | 30 days supply | Qty: 90 | Fill #0

## 2016-07-16 MED FILL — XARELTO 20 MG TABLET: 20 | 30 days supply | Qty: 30 | Fill #1

## 2016-07-16 MED FILL — oxyCODONE HCL 30 MG TABS: 30 | 20 days supply | Qty: 120 | Fill #0

## 2016-07-16 NOTE — Patient Instructions (Signed)
[facility] Discharge Instructions for Patients Receiving Chemotherapy  Today you received the following chemotherapy agents Opdivo.  To help prevent nausea and vomiting after your treatment, we encourage you to take your nausea medication as directed.   If you develop nausea and vomiting that is not controlled by your nausea medication, call the clinic.   BELOW ARE SYMPTOMS THAT SHOULD BE REPORTED IMMEDIATELY:  *FEVER GREATER THAN 100.5 F  *CHILLS WITH OR WITHOUT FEVER  NAUSEA AND VOMITING THAT IS NOT CONTROLLED WITH YOUR NAUSEA MEDICATION  *UNUSUAL SHORTNESS OF BREATH  *UNUSUAL BRUISING OR BLEEDING  TENDERNESS IN MOUTH AND THROAT WITH OR WITHOUT PRESENCE OF ULCERS  *URINARY PROBLEMS  *BOWEL PROBLEMS  UNUSUAL RASH Items with * indicate a potential emergency and should be followed up as soon as possible.  Feel free to call the clinic you have any questions or concerns. The clinic phone number is [phone].  Please show the CHEMO ALERT CARD at check-in to the Emergency Department and triage nurse.    

## 2016-07-17 LAB — AFP TUMOR MARKER: AFP, Serum, Tumor Marker: 4.4 ng/mL (ref 0.0–8.3)

## 2016-08-03 NOTE — Progress Notes (Signed)
[address]  [phone] [phone]  Clinic Follow up Note   Patient Care Team: Antonietta Jewel, MD as PCP - General (Internal Medicine) [name], Cristopher Estimable, MD as Attending Physician (Gastroenterology) Antonietta Jewel, MD as Referring Physician (Internal Medicine) Truitt Merle, MD as Consulting Physician (Hematology) [name], CRNP as Nurse Practitioner (Nurse Practitioner) 08/06/2016   CHIEF COMPLAINTS:  Follow up recurrent [place]   Oncology History   Hepatocellular carcinoma ([place])   Staging form: Liver (Excluding Intrahepatic Bile Ducts), AJCC 7th Edition   - Clinical stage from 04/16/2012: Stage II (T2(m), N0, M0) - Signed by Truitt Merle, MD on 10/12/2015   - Pathologic stage from 04/16/2013: Stage II (T2, N0, cM0) - Signed by Truitt Merle, MD on 10/12/2015      Hepatocellular carcinoma ([place])   05/1927 Imaging         09/14/2012 Tumor Marker    AFP 28.9      04/16/2013 Imaging    Abdominal MRI with and without contrast reviewed multifocal (4) liver lesions in both left and right lobes, most consistent with HCC, largest measuring 2.7 cm      04/16/2013 Initial Diagnosis    Hepatocellular carcinoma ([place])      04/28/2013 Procedure    Right TACE with lipiodol       06/15/2013 Procedure    Left TACE with [facility]       07/14/2013 Procedure    Right TACE with [facility]      09/05/2015 Imaging    CT abdomen with and without contrast showed a new 5.0 x 2.3 cm mass in the hepatic segment 8, invading and occluding the right anterior portal vein, most compatible with [place]. A portacaval node measuring 3.0 x 1.4 cm, previously 2.9 x 1.1 cm.      10/11/2015 Tumor Marker    AFP 5.3      11/11/2015 - 02/03/2016 Chemotherapy    sorafenib 200mg  bid started on 11/11/2015, increased to 400mg  bid in 2 weeks, stopped due to poor tolerance and probable disease progression       11/14/2015 Imaging    CT CAP 11/14/2015 IMPRESSION: Chest Impression: 1. RIGHT upper lobe pulmonary  nodule is indeterminate. No comparison available. 2. Ground-glass opacity and peripheral nodules in the RIGHT middle lobe appear post infectious or inflammatory.  Abdomen / Pelvis Impression: 1. 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 Thrombus also likely within the LEFT and RIGHT portal vein. Difficult to distinguish tumor thrombus versus bland thrombus. 2. Individual lesions are difficult to define in the RIGHT hepatic lobe. There is overall impression of progression of disease in the RIGHT hepatic lobe with multiple ill-defined enhancing lesions. 3. Periportal and shotty retroperitoneal adenopathy similar to prior.      01/25/2016 Imaging    CT Abdomen Pelvis w/ Contrast IMPRESSION: 1. Findings consistent with multifocal liver carcinoma with extensive portal vein thrombosis, and subsequent development of porta hepatis venous collaterals. Thrombus in the central portal vein is no longer expansile, but still expands the more peripheral portal vein and the right and left portal veins. There are few right liver bile ducts there are now dilated, which suggests mild progression of liver disease. 2. There is mild splenomegaly consistent or venous hypertension, which has mildly increased from the prior CT. 3. There are new lung abnormalities with interstitial thickening and ill-defined peribronchovascular nodular opacities mostly at the right lung base with a  smaller area noted along the medial left lower lobe. The etiology may be infectious. It may be inflammatory, possibly related to chemotherapy. Neoplastic disease is also possible. 4. There is no other evidence of metastatic disease within the abdomen or pelvis. No acute findings within the abdomen or pelvis      01/25/2016 - 01/25/2016 Hospital Admission    Pt was seen in ED for worsening abdominal pain, treated with pain meds        02/04/2016 -  Chemotherapy    The patient started on immunotherapy treatment nivolumab every 2 weeks; on 04/30/16 dose was reduced to 420mg  every 3 weeks       02/14/2016 Imaging    CT CAP w/ contrast 1. Slight interval increase in size of the mediastinal lymph nodes. 2. Persistent extensive lower lobe peribronchial thickening and patchy infiltrates. Possible aspiration pneumonia. 3. Persistent tree-in-bud appearance in the right middle lobe, likely chronic inflammation or atypical infection. 4. Improved CT appearance of the liver. Two small residual arterial phase enhancing lesions are identified. 5. Chronic portal vein thrombosis with cavernous transformation of portal venous collaterals. Esophageal varices are again noted. 6. Stable upper abdominal lymph nodes likely related to cirrhosis.       04/16/2016 Tumor Marker    AFP 4.1       04/17/2016 Imaging     CT CAP W PELVIS IMPRESSION: 1. Stable mild mediastinal lymphadenopathy. 2. Interval improvement in the bibasilar peribronchial thickening, airway impaction, and peribronchovascular nodularity. Imaging features suggest marked improvement in bilateral low the atypical infection. Changes in the right middle lobe with more stable and may represent scarring from previous infectious/inflammatory etiology. 3. The small hypervascular lesions seen in the anterior left liver on the previous study are not evident today. There is some subtle, ill-defined hyperenhancement in the central left liver which is nonspecific. Attention to this area on followup imaging recommended. 4. Chronic portal vein inclusion with cavernous transformation in the porta hepatis and paraesophageal varices. 5. Stable appearance hepatoduodenal and retroperitoneal lymphadenopathy, likely chronic and related to liver disease.      04/17/2016 Imaging    CT CAP CONTRAST IMPRESSION: 1. Stable mild mediastinal lymphadenopathy. 2. Interval improvement in the bibasilar  peribronchial thickening, airway impaction, and peribronchovascular nodularity. Imaging features suggest marked improvement in bilateral low the atypical infection. Changes in the right middle lobe with more stable and may represent scarring from previous infectious/inflammatory etiology. 3. The small hypervascular lesions seen in the anterior left liver on the previous study are not evident today. There is some subtle, ill-defined hyperenhancement in the central left liver which is nonspecific. Attention to this area on followup imaging recommended. 4. Chronic portal vein inclusion with cavernous transformation in the porta hepatis and paraesophageal varices. 5. Stable appearance hepatoduodenal and retroperitoneal lymphadenopathy, likely chronic and related to liver disease.      05/21/2016 Imaging    MR Abdomen W WO Contrast IMPRESSION: 1. Limited motion degraded scan. Cirrhosis. No evidence of a liver mass within these limitations. 2. Mild splenomegaly. No ascites. Stable mild paraumbilical and gastroesophageal varices. 3. Stable nonspecific mild retroperitoneal lymphadenopathy. 4. 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      06/04/2016 Tumor Marker    AFP 3.5        HISTORY OF PRESENTING ILLNESS (10/11/2015):  Kevin Dillon 66 y.o. male with past medical history of treated hepatitis C, liver cirrhosis, and hepatocellular carcinoma is here because of recurrent hepatocellular carcinoma. He is  accompanied by his significant other [name] and her sister.  He was diagnosed with hepatocellular carcinoma in 2015, his initial image result is not available and staging is unknown. He was seen by interventional radiologist Dr. Delice Lesch at [facility] in [place] and underwent TACE procedure 3 times in 2015. He was subsequently followed, and recently repeated CT scan showed a new 5.0 cm mass in segment 8, with portal vein invasion. He was  felt not to be a candidate for further liver targeted therapy, and was referred to [place] for further systemic therapy.  He complains about mid epigastric pain for the past 2 years, which significantly improved after his TACE procedure in 2015. It has been getting worse lately in the past 6 months. He states he gets about 7-8 out of 10, persistent, he has been taking oxycodone 20 mg every 6 hours, but his pain is not well controlled. He is quite fatigued, is low appetite. He last 40 lbs in the pat 4 months. He is able to function at home, in trying to remain to be physically active.  His hepatitis C was successfully treated. He has history of hepatitis C, complicated with ascites and encephalopathy, but it has been well controlled with medical management.   CURRENT THERAPY: Nivolumab 240mg  every 2 weeks, started on 02/04/2015; on 04/30/16 switched to every 3 weeks due to fatigue  INTERIM HISTORY: Myrle returns for follow up. Since his last treatment he has been a bit more tired. His pain is being tolerated with oxycotin 3x day and oxycodone about 6x a day. Denies any abnormal bowel movements. He has noticed that his belly is bigger compared to our last visit but denies any associated pain or coughs.   MEDICAL HISTORY:  Past Medical History:  Diagnosis Date  . Cancer ([place])    liver  . Cholelithiasis   . Chronic hepatitis C ([place])   . Chronic knee pain   . Chronic left shoulder pain   . CVA (cerebral infarction)   . Gout   . Hepatitis C    genotype 1b.  pt has been vaccinated against hep A and B.  . Hypertension   . Osteoarthritis   . Schatzki's ring   . Tubular adenoma of colon 12/2011    SURGICAL HISTORY: Past Surgical History:  Procedure Laterality Date  . AMPUTATION Left 09/17/2012   Procedure: SMALL FINGER EXTENSOR TENDON REPAIR; METACARPAL LEVEL AMPUTATION RING FINGER; PROXIMAL PHALANX LEVEL AMPUTATION LONG FINGER;  Surgeon: Schuyler Amor, MD;  Location: [place];  Service:  Orthopedics;  Laterality: Left;  . Arm surgery     right/plate in arm  . COLONOSCOPY WITH ESOPHAGOGASTRODUODENOSCOPY (EGD)  12/30/2011   RMR: Noncritical Schatzki's ring;  Hiatal hernia, Tubular ADENOMA removed from splenic flexure, otherwise normal colonoscopy  . LEG SURGERY     left  . WOUND EXPLORATION Left 09/17/2012   Procedure: WOUND EXPLORATION;  Surgeon: Schuyler Amor, MD;  Location: [place];  Service: Orthopedics;  Laterality: Left;    SOCIAL HISTORY: Social History   Social History  . Marital status: Single    Spouse name: N/A  . Number of children: 0  . Years of education: N/A   Occupational History  . disabled    Social History Main Topics  . Smoking status: Former Smoker    Packs/day: 0.50    Years: 40.00    Types: Cigarettes    Quit date: 01/19/2012  . Smokeless tobacco: Never Used     Comment: Smokes 1/2  pack of cigarettes daily  . Alcohol use No     Comment: HX 2 beers 3-4 days per week; QUIT DEC 2013  . Drug use: No     Comment: Hx cocaine yrs ago, Last marijuana OCT 2013  . Sexual activity: Not Currently    Partners: Female   Other Topics Concern  . Not on file   Social History Narrative   Lives w/ significant other, [name]    FAMILY HISTORY: Family History  Problem Relation Age of Onset  . Cirrhosis Father 31  . Cancer Father        liver cancer   . Lung cancer Mother 40  . Colon cancer Neg Hx     ALLERGIES:  has No Known Allergies.  MEDICATIONS:  Current Outpatient Prescriptions  Medication Sig Dispense Refill  . ALPRAZolam (XANAX) 1 MG tablet Take 1 tablet (1 mg total) by mouth 3 (three) times daily as needed for anxiety. 90 tablet 0  . hydrALAZINE (APRESOLINE) 25 MG tablet Take 50 mg by mouth daily.     Marland Kitchen lactulose (CHRONULAC) 10 GM/15ML solution 30 g daily as needed for moderate constipation.     . megestrol (MEGACE) 40 MG/ML suspension Take 400 mg by mouth daily.     . Multiple Vitamins-Minerals (CENTRUM SILVER PO) Take  1 tablet by mouth daily.     Marland Kitchen oxyCODONE (OXYCONTIN) 80 mg 12 hr tablet Take 1 tablet (80 mg total) by mouth every 8 (eight) hours. 90 tablet 0  . oxycodone (ROXICODONE) 30 MG immediate release tablet Take 1 tablet (30 mg total) by mouth every 4 (four) hours as needed for pain. 120 tablet 0  . PARoxetine (PAXIL) 20 MG tablet Take 20 mg by mouth every morning.     . prochlorperazine (COMPAZINE) 10 MG tablet Take 1 tablet (10 mg total) by mouth every 8 (eight) hours as needed for nausea or vomiting. 30 tablet 1  . rivaroxaban (XARELTO) 20 MG TABS tablet Take 1 tablet (20 mg total) by mouth daily with supper. 30 tablet 2   No current facility-administered medications for this visit.     REVIEW OF SYSTEMS:  Constitutional: Denies fevers, chills or abnormal night sweats   Eyes: Denies blurriness of vision, double vision or watery eyes Ears, nose, mouth, throat, and face: Denies mucositis or sore throat Respiratory: Denies cough, dyspnea or wheezes Cardiovascular: Denies palpitation, chest discomfort or lower extremity swelling Gastrointestinal:  Denies nausea, heartburn or change in bowel habits,  Skin: Denies abnormal skin rashes Lymphatics: Denies new lymphadenopathy or easy bruising Neurological:Denies numbness, tingling or new weaknesses Behavioral/Psych: normal Muscuoskeletal: (+) back pain  All other systems were reviewed with the patient and are negative.  PHYSICAL EXAMINATION  ECOG PERFORMANCE STATUS: 2  Vitals:   08/06/16 1402  BP: (!) 128/96  Pulse: 87  Resp: 18  Temp: 97.7 F (36.5 C)   Filed Weights   08/06/16 1402  Weight: 195 lb 6.4 oz (88.6 kg)    GENERAL:alert,  SKIN: skin color, texture, turgor are normal, no rashes or significant lesions EYES: normal, conjunctiva are pink and non-injected, sclera clear OROPHARYNX:no exudate, no erythema and lips, buccal mucosa, and tongue normal  NECK: supple, thyroid normal size, non-tender, without nodularity LYMPH:  no  palpable lymphadenopathy in the cervical, axillary or inguinal LUNGS: clear to auscultation and percussion with normal breathing effort HEART: regular rate & rhythm and no murmurs and no lower extremity edema ABDOMEN:(+) abdominal bloating. distenend possible ascitis Musculoskeletal:no cyanosis of digits  and no clubbing  PSYCH: alert & oriented x 3 with fluent speech NEURO: no focal motor/sensory deficits  LABORATORY DATA:  I have reviewed the data as listed CBC Latest Ref Rng & Units 08/06/2016 07/16/2016 06/25/2016  WBC 4.0 - 10.3 10e3/uL 7.5 8.8 9.1  Hemoglobin 13.0 - 17.1 g/dL 12.0(L) 11.7(L) 12.5(L)  Hematocrit 38.4 - 49.9 % 34.6(L) 33.8(L) 36.2(L)  Platelets 140 - 400 10e3/uL 129(L) 204 152   CMP Latest Ref Rng & Units 08/06/2016 07/16/2016 06/25/2016  Glucose 70 - 140 mg/dl 129 137 125  BUN 7.0 - 26.0 mg/dL 17.1 15.7 15.9  Creatinine 0.7 - 1.3 mg/dL 1.4(H) 1.5(H) 1.5(H)  Sodium 136 - 145 mEq/L 138 138 139  Potassium 3.5 - 5.1 mEq/L 3.7 3.8 4.2  Chloride 101 - 111 mmol/L - - -  CO2 22 - 29 mEq/L 22 21(L) 25  Calcium 8.4 - 10.4 mg/dL 9.4 9.2 9.6  Total Protein 6.4 - 8.3 g/dL 7.7 7.6 8.0  Total Bilirubin 0.20 - 1.20 mg/dL 0.51 0.39 0.62  Alkaline Phos 40 - 150 U/L 98 103 101  AST 5 - 34 U/L 23 25 30   ALT 0 - 55 U/L 13 14 19    AFP: 09/14/2012: 28.9 10/11/2015: 5.3 11/01/2015: 3.7 12/02/2015: 3.7 01/22/2016: 3.4 02/18/2016: 7.1 03/17/16: 5.8 04/16/16: 4.1 06/04/2016: 3.5 07/16/2016:4.4  RADIOGRAPHIC STUDIES: I have personally reviewed the radiological images as listed and agreed with the findings in the report.  04/17/16 CT CAP W PELVIS IMPRESSION: 1. Stable mild mediastinal lymphadenopathy. 2. Interval improvement in the bibasilar peribronchial thickening, airway impaction, and peribronchovascular nodularity. Imaging features suggest marked improvement in bilateral low the atypical infection. Changes in the right middle lobe with more stable and may represent scarring from  previous infectious/inflammatory etiology. 3. The small hypervascular lesions seen in the anterior left liver on the previous study are not evident today. There is some subtle, ill-defined hyperenhancement in the central left liver which is nonspecific. Attention to this area on followup imaging recommended. 4. Chronic portal vein inclusion with cavernous transformation in the porta hepatis and paraesophageal varices. 5. Stable appearance hepatoduodenal and retroperitoneal lymphadenopathy, likely chronic and related to liver disease.   MR Abdomen W WO Contrast 05/21/16 IMPRESSION: 1. Limited motion degraded scan. Cirrhosis. No evidence of a liver mass within these limitations. 2. Mild splenomegaly. No ascites. Stable mild paraumbilical and gastroesophageal varices. 3. Stable nonspecific mild retroperitoneal lymphadenopathy. 4. 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  ASSESSMENT & PLAN: 66 y.o. Caucasian male with past medical history of successfully treated hepatitis C, liver cirrhosis, history of ascites and encephalopathy, recurrent HCC  1. Recurrent HCC, initially stage II -I previously reviewed his previous image and outside medical records extensively, confirmed key findings with patient and his family members -He initially had a multifocal disease, status post TACE 3 times in 2015 -he now has developed symptomatic local recurrence, with a 5cm new lesion in segment 8 with direct invasion into portal vein, and a portocaval lymph node. He is not a candidate for liver targeted therapy per his IR Dr. Delice Lesch -He was started on first line srafenib, tolerated poorly. His previous CT abdomen and pelvis on 01/25/2016 revealed extensive portal hypertension, and a probable cancer progression in the liver. -His treatment has changed to immunotherapy Nivolumab, he tolerated very well, and clinically he is doing much better, with less pain and better  energy. Continue.  -I previously discussed his restaging CT scan which was done on 02/14/2016,  it actually showed decreased liver cancer size compared to the scan a month ago. No other new metastasis. -restaging CT from 04/17/2016 showed no visible liver mass, but the imaging was very limited due to his liver cirrhosis  -I previously reviewed his recent abdominal MRI from 05/21/2016, which showed no visible mass in the liver. He has had complete radiographic response. -Nivo has previously been changed to every 3 weeks, per pt's request due to fatigue and other side effects -his overall clinically doing well, we'll continue treatment. - Labs reviewed, He is adequate for treatment today and will continue every 3 weeks  - f/u in 6 weeks  -plan to repeat abd MRI in 08/2016 -he has been more fatigued, and gained weight lately, suspicious ascites on exam today  -  His thyroid function is low at times. I will refer him to endocrinologist, Dr. Ebony Hail   2. Abdominal pain/ back pain -Secondary to [facility] and portal vein thrombosis -overall better now with oxycodone and OxyContin, he is on very high dose, overall pain is controlled  -I previously recommended him to see pain management clinic, he declined. -he is taking oxycodone 6-7 pain pills a day. I previously encouraged him to continue the OxyContin, but to only take the oxycodone PRN.  - Patient currently takes OxyContin 3 times daily, and oxycodone as needed, his pain overall is controlled.   3. 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 Hep C, successfully treated -Continue follow-up with liver clinic NP [facility]  6. Anxiety and insomnia  -He has Xanax for anxiety. I previously refilled Xanax 1 mg. -continue Ativan 1 mg at bedtime as needed for insomnia, he knows not to take with Xanax together, he knows not to take during the day. He does not feel Ativan is working well for sleep, he may take xanax for insomnia   7. Fatigue and weight gain -He reports more fatigue and weak and lately -His TSH has been moderately elevated lately, we discussed the side effect of hypothyroidism from Nivolumab, I will refer him to Dr. Loanne Drilling for evaluation to see if he needs low-dose Synthroid  8. HTN -Continue medication for follow-up of his primary care physician  9. Smoking - Patient has started back smoking due to the fear of not getting better - I previously strongly recommended a smoking cessation program. He refused.  10. Arthralgia -Patient has developed significant arthralgia, possible related to Nivolumab. It has much improved this week. Exam was unremarkable. - Patient takes OxyContin to help and oxycodone prn - I advised he can take up to ibuprofen 3 a day - If pain worsens, I may try a short course of steroids  11. Depression - The patient reports feelings of depression related to his diagnosis. - He reports fears he is going to die soon. - I previously encouraged the patient that his recent scan shows an excellent response to treatment.  12. Goal of care discussion, DNR/DNI  -We previously discussed the incurable nature of his cancer, and the overall poor prognosis,  especially if he does not have good response to chemotherapy or progress on chemo -The patient understands the goal of care is palliative. -he agrees with DNR/DNI    Plan  - refill oxycodone 30mg , oxycontin 80mg  and Xanax 1 mg today - Proceed with Nivolumab infusion today, and every 3 weeks. -  refer to dr. Ebony Hail - abdominal MRI in 2 weeks   All questions were answered. The patient knows to call the clinic with any problems, questions or concerns.  I spent 20 minutes counseling the patient face to face. The total time spent in the appointment was 30 minutes and more than 50% was on counseling.  This document serves as a record of services personally performed by Truitt Merle, MD. It was created on her behalf by Brandt Loosen, a trained medical scribe. The creation of this record is based on the scribe's personal observations and the provider's statements to them. This document has been checked and approved by the attending provider.   I have reviewed the above documentation for accuracy and completeness and I agree with the above.   Truitt Merle, MD 08/06/2016

## 2016-08-06 ENCOUNTER — Ambulatory Visit (HOSPITAL_BASED_OUTPATIENT_CLINIC_OR_DEPARTMENT_OTHER): Payer: Medicare Other | Admitting: Hematology

## 2016-08-06 ENCOUNTER — Telehealth: Payer: Self-pay | Admitting: Hematology

## 2016-08-06 ENCOUNTER — Ambulatory Visit (HOSPITAL_BASED_OUTPATIENT_CLINIC_OR_DEPARTMENT_OTHER): Payer: Medicare Other

## 2016-08-06 ENCOUNTER — Encounter: Payer: Self-pay | Admitting: Hematology

## 2016-08-06 ENCOUNTER — Other Ambulatory Visit (HOSPITAL_BASED_OUTPATIENT_CLINIC_OR_DEPARTMENT_OTHER): Payer: Medicare Other

## 2016-08-06 VITALS — BP 128/96 | HR 87 | Temp 97.7°F | Resp 18 | Ht 73.0 in | Wt 195.4 lb

## 2016-08-06 DIAGNOSIS — I81 Portal vein thrombosis: Secondary | ICD-10-CM

## 2016-08-06 DIAGNOSIS — Z7901 Long term (current) use of anticoagulants: Secondary | ICD-10-CM | POA: Diagnosis not present

## 2016-08-06 DIAGNOSIS — Z5112 Encounter for antineoplastic immunotherapy: Secondary | ICD-10-CM | POA: Diagnosis present

## 2016-08-06 DIAGNOSIS — C220 Liver cell carcinoma: Secondary | ICD-10-CM

## 2016-08-06 DIAGNOSIS — G893 Neoplasm related pain (acute) (chronic): Secondary | ICD-10-CM | POA: Diagnosis not present

## 2016-08-06 DIAGNOSIS — K7469 Other cirrhosis of liver: Secondary | ICD-10-CM | POA: Diagnosis not present

## 2016-08-06 DIAGNOSIS — I10 Essential (primary) hypertension: Secondary | ICD-10-CM | POA: Diagnosis not present

## 2016-08-06 DIAGNOSIS — Z8619 Personal history of other infectious and parasitic diseases: Secondary | ICD-10-CM

## 2016-08-06 DIAGNOSIS — E039 Hypothyroidism, unspecified: Secondary | ICD-10-CM

## 2016-08-06 LAB — CBC WITH DIFFERENTIAL/PLATELET
BASO%: 0.3 % (ref 0.0–2.0)
BASOS ABS: 0 10*3/uL (ref 0.0–0.1)
EOS ABS: 0.2 10*3/uL (ref 0.0–0.5)
EOS%: 2.1 % (ref 0.0–7.0)
HCT: 34.6 % — ABNORMAL LOW (ref 38.4–49.9)
HGB: 12 g/dL — ABNORMAL LOW (ref 13.0–17.1)
LYMPH%: 31.8 % (ref 14.0–49.0)
MCH: 30.2 pg (ref 27.2–33.4)
MCHC: 34.7 g/dL (ref 32.0–36.0)
MCV: 87.2 fL (ref 79.3–98.0)
MONO#: 0.6 10*3/uL (ref 0.1–0.9)
MONO%: 7.9 % (ref 0.0–14.0)
NEUT#: 4.3 10*3/uL (ref 1.5–6.5)
NEUT%: 57.9 % (ref 39.0–75.0)
PLATELETS: 129 10*3/uL — AB (ref 140–400)
RBC: 3.97 10*6/uL — AB (ref 4.20–5.82)
RDW: 14.1 % (ref 11.0–14.6)
WBC: 7.5 10*3/uL (ref 4.0–10.3)
lymph#: 2.4 10*3/uL (ref 0.9–3.3)

## 2016-08-06 LAB — COMPREHENSIVE METABOLIC PANEL
ALT: 13 U/L (ref 0–55)
AST: 23 U/L (ref 5–34)
Albumin: 3.7 g/dL (ref 3.5–5.0)
Alkaline Phosphatase: 98 U/L (ref 40–150)
Anion Gap: 10 mEq/L (ref 3–11)
BUN: 17.1 mg/dL (ref 7.0–26.0)
CO2: 22 meq/L (ref 22–29)
Calcium: 9.4 mg/dL (ref 8.4–10.4)
Chloride: 106 mEq/L (ref 98–109)
Creatinine: 1.4 mg/dL — ABNORMAL HIGH (ref 0.7–1.3)
EGFR: 52 mL/min/{1.73_m2} — AB (ref >90)
Glucose: 129 mg/dl (ref 70–140)
POTASSIUM: 3.7 meq/L (ref 3.5–5.1)
SODIUM: 138 meq/L (ref 136–145)
Total Bilirubin: 0.51 mg/dL (ref 0.20–1.20)
Total Protein: 7.7 g/dL (ref 6.4–8.3)

## 2016-08-06 MED ORDER — SODIUM CHLORIDE 0.9 % IV SOLN
Freq: Once | INTRAVENOUS | Status: AC
  Administered 2016-08-06: 15:00:00 via INTRAVENOUS

## 2016-08-06 MED ORDER — OXYCODONE HCL 30 MG PO TABS: 30.0000 mg | ORAL_TABLET | ORAL | 0 refills | Status: DC | PRN

## 2016-08-06 MED ORDER — ALPRAZOLAM 1 MG PO TABS: 1.0000 mg | ORAL_TABLET | Freq: Three times a day (TID) | ORAL | 0 refills | Status: DC | PRN

## 2016-08-06 MED ORDER — SODIUM CHLORIDE 0.9 % IV SOLN
240.0000 mg | Freq: Once | INTRAVENOUS | Status: AC
  Administered 2016-08-06: 240 mg via INTRAVENOUS
  Filled 2016-08-06: qty 24

## 2016-08-06 MED ORDER — OXYCODONE HCL ER 80 MG PO T12A: 80.0000 mg | EXTENDED_RELEASE_TABLET | Freq: Three times a day (TID) | ORAL | 0 refills | Status: DC

## 2016-08-06 NOTE — Patient Instructions (Signed)
[facility] Discharge Instructions for Patients Receiving Chemotherapy  Today you received the following chemotherapy agents Opdivo.  To help prevent nausea and vomiting after your treatment, we encourage you to take your nausea medication as directed.   If you develop nausea and vomiting that is not controlled by your nausea medication, call the clinic.   BELOW ARE SYMPTOMS THAT SHOULD BE REPORTED IMMEDIATELY:  *FEVER GREATER THAN 100.5 F  *CHILLS WITH OR WITHOUT FEVER  NAUSEA AND VOMITING THAT IS NOT CONTROLLED WITH YOUR NAUSEA MEDICATION  *UNUSUAL SHORTNESS OF BREATH  *UNUSUAL BRUISING OR BLEEDING  TENDERNESS IN MOUTH AND THROAT WITH OR WITHOUT PRESENCE OF ULCERS  *URINARY PROBLEMS  *BOWEL PROBLEMS  UNUSUAL RASH Items with * indicate a potential emergency and should be followed up as soon as possible.  Feel free to call the clinic you have any questions or concerns. The clinic phone number is [phone].  Please show the CHEMO ALERT CARD at check-in to the Emergency Department and triage nurse.    

## 2016-08-06 NOTE — Telephone Encounter (Signed)
Scheduled appt per 7/19 los - Patient called back to treatment before schedule was finished - patient to get new schedule in treatment area.

## 2016-08-09 ENCOUNTER — Encounter: Payer: Self-pay | Admitting: Hematology

## 2016-08-09 NOTE — Addendum Note (Signed)
Addended by: Truitt Merle on: 08/09/2016 04:24 PM   Modules accepted: Orders

## 2016-08-10 DIAGNOSIS — K7469 Other cirrhosis of liver: Secondary | ICD-10-CM | POA: Diagnosis not present

## 2016-08-10 DIAGNOSIS — C220 Liver cell carcinoma: Secondary | ICD-10-CM | POA: Diagnosis not present

## 2016-08-10 DIAGNOSIS — R188 Other ascites: Secondary | ICD-10-CM | POA: Diagnosis not present

## 2016-08-10 MED FILL — FUROSEMIDE 20 MG TAB: 20 | 30 days supply | Qty: 30 | Fill #0

## 2016-08-14 ENCOUNTER — Other Ambulatory Visit: Payer: Self-pay | Admitting: Hematology

## 2016-08-14 MED FILL — ALPRAZolam 1 MG TABS: 1 | 30 days supply | Qty: 90 | Fill #0

## 2016-08-14 MED FILL — oxyCODONE HCL 30 MG TABS: 30 | 20 days supply | Qty: 120 | Fill #0

## 2016-08-14 MED FILL — OxyCONTIN 80 MG T12A: 80 | 30 days supply | Qty: 90 | Fill #0

## 2016-08-25 ENCOUNTER — Ambulatory Visit (HOSPITAL_COMMUNITY)
Admission: RE | Admit: 2016-08-25 | Discharge: 2016-08-25 | Disposition: A | Discharge status: Home / Self Care | Payer: Medicare Other | Source: Ambulatory Visit | Attending: Hematology | Admitting: Hematology

## 2016-08-25 DIAGNOSIS — K7460 Unspecified cirrhosis of liver: Secondary | ICD-10-CM | POA: Diagnosis not present

## 2016-08-25 DIAGNOSIS — R590 Localized enlarged lymph nodes: Secondary | ICD-10-CM | POA: Diagnosis not present

## 2016-08-25 DIAGNOSIS — C220 Liver cell carcinoma: Secondary | ICD-10-CM

## 2016-08-25 DIAGNOSIS — K8020 Calculus of gallbladder without cholecystitis without obstruction: Secondary | ICD-10-CM | POA: Diagnosis not present

## 2016-08-25 DIAGNOSIS — K766 Portal hypertension: Secondary | ICD-10-CM | POA: Insufficient documentation

## 2016-08-25 MED ORDER — GADOXETATE DISODIUM 0.25 MMOL/ML IV SOLN
10.0000 mL | Freq: Once | INTRAVENOUS | Status: AC | PRN
  Administered 2016-08-25: 7 mL via INTRAVENOUS

## 2016-08-27 ENCOUNTER — Ambulatory Visit: Payer: Medicare Other

## 2016-08-27 ENCOUNTER — Encounter: Payer: Self-pay | Admitting: Hematology

## 2016-08-27 ENCOUNTER — Telehealth: Payer: Self-pay | Admitting: Hematology

## 2016-08-27 ENCOUNTER — Ambulatory Visit (HOSPITAL_BASED_OUTPATIENT_CLINIC_OR_DEPARTMENT_OTHER): Payer: Medicare Other | Admitting: Hematology

## 2016-08-27 ENCOUNTER — Other Ambulatory Visit (HOSPITAL_BASED_OUTPATIENT_CLINIC_OR_DEPARTMENT_OTHER): Payer: Medicare Other

## 2016-08-27 VITALS — BP 183/87 | HR 84 | Temp 98.2°F | Resp 20 | Ht 73.0 in | Wt 201.1 lb

## 2016-08-27 DIAGNOSIS — F329 Major depressive disorder, single episode, unspecified: Secondary | ICD-10-CM

## 2016-08-27 DIAGNOSIS — R530 Neoplastic (malignant) related fatigue: Secondary | ICD-10-CM

## 2016-08-27 DIAGNOSIS — K7469 Other cirrhosis of liver: Secondary | ICD-10-CM | POA: Diagnosis not present

## 2016-08-27 DIAGNOSIS — E039 Hypothyroidism, unspecified: Secondary | ICD-10-CM

## 2016-08-27 DIAGNOSIS — G893 Neoplasm related pain (acute) (chronic): Secondary | ICD-10-CM

## 2016-08-27 DIAGNOSIS — Z8619 Personal history of other infectious and parasitic diseases: Secondary | ICD-10-CM

## 2016-08-27 DIAGNOSIS — G4700 Insomnia, unspecified: Secondary | ICD-10-CM

## 2016-08-27 DIAGNOSIS — F419 Anxiety disorder, unspecified: Secondary | ICD-10-CM

## 2016-08-27 DIAGNOSIS — C220 Liver cell carcinoma: Secondary | ICD-10-CM | POA: Diagnosis not present

## 2016-08-27 DIAGNOSIS — R635 Abnormal weight gain: Secondary | ICD-10-CM | POA: Diagnosis not present

## 2016-08-27 LAB — CBC WITH DIFFERENTIAL/PLATELET
BASO%: 0.3 % (ref 0.0–2.0)
Basophils Absolute: 0 10*3/uL (ref 0.0–0.1)
EOS%: 2.1 % (ref 0.0–7.0)
Eosinophils Absolute: 0.2 10*3/uL (ref 0.0–0.5)
HCT: 36.2 % — ABNORMAL LOW (ref 38.4–49.9)
HEMOGLOBIN: 12 g/dL — AB (ref 13.0–17.1)
LYMPH%: 30 % (ref 14.0–49.0)
MCH: 30 pg (ref 27.2–33.4)
MCHC: 33.1 g/dL (ref 32.0–36.0)
MCV: 90.5 fL (ref 79.3–98.0)
MONO#: 0.7 10*3/uL (ref 0.1–0.9)
MONO%: 7.4 % (ref 0.0–14.0)
NEUT%: 60.2 % (ref 39.0–75.0)
NEUTROS ABS: 5.8 10*3/uL (ref 1.5–6.5)
Platelets: 153 10*3/uL (ref 140–400)
RBC: 4 10*6/uL — ABNORMAL LOW (ref 4.20–5.82)
RDW: 14.3 % (ref 11.0–14.6)
WBC: 9.7 10*3/uL (ref 4.0–10.3)
lymph#: 2.9 10*3/uL (ref 0.9–3.3)

## 2016-08-27 LAB — COMPREHENSIVE METABOLIC PANEL
ALBUMIN: 3.8 g/dL (ref 3.5–5.0)
ALK PHOS: 92 U/L (ref 40–150)
ALT: 23 U/L (ref 0–55)
AST: 29 U/L (ref 5–34)
Anion Gap: 10 mEq/L (ref 3–11)
BUN: 18.9 mg/dL (ref 7.0–26.0)
CO2: 25 mEq/L (ref 22–29)
Calcium: 9.5 mg/dL (ref 8.4–10.4)
Chloride: 106 mEq/L (ref 98–109)
Creatinine: 1.5 mg/dL — ABNORMAL HIGH (ref 0.7–1.3)
EGFR: 49 mL/min/{1.73_m2} — ABNORMAL LOW (ref >90)
GLUCOSE: 110 mg/dL (ref 70–140)
POTASSIUM: 4.1 meq/L (ref 3.5–5.1)
SODIUM: 140 meq/L (ref 136–145)
Total Bilirubin: 0.55 mg/dL (ref 0.20–1.20)
Total Protein: 7.8 g/dL (ref 6.4–8.3)

## 2016-08-27 LAB — TSH: TSH: 6.128 m[IU]/L — AB (ref 0.320–4.118)

## 2016-08-27 MED ORDER — ALPRAZOLAM 1 MG PO TABS: 1.0000 mg | ORAL_TABLET | Freq: Three times a day (TID) | ORAL | 0 refills | Status: DC | PRN

## 2016-08-27 MED ORDER — CYCLOBENZAPRINE HCL 5 MG PO TABS
5.0000 mg | ORAL_TABLET | Freq: Three times a day (TID) | ORAL | 1 refills | Status: DC | PRN
Start: 2016-08-27 — End: 2016-10-22

## 2016-08-27 MED ORDER — OXYCODONE HCL ER 80 MG PO T12A: 160.0000 mg | EXTENDED_RELEASE_TABLET | Freq: Two times a day (BID) | ORAL | 0 refills | Status: DC

## 2016-08-27 MED ORDER — OXYCODONE HCL 30 MG PO TABS: 30.0000 mg | ORAL_TABLET | ORAL | 0 refills | Status: DC | PRN

## 2016-08-27 MED ORDER — CYCLOBENZAPRINE HCL 5 MG PO TABS: 5.0000 mg | ORAL_TABLET | Freq: Three times a day (TID) | ORAL | 1 refills | Status: DC | PRN

## 2016-08-27 MED FILL — CYCLOBENZAPRINE 5 MG TABLET: 5 | 10 days supply | Qty: 30 | Fill #0

## 2016-08-27 NOTE — Progress Notes (Signed)
[place]  Telephone:([phone] [phone]  Clinic Follow up Note   Patient Care Team: Antonietta Jewel, MD as PCP - General (Internal Medicine) [name], Cristopher Estimable, MD as Attending Physician (Gastroenterology) Antonietta Jewel, MD as Referring Physician (Internal Medicine) Truitt Merle, MD as Consulting Physician (Hematology) [name], CRNP as Nurse Practitioner (Nurse Practitioner) 08/27/2016   CHIEF COMPLAINTS:  Follow up recurrent [place]   Oncology History   Hepatocellular carcinoma [facility])   Staging form: Liver (Excluding Intrahepatic Bile Ducts), AJCC 7th Edition   - Clinical stage from 04/16/2012: Stage II (T2(m), N0, M0) - Signed by Truitt Merle, MD on 10/12/2015   - Pathologic stage from 04/16/2013: Stage II (T2, N0, cM0) - Signed by Truitt Merle, MD on 10/12/2015      Hepatocellular carcinoma ([place])   05/1927 Imaging         09/14/2012 Tumor Marker    AFP 28.9      04/16/2013 Imaging    Abdominal MRI with and without contrast reviewed multifocal (4) liver lesions in both left and right lobes, most consistent with HCC, largest measuring 2.7 cm      04/16/2013 Initial Diagnosis    Hepatocellular carcinoma ([place])      04/28/2013 Procedure    Right TACE with lipiodol       06/15/2013 Procedure    Left TACE with [facility]       07/14/2013 Procedure    Right TACE with [facility]      09/05/2015 Imaging    CT abdomen with and without contrast showed a new 5.0 x 2.3 cm mass in the hepatic segment 8, invading and occluding the right anterior portal vein, most compatible with [place]. A portacaval node measuring 3.0 x 1.4 cm, previously 2.9 x 1.1 cm.      10/11/2015 Tumor Marker    AFP 5.3      11/11/2015 - 02/03/2016 Chemotherapy    sorafenib 200mg  bid started on 11/11/2015, increased to 400mg  bid in 2 weeks, stopped due to poor tolerance and probable disease progression       11/14/2015 Imaging    CT CAP 11/14/2015 IMPRESSION: Chest Impression: 1. RIGHT upper lobe pulmonary  nodule is indeterminate. No comparison available. 2. Ground-glass opacity and peripheral nodules in the RIGHT middle lobe appear post infectious or inflammatory.  Abdomen / Pelvis Impression: 1. 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 Thrombus also likely within the LEFT and RIGHT portal vein. Difficult to distinguish tumor thrombus versus bland thrombus. 2. Individual lesions are difficult to define in the RIGHT hepatic lobe. There is overall impression of progression of disease in the RIGHT hepatic lobe with multiple ill-defined enhancing lesions. 3. Periportal and shotty retroperitoneal adenopathy similar to prior.      01/25/2016 Imaging    CT Abdomen Pelvis w/ Contrast IMPRESSION: 1. Findings consistent with multifocal liver carcinoma with extensive portal vein thrombosis, and subsequent development of porta hepatis venous collaterals. Thrombus in the central portal vein is no longer expansile, but still expands the more peripheral portal vein and the right and left portal veins. There are few right liver bile ducts there are now dilated, which suggests mild progression of liver disease. 2. There is mild splenomegaly consistent or venous hypertension, which has mildly increased from the prior CT. 3. There are new lung abnormalities with interstitial thickening and ill-defined peribronchovascular nodular opacities mostly at the right lung base with a  smaller area noted along the medial left lower lobe. The etiology may be infectious. It may be inflammatory, possibly related to chemotherapy. Neoplastic disease is also possible. 4. There is no other evidence of metastatic disease within the abdomen or pelvis. No acute findings within the abdomen or pelvis      01/25/2016 - 01/25/2016 Hospital Admission    Pt was seen in ED for worsening abdominal pain, treated with pain meds        02/04/2016 -  Chemotherapy    The patient started on immunotherapy treatment nivolumab every 2 weeks; on 04/30/16 dose was reduced to 420mg  every 3 weeks       02/14/2016 Imaging    CT CAP w/ contrast 1. Slight interval increase in size of the mediastinal lymph nodes. 2. Persistent extensive lower lobe peribronchial thickening and patchy infiltrates. Possible aspiration pneumonia. 3. Persistent tree-in-bud appearance in the right middle lobe, likely chronic inflammation or atypical infection. 4. Improved CT appearance of the liver. Two small residual arterial phase enhancing lesions are identified. 5. Chronic portal vein thrombosis with cavernous transformation of portal venous collaterals. Esophageal varices are again noted. 6. Stable upper abdominal lymph nodes likely related to cirrhosis.       04/16/2016 Tumor Marker    AFP 4.1       04/17/2016 Imaging     CT CAP W PELVIS IMPRESSION: 1. Stable mild mediastinal lymphadenopathy. 2. Interval improvement in the bibasilar peribronchial thickening, airway impaction, and peribronchovascular nodularity. Imaging features suggest marked improvement in bilateral low the atypical infection. Changes in the right middle lobe with more stable and may represent scarring from previous infectious/inflammatory etiology. 3. The small hypervascular lesions seen in the anterior left liver on the previous study are not evident today. There is some subtle, ill-defined hyperenhancement in the central left liver which is nonspecific. Attention to this area on followup imaging recommended. 4. Chronic portal vein inclusion with cavernous transformation in the porta hepatis and paraesophageal varices. 5. Stable appearance hepatoduodenal and retroperitoneal lymphadenopathy, likely chronic and related to liver disease.      04/17/2016 Imaging    CT CAP CONTRAST IMPRESSION: 1. Stable mild mediastinal lymphadenopathy. 2. Interval improvement in the bibasilar  peribronchial thickening, airway impaction, and peribronchovascular nodularity. Imaging features suggest marked improvement in bilateral low the atypical infection. Changes in the right middle lobe with more stable and may represent scarring from previous infectious/inflammatory etiology. 3. The small hypervascular lesions seen in the anterior left liver on the previous study are not evident today. There is some subtle, ill-defined hyperenhancement in the central left liver which is nonspecific. Attention to this area on followup imaging recommended. 4. Chronic portal vein inclusion with cavernous transformation in the porta hepatis and paraesophageal varices. 5. Stable appearance hepatoduodenal and retroperitoneal lymphadenopathy, likely chronic and related to liver disease.      05/21/2016 Imaging    MR Abdomen W WO Contrast IMPRESSION: 1. Limited motion degraded scan. Cirrhosis. No evidence of a liver mass within these limitations. 2. Mild splenomegaly. No ascites. Stable mild paraumbilical and gastroesophageal varices. 3. Stable nonspecific mild retroperitoneal lymphadenopathy. 4. 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      06/04/2016 Tumor Marker    AFP 3.5       08/25/2016 Imaging    MR Abdomen w wo contrast  IMPRESSION: 1. Mild-to-moderate motion degradation, preferentially involving the pre and postcontrast dynamic images. On follow-up, given the extent of motion on this exam, multiphase  CT may be preferred. 2. Given these limitations, no evidence of hepatocellular carcinoma. 3. Marked cirrhosis and portal venous hypertension. 4. Slight increase in abdominal adenopathy, which is most likely reactive. Recommend attention on follow-up. 5. Cholelithiasis.        HISTORY OF PRESENTING ILLNESS (10/11/2015):  Kevin Dillon 66 y.o. male with past medical history of treated hepatitis C, liver cirrhosis, and hepatocellular  carcinoma is here because of recurrent hepatocellular carcinoma. He is accompanied by his significant other Kevin Dillon and her sister.  He was diagnosed with hepatocellular carcinoma in 2015, his initial image result is not available and staging is unknown. He was seen by interventional radiologist Dr. Delice Lesch at [facility] in [place] and underwent TACE procedure 3 times in 2015. He was subsequently followed, and recently repeated CT scan showed a new 5.0 cm mass in segment 8, with portal vein invasion. He was felt not to be a candidate for further liver targeted therapy, and was referred to [place] for further systemic therapy.  He complains about mid epigastric pain for the past 2 years, which significantly improved after his TACE procedure in 2015. It has been getting worse lately in the past 6 months. He states he gets about 7-8 out of 10, persistent, he has been taking oxycodone 20 mg every 6 hours, but his pain is not well controlled. He is quite fatigued, is low appetite. He last 40 lbs in the pat 4 months. He is able to function at home, in trying to remain to be physically active.  His hepatitis C was successfully treated. He has history of hepatitis C, complicated with ascites and encephalopathy, but it has been well controlled with medical management.   CURRENT THERAPY: Nivolumab 240mg  every 2 weeks, started on 02/04/2015; on 04/30/16 switched to every 3 weeks due to fatigue; change from 240mg  to 480mg  every 4 weeks starting 09/03/2016  INTERIM HISTORY: Kevin Dillon returns for follow up. He says he has been feeling terrible lately. He says he has some back pain and requests some medication. He does not think oxycodone or Xanax helps at all now. He takes oxycodone two at a time when he takes them. He tries to take them only as needed. He says he feels anxious all of the time.   Per his wife, he does not sleep at night. He has been having problems with his dog lately which could be causing stress.  He  reports his MR was very difficult and he had to be stuck 5 times to get a good vein. He has been gaining some weight lately.     MEDICAL HISTORY:  Past Medical History:  Diagnosis Date  . Cancer ([place])    liver  . Cholelithiasis   . Chronic hepatitis C ([place])   . Chronic knee pain   . Chronic left shoulder pain   . CVA (cerebral infarction)   . Gout   . Hepatitis C    genotype 1b.  pt has been vaccinated against hep A and B.  . Hypertension   . Osteoarthritis   . Schatzki's ring   . Tubular adenoma of colon 12/2011    SURGICAL HISTORY: Past Surgical History:  Procedure Laterality Date  . AMPUTATION Left 09/17/2012   Procedure: SMALL FINGER EXTENSOR TENDON REPAIR; METACARPAL LEVEL AMPUTATION RING FINGER; PROXIMAL PHALANX LEVEL AMPUTATION LONG FINGER;  Surgeon: Schuyler Amor, MD;  Location: [place];  Service: Orthopedics;  Laterality: Left;  . Arm surgery     right/plate in arm  .  COLONOSCOPY WITH ESOPHAGOGASTRODUODENOSCOPY (EGD)  12/30/2011   RMR: Noncritical Schatzki's ring;  Hiatal hernia, Tubular ADENOMA removed from splenic flexure, otherwise normal colonoscopy  . LEG SURGERY     left  . WOUND EXPLORATION Left 09/17/2012   Procedure: WOUND EXPLORATION;  Surgeon: Schuyler Amor, MD;  Location: [facility];  Service: Orthopedics;  Laterality: Left;    SOCIAL HISTORY: Social History   Social History  . Marital status: Single    Spouse name: N/A  . Number of children: 0  . Years of education: N/A   Occupational History  . disabled    Social History Main Topics  . Smoking status: Former Smoker    Packs/day: 0.50    Years: 40.00    Types: Cigarettes    Quit date: 01/19/2012  . Smokeless tobacco: Never Used     Comment: Smokes 1/2 pack of cigarettes daily  . Alcohol use No     Comment: HX 2 beers 3-4 days per week; QUIT DEC 2013  . Drug use: No     Comment: Hx cocaine yrs ago, Last marijuana OCT 2013  . Sexual activity: Not Currently    Partners: Female    Other Topics Concern  . Not on file   Social History Narrative   Lives w/ significant other, [name]    FAMILY HISTORY: Family History  Problem Relation Age of Onset  . Cirrhosis Father 43  . Cancer Father        liver cancer   . Lung cancer Mother 46  . Colon cancer Neg Hx     ALLERGIES:  has No Known Allergies.  MEDICATIONS:  Current Outpatient Prescriptions  Medication Sig Dispense Refill  . furosemide (LASIX) 20 MG tablet Take 1 tablet by mouth daily.  3  . hydrALAZINE (APRESOLINE) 25 MG tablet Take 50 mg by mouth daily.     Marland Kitchen lactulose (CHRONULAC) 10 GM/15ML solution 30 g daily as needed for moderate constipation.     . megestrol (MEGACE) 40 MG/ML suspension Take 400 mg by mouth daily.     . Multiple Vitamins-Minerals (CENTRUM SILVER PO) Take 1 tablet by mouth daily.     Marland Kitchen oxycodone (ROXICODONE) 30 MG immediate release tablet Take 1 tablet (30 mg total) by mouth every 4 (four) hours as needed for pain. 120 tablet 0  . PARoxetine (PAXIL) 20 MG tablet Take 20 mg by mouth every morning.     . prochlorperazine (COMPAZINE) 10 MG tablet TAKE 1 TABLET BY MOUTH EVERY 8 HOURS AS NEEDED FOR NAUSEA/VOMITING 30 tablet 1  . rivaroxaban (XARELTO) 20 MG TABS tablet Take 1 tablet (20 mg total) by mouth daily with supper. 30 tablet 2  . ALPRAZolam (XANAX) 1 MG tablet Take 1 tablet (1 mg total) by mouth 3 (three) times daily as needed for anxiety. 90 tablet 0  . cyclobenzaprine (FLEXERIL) 5 MG tablet Take 1 tablet (5 mg total) by mouth 3 (three) times daily as needed for muscle spasms. 30 tablet 1  . oxyCODONE (OXYCONTIN) 80 mg 12 hr tablet Take 2 tablets (160 mg total) by mouth every 12 (twelve) hours. 120 tablet 0   No current facility-administered medications for this visit.     REVIEW OF SYSTEMS:  Constitutional: Denies fevers, chills or abnormal night sweats  (+)weight gain Eyes: Denies blurriness of vision, double vision or watery eyes Ears, nose, mouth, throat, and face:  Denies mucositis or sore throat Respiratory: Denies cough, dyspnea or wheezes Cardiovascular: Denies palpitation, chest discomfort or lower  extremity swelling Gastrointestinal:  Denies nausea, heartburn or change in bowel habits,  Skin: Denies abnormal skin rashes Lymphatics: Denies new lymphadenopathy or easy bruising Neurological:Denies numbness, tingling or new weaknesses Behavioral/Psych: normal Muscuoskeletal: (+) back pain  All other systems were reviewed with the patient and are negative.  PHYSICAL EXAMINATION  ECOG PERFORMANCE STATUS: 2  Vitals:   08/27/16 1433  BP: (!) 183/87  Pulse: 84  Resp: 20  Temp: 98.2 F (36.8 C)  SpO2: 99%   Filed Weights   08/27/16 1433  Weight: 201 lb 1.6 oz (91.2 kg)    GENERAL:alert,  SKIN: skin color, texture, turgor are normal, no rashes or significant lesions EYES: normal, conjunctiva are pink and non-injected, sclera clear OROPHARYNX:no exudate, no erythema and lips, buccal mucosa, and tongue normal  NECK: supple, thyroid normal size, non-tender, without nodularity LYMPH:  no palpable lymphadenopathy in the cervical, axillary or inguinal LUNGS: clear to auscultation and percussion with normal breathing effort HEART: regular rate & rhythm and no murmurs and no lower extremity edema ABDOMEN:(+) resolved abdominal bloating Musculoskeletal:no cyanosis of digits and no clubbing  PSYCH: alert & oriented x 3 with fluent speech NEURO: no focal motor/sensory deficits  LABORATORY DATA:  I have reviewed the data as listed CBC Latest Ref Rng & Units 08/27/2016 08/06/2016 07/16/2016  WBC 4.0 - 10.3 10e3/uL 9.7 7.5 8.8  Hemoglobin 13.0 - 17.1 g/dL 12.0(L) 12.0(L) 11.7(L)  Hematocrit 38.4 - 49.9 % 36.2(L) 34.6(L) 33.8(L)  Platelets 140 - 400 10e3/uL 153 129(L) 204   CMP Latest Ref Rng & Units 08/27/2016 08/06/2016 07/16/2016  Glucose 70 - 140 mg/dl 110 129 137  BUN 7.0 - 26.0 mg/dL 18.9 17.1 15.7  Creatinine 0.7 - 1.3 mg/dL 1.5(H) 1.4(H) 1.5(H)    Sodium 136 - 145 mEq/L 140 138 138  Potassium 3.5 - 5.1 mEq/L 4.1 3.7 3.8  Chloride 101 - 111 mmol/L - - -  CO2 22 - 29 mEq/L 25 22 21(L)  Calcium 8.4 - 10.4 mg/dL 9.5 9.4 9.2  Total Protein 6.4 - 8.3 g/dL 7.8 7.7 7.6  Total Bilirubin 0.20 - 1.20 mg/dL 0.55 0.51 0.39  Alkaline Phos 40 - 150 U/L 92 98 103  AST 5 - 34 U/L 29 23 25   ALT 0 - 55 U/L 23 13 14    AFP: 09/14/2012: 28.9 10/11/2015: 5.3 11/01/2015: 3.7 12/02/2015: 3.7 01/22/2016: 3.4 02/18/2016: 7.1 03/17/16: 5.8 04/16/16: 4.1 06/04/2016: 3.5 07/16/2016:4.4  RADIOGRAPHIC STUDIES: I have personally reviewed the radiological images as listed and agreed with the findings in the report.  04/17/16 CT CAP W PELVIS IMPRESSION: 1. Stable mild mediastinal lymphadenopathy. 2. Interval improvement in the bibasilar peribronchial thickening, airway impaction, and peribronchovascular nodularity. Imaging features suggest marked improvement in bilateral low the atypical infection. Changes in the right middle lobe with more stable and may represent scarring from previous infectious/inflammatory etiology. 3. The small hypervascular lesions seen in the anterior left liver on the previous study are not evident today. There is some subtle, ill-defined hyperenhancement in the central left liver which is nonspecific. Attention to this area on followup imaging recommended. 4. Chronic portal vein inclusion with cavernous transformation in the porta hepatis and paraesophageal varices. 5. Stable appearance hepatoduodenal and retroperitoneal lymphadenopathy, likely chronic and related to liver disease.   MR Abdomen W WO Contrast 05/21/16 IMPRESSION: 1. Limited motion degraded scan. Cirrhosis. No evidence of a liver mass within these limitations. 2. Mild splenomegaly. No ascites. Stable mild paraumbilical and gastroesophageal varices. 3. Stable nonspecific mild retroperitoneal lymphadenopathy. 4.  Stable chronic main portal vein occlusion with  cavernous transformation of the portal vein.  MR Abdomen w wo contrast 08/25/2016 IMPRESSION: 1. Mild-to-moderate motion degradation, preferentially involving the pre and postcontrast dynamic images. On follow-up, given the extent of motion on this exam, multiphase CT may be preferred. 2. Given these limitations, no evidence of hepatocellular carcinoma. 3. Marked cirrhosis and portal venous hypertension. 4. Slight increase in abdominal adenopathy, which is most likely reactive. Recommend attention on follow-up. 5. Cholelithiasis.   ASSESSMENT & PLAN: 66 y.o. Caucasian male with past medical history of successfully treated hepatitis C, liver cirrhosis, history of ascites and encephalopathy, recurrent HCC  1. Recurrent HCC, initially stage II -I previously reviewed his previous image and outside medical records extensively, confirmed key findings with patient and his family members -He initially had a multifocal disease, status post TACE 3 times in 2015 -he developed symptomatic local recurrence in 08/2015, with a 5cm new lesion in segment 8 with direct invasion into portal vein, and a portocaval lymph node. He is not a candidate for liver targeted therapy per his IR Dr. Delice Lesch -He was started on first line srafenib, tolerated poorly. His previous CT abdomen and pelvis on 01/25/2016 revealed extensive portal hypertension, and a probable cancer progression in the liver. -His treatment has changed to immunotherapy Nivolumab, he tolerated very well, and clinically he is doing much better, with less pain and better energy. Continue.  -I previously discussed his restaging CT scan which was done on 02/14/2016, it actually showed decreased liver cancer size compared to the scan a month ago. No other new metastasis. -restaging CT from 04/17/2016 showed no visible liver mass, but the imaging was very limited due to his liver cirrhosis  -I previously reviewed his recent abdominal MRI from 05/21/2016, which  showed no visible mass in the liver. He has had complete radiographic response. -I reviewed his restaging MRI from 08/25/2016, which showed no visible liver mass, but the images quality with significant compromised due to the motion.  -Nivo has previously been changed to every 3 weeks, per pt's request due to fatigue and other side effects -he has been more fatigued, and gained weight lately, and he request his treatment today to be postponed to next week -  His thyroid function is low at times. I previously referred him to endocrinologist, Dr. Loanne Drilling  -Due to his complaint tissue, I'll change his Nivolumab 02/22/1977 milligrams every 4 weeks, which is FDA approved -We'll do CT scan for restaging in the future -Due to his poor IV access, I recommended him to consider port placement. He declined  2. Abdominal pain/ back pain -Secondary to [facility] and portal vein thrombosis -overall better now with oxycodone and OxyContin, he is on very high dose, overall pain is controlled  -I previously recommended him to see pain management clinic, he declined. - patient does not think oxycodone or Xanax is helping with his pain. I suggested he see a pain specialist and he denied - I will prescribe flexeril in attempt to help with his pain. Due to his high tolerance, he can take up to 2 a day -The patient states his pain is not well controlled, I'll increase his OxyContin from 80 mg every 8 hours to 160 mg every 12 hours, and continue oxycodone  3. Portal vein thrombosis -continue xarelto  -We previously discussed the moderate to high risk of bleeding with Xarelto due to his liver cirrhosis and he knows to avoid injury and fall   4 Liver cirrhosis  secondary to hepatitis C and alcohol, history of encephalopathy and ascites -He will continue follow-up with [facility], his ascites has been well controlled with diuretics, but seems getting worse lately, I previously encourage him to follow up with [facility]  -He knows to use  laxative, especially lactulose for his constipation  5. Hep C, successfully treated -Continue follow-up with liver clinic NP [facility]  6. Anxiety and insomnia  -He has Xanax for anxiety. I previously refilled Xanax 1 mg. -continue Ativan 1 mg at bedtime as needed for insomnia, he knows not to take with Xanax together, he knows not to take during the day. He does not feel Ativan is working well for sleep, he may take xanax for insomnia   7. Fatigue and weight gain -He reports more fatigue and weak and lately -His TSH has been moderately elevated lately, we discussed the side effect of hypothyroidism from Nivolumab, I will refer him to Dr. Loanne Drilling for evaluation to see if he needs low-dose Synthroid  8. HTN -I encourage him to restart Hydralazine, and follow-up of his primary care physician  9. Smoking - Patient has started back smoking due to the fear of not getting better - I previously strongly recommended a smoking cessation program. He refused.  10. Arthralgia -Patient has developed significant arthralgia, possible related to Nivolumab. It has much improved this week. Exam was unremarkable. - Patient takes OxyContin to help and oxycodone prn - I advised he can take up to ibuprofen 3 a day - If pain worsens, I may try a short course of steroids  11. Depression - The patient reports feelings of depression related to his diagnosis. - He reports fears he is going to die soon. - I previously encouraged the patient that his recent scan shows an excellent response to treatment.  12. Goal of care discussion, DNR/DNI  -We previously discussed the incurable nature of his cancer, and the overall poor prognosis, especially if he does not have good response to chemotherapy or progress on chemo -The patient understands the goal of care is palliative. -he agrees with DNR/DNI    Plan  -refill Xanax to be filled on 8/20 - prescribe flexeril  he can take up to 2 - Cancel Nivolumab infusion  today, reschedule to next week, will change it to 480mg  every 4 weeks  -He will see his PCP Dr. Wynelle Link to discuss his HTN  - f/u in  5 weeks  All questions were answered. The patient knows to call the clinic with any problems, questions or concerns.  I spent 20 minutes counseling the patient face to face. The total time spent in the appointment was 30 minutes and more than 50% was on counseling.  This document serves as a record of services personally performed by Truitt Merle, MD. It was created on her behalf by Brandt Loosen, a trained medical scribe. The creation of this record is based on the scribe's personal observations and the provider's statements to them. This document has been checked and approved by the attending provider.   I have reviewed the above documentation for accuracy and completeness and I agree with the above.  Truitt Merle 08/27/2016

## 2016-08-27 NOTE — Telephone Encounter (Signed)
Gave patient avs and calendar.   °

## 2016-08-28 LAB — AFP TUMOR MARKER: AFP, SERUM, TUMOR MARKER: 5.6 ng/mL (ref 0.0–8.3)

## 2016-08-28 LAB — T4, FREE: T4,Free(Direct): 1.18 ng/dL (ref 0.82–1.77)

## 2016-08-28 LAB — T3, FREE: T3 FREE: 2.9 pg/mL (ref 2.0–4.4)

## 2016-09-01 MED FILL — oxyCODONE HCL 30 MG TABS: 30 | 20 days supply | Qty: 120 | Fill #0

## 2016-09-03 ENCOUNTER — Ambulatory Visit (HOSPITAL_BASED_OUTPATIENT_CLINIC_OR_DEPARTMENT_OTHER): Payer: Medicare Other

## 2016-09-03 ENCOUNTER — Other Ambulatory Visit (HOSPITAL_BASED_OUTPATIENT_CLINIC_OR_DEPARTMENT_OTHER): Payer: Medicare Other

## 2016-09-03 VITALS — BP 131/64 | HR 88 | Temp 98.0°F | Resp 18

## 2016-09-03 DIAGNOSIS — Z5112 Encounter for antineoplastic immunotherapy: Secondary | ICD-10-CM

## 2016-09-03 DIAGNOSIS — Z8619 Personal history of other infectious and parasitic diseases: Secondary | ICD-10-CM | POA: Diagnosis not present

## 2016-09-03 DIAGNOSIS — C220 Liver cell carcinoma: Secondary | ICD-10-CM

## 2016-09-03 LAB — CBC WITH DIFFERENTIAL/PLATELET
BASO%: 0.2 % (ref 0.0–2.0)
BASOS ABS: 0 10*3/uL (ref 0.0–0.1)
EOS%: 1.9 % (ref 0.0–7.0)
Eosinophils Absolute: 0.2 10*3/uL (ref 0.0–0.5)
HEMATOCRIT: 27.5 % — AB (ref 38.4–49.9)
HGB: 9.4 g/dL — ABNORMAL LOW (ref 13.0–17.1)
LYMPH%: 30.6 % (ref 14.0–49.0)
MCH: 30.3 pg (ref 27.2–33.4)
MCHC: 34.2 g/dL (ref 32.0–36.0)
MCV: 88.7 fL (ref 79.3–98.0)
MONO#: 0.8 10*3/uL (ref 0.1–0.9)
MONO%: 9 % (ref 0.0–14.0)
NEUT#: 5 10*3/uL (ref 1.5–6.5)
NEUT%: 58.3 % (ref 39.0–75.0)
Platelets: 158 10*3/uL (ref 140–400)
RBC: 3.1 10*6/uL — AB (ref 4.20–5.82)
RDW: 14.5 % (ref 11.0–14.6)
WBC: 8.5 10*3/uL (ref 4.0–10.3)
lymph#: 2.6 10*3/uL (ref 0.9–3.3)

## 2016-09-03 LAB — COMPREHENSIVE METABOLIC PANEL
ALBUMIN: 3.5 g/dL (ref 3.5–5.0)
ALK PHOS: 100 U/L (ref 40–150)
ALT: 17 U/L (ref 0–55)
ANION GAP: 10 meq/L (ref 3–11)
AST: 25 U/L (ref 5–34)
BILIRUBIN TOTAL: 0.49 mg/dL (ref 0.20–1.20)
BUN: 23.4 mg/dL (ref 7.0–26.0)
CALCIUM: 9.5 mg/dL (ref 8.4–10.4)
CO2: 22 mEq/L (ref 22–29)
Chloride: 105 mEq/L (ref 98–109)
Creatinine: 1.5 mg/dL — ABNORMAL HIGH (ref 0.7–1.3)
EGFR: 50 mL/min/{1.73_m2} — AB (ref >90)
Glucose: 137 mg/dl (ref 70–140)
POTASSIUM: 3.6 meq/L (ref 3.5–5.1)
SODIUM: 138 meq/L (ref 136–145)
TOTAL PROTEIN: 7.5 g/dL (ref 6.4–8.3)

## 2016-09-03 MED ORDER — SODIUM CHLORIDE 0.9 % IV SOLN
Freq: Once | INTRAVENOUS | Status: AC
  Administered 2016-09-03: 14:00:00 via INTRAVENOUS

## 2016-09-03 MED ORDER — NIVOLUMAB CHEMO INJECTION 100 MG/10ML
480.0000 mg | Freq: Once | INTRAVENOUS | Status: AC
  Administered 2016-09-03: 480 mg via INTRAVENOUS
  Filled 2016-09-03: qty 48

## 2016-09-03 NOTE — Patient Instructions (Signed)
[facility] Discharge Instructions for Patients Receiving Chemotherapy  Today you received the following chemotherapy agents Opdivo.  To help prevent nausea and vomiting after your treatment, we encourage you to take your nausea medication as directed.   If you develop nausea and vomiting that is not controlled by your nausea medication, call the clinic.   BELOW ARE SYMPTOMS THAT SHOULD BE REPORTED IMMEDIATELY:  *FEVER GREATER THAN 100.5 F  *CHILLS WITH OR WITHOUT FEVER  NAUSEA AND VOMITING THAT IS NOT CONTROLLED WITH YOUR NAUSEA MEDICATION  *UNUSUAL SHORTNESS OF BREATH  *UNUSUAL BRUISING OR BLEEDING  TENDERNESS IN MOUTH AND THROAT WITH OR WITHOUT PRESENCE OF ULCERS  *URINARY PROBLEMS  *BOWEL PROBLEMS  UNUSUAL RASH Items with * indicate a potential emergency and should be followed up as soon as possible.  Feel free to call the clinic you have any questions or concerns. The clinic phone number is [phone].  Please show the CHEMO ALERT CARD at check-in to the Emergency Department and triage nurse.    

## 2016-09-04 LAB — HEPATITIS A ANTIBODY, IGM: HEP A IGM: NEGATIVE

## 2016-09-11 MED FILL — XARELTO 20 MG TABLET: 20 | 30 days supply | Qty: 30 | Fill #2

## 2016-09-11 MED FILL — OxyCONTIN 80 MG T12A: 80 | 30 days supply | Qty: 120 | Fill #0

## 2016-09-11 MED FILL — PROCHLORPERAZINE 10 MG TAB: 10 | 10 days supply | Qty: 30 | Fill #0

## 2016-09-14 MED FILL — ALPRAZolam 1 MG TABS: 1 | 30 days supply | Qty: 90 | Fill #0

## 2016-09-14 MED FILL — CYCLOBENZAPRINE 5 MG TABLET: 5 | 10 days supply | Qty: 30 | Fill #1

## 2016-09-22 ENCOUNTER — Telehealth: Payer: Self-pay | Admitting: *Deleted

## 2016-09-22 DIAGNOSIS — C220 Liver cell carcinoma: Secondary | ICD-10-CM

## 2016-09-22 MED ORDER — OXYCODONE HCL 30 MG PO TABS: 30.0000 mg | ORAL_TABLET | ORAL | 0 refills | Status: DC | PRN

## 2016-09-22 MED FILL — FUROSEMIDE 20 MG TAB: 20 | 30 days supply | Qty: 30 | Fill #1

## 2016-09-22 MED FILL — oxyCODONE HCL 30 MG TABS: 30 | 20 days supply | Qty: 120 | Fill #0

## 2016-09-22 NOTE — Telephone Encounter (Signed)
Rec voicemail from [facility] requesting refill on Oxycodone IR.  Call back #[phone]

## 2016-09-28 NOTE — Progress Notes (Signed)
No show  This encounter was created in error - please disregard.

## 2016-10-01 ENCOUNTER — Ambulatory Visit: Payer: Medicare Other

## 2016-10-01 ENCOUNTER — Encounter: Payer: Medicare Other | Admitting: Hematology

## 2016-10-01 ENCOUNTER — Other Ambulatory Visit: Payer: Medicare Other

## 2016-10-05 DIAGNOSIS — Z23 Encounter for immunization: Secondary | ICD-10-CM | POA: Diagnosis not present

## 2016-10-07 ENCOUNTER — Telehealth: Payer: Self-pay | Admitting: *Deleted

## 2016-10-07 NOTE — Telephone Encounter (Signed)
Received vm call from [facility] asking about rescheduling appts for [facility].  Informed that he is r/s for tomorrow & times given.  She expressed appreciation.

## 2016-10-08 ENCOUNTER — Other Ambulatory Visit: Payer: Self-pay | Admitting: *Deleted

## 2016-10-08 ENCOUNTER — Telehealth: Payer: Self-pay | Admitting: Hematology

## 2016-10-08 ENCOUNTER — Telehealth: Payer: Self-pay

## 2016-10-08 ENCOUNTER — Ambulatory Visit (HOSPITAL_COMMUNITY)
Admission: RE | Admit: 2016-10-08 | Discharge: 2016-10-08 | Disposition: A | Discharge status: Home / Self Care | Payer: Medicare Other | Source: Ambulatory Visit | Attending: Hematology | Admitting: Hematology

## 2016-10-08 ENCOUNTER — Ambulatory Visit (HOSPITAL_BASED_OUTPATIENT_CLINIC_OR_DEPARTMENT_OTHER): Payer: Medicare Other

## 2016-10-08 ENCOUNTER — Encounter: Payer: Self-pay | Admitting: Hematology

## 2016-10-08 ENCOUNTER — Other Ambulatory Visit (HOSPITAL_BASED_OUTPATIENT_CLINIC_OR_DEPARTMENT_OTHER): Payer: Medicare Other

## 2016-10-08 ENCOUNTER — Ambulatory Visit (HOSPITAL_BASED_OUTPATIENT_CLINIC_OR_DEPARTMENT_OTHER): Payer: Medicare Other | Admitting: Hematology

## 2016-10-08 DIAGNOSIS — R530 Neoplastic (malignant) related fatigue: Secondary | ICD-10-CM

## 2016-10-08 DIAGNOSIS — C220 Liver cell carcinoma: Secondary | ICD-10-CM

## 2016-10-08 DIAGNOSIS — Z7901 Long term (current) use of anticoagulants: Secondary | ICD-10-CM | POA: Diagnosis not present

## 2016-10-08 DIAGNOSIS — F329 Major depressive disorder, single episode, unspecified: Secondary | ICD-10-CM

## 2016-10-08 DIAGNOSIS — R109 Unspecified abdominal pain: Secondary | ICD-10-CM

## 2016-10-08 DIAGNOSIS — D62 Acute posthemorrhagic anemia: Secondary | ICD-10-CM

## 2016-10-08 DIAGNOSIS — T451X5A Adverse effect of antineoplastic and immunosuppressive drugs, initial encounter: Secondary | ICD-10-CM | POA: Insufficient documentation

## 2016-10-08 DIAGNOSIS — D649 Anemia, unspecified: Secondary | ICD-10-CM | POA: Diagnosis not present

## 2016-10-08 DIAGNOSIS — D6481 Anemia due to antineoplastic chemotherapy: Secondary | ICD-10-CM

## 2016-10-08 DIAGNOSIS — M545 Low back pain: Secondary | ICD-10-CM | POA: Diagnosis not present

## 2016-10-08 DIAGNOSIS — Z5112 Encounter for antineoplastic immunotherapy: Secondary | ICD-10-CM

## 2016-10-08 DIAGNOSIS — R635 Abnormal weight gain: Secondary | ICD-10-CM | POA: Diagnosis not present

## 2016-10-08 DIAGNOSIS — Z720 Tobacco use: Secondary | ICD-10-CM

## 2016-10-08 DIAGNOSIS — Z86718 Personal history of other venous thrombosis and embolism: Secondary | ICD-10-CM

## 2016-10-08 DIAGNOSIS — G4700 Insomnia, unspecified: Secondary | ICD-10-CM | POA: Diagnosis not present

## 2016-10-08 DIAGNOSIS — F419 Anxiety disorder, unspecified: Secondary | ICD-10-CM

## 2016-10-08 LAB — CBC WITH DIFFERENTIAL/PLATELET
BASO%: 0.5 % (ref 0.0–2.0)
Basophils Absolute: 0 10*3/uL (ref 0.0–0.1)
EOS%: 3.2 % (ref 0.0–7.0)
Eosinophils Absolute: 0.3 10*3/uL (ref 0.0–0.5)
HCT: 22.7 % — ABNORMAL LOW (ref 38.4–49.9)
HEMOGLOBIN: 7.3 g/dL — AB (ref 13.0–17.1)
LYMPH%: 28.2 % (ref 14.0–49.0)
MCH: 24.1 pg — AB (ref 27.2–33.4)
MCHC: 32 g/dL (ref 32.0–36.0)
MCV: 75.3 fL — ABNORMAL LOW (ref 79.3–98.0)
MONO#: 0.8 10*3/uL (ref 0.1–0.9)
MONO%: 10.2 % (ref 0.0–14.0)
NEUT#: 4.5 10*3/uL (ref 1.5–6.5)
NEUT%: 57.9 % (ref 39.0–75.0)
Platelets: 263 10*3/uL (ref 140–400)
RBC: 3.02 10*6/uL — AB (ref 4.20–5.82)
RDW: 18.6 % — AB (ref 11.0–14.6)
WBC: 7.8 10*3/uL (ref 4.0–10.3)
lymph#: 2.2 10*3/uL (ref 0.9–3.3)

## 2016-10-08 LAB — CBC & DIFF AND RETIC
BASO%: 0.6 % (ref 0.0–2.0)
BASOS ABS: 0 10*3/uL (ref 0.0–0.1)
EOS%: 2.7 % (ref 0.0–7.0)
Eosinophils Absolute: 0.2 10*3/uL (ref 0.0–0.5)
HCT: 22.7 % — ABNORMAL LOW (ref 38.4–49.9)
HEMOGLOBIN: 6.9 g/dL — AB (ref 13.0–17.1)
IMMATURE RETIC FRACT: 18.1 % — AB (ref 3.00–10.60)
LYMPH%: 29.2 % (ref 14.0–49.0)
MCH: 23.8 pg — AB (ref 27.2–33.4)
MCHC: 30.4 g/dL — ABNORMAL LOW (ref 32.0–36.0)
MCV: 78.3 fL — AB (ref 79.3–98.0)
MONO#: 0.4 10*3/uL (ref 0.1–0.9)
MONO%: 6.6 % (ref 0.0–14.0)
NEUT%: 60.9 % (ref 39.0–75.0)
NEUTROS ABS: 4.1 10*3/uL (ref 1.5–6.5)
NRBC: 0 % (ref 0–0)
PLATELETS: 213 10*3/uL (ref 140–400)
RBC: 2.9 10*6/uL — ABNORMAL LOW (ref 4.20–5.82)
RDW: 15.9 % — AB (ref 11.0–14.6)
Retic %: 2.08 % — ABNORMAL HIGH (ref 0.80–1.80)
Retic Ct Abs: 60.32 10*3/uL (ref 34.80–93.90)
WBC: 6.7 10*3/uL (ref 4.0–10.3)
lymph#: 2 10*3/uL (ref 0.9–3.3)

## 2016-10-08 LAB — COMPREHENSIVE METABOLIC PANEL
ALBUMIN: 3.7 g/dL (ref 3.5–5.0)
ALK PHOS: 106 U/L (ref 40–150)
ALT: 9 U/L (ref 0–55)
AST: 24 U/L (ref 5–34)
Anion Gap: 7 mEq/L (ref 3–11)
BUN: 10 mg/dL (ref 7.0–26.0)
CHLORIDE: 106 meq/L (ref 98–109)
CO2: 24 meq/L (ref 22–29)
Calcium: 9.2 mg/dL (ref 8.4–10.4)
Creatinine: 1.5 mg/dL — ABNORMAL HIGH (ref 0.7–1.3)
EGFR: 50 mL/min/{1.73_m2} — AB (ref >90)
GLUCOSE: 87 mg/dL (ref 70–140)
POTASSIUM: 4.1 meq/L (ref 3.5–5.1)
SODIUM: 137 meq/L (ref 136–145)
Total Bilirubin: 0.36 mg/dL (ref 0.20–1.20)
Total Protein: 7.5 g/dL (ref 6.4–8.3)

## 2016-10-08 LAB — TSH: TSH: 5.111 m(IU)/L — ABNORMAL HIGH (ref 0.320–4.118)

## 2016-10-08 LAB — LACTATE DEHYDROGENASE: LDH: 269 U/L — AB (ref 125–245)

## 2016-10-08 MED ORDER — RIVAROXABAN 20 MG PO TABS: 20.0000 mg | ORAL_TABLET | Freq: Every day | ORAL | 5 refills | Status: DC

## 2016-10-08 MED ORDER — ALPRAZOLAM 1 MG PO TABS: 1.0000 mg | ORAL_TABLET | Freq: Three times a day (TID) | ORAL | 0 refills | Status: DC | PRN

## 2016-10-08 MED ORDER — OXYCODONE HCL 30 MG PO TABS: 30.0000 mg | ORAL_TABLET | ORAL | 0 refills | Status: DC | PRN

## 2016-10-08 MED ORDER — OXYCODONE HCL ER 80 MG PO T12A: 160.0000 mg | EXTENDED_RELEASE_TABLET | Freq: Two times a day (BID) | ORAL | 0 refills | Status: DC

## 2016-10-08 MED ORDER — SODIUM CHLORIDE 0.9 % IV SOLN
480.0000 mg | Freq: Once | INTRAVENOUS | Status: AC
  Administered 2016-10-08: 480 mg via INTRAVENOUS
  Filled 2016-10-08: qty 48

## 2016-10-08 MED ORDER — SODIUM CHLORIDE 0.9 % IV SOLN
Freq: Once | INTRAVENOUS | Status: AC
  Administered 2016-10-08: 15:00:00 via INTRAVENOUS

## 2016-10-08 NOTE — Telephone Encounter (Signed)
Communication noted.  

## 2016-10-08 NOTE — Telephone Encounter (Signed)
Received a phone call today from oncology- pts hgb is 6.9. They have scheduled a blood transfusion for him at 9am tomorrow. Dr.Feng would like for the pt to be seen to set up a TCS/EGD asap. I have scheduled him with [facility] on Tuesday 10/13/16. Pt is aware of date and time.

## 2016-10-08 NOTE — Telephone Encounter (Signed)
Gave avs and calendar for October  °

## 2016-10-08 NOTE — Progress Notes (Signed)
[place]  [phone] [phone]  Clinic Follow up Note   Patient Care Team: Antonietta Jewel, MD as PCP - General (Internal Medicine) [name], Cristopher Estimable, MD as Attending Physician (Gastroenterology) Antonietta Jewel, MD as Referring Physician (Internal Medicine) Truitt Merle, MD as Consulting Physician (Hematology) [name], CRNP as Nurse Practitioner (Nurse Practitioner) 10/08/2016   CHIEF COMPLAINTS:  Follow up recurrent [place]   Oncology History   Hepatocellular carcinoma [facility])   Staging form: Liver (Excluding Intrahepatic Bile Ducts), AJCC 7th Edition   - Clinical stage from 04/16/2012: Stage II (T2(m), N0, M0) - Signed by Truitt Merle, MD on 10/12/2015   - Pathologic stage from 04/16/2013: Stage II (T2, N0, cM0) - Signed by Truitt Merle, MD on 10/12/2015      Hepatocellular carcinoma ([place])   05/1927 Imaging         09/14/2012 Tumor Marker    AFP 28.9      04/16/2013 Imaging    Abdominal MRI with and without contrast reviewed multifocal (4) liver lesions in both left and right lobes, most consistent with HCC, largest measuring 2.7 cm      04/16/2013 Initial Diagnosis    Hepatocellular carcinoma ([place])      04/28/2013 Procedure    Right TACE with lipiodol       06/15/2013 Procedure    Left TACE with [facility]       07/14/2013 Procedure    Right TACE with [facility]      09/05/2015 Imaging    CT abdomen with and without contrast showed a new 5.0 x 2.3 cm mass in the hepatic segment 8, invading and occluding the right anterior portal vein, most compatible with [place]. A portacaval node measuring 3.0 x 1.4 cm, previously 2.9 x 1.1 cm.      10/11/2015 Tumor Marker    AFP 5.3      11/11/2015 - 02/03/2016 Chemotherapy    sorafenib 200mg  bid started on 11/11/2015, increased to 400mg  bid in 2 weeks, stopped due to poor tolerance and probable disease progression       11/14/2015 Imaging    CT CAP 11/14/2015 IMPRESSION: Chest Impression: 1. RIGHT upper lobe pulmonary  nodule is indeterminate. No comparison available. 2. Ground-glass opacity and peripheral nodules in the RIGHT middle lobe appear post infectious or inflammatory.  Abdomen / Pelvis Impression: 1. 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 Thrombus also likely within the LEFT and RIGHT portal vein. Difficult to distinguish tumor thrombus versus bland thrombus. 2. Individual lesions are difficult to define in the RIGHT hepatic lobe. There is overall impression of progression of disease in the RIGHT hepatic lobe with multiple ill-defined enhancing lesions. 3. Periportal and shotty retroperitoneal adenopathy similar to prior.      01/25/2016 Imaging    CT Abdomen Pelvis w/ Contrast IMPRESSION: 1. Findings consistent with multifocal liver carcinoma with extensive portal vein thrombosis, and subsequent development of porta hepatis venous collaterals. Thrombus in the central portal vein is no longer expansile, but still expands the more peripheral portal vein and the right and left portal veins. There are few right liver bile ducts there are now dilated, which suggests mild progression of liver disease. 2. There is mild splenomegaly consistent or venous hypertension, which has mildly increased from the prior CT. 3. There are new lung abnormalities with interstitial thickening and ill-defined peribronchovascular nodular opacities mostly at the right lung base with a  smaller area noted along the medial left lower lobe. The etiology may be infectious. It may be inflammatory, possibly related to chemotherapy. Neoplastic disease is also possible. 4. There is no other evidence of metastatic disease within the abdomen or pelvis. No acute findings within the abdomen or pelvis      01/25/2016 - 01/25/2016 Hospital Admission    Pt was seen in ED for worsening abdominal pain, treated with pain meds        02/04/2016 -  Chemotherapy    The patient started on immunotherapy treatment nivolumab every 2 weeks; on 04/30/16 dose was reduced to 420mg  every 3 weeks       02/14/2016 Imaging    CT CAP w/ contrast 1. Slight interval increase in size of the mediastinal lymph nodes. 2. Persistent extensive lower lobe peribronchial thickening and patchy infiltrates. Possible aspiration pneumonia. 3. Persistent tree-in-bud appearance in the right middle lobe, likely chronic inflammation or atypical infection. 4. Improved CT appearance of the liver. Two small residual arterial phase enhancing lesions are identified. 5. Chronic portal vein thrombosis with cavernous transformation of portal venous collaterals. Esophageal varices are again noted. 6. Stable upper abdominal lymph nodes likely related to cirrhosis.       04/16/2016 Tumor Marker    AFP 4.1       04/17/2016 Imaging     CT CAP W PELVIS IMPRESSION: 1. Stable mild mediastinal lymphadenopathy. 2. Interval improvement in the bibasilar peribronchial thickening, airway impaction, and peribronchovascular nodularity. Imaging features suggest marked improvement in bilateral low the atypical infection. Changes in the right middle lobe with more stable and may represent scarring from previous infectious/inflammatory etiology. 3. The small hypervascular lesions seen in the anterior left liver on the previous study are not evident today. There is some subtle, ill-defined hyperenhancement in the central left liver which is nonspecific. Attention to this area on followup imaging recommended. 4. Chronic portal vein inclusion with cavernous transformation in the porta hepatis and paraesophageal varices. 5. Stable appearance hepatoduodenal and retroperitoneal lymphadenopathy, likely chronic and related to liver disease.      04/17/2016 Imaging    CT CAP CONTRAST IMPRESSION: 1. Stable mild mediastinal lymphadenopathy. 2. Interval improvement in the bibasilar  peribronchial thickening, airway impaction, and peribronchovascular nodularity. Imaging features suggest marked improvement in bilateral low the atypical infection. Changes in the right middle lobe with more stable and may represent scarring from previous infectious/inflammatory etiology. 3. The small hypervascular lesions seen in the anterior left liver on the previous study are not evident today. There is some subtle, ill-defined hyperenhancement in the central left liver which is nonspecific. Attention to this area on followup imaging recommended. 4. Chronic portal vein inclusion with cavernous transformation in the porta hepatis and paraesophageal varices. 5. Stable appearance hepatoduodenal and retroperitoneal lymphadenopathy, likely chronic and related to liver disease.      05/21/2016 Imaging    MR Abdomen W WO Contrast IMPRESSION: 1. Limited motion degraded scan. Cirrhosis. No evidence of a liver mass within these limitations. 2. Mild splenomegaly. No ascites. Stable mild paraumbilical and gastroesophageal varices. 3. Stable nonspecific mild retroperitoneal lymphadenopathy. 4. 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      06/04/2016 Tumor Marker    AFP 3.5       08/25/2016 Imaging    MR Abdomen w wo contrast  IMPRESSION: 1. Mild-to-moderate motion degradation, preferentially involving the pre and postcontrast dynamic images. On follow-up, given the extent of motion on this exam, multiphase  CT may be preferred. 2. Given these limitations, no evidence of hepatocellular carcinoma. 3. Marked cirrhosis and portal venous hypertension. 4. Slight increase in abdominal adenopathy, which is most likely reactive. Recommend attention on follow-up. 5. Cholelithiasis.        HISTORY OF PRESENTING ILLNESS (10/11/2015):  [name] 66 y.o. male with past medical history of treated hepatitis C, liver cirrhosis, and hepatocellular  carcinoma is here because of recurrent hepatocellular carcinoma. He is accompanied by his significant other [name] and her sister.  He was diagnosed with hepatocellular carcinoma in 2015, his initial image result is not available and staging is unknown. He was seen by interventional radiologist Dr. Delice Lesch at [facility] in [place] and underwent TACE procedure 3 times in 2015. He was subsequently followed, and recently repeated CT scan showed a new 5.0 cm mass in segment 8, with portal vein invasion. He was felt not to be a candidate for further liver targeted therapy, and was referred to [place] for further systemic therapy.  He complains about mid epigastric pain for the past 2 years, which significantly improved after his TACE procedure in 2015. It has been getting worse lately in the past 6 months. He states he gets about 7-8 out of 10, persistent, he has been taking oxycodone 20 mg every 6 hours, but his pain is not well controlled. He is quite fatigued, is low appetite. He last 40 lbs in the pat 4 months. He is able to function at home, in trying to remain to be physically active.  His hepatitis C was successfully treated. He has history of hepatitis C, complicated with ascites and encephalopathy, but it has been well controlled with medical management.   CURRENT THERAPY: Nivolumab 240mg  every 2 weeks, started on 02/04/2015; on 04/30/16 switched to every 3 weeks due to fatigue; change from 240mg  to 480mg  every 4 weeks starting 09/03/2016  INTERIM HISTORY: [place] returns for follow up. He forgot to take his BP medication today and it led to it being high in office. He missed his infusion last week due to overlooking the date. He has been doing better since our last visit. He reports taking oxycontin is helping his back pain. He has been taking 6 daily. He denies any bleeding. He feel some bloating lately. He saw NP [name] about a month ago who gave him some fluid pills.   MEDICAL HISTORY:  Past  Medical History:  Diagnosis Date  . Cancer ([place])    liver  . Cholelithiasis   . Chronic hepatitis C ([place])   . Chronic knee pain   . Chronic left shoulder pain   . CVA (cerebral infarction)   . Gout   . Hepatitis C    genotype 1b.  pt has been vaccinated against hep A and B.  . Hypertension   . Osteoarthritis   . Schatzki's ring   . Tubular adenoma of colon 12/2011    SURGICAL HISTORY: Past Surgical History:  Procedure Laterality Date  . AMPUTATION Left 09/17/2012   Procedure: SMALL FINGER EXTENSOR TENDON REPAIR; METACARPAL LEVEL AMPUTATION RING FINGER; PROXIMAL PHALANX LEVEL AMPUTATION LONG FINGER;  Surgeon: Schuyler Amor, MD;  Location: [facility];  Service: Orthopedics;  Laterality: Left;  . Arm surgery     right/plate in arm  . COLONOSCOPY WITH ESOPHAGOGASTRODUODENOSCOPY (EGD)  12/30/2011   RMR: Noncritical Schatzki's ring;  Hiatal hernia, Tubular ADENOMA removed from splenic flexure, otherwise normal colonoscopy  . LEG SURGERY     left  .  WOUND EXPLORATION Left 09/17/2012   Procedure: WOUND EXPLORATION;  Surgeon: Schuyler Amor, MD;  Location: [facility];  Service: Orthopedics;  Laterality: Left;    SOCIAL HISTORY: Social History   Social History  . Marital status: Single    Spouse name: N/A  . Number of children: 0  . Years of education: N/A   Occupational History  . disabled    Social History Main Topics  . Smoking status: Former Smoker    Packs/day: 0.50    Years: 40.00    Types: Cigarettes    Quit date: 01/19/2012  . Smokeless tobacco: Never Used     Comment: Smokes 1/2 pack of cigarettes daily  . Alcohol use No     Comment: HX 2 beers 3-4 days per week; QUIT DEC 2013  . Drug use: No     Comment: Hx cocaine yrs ago, Last marijuana OCT 2013  . Sexual activity: Not Currently    Partners: Female   Other Topics Concern  . Not on file   Social History Narrative   Lives w/ significant other, [name]    FAMILY HISTORY: Family History  Problem  Relation Age of Onset  . Cirrhosis Father 66  . Cancer Father        liver cancer   . Lung cancer Mother 1  . Colon cancer Neg Hx     ALLERGIES:  has No Known Allergies.  MEDICATIONS:  Current Outpatient Prescriptions  Medication Sig Dispense Refill  . ALPRAZolam (XANAX) 1 MG tablet Take 1 tablet (1 mg total) by mouth 3 (three) times daily as needed for anxiety. 90 tablet 0  . cyclobenzaprine (FLEXERIL) 5 MG tablet Take 1 tablet (5 mg total) by mouth 3 (three) times daily as needed for muscle spasms. 30 tablet 1  . furosemide (LASIX) 20 MG tablet Take 1 tablet by mouth daily.  3  . hydrALAZINE (APRESOLINE) 25 MG tablet Take 50 mg by mouth daily.     Marland Kitchen lactulose (CHRONULAC) 10 GM/15ML solution 30 g daily as needed for moderate constipation.     . megestrol (MEGACE) 40 MG/ML suspension Take 400 mg by mouth daily.     . Multiple Vitamins-Minerals (CENTRUM SILVER PO) Take 1 tablet by mouth daily.     Marland Kitchen oxyCODONE (OXYCONTIN) 80 mg 12 hr tablet Take 2 tablets (160 mg total) by mouth every 12 (twelve) hours. 120 tablet 0  . oxycodone (ROXICODONE) 30 MG immediate release tablet Take 1 tablet (30 mg total) by mouth every 4 (four) hours as needed for pain. 120 tablet 0  . PARoxetine (PAXIL) 20 MG tablet Take 20 mg by mouth every morning.     . prochlorperazine (COMPAZINE) 10 MG tablet TAKE 1 TABLET BY MOUTH EVERY 8 HOURS AS NEEDED FOR NAUSEA/VOMITING 30 tablet 1  . rivaroxaban (XARELTO) 20 MG TABS tablet Take 1 tablet (20 mg total) by mouth daily with supper. 30 tablet 5   No current facility-administered medications for this visit.     REVIEW OF SYSTEMS:  Constitutional: Denies fevers, chills or abnormal night sweats  (+)weight gain, bloated  Eyes: Denies blurriness of vision, double vision or watery eyes Ears, nose, mouth, throat, and face: Denies mucositis or sore throat Respiratory: Denies cough, dyspnea or wheezes Cardiovascular: Denies palpitation, chest discomfort or lower extremity  swelling Gastrointestinal:  Denies nausea, heartburn or change in bowel habits,  Skin: Denies abnormal skin rashes Lymphatics: Denies new lymphadenopathy or easy bruising Neurological:Denies numbness, tingling or new weaknesses Behavioral/Psych: normal  Muscuoskeletal: (+) back pain  All other systems were reviewed with the patient and are negative.  PHYSICAL EXAMINATION  ECOG PERFORMANCE STATUS: 2  Vitals:   10/08/16 1328 10/08/16 1357  BP: (!) 169/74 111/69  Pulse: 96 (!) 107  Resp: (!) 24 (!) 24  Temp: 98.7 F (37.1 C) 98.1 F (36.7 C)  SpO2: 97% 97%   Filed Weights   10/08/16 1324 10/08/16 1328 10/08/16 1357  Weight: 209 lb 14.4 oz (95.2 kg) 209 lb 14.4 oz (95.2 kg) 217 lb 11.2 oz (98.7 kg)    GENERAL:alert,  SKIN: skin color, texture, turgor are normal, no rashes or significant lesions EYES: normal, conjunctiva are pink and non-injected, sclera clear OROPHARYNX:no exudate, no erythema and lips, buccal mucosa, and tongue normal  NECK: supple, thyroid normal size, non-tender, without nodularity LYMPH:  no palpable lymphadenopathy in the cervical, axillary or inguinal LUNGS: clear to auscultation and percussion with normal breathing effort HEART: regular rate & rhythm and no murmurs and no lower extremity edema ABDOMEN:(+) abdominal bloating, no tenderness Musculoskeletal:no cyanosis of digits and no clubbing  PSYCH: alert & oriented x 3 with fluent speech NEURO: no focal motor/sensory deficits  LABORATORY DATA:  I have reviewed the data as listed CBC Latest Ref Rng & Units 10/08/2016 10/08/2016 09/03/2016  WBC 4.0 - 10.3 10e3/uL 6.7 7.8 8.5  Hemoglobin 13.0 - 17.1 g/dL 6.9(LL) 7.3(L) 9.4(L)  Hematocrit 38.4 - 49.9 % 22.7(L) 22.7(L) 27.5(L)  Platelets 140 - 400 10e3/uL 213 263 158   CMP Latest Ref Rng & Units 10/08/2016 09/03/2016 08/27/2016  Glucose 70 - 140 mg/dl 87 137 110  BUN 7.0 - 26.0 mg/dL 10.0 23.4 18.9  Creatinine 0.7 - 1.3 mg/dL 1.5(H) 1.5(H) 1.5(H)  Sodium  136 - 145 mEq/L 137 138 140  Potassium 3.5 - 5.1 mEq/L 4.1 3.6 4.1  Chloride 101 - 111 mmol/L - - -  CO2 22 - 29 mEq/L 24 22 25   Calcium 8.4 - 10.4 mg/dL 9.2 9.5 9.5  Total Protein 6.4 - 8.3 g/dL 7.5 7.5 7.8  Total Bilirubin 0.20 - 1.20 mg/dL 0.36 0.49 0.55  Alkaline Phos 40 - 150 U/L 106 100 92  AST 5 - 34 U/L 24 25 29   ALT 0 - 55 U/L 9 17 23    AFP: 09/14/2012: 28.9 10/11/2015: 5.3 11/01/2015: 3.7 12/02/2015: 3.7 01/22/2016: 3.4 02/18/2016: 7.1 03/17/16: 5.8 04/16/16: 4.1 06/04/2016: 3.5 07/16/2016:4.4 08/27/2016: 5.6  RADIOGRAPHIC STUDIES: I have personally reviewed the radiological images as listed and agreed with the findings in the report.  04/17/16 CT CAP W PELVIS IMPRESSION: 1. Stable mild mediastinal lymphadenopathy. 2. Interval improvement in the bibasilar peribronchial thickening, airway impaction, and peribronchovascular nodularity. Imaging features suggest marked improvement in bilateral low the atypical infection. Changes in the right middle lobe with more stable and may represent scarring from previous infectious/inflammatory etiology. 3. The small hypervascular lesions seen in the anterior left liver on the previous study are not evident today. There is some subtle, ill-defined hyperenhancement in the central left liver which is nonspecific. Attention to this area on followup imaging recommended. 4. Chronic portal vein inclusion with cavernous transformation in the porta hepatis and paraesophageal varices. 5. Stable appearance hepatoduodenal and retroperitoneal lymphadenopathy, likely chronic and related to liver disease.   MR Abdomen W WO Contrast 05/21/16 IMPRESSION: 1. Limited motion degraded scan. Cirrhosis. No evidence of a liver mass within these limitations. 2. Mild splenomegaly. No ascites. Stable mild paraumbilical and gastroesophageal varices. 3. Stable nonspecific mild retroperitoneal lymphadenopathy. 4. Stable chronic  main portal vein occlusion with  cavernous transformation of the portal vein.  MR Abdomen w wo contrast 08/25/2016 IMPRESSION: 1. Mild-to-moderate motion degradation, preferentially involving the pre and postcontrast dynamic images. On follow-up, given the extent of motion on this exam, multiphase CT may be preferred. 2. Given these limitations, no evidence of hepatocellular carcinoma. 3. Marked cirrhosis and portal venous hypertension. 4. Slight increase in abdominal adenopathy, which is most likely reactive. Recommend attention on follow-up. 5. Cholelithiasis.   ASSESSMENT & PLAN: 66 y.o. Caucasian male with past medical history of successfully treated hepatitis C, liver cirrhosis, history of ascites and encephalopathy, recurrent HCC  1. Recurrent HCC, initially stage II -I previously reviewed his previous image and outside medical records extensively, confirmed key findings with patient and his family members -He initially had a multifocal disease, status post TACE 3 times in 2015 -he developed symptomatic local recurrence in 08/2015, with a 5cm new lesion in segment 8 with direct invasion into portal vein, and a portocaval lymph node. He is not a candidate for liver targeted therapy per his IR Dr. Delice Lesch -He was started on first line srafenib, tolerated poorly. His previous CT abdomen and pelvis on 01/25/2016 revealed extensive portal hypertension, and a probable cancer progression in the liver. -His treatment has changed to immunotherapy Nivolumab, he tolerated very well, and clinically he is doing much better, with less pain and better energy. Continue.  -I previously discussed his restaging CT scan which was done on 02/14/2016, it actually showed decreased liver cancer size compared to the scan a month ago. No other new metastasis. -restaging CT from 04/17/2016 showed no visible liver mass, but the imaging was very limited due to his liver cirrhosis  -I previously reviewed his recent abdominal MRI from 05/21/2016, which  showed no visible mass in the liver. He has had complete radiographic response. -I reviewed his restaging MRI from 08/25/2016, which showed no visible liver mass, but the images quality with significant compromised due to the motion.  -Nivo has previously been changed to 480mg  every 4 weeks, he is tolerating well  -  His thyroid function is low at times. I previously referred him to endocrinologist, Dr. Loanne Drilling  -Burnis Medin do CT scan for restaging in Nov -Due to his poor IV access, I previously recommended him to consider port placement. He declined - Labs reviewed. Hgb 7.3 today I will recheck CBC in infusion room. If counts are consistent, he receive a blood transfusion today or tomorrow  2. Abdominal pain/ back pain -Secondary to [facility] and portal vein thrombosis -overall better now with oxycodone and OxyContin, he is on very high dose, overall pain is controlled  -I previously recommended him to see pain management clinic, he declined. - I previously prescribed flexeril in attempt to help with his pain. Due to his high tolerance, he can take up to 2 a day -The patient states his pain is not well controlled, I have increased his OxyContin from 80 mg every 8 hours to 160 mg every 12 hours, pain is better controlled now  - he is taking 6 Oxycodone tablets daily. I encouraged him to cut back to 5 daily instead, he will try.  3. Portal vein thrombosis -continue xarelto  -We previously discussed the moderate to high risk of bleeding with Xarelto due to his liver cirrhosis and he knows to avoid injury and fall   4 Liver cirrhosis secondary to hepatitis C and alcohol, history of encephalopathy and ascites -He will continue follow-up with Dawn, his  ascites has been well controlled with diuretics, but seems getting worse lately, I previously encourage him to follow up with Dawn  -He knows to use laxative, especially lactulose for his constipation  5. Hep C, successfully treated -Continue follow-up with  liver clinic NP Dawn  6. Anxiety and insomnia  -He has Xanax for anxiety. I previously refilled Xanax 1 mg. -continue Ativan 1 mg at bedtime as needed for insomnia, he knows not to take with Xanax together, he knows not to take during the day. He does not feel Ativan is working well for sleep, he may take xanax for insomnia   7. Fatigue and weight gain -He reports more fatigue and weak and lately -His TSH has been moderately elevated lately, we discussed the side effect of hypothyroidism from Nivolumab, I previously referred him to Dr. Loanne Drilling for evaluation to see if he needs low-dose Synthroid  8. HTN -I encourage him to restart Hydralazine, and follow-up of his primary care physician  9. Smoking - Patient has started back smoking due to the fear of not getting better - I previously strongly recommended a smoking cessation program. He refused.  10. Arthralgia -Patient has developed significant arthralgia, possible related to Nivolumab. It has much improved this week. Exam was unremarkable. - Patient takes OxyContin to help and oxycodone prn - I advised he can take up to ibuprofen 3 a day - If pain worsens, I may try a short course of steroids  11. Depression - The patient reports feelings of depression related to his diagnosis. - He reports fears he is going to die soon. - I previously encouraged the patient that his recent scan shows an excellent response to treatment.  12. Worsening anemia -Her hemoglobin dropped to 7.3 today, from 9.4 5 weeks ago  -She is on Xarelto, no clinical overt bleeding, I'll check her iron study, haptoglobin, LDH, to ruled out hemostasis also. -If repeated CBC shows hemoglobin less than 7.5, I'll consider blood transfusion in the next few days -I encouraged him to follow-up with his GI and repeat endoscopy to ruled out GI bleeding.  12. Goal of care discussion, DNR/DNI  -We previously discussed the incurable nature of his cancer, and the overall poor  prognosis, especially if he does not have good response to chemotherapy or progress on chemo -The patient understands the goal of care is palliative. -he agrees with DNR/DNI    Plan  - refill Oxycontin, oxycodone and Xanax - repeat labs - Continue with Nivolumab infusion today, - f/u in 4 weeks with lab and  -possible blood transfusion    All questions were answered. The patient knows to call the clinic with any problems, questions or concerns.  I spent 20 minutes counseling the patient face to face. The total time spent in the appointment was 30 minutes and more than 50% was on counseling.  This document serves as a record of services personally performed by Truitt Merle, MD. It was created on her behalf by Brandt Loosen, a trained medical scribe. The creation of this record is based on the scribe's personal observations and the provider's statements to them. This document has been checked and approved by the attending provider.   I have reviewed the above documentation for accuracy and completeness and I agree with the above.  Truitt Merle 10/08/2016  Addendum -Repeat his CBC showed hemoglobin 6.9, I'll arrange 2 units of RBC transfusion tomorrow or Saturday. Other lab results pending  -I'll contact his GI Dr. [name] to request EGD/colonoscopy  asap, for possible GI bleeding  -repeat lab in 2 weeks  -discussed with pt, he agrees    Truitt Merle  10/08/2016

## 2016-10-08 NOTE — Patient Instructions (Signed)
[facility] Discharge Instructions for Patients Receiving Chemotherapy  Today you received the following chemotherapy agents Opdivo.  To help prevent nausea and vomiting after your treatment, we encourage you to take your nausea medication as directed.   If you develop nausea and vomiting that is not controlled by your nausea medication, call the clinic.   BELOW ARE SYMPTOMS THAT SHOULD BE REPORTED IMMEDIATELY:  *FEVER GREATER THAN 100.5 F  *CHILLS WITH OR WITHOUT FEVER  NAUSEA AND VOMITING THAT IS NOT CONTROLLED WITH YOUR NAUSEA MEDICATION  *UNUSUAL SHORTNESS OF BREATH  *UNUSUAL BRUISING OR BLEEDING  TENDERNESS IN MOUTH AND THROAT WITH OR WITHOUT PRESENCE OF ULCERS  *URINARY PROBLEMS  *BOWEL PROBLEMS  UNUSUAL RASH Items with * indicate a potential emergency and should be followed up as soon as possible.  Feel free to call the clinic you have any questions or concerns. The clinic phone number is [phone].  Please show the CHEMO ALERT CARD at check-in to the Emergency Department and triage nurse.    

## 2016-10-09 ENCOUNTER — Ambulatory Visit (HOSPITAL_COMMUNITY)
Admission: RE | Admit: 2016-10-09 | Discharge: 2016-10-09 | Disposition: A | Discharge status: Home / Self Care | Payer: Medicare Other | Source: Ambulatory Visit | Attending: Hematology | Admitting: Hematology

## 2016-10-09 DIAGNOSIS — D62 Acute posthemorrhagic anemia: Secondary | ICD-10-CM

## 2016-10-09 DIAGNOSIS — D649 Anemia, unspecified: Secondary | ICD-10-CM | POA: Diagnosis not present

## 2016-10-09 LAB — HAPTOGLOBIN: Haptoglobin: 194 mg/dL (ref 34–200)

## 2016-10-09 LAB — AFP TUMOR MARKER: AFP, SERUM, TUMOR MARKER: 4.3 ng/mL (ref 0.0–8.3)

## 2016-10-09 LAB — FERRITIN: FERRITIN: 15 ng/mL — AB (ref 22–316)

## 2016-10-09 LAB — ABO/RH: ABO/RH(D): O POS

## 2016-10-09 LAB — IRON AND TIBC
%SAT: 3 % — AB (ref 20–55)
IRON: 19 ug/dL — AB (ref 42–163)
TIBC: 593 ug/dL — AB (ref 202–409)
UIBC: 574 ug/dL — AB (ref 117–376)

## 2016-10-09 LAB — PREPARE RBC (CROSSMATCH)

## 2016-10-09 MED ORDER — SODIUM CHLORIDE 0.9 % IV SOLN
250.0000 mL | Freq: Once | INTRAVENOUS | Status: AC
  Administered 2016-10-09: 250 mL via INTRAVENOUS

## 2016-10-09 MED ORDER — SODIUM CHLORIDE 0.9% FLUSH: 10.0000 mL | Freq: Once | INTRAVENOUS | Status: DC

## 2016-10-09 MED ORDER — HEPARIN SOD (PORK) LOCK FLUSH 100 UNIT/ML IV SOLN: 250.0000 [IU] | INTRAVENOUS | Status: DC | PRN

## 2016-10-09 MED ORDER — SODIUM CHLORIDE 0.9% FLUSH: 3.0000 mL | INTRAVENOUS | Status: DC | PRN

## 2016-10-09 MED ORDER — HEPARIN SOD (PORK) LOCK FLUSH 100 UNIT/ML IV SOLN: 500.0000 [IU] | Freq: Once | INTRAVENOUS | Status: DC

## 2016-10-09 NOTE — Discharge Instructions (Signed)
Blood Transfusion, Care After This sheet gives you information about how to care for yourself after your procedure. Your doctor may also give you more specific instructions. If you have problems or questions, contact your doctor. Follow these instructions at home:  Take over-the-counter and prescription medicines only as told by your doctor.  Go back to your normal activities as told by your doctor.  Follow instructions from your doctor about how to take care of the area where an IV tube was put into your vein (insertion site). Make sure you: ? Wash your hands with soap and water before you change your bandage (dressing). If there is no soap and water, use hand sanitizer. ? Change your bandage as told by your doctor.  Check your IV insertion site every day for signs of infection. Check for: ? More redness, swelling, or pain. ? More fluid or blood. ? Warmth. ? Pus or a bad smell. Contact a doctor if:  You have more redness, swelling, or pain around the IV insertion site..  You have more fluid or blood coming from the IV insertion site.  Your IV insertion site feels warm to the touch.  You have pus or a bad smell coming from the IV insertion site.  Your pee (urine) turns pink, red, or brown.  You feel weak after doing your normal activities. Get help right away if:  You have signs of a serious allergic or body defense (immune) system reaction, including: ? Itchiness. ? Hives. ? Trouble breathing. ? Anxiety. ? Pain in your chest or lower back. ? Fever, flushing, and chills. ? Fast pulse. ? Rash. ? Watery poop (diarrhea). ? Throwing up (vomiting). ? Dark pee. ? Serious headache. ? Dizziness. ? Stiff neck. ? Yellow color in your face or the white parts of your eyes (jaundice). Summary  After a blood transfusion, return to your normal activities as told by your doctor.  Every day, check for signs of infection where the IV tube was put into your vein.  Some signs of  infection are warm skin, more redness and pain, more fluid or blood, and pus or a bad smell where the needle went in.  Contact your doctor if you feel weak or have any unusual symptoms. This information is not intended to replace advice given to you by your health care provider. Make sure you discuss any questions you have with your health care provider. Document Released: 01/26/2014 Document Revised: 08/30/2015 Document Reviewed: 08/30/2015 Elsevier Interactive Patient Education  2017 [employer].  This patient received 2 units of blood today via an IV.

## 2016-10-09 NOTE — Progress Notes (Signed)
1257  Pt admitted to [facility] for transfusion of 2 units PRBC. Pt received transfusion with no adverse reactions noted. Pt alert, oriented and ambulatory at time of discharge. Coolidge Breeze, RN 10/09/2016

## 2016-10-12 ENCOUNTER — Telehealth: Payer: Self-pay | Admitting: Hematology

## 2016-10-12 LAB — TYPE AND SCREEN
ABO/RH(D): O POS
Antibody Screen: NEGATIVE
Unit division: 0
Unit division: 0

## 2016-10-12 LAB — BPAM RBC
Blood Product Expiration Date: 201810152359
Blood Product Expiration Date: 201810152359
ISSUE DATE / TIME: 201809210857
ISSUE DATE / TIME: 201809210857
Unit Type and Rh: 5100
Unit Type and Rh: 5100

## 2016-10-12 MED FILL — oxyCODONE HCL 30 MG TABS: 30 | 20 days supply | Qty: 120 | Fill #0

## 2016-10-12 MED FILL — ALPRAZolam 1 MG TABS: 1 | 30 days supply | Qty: 90 | Fill #0

## 2016-10-12 MED FILL — OxyCONTIN 80 MG T12A: 80 | 30 days supply | Qty: 120 | Fill #0

## 2016-10-12 NOTE — Telephone Encounter (Signed)
Spoke with patient re 9/28 appointments. Patient will get updated scheduled 9/28.

## 2016-10-13 ENCOUNTER — Other Ambulatory Visit: Payer: Self-pay

## 2016-10-13 ENCOUNTER — Ambulatory Visit (INDEPENDENT_AMBULATORY_CARE_PROVIDER_SITE_OTHER): Payer: Medicare Other | Admitting: Internal Medicine

## 2016-10-13 ENCOUNTER — Encounter: Payer: Self-pay | Admitting: Internal Medicine

## 2016-10-13 VITALS — BP 159/86 | HR 99 | Temp 97.4°F | Ht 73.0 in | Wt 204.8 lb

## 2016-10-13 DIAGNOSIS — K7469 Other cirrhosis of liver: Secondary | ICD-10-CM

## 2016-10-13 DIAGNOSIS — Z86010 Personal history of colonic polyps: Secondary | ICD-10-CM

## 2016-10-13 DIAGNOSIS — Z8719 Personal history of other diseases of the digestive system: Secondary | ICD-10-CM

## 2016-10-13 DIAGNOSIS — D649 Anemia, unspecified: Secondary | ICD-10-CM

## 2016-10-13 MED ORDER — PEG 3350-KCL-NA BICARB-NACL 420 G PO SOLR: 4000.0000 mL | ORAL | 0 refills | Status: DC

## 2016-10-13 NOTE — Progress Notes (Signed)
Primary Care Physician:  Antonietta Jewel, MD Primary Gastroenterologist:  Dr.   Pre-Procedure History & Physical: HPI:  Kevin Dillon is a 66 y.o. male here for evaluation of acute on chronic anemia. Patient is a very pleasant 66 year old gentleman with a history of chronic hepatitis C with cirrhosis( eradicated with antiviral therapy). Cirrhosis complicated by recurrent metastatic hepatocellular carcinoma. Recently dropped hemoglobin sub-acutely into the 6.9 range requiring a 2 unit transfusion last week. No hematemesis melena or hematochezia.  He is not known to be Hemoccult positive.  Patient denies reflux symptoms, odynophagia or dysphagia. He states his appetite is well maintained. He denies weight loss. He moves his bowels daily. He is on multiple medications including opioids and benzodiazepines. History of a noncritical Schatzki's ring (no esophageal varices) on EGD  in 2013. History of colonic adenoma removed in 2013.  Patient has been referred for further GI evaluation of acute on chronic anemia.  It is also notable patient is anticoagulated on the Xarelto given history of portal vein thrombosis.  Past Medical History:  Diagnosis Date  . Cancer ([place])    liver  . Cholelithiasis   . Chronic hepatitis C ([place])   . Chronic knee pain   . Chronic left shoulder pain   . CVA (cerebral infarction)   . Gout   . Hepatitis C    genotype 1b.  pt has been vaccinated against hep A and B.  . Hypertension   . Osteoarthritis   . Schatzki's ring   . Tubular adenoma of colon 12/2011    Past Surgical History:  Procedure Laterality Date  . AMPUTATION Left 09/17/2012   Procedure: SMALL FINGER EXTENSOR TENDON REPAIR; METACARPAL LEVEL AMPUTATION RING FINGER; PROXIMAL PHALANX LEVEL AMPUTATION LONG FINGER;  Surgeon: Schuyler Amor, MD;  Location: [place];  Service: Orthopedics;  Laterality: Left;  . Arm surgery     right/plate in arm  . COLONOSCOPY WITH ESOPHAGOGASTRODUODENOSCOPY (EGD)   12/30/2011   RMR: Noncritical Schatzki's ring;  Hiatal hernia, Tubular ADENOMA removed from splenic flexure, otherwise normal colonoscopy  . LEG SURGERY     left  . WOUND EXPLORATION Left 09/17/2012   Procedure: WOUND EXPLORATION;  Surgeon: Schuyler Amor, MD;  Location: [place];  Service: Orthopedics;  Laterality: Left;    Prior to Admission medications   Medication Sig Start Date End Date Taking? Authorizing Provider  ALPRAZolam Duanne Moron) 1 MG tablet Take 1 tablet (1 mg total) by mouth 3 (three) times daily as needed for anxiety. 10/08/16  Yes Truitt Merle, MD  furosemide (LASIX) 20 MG tablet Take 1 tablet by mouth daily. 08/10/16  Yes [provider]  hydrALAZINE (APRESOLINE) 25 MG tablet Take 50 mg by mouth daily.  08/21/15  Yes [provider]  lactulose (CHRONULAC) 10 GM/15ML solution 30 g daily as needed for moderate constipation.  01/06/16  Yes [provider]  megestrol (MEGACE) 40 MG/ML suspension Take 400 mg by mouth daily.  10/07/15  Yes [provider]  Multiple Vitamins-Minerals (CENTRUM SILVER PO) Take 1 tablet by mouth daily.    Yes [provider]  oxyCODONE (OXYCONTIN) 80 mg 12 hr tablet Take 2 tablets (160 mg total) by mouth every 12 (twelve) hours. 10/08/16  Yes Truitt Merle, MD  oxycodone (ROXICODONE) 30 MG immediate release tablet Take 1 tablet (30 mg total) by mouth every 4 (four) hours as needed for pain. 10/08/16  Yes Truitt Merle, MD  PARoxetine (PAXIL) 20 MG tablet Take 20 mg by mouth every  morning.  08/08/12  Yes [provider]  prochlorperazine (COMPAZINE) 10 MG tablet TAKE 1 TABLET BY MOUTH EVERY 8 HOURS AS NEEDED FOR NAUSEA/VOMITING 08/14/16  Yes Truitt Merle, MD  rivaroxaban (XARELTO) 20 MG TABS tablet Take 1 tablet (20 mg total) by mouth daily with supper. 10/08/16  Yes Truitt Merle, MD  cyclobenzaprine (FLEXERIL) 5 MG tablet Take 1 tablet (5 mg total) by mouth 3 (three) times daily as needed for muscle spasms. Patient not taking:  Reported on 10/13/2016 08/27/16   Truitt Merle, MD    Allergies as of 10/13/2016  . (No Known Allergies)    Family History  Problem Relation Age of Onset  . Cirrhosis Father 75  . Cancer Father        liver cancer   . Lung cancer Mother 39  . Colon cancer Neg Hx     Social History   Social History  . Marital status: Single    Spouse name: N/A  . Number of children: 0  . Years of education: N/A   Occupational History  . disabled    Social History Main Topics  . Smoking status: Former Smoker    Packs/day: 0.50    Years: 40.00    Types: Cigarettes    Quit date: 01/19/2012  . Smokeless tobacco: Never Used     Comment: Smokes 1/2 pack of cigarettes daily  . Alcohol use No     Comment: HX 2 beers 3-4 days per week; QUIT DEC 2013  . Drug use: No     Comment: Hx cocaine yrs ago, Last marijuana OCT 2013  . Sexual activity: Not Currently    Partners: Female   Other Topics Concern  . Not on file   Social History Narrative   Lives w/ significant other, [name]    Review of Systems: See HPI, otherwise negative ROS  Physical Exam: BP (!) 159/86   Pulse 99   Temp (!) 97.4 F (36.3 C) (Oral)   Ht 6\' 1"  (1.854 m)   Wt 204 lb 12.8 oz (92.9 kg)   BMI 27.02 kg/m  General:   Alert,  somewhat disheveled  pleasant and cooperative in NAD Neck:  Supple; no masses or thyromegaly. No significant cervical adenopathy. Lungs:  Clear throughout to auscultation.   No wheezes, crackles, or rhonchi. No acute distress. Heart:  Regular rate and rhythm; no murmurs, clicks, rubs,  or gallops. Abdomen: Rotund. Positive bowel sounds soft nontender no obvious mass or organomegaly.  Pulses:  Normal pulses noted. Extremities:  Without clubbing or edema.  Impression:  Pleasant 66 year old gentleman multiple comorbidities including cirrhosis complicated By hepatocellular carcinoma. Recent acute on chronic anemia without obvious GI bleeding. History of colonic adenoma. No recent esophageal  variceal screening examination.  On face value, regardless of GI bleeding or not, I agree with Dr. Burr Medico, patient ought to undergo an EGD colonoscopy. I told the patient if he were to found have large esophageal varices he may undergo esophageal band ligation.  His Xarelto will need to be stopped 4 days prior to these procedures.   Recommendations:  EGD and colonoscopy in the near future. Will need to be done with propofol. The risks, benefits, limitations, imponderables and alternatives regarding both EGD and colonoscopy have been reviewed with the patient. Questions have been answered. All parties agreeable. He will need an extra half day of clear liquids as part of his colonoscopy prep. Further recommendations to follow.      Notice: This dictation was prepared  with Dragon dictation along with smaller phrase technology. Any transcriptional errors that result from this process are unintentional and may not be corrected upon review.

## 2016-10-13 NOTE — Patient Instructions (Addendum)
Plan EGD and colonoscopy - marked anemia, Hx of cirrhosis and colon polyps - diagnostic for anemia - propofol  Split prep - extra 1/2 day of clear liquids  Stop Xarelto 4 days prior to procedure

## 2016-10-14 NOTE — Progress Notes (Signed)
[place]  [phone] [phone]  Clinic Follow up Note   Patient Care Team: Antonietta Jewel, MD as PCP - General (Internal Medicine) [name], Cristopher Estimable, MD as Attending Physician (Gastroenterology) Antonietta Jewel, MD as Referring Physician (Internal Medicine) Truitt Merle, MD as Consulting Physician (Hematology) [name], CRNP as Nurse Practitioner (Nurse Practitioner) 10/16/2016   CHIEF COMPLAINTS:  Follow up recurrent [place]   Oncology History   Hepatocellular carcinoma [facility])   Staging form: Liver (Excluding Intrahepatic Bile Ducts), AJCC 7th Edition   - Clinical stage from 04/16/2012: Stage II (T2(m), N0, M0) - Signed by Truitt Merle, MD on 10/12/2015   - Pathologic stage from 04/16/2013: Stage II (T2, N0, cM0) - Signed by Truitt Merle, MD on 10/12/2015      Hepatocellular carcinoma ([place])   05/1927 Imaging         09/14/2012 Tumor Marker    AFP 28.9      04/16/2013 Imaging    Abdominal MRI with and without contrast reviewed multifocal (4) liver lesions in both left and right lobes, most consistent with HCC, largest measuring 2.7 cm      04/16/2013 Initial Diagnosis    Hepatocellular carcinoma ([place])      04/28/2013 Procedure    Right TACE with lipiodol       06/15/2013 Procedure    Left TACE with [facility]       07/14/2013 Procedure    Right TACE with [facility]      09/05/2015 Imaging    CT abdomen with and without contrast showed a new 5.0 x 2.3 cm mass in the hepatic segment 8, invading and occluding the right anterior portal vein, most compatible with [place]. A portacaval node measuring 3.0 x 1.4 cm, previously 2.9 x 1.1 cm.      10/11/2015 Tumor Marker    AFP 5.3      11/11/2015 - 02/03/2016 Chemotherapy    sorafenib 200mg  bid started on 11/11/2015, increased to 400mg  bid in 2 weeks, stopped due to poor tolerance and probable disease progression       11/14/2015 Imaging    CT CAP 11/14/2015 IMPRESSION: Chest Impression: 1. RIGHT upper lobe pulmonary  nodule is indeterminate. No comparison available. 2. Ground-glass opacity and peripheral nodules in the RIGHT middle lobe appear post infectious or inflammatory.  Abdomen / Pelvis Impression: 1. 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 Thrombus also likely within the LEFT and RIGHT portal vein. Difficult to distinguish tumor thrombus versus bland thrombus. 2. Individual lesions are difficult to define in the RIGHT hepatic lobe. There is overall impression of progression of disease in the RIGHT hepatic lobe with multiple ill-defined enhancing lesions. 3. Periportal and shotty retroperitoneal adenopathy similar to prior.      01/25/2016 Imaging    CT Abdomen Pelvis w/ Contrast IMPRESSION: 1. Findings consistent with multifocal liver carcinoma with extensive portal vein thrombosis, and subsequent development of porta hepatis venous collaterals. Thrombus in the central portal vein is no longer expansile, but still expands the more peripheral portal vein and the right and left portal veins. There are few right liver bile ducts there are now dilated, which suggests mild progression of liver disease. 2. There is mild splenomegaly consistent or venous hypertension, which has mildly increased from the prior CT. 3. There are new lung abnormalities with interstitial thickening and ill-defined peribronchovascular nodular opacities mostly at the right lung base with a  smaller area noted along the medial left lower lobe. The etiology may be infectious. It may be inflammatory, possibly related to chemotherapy. Neoplastic disease is also possible. 4. There is no other evidence of metastatic disease within the abdomen or pelvis. No acute findings within the abdomen or pelvis      01/25/2016 - 01/25/2016 Hospital Admission    Pt was seen in ED for worsening abdominal pain, treated with pain meds        02/04/2016 -  Chemotherapy    The patient started on immunotherapy treatment nivolumab every 2 weeks; on 04/30/16 dose was reduced to 420mg  every 3 weeks       02/14/2016 Imaging    CT CAP w/ contrast 1. Slight interval increase in size of the mediastinal lymph nodes. 2. Persistent extensive lower lobe peribronchial thickening and patchy infiltrates. Possible aspiration pneumonia. 3. Persistent tree-in-bud appearance in the right middle lobe, likely chronic inflammation or atypical infection. 4. Improved CT appearance of the liver. Two small residual arterial phase enhancing lesions are identified. 5. Chronic portal vein thrombosis with cavernous transformation of portal venous collaterals. Esophageal varices are again noted. 6. Stable upper abdominal lymph nodes likely related to cirrhosis.       04/16/2016 Tumor Marker    AFP 4.1       04/17/2016 Imaging     CT CAP W PELVIS IMPRESSION: 1. Stable mild mediastinal lymphadenopathy. 2. Interval improvement in the bibasilar peribronchial thickening, airway impaction, and peribronchovascular nodularity. Imaging features suggest marked improvement in bilateral low the atypical infection. Changes in the right middle lobe with more stable and may represent scarring from previous infectious/inflammatory etiology. 3. The small hypervascular lesions seen in the anterior left liver on the previous study are not evident today. There is some subtle, ill-defined hyperenhancement in the central left liver which is nonspecific. Attention to this area on followup imaging recommended. 4. Chronic portal vein inclusion with cavernous transformation in the porta hepatis and paraesophageal varices. 5. Stable appearance hepatoduodenal and retroperitoneal lymphadenopathy, likely chronic and related to liver disease.      04/17/2016 Imaging    CT CAP CONTRAST IMPRESSION: 1. Stable mild mediastinal lymphadenopathy. 2. Interval improvement in the bibasilar  peribronchial thickening, airway impaction, and peribronchovascular nodularity. Imaging features suggest marked improvement in bilateral low the atypical infection. Changes in the right middle lobe with more stable and may represent scarring from previous infectious/inflammatory etiology. 3. The small hypervascular lesions seen in the anterior left liver on the previous study are not evident today. There is some subtle, ill-defined hyperenhancement in the central left liver which is nonspecific. Attention to this area on followup imaging recommended. 4. Chronic portal vein inclusion with cavernous transformation in the porta hepatis and paraesophageal varices. 5. Stable appearance hepatoduodenal and retroperitoneal lymphadenopathy, likely chronic and related to liver disease.      05/21/2016 Imaging    MR Abdomen W WO Contrast IMPRESSION: 1. Limited motion degraded scan. Cirrhosis. No evidence of a liver mass within these limitations. 2. Mild splenomegaly. No ascites. Stable mild paraumbilical and gastroesophageal varices. 3. Stable nonspecific mild retroperitoneal lymphadenopathy. 4. 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      06/04/2016 Tumor Marker    AFP 3.5       08/25/2016 Imaging    MR Abdomen w wo contrast  IMPRESSION: 1. Mild-to-moderate motion degradation, preferentially involving the pre and postcontrast dynamic images. On follow-up, given the extent of motion on this exam, multiphase  CT may be preferred. 2. Given these limitations, no evidence of hepatocellular carcinoma. 3. Marked cirrhosis and portal venous hypertension. 4. Slight increase in abdominal adenopathy, which is most likely reactive. Recommend attention on follow-up. 5. Cholelithiasis.        HISTORY OF PRESENTING ILLNESS (10/11/2015):  Kevin Dillon 66 y.o. male with past medical history of treated hepatitis C, liver cirrhosis, and hepatocellular  carcinoma is here because of recurrent hepatocellular carcinoma. He is accompanied by his significant other [name] and her sister.  He was diagnosed with hepatocellular carcinoma in 2015, his initial image result is not available and staging is unknown. He was seen by interventional radiologist Dr. Delice Lesch at [facility] in [place] and underwent TACE procedure 3 times in 2015. He was subsequently followed, and recently repeated CT scan showed a new 5.0 cm mass in segment 8, with portal vein invasion. He was felt not to be a candidate for further liver targeted therapy, and was referred to [place] for further systemic therapy.  He complains about mid epigastric pain for the past 2 years, which significantly improved after his TACE procedure in 2015. It has been getting worse lately in the past 6 months. He states he gets about 7-8 out of 10, persistent, he has been taking oxycodone 20 mg every 6 hours, but his pain is not well controlled. He is quite fatigued, is low appetite. He last 40 lbs in the pat 4 months. He is able to function at home, in trying to remain to be physically active.  His hepatitis C was successfully treated. He has history of hepatitis C, complicated with ascites and encephalopathy, but it has been well controlled with medical management.   PREVIOUS THERAPY: Nivolumab 240mg  every 2 weeks, started on 02/04/2015; on 04/30/16 switched to every 3 weeks due to fatigue; change from 240mg  to 480mg  every 4 weeks starting 09/03/2016  CURRENT THERAPY: Nivolumab 480 mg every 4 weeks starting 09/03/16  INTERIM HISTORY:  Diron returns for follow up as scheduled. He reports not feeling well today but it is difficult to characterize. He has chronic constipation, controlled with miralax, small bowel movement this morning. He denies nausea or vomiting, no abdominal pain, no increased swelling or bloating. His appetite is fair. Mild dyspnea on exertion that resolves with rest. He denies cough, chest  pain, fever, chills, or bleeding.   MEDICAL HISTORY:  Past Medical History:  Diagnosis Date  . Cancer ([place])    liver  . Cholelithiasis   . Chronic hepatitis C ([place])   . Chronic knee pain   . Chronic left shoulder pain   . CVA (cerebral infarction)   . Gout   . Hepatitis C    genotype 1b.  pt has been vaccinated against hep A and B.  . Hypertension   . Osteoarthritis   . Schatzki's ring   . Tubular adenoma of colon 12/2011   SURGICAL HISTORY: Past Surgical History:  Procedure Laterality Date  . AMPUTATION Left 09/17/2012   Procedure: SMALL FINGER EXTENSOR TENDON REPAIR; METACARPAL LEVEL AMPUTATION RING FINGER; PROXIMAL PHALANX LEVEL AMPUTATION LONG FINGER;  Surgeon: Schuyler Amor, MD;  Location: [facility];  Service: Orthopedics;  Laterality: Left;  . Arm surgery     right/plate in arm  . COLONOSCOPY WITH ESOPHAGOGASTRODUODENOSCOPY (EGD)  12/30/2011   RMR: Noncritical Schatzki's ring;  Hiatal hernia, Tubular ADENOMA removed from splenic flexure, otherwise normal colonoscopy  . LEG SURGERY     left  . WOUND EXPLORATION Left 09/17/2012  Procedure: WOUND EXPLORATION;  Surgeon: Schuyler Amor, MD;  Location: [facility];  Service: Orthopedics;  Laterality: Left;   SOCIAL HISTORY: Social History   Social History  . Marital status: Single    Spouse name: N/A  . Number of children: 0  . Years of education: N/A   Occupational History  . disabled    Social History Main Topics  . Smoking status: Former Smoker    Packs/day: 0.50    Years: 40.00    Types: Cigarettes    Quit date: 01/19/2012  . Smokeless tobacco: Never Used     Comment: Smokes 1/2 pack of cigarettes daily  . Alcohol use No     Comment: HX 2 beers 3-4 days per week; QUIT DEC 2013  . Drug use: No     Comment: Hx cocaine yrs ago, Last marijuana OCT 2013  . Sexual activity: Not Currently    Partners: Female   Other Topics Concern  . Not on file   Social History Narrative   Lives w/ significant other,  [name]   FAMILY HISTORY: Family History  Problem Relation Age of Onset  . Cirrhosis Father 67  . Cancer Father        liver cancer   . Lung cancer Mother 63  . Colon cancer Neg Hx    ALLERGIES:  has No Known Allergies.  MEDICATIONS:  Current Outpatient Prescriptions  Medication Sig Dispense Refill  . ALPRAZolam (XANAX) 1 MG tablet Take 1 tablet (1 mg total) by mouth 3 (three) times daily as needed for anxiety. 90 tablet 0  . cyclobenzaprine (FLEXERIL) 5 MG tablet Take 1 tablet (5 mg total) by mouth 3 (three) times daily as needed for muscle spasms. 30 tablet 1  . furosemide (LASIX) 20 MG tablet Take 1 tablet by mouth daily.  3  . hydrALAZINE (APRESOLINE) 25 MG tablet Take 50 mg by mouth daily.     Marland Kitchen lactulose (CHRONULAC) 10 GM/15ML solution 30 g daily as needed for moderate constipation.     . megestrol (MEGACE) 40 MG/ML suspension Take 400 mg by mouth daily.     . Multiple Vitamins-Minerals (CENTRUM SILVER PO) Take 1 tablet by mouth daily.     Marland Kitchen oxyCODONE (OXYCONTIN) 80 mg 12 hr tablet Take 2 tablets (160 mg total) by mouth every 12 (twelve) hours. 120 tablet 0  . oxycodone (ROXICODONE) 30 MG immediate release tablet Take 1 tablet (30 mg total) by mouth every 4 (four) hours as needed for pain. 120 tablet 0  . PARoxetine (PAXIL) 20 MG tablet Take 20 mg by mouth every morning.     . polyethylene glycol-electrolytes (TRILYTE) 420 g solution Take 4,000 mLs by mouth as directed. 4000 mL 0  . prochlorperazine (COMPAZINE) 10 MG tablet TAKE 1 TABLET BY MOUTH EVERY 8 HOURS AS NEEDED FOR NAUSEA/VOMITING 30 tablet 1  . rivaroxaban (XARELTO) 20 MG TABS tablet Take 1 tablet (20 mg total) by mouth daily with supper. 30 tablet 5   No current facility-administered medications for this visit.    REVIEW OF SYSTEMS:  Constitutional: Denies fevers, chills or abnormal night sweats  (+) weight fluctuation  Eyes: Denies blurriness of vision, double vision or watery eyes Ears, nose, mouth,  throat, and face: Denies mucositis or sore throat Respiratory: Denies cough or wheezes (+) dyspnea on exertion, resolves with rest  Cardiovascular: Denies palpitation, chest discomfort or lower extremity swelling Gastrointestinal:  Denies nausea, vomiting, diarrhea, heartburn or change in bowel habits (+) constipation  Skin:  Denies abnormal skin rashes Lymphatics: Denies new lymphadenopathy or easy bruising Neurological:Denies numbness, tingling or new weaknesses Behavioral/Psych: normal Muscuoskeletal: denies pain  All other systems were reviewed with the patient and are negative.  PHYSICAL EXAMINATION  ECOG PERFORMANCE STATUS: 2  Vitals:   10/16/16 1325  BP: (!) 124/103  Pulse: 83  Resp: 18  Temp: 97.8 F (36.6 C)  SpO2: 97%   Filed Weights   10/16/16 1325  Weight: 205 lb 9.6 oz (93.3 kg)    GENERAL:alert, calm, cooperative  SKIN: skin color, texture, turgor are normal, no rashes or significant lesions EYES: normal, conjunctiva are pink and non-injected, sclera clear OROPHARYNX:no exudate, no erythema and lips, buccal mucosa, and tongue normal  NECK: supple, thyroid normal size, non-tender, without nodularity LYMPH:  no palpable cervical or supraclavicular lymphadenopathy  LUNGS: clear to auscultation bilaterally with normal breathing effort HEART: regular rate & rhythm, S1 and S2 present, no murmurs, no lower extremity edema ABDOMEN: protuberant, nontender, normoactive bowel sounds  Musculoskeletal:no cyanosis of digits and no clubbing  PSYCH: alert & oriented x 3 with fluent speech NEURO: no focal motor/sensory deficits  LABORATORY DATA:  I have reviewed the data as listed CBC Latest Ref Rng & Units 10/16/2016 10/08/2016 10/08/2016  WBC 4.0 - 10.3 10e3/uL 9.4 6.7 7.8  Hemoglobin 13.0 - 17.1 g/dL 10.2(L) 6.9(LL) 7.3(L)  Hematocrit 38.4 - 49.9 % 32.4(L) 22.7(L) 22.7(L)  Platelets 140 - 400 10e3/uL 201 213 263   CMP Latest Ref Rng & Units 10/16/2016 10/08/2016 09/03/2016    Glucose 70 - 140 mg/dl 144(H) 87 137  BUN 7.0 - 26.0 mg/dL 13.2 10.0 23.4  Creatinine 0.7 - 1.3 mg/dL 1.5(H) 1.5(H) 1.5(H)  Sodium 136 - 145 mEq/L 139 137 138  Potassium 3.5 - 5.1 mEq/L 3.7 4.1 3.6  Chloride 101 - 111 mmol/L - - -  CO2 22 - 29 mEq/L 23 24 22   Calcium 8.4 - 10.4 mg/dL 9.6 9.2 9.5  Total Protein 6.4 - 8.3 g/dL 8.2 7.5 7.5  Total Bilirubin 0.20 - 1.20 mg/dL 0.37 0.36 0.49  Alkaline Phos 40 - 150 U/L 105 106 100  AST 5 - 34 U/L 23 24 25   ALT 0 - 55 U/L 12 9 17    AFP: 09/14/2012: 28.9 10/11/2015: 5.3 11/01/2015: 3.7 12/02/2015: 3.7 01/22/2016: 3.4 02/18/2016: 7.1 03/17/16: 5.8 04/16/16: 4.1 06/04/2016: 3.5 07/16/2016:4.4 08/27/2016: 5.6 10/08/16: 4.3  RADIOGRAPHIC STUDIES: I have personally reviewed the radiological images as listed and agreed with the findings in the report.  04/17/16 CT CAP W PELVIS IMPRESSION: 1. Stable mild mediastinal lymphadenopathy. 2. Interval improvement in the bibasilar peribronchial thickening, airway impaction, and peribronchovascular nodularity. Imaging features suggest marked improvement in bilateral low the atypical infection. Changes in the right middle lobe with more stable and may represent scarring from previous infectious/inflammatory etiology. 3. The small hypervascular lesions seen in the anterior left liver on the previous study are not evident today. There is some subtle, ill-defined hyperenhancement in the central left liver which is nonspecific. Attention to this area on followup imaging recommended. 4. Chronic portal vein inclusion with cavernous transformation in the porta hepatis and paraesophageal varices. 5. Stable appearance hepatoduodenal and retroperitoneal lymphadenopathy, likely chronic and related to liver disease.  MR Abdomen W WO Contrast 05/21/16 IMPRESSION: 1. Limited motion degraded scan. Cirrhosis. No evidence of a liver mass within these limitations. 2. Mild splenomegaly. No ascites. Stable mild  paraumbilical and gastroesophageal varices. 3. Stable nonspecific mild retroperitoneal lymphadenopathy. 4. Stable chronic main portal vein occlusion with  cavernous transformation of the portal vein.  MR Abdomen w wo contrast 08/25/2016 IMPRESSION: 1. Mild-to-moderate motion degradation, preferentially involving the pre and postcontrast dynamic images. On follow-up, given the extent of motion on this exam, multiphase CT may be preferred. 2. Given these limitations, no evidence of hepatocellular carcinoma. 3. Marked cirrhosis and portal venous hypertension. 4. Slight increase in abdominal adenopathy, which is most likely reactive. Recommend attention on follow-up. 5. Cholelithiasis.  ASSESSMENT & PLAN: 66 y.o. Caucasian male with past medical history of successfully treated hepatitis C, liver cirrhosis, history of ascites and encephalopathy, recurrent HCC  1. Recurrent HCC, initially stage II -Restaging MRI from 08/25/2016, which showed no visible liver mass, but the images quality with significant compromise due to the motion.  -Nivolumab was changed to 480mg  every 4 weeks, he is tolerating well  -He has poor IV access but previously declined a port a cath -Labs reviewed. Hgb 10.2 today; he received 2 units RBC on 10/09/16 for Hgb 6.9 -Next nivolumab due 11/05/16 -Plan for restaging CT in Nov  2. Abdominal pain/ back pain  -Secondary to [facility] and portal vein thrombosis -He has been previously encouraged to see pain management clinic, he declined. -Oxycontin, oxycodone, and flexeril on board for symptom management -will continue to monitor   3. Portal vein thrombosis -continue xarelto   7. HTN -He is taking hydralazine 50 mg daily; will continue follow-up with his primary care physician -BP 124/103 today, will recheck in infusion area with Feraheme   12. Worsening anemia -His hemoglobin dropped to 7.3 today, from 9.4 5 weeks ago  -He is on Xarelto, no obvious overt bleeding, iron  studies indicate iron deficiency anemia -He saw GI on 10/13/16 and is scheduled for endoscopy/colonoscopy to rule out GI bleeding on 11/02/16 -He will receive IV feraheme today 10/16/16; I reviewed the purpose of iron infusion and possible side effects; he agrees to proceed   Plan  -IV feraheme today -recheck BP in infusion area -f/u GI and endoscopy/colonoscopy 11/02/16 -lab, f/u, chemo nivolumab on 11/05/16  The case was discussed with Dr. Burr Medico. All questions were answered. The patient knows to call the clinic with any problems, questions or concerns.I spent 20 minutes counseling the patient face to face. The total time spent in the appointment was 30 minutes and more than 50% was on counseling.  This document was created by Joslyn Devon, a trained medical scribe. However, the scribe was not present for this patient encounter. The above record is based on the provider's assessment only.  Alla Feeling AGNP-C 10/16/2016

## 2016-10-16 ENCOUNTER — Ambulatory Visit: Payer: Medicare Other

## 2016-10-16 ENCOUNTER — Other Ambulatory Visit (HOSPITAL_BASED_OUTPATIENT_CLINIC_OR_DEPARTMENT_OTHER): Payer: Medicare Other

## 2016-10-16 ENCOUNTER — Telehealth: Payer: Self-pay | Admitting: Hematology

## 2016-10-16 ENCOUNTER — Other Ambulatory Visit: Payer: Self-pay | Admitting: Hematology

## 2016-10-16 ENCOUNTER — Ambulatory Visit (HOSPITAL_BASED_OUTPATIENT_CLINIC_OR_DEPARTMENT_OTHER): Payer: Medicare Other | Admitting: Hematology

## 2016-10-16 ENCOUNTER — Encounter: Payer: Self-pay | Admitting: Hematology

## 2016-10-16 VITALS — BP 124/103 | HR 83 | Temp 97.8°F | Resp 18 | Ht 73.0 in | Wt 205.6 lb

## 2016-10-16 DIAGNOSIS — D500 Iron deficiency anemia secondary to blood loss (chronic): Secondary | ICD-10-CM

## 2016-10-16 DIAGNOSIS — D509 Iron deficiency anemia, unspecified: Secondary | ICD-10-CM | POA: Diagnosis not present

## 2016-10-16 DIAGNOSIS — M545 Low back pain: Secondary | ICD-10-CM

## 2016-10-16 DIAGNOSIS — T451X5A Adverse effect of antineoplastic and immunosuppressive drugs, initial encounter: Principal | ICD-10-CM

## 2016-10-16 DIAGNOSIS — Z7901 Long term (current) use of anticoagulants: Secondary | ICD-10-CM | POA: Diagnosis not present

## 2016-10-16 DIAGNOSIS — R109 Unspecified abdominal pain: Secondary | ICD-10-CM

## 2016-10-16 DIAGNOSIS — D6481 Anemia due to antineoplastic chemotherapy: Secondary | ICD-10-CM

## 2016-10-16 DIAGNOSIS — C220 Liver cell carcinoma: Secondary | ICD-10-CM

## 2016-10-16 DIAGNOSIS — I878 Other specified disorders of veins: Secondary | ICD-10-CM

## 2016-10-16 DIAGNOSIS — Z86718 Personal history of other venous thrombosis and embolism: Secondary | ICD-10-CM

## 2016-10-16 LAB — CBC WITH DIFFERENTIAL/PLATELET
BASO%: 0.3 % (ref 0.0–2.0)
Basophils Absolute: 0 10*3/uL (ref 0.0–0.1)
EOS ABS: 0.2 10*3/uL (ref 0.0–0.5)
EOS%: 2 % (ref 0.0–7.0)
HCT: 32.4 % — ABNORMAL LOW (ref 38.4–49.9)
HGB: 10.2 g/dL — ABNORMAL LOW (ref 13.0–17.1)
LYMPH%: 33.3 % (ref 14.0–49.0)
MCH: 25.1 pg — AB (ref 27.2–33.4)
MCHC: 31.5 g/dL — ABNORMAL LOW (ref 32.0–36.0)
MCV: 79.8 fL (ref 79.3–98.0)
MONO#: 0.9 10*3/uL (ref 0.1–0.9)
MONO%: 9.2 % (ref 0.0–14.0)
NEUT%: 55.2 % (ref 39.0–75.0)
NEUTROS ABS: 5.2 10*3/uL (ref 1.5–6.5)
Platelets: 201 10*3/uL (ref 140–400)
RBC: 4.06 10*6/uL — ABNORMAL LOW (ref 4.20–5.82)
RDW: 16.6 % — ABNORMAL HIGH (ref 11.0–14.6)
WBC: 9.4 10*3/uL (ref 4.0–10.3)
lymph#: 3.1 10*3/uL (ref 0.9–3.3)

## 2016-10-16 LAB — COMPREHENSIVE METABOLIC PANEL
ALBUMIN: 4.1 g/dL (ref 3.5–5.0)
ALK PHOS: 105 U/L (ref 40–150)
ALT: 12 U/L (ref 0–55)
AST: 23 U/L (ref 5–34)
Anion Gap: 10 mEq/L (ref 3–11)
BILIRUBIN TOTAL: 0.37 mg/dL (ref 0.20–1.20)
BUN: 13.2 mg/dL (ref 7.0–26.0)
CALCIUM: 9.6 mg/dL (ref 8.4–10.4)
CO2: 23 mEq/L (ref 22–29)
Chloride: 105 mEq/L (ref 98–109)
Creatinine: 1.5 mg/dL — ABNORMAL HIGH (ref 0.7–1.3)
EGFR: 47 mL/min/{1.73_m2} — AB (ref >90)
Glucose: 144 mg/dl — ABNORMAL HIGH (ref 70–140)
POTASSIUM: 3.7 meq/L (ref 3.5–5.1)
Sodium: 139 mEq/L (ref 136–145)
TOTAL PROTEIN: 8.2 g/dL (ref 6.4–8.3)

## 2016-10-16 NOTE — Telephone Encounter (Signed)
Gave patient AVS and calendar of upcoming October appointments °

## 2016-10-16 NOTE — Addendum Note (Signed)
Addended by: Ardeen Garland on: 10/16/2016 03:38 PM   Modules accepted: Orders

## 2016-10-16 NOTE — Progress Notes (Addendum)
Five unsuccessful venipunctures today . Pt declines further venipunctures and wants to leave. He wants port a cath -order placed.

## 2016-10-19 ENCOUNTER — Other Ambulatory Visit: Payer: Self-pay | Admitting: Radiology

## 2016-10-20 ENCOUNTER — Other Ambulatory Visit: Payer: Self-pay | Admitting: General Surgery

## 2016-10-21 ENCOUNTER — Other Ambulatory Visit: Payer: Self-pay | Admitting: Hematology

## 2016-10-21 ENCOUNTER — Ambulatory Visit (HOSPITAL_COMMUNITY)
Admission: RE | Admit: 2016-10-21 | Discharge: 2016-10-21 | Disposition: A | Discharge status: Home / Self Care | Payer: Medicare Other | Source: Ambulatory Visit | Attending: Hematology | Admitting: Hematology

## 2016-10-21 ENCOUNTER — Encounter (HOSPITAL_COMMUNITY): Payer: Self-pay

## 2016-10-21 DIAGNOSIS — I878 Other specified disorders of veins: Secondary | ICD-10-CM

## 2016-10-21 DIAGNOSIS — Z7901 Long term (current) use of anticoagulants: Secondary | ICD-10-CM | POA: Insufficient documentation

## 2016-10-21 DIAGNOSIS — I10 Essential (primary) hypertension: Secondary | ICD-10-CM | POA: Diagnosis not present

## 2016-10-21 DIAGNOSIS — M25512 Pain in left shoulder: Secondary | ICD-10-CM | POA: Insufficient documentation

## 2016-10-21 DIAGNOSIS — M109 Gout, unspecified: Secondary | ICD-10-CM | POA: Diagnosis not present

## 2016-10-21 DIAGNOSIS — B182 Chronic viral hepatitis C: Secondary | ICD-10-CM | POA: Insufficient documentation

## 2016-10-21 DIAGNOSIS — Z8673 Personal history of transient ischemic attack (TIA), and cerebral infarction without residual deficits: Secondary | ICD-10-CM | POA: Insufficient documentation

## 2016-10-21 DIAGNOSIS — K7460 Unspecified cirrhosis of liver: Secondary | ICD-10-CM | POA: Diagnosis not present

## 2016-10-21 DIAGNOSIS — C220 Liver cell carcinoma: Secondary | ICD-10-CM | POA: Diagnosis not present

## 2016-10-21 DIAGNOSIS — Z8505 Personal history of malignant neoplasm of liver: Secondary | ICD-10-CM | POA: Diagnosis not present

## 2016-10-21 DIAGNOSIS — M1990 Unspecified osteoarthritis, unspecified site: Secondary | ICD-10-CM | POA: Insufficient documentation

## 2016-10-21 DIAGNOSIS — I81 Portal vein thrombosis: Secondary | ICD-10-CM | POA: Diagnosis not present

## 2016-10-21 DIAGNOSIS — M25569 Pain in unspecified knee: Secondary | ICD-10-CM | POA: Insufficient documentation

## 2016-10-21 DIAGNOSIS — Z87891 Personal history of nicotine dependence: Secondary | ICD-10-CM | POA: Insufficient documentation

## 2016-10-21 DIAGNOSIS — Z5111 Encounter for antineoplastic chemotherapy: Secondary | ICD-10-CM | POA: Diagnosis not present

## 2016-10-21 DIAGNOSIS — D649 Anemia, unspecified: Secondary | ICD-10-CM | POA: Insufficient documentation

## 2016-10-21 DIAGNOSIS — G8929 Other chronic pain: Secondary | ICD-10-CM | POA: Insufficient documentation

## 2016-10-21 HISTORY — PX: IR FLUORO GUIDE PORT INSERTION RIGHT: IMG5741

## 2016-10-21 HISTORY — PX: IR US GUIDE VASC ACCESS RIGHT: IMG2390

## 2016-10-21 LAB — PROTIME-INR
INR: 1.18
Prothrombin Time: 15 seconds (ref 11.4–15.2)

## 2016-10-21 LAB — CBC WITH DIFFERENTIAL/PLATELET
BASOS ABS: 0 10*3/uL (ref 0.0–0.1)
Basophils Relative: 0 %
Eosinophils Absolute: 0.2 10*3/uL (ref 0.0–0.7)
Eosinophils Relative: 3 %
HEMATOCRIT: 29.5 % — AB (ref 39.0–52.0)
Hemoglobin: 9 g/dL — ABNORMAL LOW (ref 13.0–17.0)
LYMPHS ABS: 2.9 10*3/uL (ref 0.7–4.0)
LYMPHS PCT: 32 %
MCH: 24.4 pg — ABNORMAL LOW (ref 26.0–34.0)
MCHC: 30.5 g/dL (ref 30.0–36.0)
MCV: 79.9 fL (ref 78.0–100.0)
MONO ABS: 0.7 10*3/uL (ref 0.1–1.0)
Monocytes Relative: 8 %
NEUTROS ABS: 5.2 10*3/uL (ref 1.7–7.7)
Neutrophils Relative %: 57 %
Platelets: 199 10*3/uL (ref 150–400)
RBC: 3.69 MIL/uL — AB (ref 4.22–5.81)
RDW: 17.2 % — ABNORMAL HIGH (ref 11.5–15.5)
WBC: 9.2 10*3/uL (ref 4.0–10.5)

## 2016-10-21 LAB — BASIC METABOLIC PANEL
ANION GAP: 9 (ref 5–15)
BUN: 18 mg/dL (ref 6–20)
CHLORIDE: 107 mmol/L (ref 101–111)
CO2: 21 mmol/L — ABNORMAL LOW (ref 22–32)
Calcium: 8.8 mg/dL — ABNORMAL LOW (ref 8.9–10.3)
Creatinine, Ser: 1.38 mg/dL — ABNORMAL HIGH (ref 0.61–1.24)
GFR calc Af Amer: >60 mL/min (ref >60)
GFR, EST NON AFRICAN AMERICAN: 52 mL/min — AB (ref >60)
GLUCOSE: 107 mg/dL — AB (ref 65–99)
POTASSIUM: 4.1 mmol/L (ref 3.5–5.1)
Sodium: 137 mmol/L (ref 135–145)

## 2016-10-21 MED ORDER — SODIUM CHLORIDE 0.9 % IV SOLN
INTRAVENOUS | Status: DC
  Administered 2016-10-21: 13:00:00 via INTRAVENOUS

## 2016-10-21 MED ORDER — MIDAZOLAM HCL 2 MG/2ML IJ SOLN
INTRAMUSCULAR | Status: AC
  Filled 2016-10-21: qty 4

## 2016-10-21 MED ORDER — MIDAZOLAM HCL 2 MG/2ML IJ SOLN
INTRAMUSCULAR | Status: AC | PRN
  Administered 2016-10-21 (×4): 1 mg via INTRAVENOUS

## 2016-10-21 MED ORDER — LIDOCAINE-EPINEPHRINE (PF) 2 %-1:200000 IJ SOLN
INTRAMUSCULAR | Status: AC | PRN
  Administered 2016-10-21: 20 mL

## 2016-10-21 MED ORDER — CEFAZOLIN SODIUM-DEXTROSE 2-4 GM/100ML-% IV SOLN
INTRAVENOUS | Status: AC
  Filled 2016-10-21: qty 100

## 2016-10-21 MED ORDER — HEPARIN SOD (PORK) LOCK FLUSH 100 UNIT/ML IV SOLN
INTRAVENOUS | Status: AC
  Filled 2016-10-21: qty 5

## 2016-10-21 MED ORDER — FENTANYL CITRATE (PF) 100 MCG/2ML IJ SOLN
INTRAMUSCULAR | Status: AC
  Filled 2016-10-21: qty 4

## 2016-10-21 MED ORDER — FENTANYL CITRATE (PF) 100 MCG/2ML IJ SOLN
INTRAMUSCULAR | Status: AC | PRN
  Administered 2016-10-21 (×4): 50 ug via INTRAVENOUS

## 2016-10-21 MED ORDER — LIDOCAINE-EPINEPHRINE (PF) 2 %-1:200000 IJ SOLN
INTRAMUSCULAR | Status: AC
  Filled 2016-10-21: qty 20

## 2016-10-21 MED ORDER — CEFAZOLIN SODIUM-DEXTROSE 2-4 GM/100ML-% IV SOLN
2.0000 g | INTRAVENOUS | Status: AC
  Administered 2016-10-21: 2 g via INTRAVENOUS

## 2016-10-21 MED FILL — FUROSEMIDE 20 MG TAB: 20 | 30 days supply | Qty: 30 | Fill #2

## 2016-10-21 NOTE — Sedation Documentation (Signed)
Patient denies pain and is resting comfortably.  

## 2016-10-21 NOTE — Procedures (Signed)
Pre Procedure Dx: [facility] Procedural Dx: Same  Successful placement of right IJ approach port-a-cath with tip at the superior caval atrial junction. The catheter is ready for immediate use.  Estimated Blood Loss: Minimal  Complications: None immediate.  Ronny Bacon, MD Pager #: [phone]

## 2016-10-21 NOTE — Patient Instructions (Signed)
Kevin Dillon  10/21/2016     @PREFPERIOPPHARMACY @   Your procedure is scheduled on  11/02/2016   Report to [facility] at   84  A.M.  Call this number if you have problems the morning of surgery:  [phone]   Remember:  Do not eat food or drink liquids after midnight.  Take these medicines the morning of surgery with A SIP OF WATER  Xanax, flexaril, oxycodone, paxil, compazine.   Do not wear jewelry, make-up or nail polish.  Do not wear lotions, powders, or perfumes, or deoderant.  Do not shave 48 hours prior to surgery.  Men may shave face and neck.  Do not bring valuables to the hospital.  [facility] is not responsible for any belongings or valuables.  Contacts, dentures or bridgework may not be worn into surgery.  Leave your suitcase in the car.  After surgery it may be brought to your room.  For patients admitted to the hospital, discharge time will be determined by your treatment team.  Patients discharged the day of surgery will not be allowed to drive home.   Name and phone number of your driver:   Family  Special instructions:  Follow the diet and prep instructions given to you by Dr Roseanne Kaufman office. Your last dose of Xarelto should be on 10/28/2016.  Please read over the following fact sheets that you were given. Anesthesia Post-op Instructions and Care and Recovery After Surgery       Esophagogastroduodenoscopy Esophagogastroduodenoscopy (EGD) is a procedure to examine the lining of the esophagus, stomach, and first part of the small intestine (duodenum). This procedure is done to check for problems such as inflammation, bleeding, ulcers, or growths. During this procedure, a long, flexible, lighted tube with a camera attached (endoscope) is inserted down the throat. Tell a health care provider about:  Any allergies you have.  All medicines you are taking, including vitamins, herbs, eye drops, creams, and over-the-counter  medicines.  Any problems you or family members have had with anesthetic medicines.  Any blood disorders you have.  Any surgeries you have had.  Any medical conditions you have.  Whether you are pregnant or may be pregnant. What are the risks? Generally, this is a safe procedure. However, problems may occur, including:  Infection.  Bleeding.  A tear (perforation) in the esophagus, stomach, or duodenum.  Trouble breathing.  Excessive sweating.  Spasms of the larynx.  A slowed heartbeat.  Low blood pressure.  What happens before the procedure?  Follow instructions from your health care provider about eating or drinking restrictions.  Ask your health care provider about: ? Changing or stopping your regular medicines. This is especially important if you are taking diabetes medicines or blood thinners. ? Taking medicines such as aspirin and ibuprofen. These medicines can thin your blood. Do not take these medicines before your procedure if your health care provider instructs you not to.  Plan to have someone take you home after the procedure.  If you wear dentures, be ready to remove them before the procedure. What happens during the procedure?  To reduce your risk of infection, your health care team will wash or sanitize their hands.  An IV tube will be put in a vein in your hand or arm. You will get medicines and fluids through this tube.  You will be given one or more of the following: ? A medicine to  help you relax (sedative). ? A medicine to numb the area (local anesthetic). This medicine may be sprayed into your throat. It will make you feel more comfortable and keep you from gagging or coughing during the procedure. ? A medicine for pain.  A mouth guard may be placed in your mouth to protect your teeth and to keep you from biting on the endoscope.  You will be asked to lie on your left side.  The endoscope will be lowered down your throat into your esophagus,  stomach, and duodenum.  Air will be put into the endoscope. This will help your health care provider see better.  The lining of your esophagus, stomach, and duodenum will be examined.  Your health care provider may: ? Take a tissue sample so it can be looked at in a lab (biopsy). ? Remove growths. ? Remove objects (foreign bodies) that are stuck. ? Treat any bleeding with medicines or other devices that stop tissue from bleeding. ? Widen (dilate) or stretch narrowed areas of your esophagus and stomach.  The endoscope will be taken out. The procedure may vary among health care providers and hospitals. What happens after the procedure?  Your blood pressure, heart rate, breathing rate, and blood oxygen level will be monitored often until the medicines you were given have worn off.  Do not eat or drink anything until the numbing medicine has worn off and your gag reflex has returned. This information is not intended to replace advice given to you by your health care provider. Make sure you discuss any questions you have with your health care provider. Document Released: 05/08/2004 Document Revised: 06/13/2015 Document Reviewed: 11/29/2014 Elsevier Interactive Patient Education  2018 [employer]. Esophagogastroduodenoscopy, Care After Refer to this sheet in the next few weeks. These instructions provide you with information about caring for yourself after your procedure. Your health care provider may also give you more specific instructions. Your treatment has been planned according to current medical practices, but problems sometimes occur. Call your health care provider if you have any problems or questions after your procedure. What can I expect after the procedure? After the procedure, it is common to have:  A sore throat.  Nausea.  Bloating.  Dizziness.  Fatigue.  Follow these instructions at home:  Do not eat or drink anything until the numbing medicine (local anesthetic)  has worn off and your gag reflex has returned. You will know that the local anesthetic has worn off when you can swallow comfortably.  Do not drive for 24 hours if you received a medicine to help you relax (sedative).  If your health care provider took a tissue sample for testing during the procedure, make sure to get your test results. This is your responsibility. Ask your health care provider or the department performing the test when your results will be ready.  Keep all follow-up visits as told by your health care provider. This is important. Contact a health care provider if:  You cannot stop coughing.  You are not urinating.  You are urinating less than usual. Get help right away if:  You have trouble swallowing.  You cannot eat or drink.  You have throat or chest pain that gets worse.  You are dizzy or light-headed.  You faint.  You have nausea or vomiting.  You have chills.  You have a fever.  You have severe abdominal pain.  You have black, tarry, or bloody stools. This information is not intended to replace advice given  to you by your health care provider. Make sure you discuss any questions you have with your health care provider. Document Released: 12/23/2011 Document Revised: 06/13/2015 Document Reviewed: 11/29/2014 Elsevier Interactive Patient Education  2018 [employer].  Colonoscopy, Adult A colonoscopy is an exam to look at the entire large intestine. During the exam, a lubricated, bendable tube is inserted into the anus and then passed into the rectum, colon, and other parts of the large intestine. A colonoscopy is often done as a part of normal colorectal screening or in response to certain symptoms, such as anemia, persistent diarrhea, abdominal pain, and blood in the stool. The exam can help screen for and diagnose medical problems, including:  Tumors.  Polyps.  Inflammation.  Areas of bleeding.  Tell a health care provider about:  Any  allergies you have.  All medicines you are taking, including vitamins, herbs, eye drops, creams, and over-the-counter medicines.  Any problems you or family members have had with anesthetic medicines.  Any blood disorders you have.  Any surgeries you have had.  Any medical conditions you have.  Any problems you have had passing stool. What are the risks? Generally, this is a safe procedure. However, problems may occur, including:  Bleeding.  A tear in the intestine.  A reaction to medicines given during the exam.  Infection (rare).  What happens before the procedure? Eating and drinking restrictions Follow instructions from your health care provider about eating and drinking, which may include:  A few days before the procedure - follow a low-fiber diet. Avoid nuts, seeds, dried fruit, raw fruits, and vegetables.  1-3 days before the procedure - follow a clear liquid diet. Drink only clear liquids, such as clear broth or bouillon, black coffee or tea, clear juice, clear soft drinks or sports drinks, gelatin dessert, and popsicles. Avoid any liquids that contain red or purple dye.  On the day of the procedure - do not eat or drink anything during the 2 hours before the procedure, or within the time period that your health care provider recommends.  Bowel prep If you were prescribed an oral bowel prep to clean out your colon:  Take it as told by your health care provider. Starting the day before your procedure, you will need to drink a large amount of medicated liquid. The liquid will cause you to have multiple loose stools until your stool is almost clear or light green.  If your skin or anus gets irritated from diarrhea, you may use these to relieve the irritation: ? Medicated wipes, such as adult wet wipes with aloe and vitamin E. ? A skin soothing-product like petroleum jelly.  If you vomit while drinking the bowel prep, take a break for up to 60 minutes and then begin the  bowel prep again. If vomiting continues and you cannot take the bowel prep without vomiting, call your health care provider.  General instructions  Ask your health care provider about changing or stopping your regular medicines. This is especially important if you are taking diabetes medicines or blood thinners.  Plan to have someone take you home from the hospital or clinic. What happens during the procedure?  An IV tube may be inserted into one of your veins.  You will be given medicine to help you relax (sedative).  To reduce your risk of infection: ? Your health care team will wash or sanitize their hands. ? Your anal area will be washed with soap.  You will be asked to lie  on your side with your knees bent.  Your health care provider will lubricate a long, thin, flexible tube. The tube will have a camera and a light on the end.  The tube will be inserted into your anus.  The tube will be gently eased through your rectum and colon.  Air will be delivered into your colon to keep it open. You may feel some pressure or cramping.  The camera will be used to take images during the procedure.  A small tissue sample may be removed from your body to be examined under a microscope (biopsy). If any potential problems are found, the tissue will be sent to a lab for testing.  If small polyps are found, your health care provider may remove them and have them checked for cancer cells.  The tube that was inserted into your anus will be slowly removed. The procedure may vary among health care providers and hospitals. What happens after the procedure?  Your blood pressure, heart rate, breathing rate, and blood oxygen level will be monitored until the medicines you were given have worn off.  Do not drive for 24 hours after the exam.  You may have a small amount of blood in your stool.  You may pass gas and have mild abdominal cramping or bloating due to the air that was used to inflate  your colon during the exam.  It is up to you to get the results of your procedure. Ask your health care provider, or the department performing the procedure, when your results will be ready. This information is not intended to replace advice given to you by your health care provider. Make sure you discuss any questions you have with your health care provider. Document Released: 01/03/2000 Document Revised: 11/06/2015 Document Reviewed: 03/19/2015 Elsevier Interactive Patient Education  2018 [employer].  Colonoscopy, Adult, Care After This sheet gives you information about how to care for yourself after your procedure. Your health care provider may also give you more specific instructions. If you have problems or questions, contact your health care provider. What can I expect after the procedure? After the procedure, it is common to have:  A small amount of blood in your stool for 24 hours after the procedure.  Some gas.  Mild abdominal cramping or bloating.  Follow these instructions at home: General instructions   For the first 24 hours after the procedure: ? Do not drive or use machinery. ? Do not sign important documents. ? Do not drink alcohol. ? Do your regular daily activities at a slower pace than normal. ? Eat soft, easy-to-digest foods. ? Rest often.  Take over-the-counter or prescription medicines only as told by your health care provider.  It is up to you to get the results of your procedure. Ask your health care provider, or the department performing the procedure, when your results will be ready. Relieving cramping and bloating  Try walking around when you have cramps or feel bloated.  Apply heat to your abdomen as told by your health care provider. Use a heat source that your health care provider recommends, such as a moist heat pack or a heating pad. ? Place a towel between your skin and the heat source. ? Leave the heat on for 20-30 minutes. ? Remove the  heat if your skin turns bright red. This is especially important if you are unable to feel pain, heat, or cold. You may have a greater risk of getting burned. Eating and drinking  Drink  enough fluid to keep your urine clear or pale yellow.  Resume your normal diet as instructed by your health care provider. Avoid heavy or fried foods that are hard to digest.  Avoid drinking alcohol for as long as instructed by your health care provider. Contact a health care provider if:  You have blood in your stool 2-3 days after the procedure. Get help right away if:  You have more than a small spotting of blood in your stool.  You pass large blood clots in your stool.  Your abdomen is swollen.  You have nausea or vomiting.  You have a fever.  You have increasing abdominal pain that is not relieved with medicine. This information is not intended to replace advice given to you by your health care provider. Make sure you discuss any questions you have with your health care provider. Document Released: 08/20/2003 Document Revised: 09/30/2015 Document Reviewed: 03/19/2015 Elsevier Interactive Patient Education  2018 [employer] Anesthesia is a term that refers to techniques, procedures, and medicines that help a person stay safe and comfortable during a medical procedure. Monitored anesthesia care, or sedation, is one type of anesthesia. Your anesthesia specialist may recommend sedation if you will be having a procedure that does not require you to be unconscious, such as:  Cataract surgery.  A dental procedure.  A biopsy.  A colonoscopy.  During the procedure, you may receive a medicine to help you relax (sedative). There are three levels of sedation:  Mild sedation. At this level, you may feel awake and relaxed. You will be able to follow directions.  Moderate sedation. At this level, you will be sleepy. You may not remember the procedure.  Deep sedation. At  this level, you will be asleep. You will not remember the procedure.  The more medicine you are given, the deeper your level of sedation will be. Depending on how you respond to the procedure, the anesthesia specialist may change your level of sedation or the type of anesthesia to fit your needs. An anesthesia specialist will monitor you closely during the procedure. Let your health care provider know about:  Any allergies you have.  All medicines you are taking, including vitamins, herbs, eye drops, creams, and over-the-counter medicines.  Any use of steroids (by mouth or as a cream).  Any problems you or family members have had with sedatives and anesthetic medicines.  Any blood disorders you have.  Any surgeries you have had.  Any medical conditions you have, such as sleep apnea.  Whether you are pregnant or may be pregnant.  Any use of cigarettes, alcohol, or street drugs. What are the risks? Generally, this is a safe procedure. However, problems may occur, including:  Getting too much medicine (oversedation).  Nausea.  Allergic reaction to medicines.  Trouble breathing. If this happens, a breathing tube may be used to help with breathing. It will be removed when you are awake and breathing on your own.  Heart trouble.  Lung trouble.  Before the procedure Staying hydrated Follow instructions from your health care provider about hydration, which may include:  Up to 2 hours before the procedure - you may continue to drink clear liquids, such as water, clear fruit juice, black coffee, and plain tea.  Eating and drinking restrictions Follow instructions from your health care provider about eating and drinking, which may include:  8 hours before the procedure - stop eating heavy meals or foods such as meat, fried foods, or fatty  foods.  6 hours before the procedure - stop eating light meals or foods, such as toast or cereal.  6 hours before the procedure - stop  drinking milk or drinks that contain milk.  2 hours before the procedure - stop drinking clear liquids.  Medicines Ask your health care provider about:  Changing or stopping your regular medicines. This is especially important if you are taking diabetes medicines or blood thinners.  Taking medicines such as aspirin and ibuprofen. These medicines can thin your blood. Do not take these medicines before your procedure if your health care provider instructs you not to.  Tests and exams  You will have a physical exam.  You may have blood tests done to show: ? How well your kidneys and liver are working. ? How well your blood can clot.  General instructions  Plan to have someone take you home from the hospital or clinic.  If you will be going home right after the procedure, plan to have someone with you for 24 hours.  What happens during the procedure?  Your blood pressure, heart rate, breathing, level of pain and overall condition will be monitored.  An IV tube will be inserted into one of your veins.  Your anesthesia specialist will give you medicines as needed to keep you comfortable during the procedure. This may mean changing the level of sedation.  The procedure will be performed. After the procedure  Your blood pressure, heart rate, breathing rate, and blood oxygen level will be monitored until the medicines you were given have worn off.  Do not drive for 24 hours if you received a sedative.  You may: ? Feel sleepy, clumsy, or nauseous. ? Feel forgetful about what happened after the procedure. ? Have a sore throat if you had a breathing tube during the procedure. ? Vomit. This information is not intended to replace advice given to you by your health care provider. Make sure you discuss any questions you have with your health care provider. Document Released: 10/01/2004 Document Revised: 06/14/2015 Document Reviewed: 04/28/2015 Elsevier Interactive Patient Education   2018 [place], Care After These instructions provide you with information about caring for yourself after your procedure. Your health care provider may also give you more specific instructions. Your treatment has been planned according to current medical practices, but problems sometimes occur. Call your health care provider if you have any problems or questions after your procedure. What can I expect after the procedure? After your procedure, it is common to:  Feel sleepy for several hours.  Feel clumsy and have poor balance for several hours.  Feel forgetful about what happened after the procedure.  Have poor judgment for several hours.  Feel nauseous or vomit.  Have a sore throat if you had a breathing tube during the procedure.  Follow these instructions at home: For at least 24 hours after the procedure:   Do not: ? Participate in activities in which you could fall or become injured. ? Drive. ? Use heavy machinery. ? Drink alcohol. ? Take sleeping pills or medicines that cause drowsiness. ? Make important decisions or sign legal documents. ? Take care of children on your own.  Rest. Eating and drinking  Follow the diet that is recommended by your health care provider.  If you vomit, drink water, juice, or soup when you can drink without vomiting.  Make sure you have little or no nausea before eating solid foods. General instructions  Have a responsible  adult stay with you until you are awake and alert.  Take over-the-counter and prescription medicines only as told by your health care provider.  If you smoke, do not smoke without supervision.  Keep all follow-up visits as told by your health care provider. This is important. Contact a health care provider if:  You keep feeling nauseous or you keep vomiting.  You feel light-headed.  You develop a rash.  You have a fever. Get help right away if:  You have trouble  breathing. This information is not intended to replace advice given to you by your health care provider. Make sure you discuss any questions you have with your health care provider. Document Released: 04/28/2015 Document Revised: 08/28/2015 Document Reviewed: 04/28/2015 Elsevier Interactive Patient Education  [employer].

## 2016-10-21 NOTE — Consult Note (Signed)
Chief Complaint: Patient was seen in consultation today for Port-A-Cath placement  Referring Physician(s): Feng,Yan  Supervising Physician: Sandi Mariscal  Patient Status: [place]  History of Present Illness: Kevin Dillon is a 66 y.o. male with history of cirrhosis,  hepatitis C, anemia and recurrent hepatocellular carcinoma with portal vein thrombosis (on xarelto), initially diagnosed in 2015, status post prior hepatic arterial chemoembolization in [place]. He has poor venous access and presents today for Port-A-Cath placement for immunotherapy.  Past Medical History:  Diagnosis Date  . Cancer ([place])    liver  . Cholelithiasis   . Chronic hepatitis C ([place])   . Chronic knee pain   . Chronic left shoulder pain   . CVA (cerebral infarction)   . Gout   . Hepatitis C    genotype 1b.  pt has been vaccinated against hep A and B.  . Hypertension   . Osteoarthritis   . Schatzki's ring   . Tubular adenoma of colon 12/2011    Past Surgical History:  Procedure Laterality Date  . AMPUTATION Left 09/17/2012   Procedure: SMALL FINGER EXTENSOR TENDON REPAIR; METACARPAL LEVEL AMPUTATION RING FINGER; PROXIMAL PHALANX LEVEL AMPUTATION LONG FINGER;  Surgeon: Schuyler Amor, MD;  Location: [facility];  Service: Orthopedics;  Laterality: Left;  . Arm surgery     right/plate in arm  . COLONOSCOPY WITH ESOPHAGOGASTRODUODENOSCOPY (EGD)  12/30/2011   RMR: Noncritical Schatzki's ring;  Hiatal hernia, Tubular ADENOMA removed from splenic flexure, otherwise normal colonoscopy  . LEG SURGERY     left  . WOUND EXPLORATION Left 09/17/2012   Procedure: WOUND EXPLORATION;  Surgeon: Schuyler Amor, MD;  Location: [facility];  Service: Orthopedics;  Laterality: Left;    Allergies: Patient has no known allergies.  Medications: Prior to Admission medications   Medication Sig Start Date End Date Taking? Authorizing Provider  ALPRAZolam Duanne Moron) 1 MG tablet Take 1 tablet (1 mg total) by mouth 3  (three) times daily as needed for anxiety. 10/08/16   Truitt Merle, MD  cyclobenzaprine (FLEXERIL) 5 MG tablet Take 1 tablet (5 mg total) by mouth 3 (three) times daily as needed for muscle spasms. 08/27/16   Truitt Merle, MD  furosemide (LASIX) 20 MG tablet Take 1 tablet by mouth daily. 08/10/16   [provider]  hydrALAZINE (APRESOLINE) 25 MG tablet Take 50 mg by mouth daily.  08/21/15   [provider]  lactulose (CHRONULAC) 10 GM/15ML solution 30 g daily as needed for moderate constipation.  01/06/16   [provider]  megestrol (MEGACE) 40 MG/ML suspension Take 400 mg by mouth daily.  10/07/15   [provider]  Multiple Vitamins-Minerals (CENTRUM SILVER PO) Take 1 tablet by mouth daily.     [provider]  oxyCODONE (OXYCONTIN) 80 mg 12 hr tablet Take 2 tablets (160 mg total) by mouth every 12 (twelve) hours. 10/08/16   Truitt Merle, MD  oxycodone (ROXICODONE) 30 MG immediate release tablet Take 1 tablet (30 mg total) by mouth every 4 (four) hours as needed for pain. 10/08/16   Truitt Merle, MD  PARoxetine (PAXIL) 20 MG tablet Take 20 mg by mouth every morning.  08/08/12   [provider]  polyethylene glycol-electrolytes (TRILYTE) 420 g solution Take 4,000 mLs by mouth as directed. 10/13/16   Rourk, Cristopher Estimable, MD  prochlorperazine (COMPAZINE) 10 MG tablet TAKE 1 TABLET BY MOUTH EVERY 8 HOURS AS NEEDED FOR NAUSEA/VOMITING 08/14/16   Truitt Merle, MD  rivaroxaban (XARELTO) 20 MG  TABS tablet Take 1 tablet (20 mg total) by mouth daily with supper. 10/08/16   Truitt Merle, MD     Family History  Problem Relation Age of Onset  . Cirrhosis Father 57  . Cancer Father        liver cancer   . Lung cancer Mother 3  . Colon cancer Neg Hx     Social History   Social History  . Marital status: Single    Spouse name: N/A  . Number of children: 0  . Years of education: N/A   Occupational History  . disabled    Social History Main Topics  . Smoking status: Former  Smoker    Packs/day: 0.50    Years: 40.00    Types: Cigarettes    Quit date: 01/19/2012  . Smokeless tobacco: Never Used     Comment: Smokes 1/2 pack of cigarettes daily  . Alcohol use No     Comment: HX 2 beers 3-4 days per week; QUIT DEC 2013  . Drug use: No     Comment: Hx cocaine yrs ago, Last marijuana OCT 2013  . Sexual activity: Not Currently    Partners: Female   Other Topics Concern  . Not on file   Social History Narrative   Lives w/ significant other, [name]      Review of Systems : he currently denies fever, headache,chest pain, worsening abdominal pain, nausea, vomiting or abnormal bleeding. He does have fatigue, intermittent back pain and occ cough  Vital Signs: BP (!) 155/79 (BP Location: Right Arm)   Pulse 87   Temp 98 F (36.7 C) (Oral)   Resp 18   SpO2 90%   Physical Exam awake, alert. Chest with diminished breath sounds left base, right clear. Heart with regular rate and rhythm. Abdomen soft, slightly distended, positive bowel sounds, currently nontender; no lower extremity edema.  Imaging: No results found.  Labs:  CBC:  Recent Labs  09/03/16 1252 10/08/16 1300 10/08/16 1401 10/16/16 1251  WBC 8.5 7.8 6.7 9.4  HGB 9.4* 7.3* 6.9* 10.2*  HCT 27.5* 22.7* 22.7* 32.4*  PLT 158 263 213 201    COAGS: No results for input(s): INR, APTT in the last 8760 hours.  BMP:  Recent Labs  01/25/16 1013  08/27/16 1409 09/03/16 1252 10/08/16 1300 10/16/16 1251  NA 135  < > 140 138 137 139  K 4.2  < > 4.1 3.6 4.1 3.7  CL 101  --   --   --   --   --   CO2 25  < > 25 22 24 23   GLUCOSE 88  < > 110 137 87 144*  BUN 13  < > 18.9 23.4 10.0 13.2  CALCIUM 9.0  < > 9.5 9.5 9.2 9.6  CREATININE 1.08  < > 1.5* 1.5* 1.5* 1.5*  GFRNONAA >60  --   --   --   --   --   GFRAA >60  --   --   --   --   --   < > = values in this interval not displayed.  LIVER FUNCTION TESTS:  Recent Labs  08/27/16 1409 09/03/16 1252 10/08/16 1300 10/16/16 1251    BILITOT 0.55 0.49 0.36 0.37  AST 29 25 24 23   ALT 23 17 9 12   ALKPHOS 92 100 106 105  PROT 7.8 7.5 7.5 8.2  ALBUMIN 3.8 3.5 3.7 4.1    TUMOR MARKERS: No results for input(s): AFPTM, CEA, CA199,  [place] in the last 8760 hours.  Assessment and Plan: 66 y.o. male with history of cirrhosis,  hepatitis C, anemia and recurrent hepatocellular carcinoma with portal vein thrombosis (on xarelto), initially diagnosed in 2015, status post prior hepatic arterial chemoembolization in [place]. He has poor venous access and presents today for Port-A-Cath placement for immunotherapy.Risks and benefits discussed with the patient/sig other including, but not limited to bleeding, infection, pneumothorax, or fibrin sheath development and need for additional procedures.All of the patient's questions were answered, patient is agreeable to proceed.Consent signed and in chart. Labs pend.      Thank you for this interesting consult.  I greatly enjoyed meeting Kevin Dillon and look forward to participating in their care.  A copy of this report was sent to the requesting provider on this date.  Electronically Signed: D. Rowe Robert, PA-C 10/21/2016, 12:58 PM   I spent a total of  25 minutes   in face to face in clinical consultation, greater than 50% of which was counseling/coordinating care for Port-A-Cath placement

## 2016-10-21 NOTE — Discharge Instructions (Signed)
Implanted Port Insertion, Care After This sheet gives you information about how to care for yourself after your procedure. Your health care provider may also give you more specific instructions. If you have problems or questions, contact your health care provider. What can I expect after the procedure? After your procedure, it is common to have:  Discomfort at the port insertion site.  Bruising on the skin over the port. This should improve over 3-4 days.  Follow these instructions at home: [facility] care  After your port is placed, you will get a manufacturer's information card. The card has information about your port. Keep this card with you at all times.  Take care of the port as told by your health care provider. Ask your health care provider if you or a family member can get training for taking care of the port at home. A home health care nurse may also take care of the port.  Make sure to remember what type of port you have. Incision care  Follow instructions from your health care provider about how to take care of your port insertion site. Make sure you: ? Wash your hands with soap and water before you change your bandage (dressing). If soap and water are not available, use hand sanitizer. ? Change your dressing as told by your health care provider.  Please remove your dressing tomorrow 10/22/16. ? Leave skin glue in place. These skin closures may need to stay in place for 2 weeks or longer. Do not remove adhesive strips completely unless your health care provider tells you to do that.  DO NOT use EMLA cream for 2 weeks after port placement as this cream will remove surgical glue.  Check your port insertion site every day for signs of infection. Check for: ? More redness, swelling, or pain. ? More fluid or blood. ? Warmth. ? Pus or a bad smell. General instructions  Do not take baths, swim, or use a hot tub until your health care provider approves.    You may shower tomorrow  10/22/16.  Do not lift anything that is heavier than 10 lb (4.5 kg) for a week, or as told by your health care provider.  Ask your health care provider when it is okay to: ? Return to work or school. ? Resume usual physical activities or sports.  Do not drive for 24 hours if you were given a medicine to help you relax (sedative).  Take over-the-counter and prescription medicines only as told by your health care provider.  Wear a medical alert bracelet in case of an emergency. This will tell any health care providers that you have a port.  Keep all follow-up visits as told by your health care provider. This is important. Contact a health care provider if:  You have a fever or chills.  You have more redness, swelling, or pain around your port insertion site.  You have more fluid or blood coming from your port insertion site.  Your port insertion site feels warm to the touch.  You have pus or a bad smell coming from the port insertion site. Get help right away if:  You have chest pain or shortness of breath.  You have bleeding from your port that you cannot control. Summary  Take care of the port as told by your health care provider.  Change your dressing as told by your health care provider.  Keep all follow-up visits as told by your health care provider. This information is not intended  to replace advice given to you by your health care provider. Make sure you discuss any questions you have with your health care provider. Document Released: 10/26/2012 Document Revised: 11/27/2015 Document Reviewed: 11/27/2015 Elsevier Interactive Patient Education  2017 [employer]. Moderate Conscious Sedation, Adult, Care After These instructions provide you with information about caring for yourself after your procedure. Your health care provider may also give you more specific instructions. Your treatment has been planned according to current medical practices, but problems sometimes  occur. Call your health care provider if you have any problems or questions after your procedure. What can I expect after the procedure? After your procedure, it is common:  To feel sleepy for several hours.  To feel clumsy and have poor balance for several hours.  To have poor judgment for several hours.  To vomit if you eat too soon.  Follow these instructions at home: For at least 24 hours after the procedure:   Do not: ? Participate in activities where you could fall or become injured. ? Drive. ? Use heavy machinery. ? Drink alcohol. ? Take sleeping pills or medicines that cause drowsiness. ? Make important decisions or sign legal documents. ? Take care of children on your own.  Rest. Eating and drinking  Follow the diet recommended by your health care provider.  If you vomit: ? Drink water, juice, or soup when you can drink without vomiting. ? Make sure you have little or no nausea before eating solid foods. General instructions  Have a responsible adult stay with you until you are awake and alert.  Take over-the-counter and prescription medicines only as told by your health care provider.  If you smoke, do not smoke without supervision.  Keep all follow-up visits as told by your health care provider. This is important. Contact a health care provider if:  You keep feeling nauseous or you keep vomiting.  You feel light-headed.  You develop a rash.  You have a fever. Get help right away if:  You have trouble breathing. This information is not intended to replace advice given to you by your health care provider. Make sure you discuss any questions you have with your health care provider. Document Released: 10/26/2012 Document Revised: 06/10/2015 Document Reviewed: 04/27/2015 Elsevier Interactive Patient Education  [employer].

## 2016-10-22 ENCOUNTER — Other Ambulatory Visit: Payer: Self-pay | Admitting: *Deleted

## 2016-10-22 DIAGNOSIS — C220 Liver cell carcinoma: Secondary | ICD-10-CM

## 2016-10-22 MED ORDER — CYCLOBENZAPRINE HCL 5 MG PO TABS: 5.0000 mg | ORAL_TABLET | Freq: Three times a day (TID) | ORAL | 1 refills | Status: DC | PRN

## 2016-10-23 ENCOUNTER — Ambulatory Visit: Payer: Medicare Other

## 2016-10-26 ENCOUNTER — Telehealth: Payer: Self-pay | Admitting: Hematology

## 2016-10-26 ENCOUNTER — Other Ambulatory Visit: Payer: Self-pay | Admitting: *Deleted

## 2016-10-26 DIAGNOSIS — C220 Liver cell carcinoma: Secondary | ICD-10-CM

## 2016-10-26 MED ORDER — LIDOCAINE-PRILOCAINE 2.5-2.5 % EX CREA: 1.0000 "application " | TOPICAL_CREAM | CUTANEOUS | 1 refills | Status: AC | PRN

## 2016-10-26 MED FILL — LIDOCAINE-PRILOCAINE CREAM: 2.5-2.5 | 10 days supply | Qty: 30 | Fill #0

## 2016-10-26 NOTE — Telephone Encounter (Signed)
10/22/16 prescription refill.  Prescription scanned in media .

## 2016-10-27 ENCOUNTER — Encounter (HOSPITAL_COMMUNITY): Payer: Self-pay

## 2016-10-27 ENCOUNTER — Encounter (HOSPITAL_COMMUNITY)
Admission: RE | Admit: 2016-10-27 | Discharge: 2016-10-27 | Disposition: A | Discharge status: Home / Self Care | Payer: Medicare Other | Source: Ambulatory Visit | Attending: Internal Medicine | Admitting: Internal Medicine

## 2016-10-27 DIAGNOSIS — Z01810 Encounter for preprocedural cardiovascular examination: Secondary | ICD-10-CM | POA: Diagnosis not present

## 2016-10-28 ENCOUNTER — Telehealth: Payer: Self-pay | Admitting: *Deleted

## 2016-10-28 ENCOUNTER — Encounter: Payer: Self-pay | Admitting: Hematology

## 2016-10-28 ENCOUNTER — Other Ambulatory Visit (HOSPITAL_COMMUNITY): Payer: Medicare Other

## 2016-10-28 NOTE — Progress Notes (Signed)
Submitted auth request for Cyclobenzaprine today.  Status is pending.

## 2016-10-28 NOTE — Telephone Encounter (Signed)
Difficult call.  this nurse spelled for her asking what script reads for this medicine.     "[employer] calling for Dr. Burr Medico in reference to the Cyclobenzaprine prior authorization request.  Return call [phone] for clarification.  Anyone in the office can help.  Need to know the indication for this order.    Has the patient had an injury?    Does patient have a musculoskeletal condition or is this acute?    Is Cyclobenzaprine for short term use?    Any painful musculoskeletal or chronic musculoskeletal conditions?" Routing call information to Financial advocate/Drug replacement specialist, collaborative nurse and provider for review and further [place] communication.

## 2016-10-29 ENCOUNTER — Other Ambulatory Visit: Payer: Self-pay | Admitting: Hematology

## 2016-10-29 ENCOUNTER — Other Ambulatory Visit (HOSPITAL_BASED_OUTPATIENT_CLINIC_OR_DEPARTMENT_OTHER): Payer: Medicare Other

## 2016-10-29 ENCOUNTER — Ambulatory Visit: Payer: Medicare Other | Admitting: Hematology

## 2016-10-29 ENCOUNTER — Ambulatory Visit (HOSPITAL_BASED_OUTPATIENT_CLINIC_OR_DEPARTMENT_OTHER): Payer: Medicare Other

## 2016-10-29 VITALS — BP 170/78 | HR 77 | Temp 97.7°F | Resp 18

## 2016-10-29 DIAGNOSIS — D500 Iron deficiency anemia secondary to blood loss (chronic): Secondary | ICD-10-CM | POA: Diagnosis not present

## 2016-10-29 DIAGNOSIS — T451X5A Adverse effect of antineoplastic and immunosuppressive drugs, initial encounter: Secondary | ICD-10-CM

## 2016-10-29 DIAGNOSIS — C220 Liver cell carcinoma: Secondary | ICD-10-CM

## 2016-10-29 DIAGNOSIS — D6481 Anemia due to antineoplastic chemotherapy: Secondary | ICD-10-CM

## 2016-10-29 LAB — CBC WITH DIFFERENTIAL/PLATELET
BASO%: 0.2 % (ref 0.0–2.0)
Basophils Absolute: 0 10*3/uL (ref 0.0–0.1)
EOS%: 2.8 % (ref 0.0–7.0)
Eosinophils Absolute: 0.2 10*3/uL (ref 0.0–0.5)
HCT: 32.3 % — ABNORMAL LOW (ref 38.4–49.9)
HGB: 10 g/dL — ABNORMAL LOW (ref 13.0–17.1)
LYMPH%: 32.7 % (ref 14.0–49.0)
MCH: 24.9 pg — ABNORMAL LOW (ref 27.2–33.4)
MCHC: 31 g/dL — ABNORMAL LOW (ref 32.0–36.0)
MCV: 80.5 fL (ref 79.3–98.0)
MONO#: 0.7 10*3/uL (ref 0.1–0.9)
MONO%: 8.3 % (ref 0.0–14.0)
NEUT%: 56 % (ref 39.0–75.0)
NEUTROS ABS: 4.7 10*3/uL (ref 1.5–6.5)
PLATELETS: 167 10*3/uL (ref 140–400)
RBC: 4.01 10*6/uL — AB (ref 4.20–5.82)
RDW: 18.1 % — ABNORMAL HIGH (ref 11.0–14.6)
WBC: 8.4 10*3/uL (ref 4.0–10.3)
lymph#: 2.7 10*3/uL (ref 0.9–3.3)

## 2016-10-29 LAB — COMPREHENSIVE METABOLIC PANEL
ALT: 13 U/L (ref 0–55)
AST: 24 U/L (ref 5–34)
Albumin: 4.1 g/dL (ref 3.5–5.0)
Alkaline Phosphatase: 97 U/L (ref 40–150)
Anion Gap: 12 mEq/L — ABNORMAL HIGH (ref 3–11)
BILIRUBIN TOTAL: 0.29 mg/dL (ref 0.20–1.20)
BUN: 18.3 mg/dL (ref 7.0–26.0)
CO2: 22 meq/L (ref 22–29)
CREATININE: 1.7 mg/dL — AB (ref 0.7–1.3)
Calcium: 9.5 mg/dL (ref 8.4–10.4)
Chloride: 106 mEq/L (ref 98–109)
EGFR: 42 mL/min/{1.73_m2} — ABNORMAL LOW (ref >60)
GLUCOSE: 120 mg/dL (ref 70–140)
Potassium: 3.9 mEq/L (ref 3.5–5.1)
SODIUM: 140 meq/L (ref 136–145)
TOTAL PROTEIN: 8.1 g/dL (ref 6.4–8.3)

## 2016-10-29 MED ORDER — SODIUM CHLORIDE 0.9% FLUSH
10.0000 mL | INTRAVENOUS | Status: DC | PRN
  Administered 2016-10-29: 10 mL
  Filled 2016-10-29: qty 10

## 2016-10-29 MED ORDER — SODIUM CHLORIDE 0.9 % IV SOLN
510.0000 mg | Freq: Once | INTRAVENOUS | Status: AC
  Administered 2016-10-29: 510 mg via INTRAVENOUS
  Filled 2016-10-29: qty 17

## 2016-10-29 MED ORDER — HEPARIN SOD (PORK) LOCK FLUSH 100 UNIT/ML IV SOLN
250.0000 [IU] | Freq: Once | INTRAVENOUS | Status: AC | PRN
  Administered 2016-10-29: 500 [IU]
  Filled 2016-10-29: qty 5

## 2016-10-29 MED ORDER — SODIUM CHLORIDE 0.9 % IV SOLN
Freq: Once | INTRAVENOUS | Status: AC
  Administered 2016-10-29: 14:00:00 via INTRAVENOUS

## 2016-10-29 MED FILL — GAVILYTE-N SOLUTION: 420 | 1 days supply | Qty: 4000 | Fill #0

## 2016-10-29 NOTE — Patient Instructions (Addendum)

## 2016-10-29 NOTE — Telephone Encounter (Signed)
"  [employer] calling about Cyclobenzaprine prior authorization.  Need to verify this is being used to treat skeletal muscle spasm associated with a painful muscular skeletal condition."     Advised to expect return call when response received from provider.

## 2016-10-29 NOTE — Telephone Encounter (Signed)
Ye, please call back and confirm that it's being used to treat skeletal muscular spasms associated with a painful musculoskeletal condition.  Truitt Merle MD

## 2016-10-29 NOTE — Telephone Encounter (Signed)
"  This is Kevin Dillon with [employer] calling about prior authorization for this patient."    Call transferred to collaborative ext [phone] for clinical assistance  For Prior authorization.

## 2016-10-29 NOTE — Progress Notes (Signed)
Pt tolerated the ferahem infusion with no problem, monitor pt for 30 min post infusion vital signs stable upon d/c.

## 2016-10-30 NOTE — Telephone Encounter (Signed)
Filiberto Pinks with Provider information for prior authorization.  Per Langley Gauss, a diagnosis code related to the muscular skeletal condition, was there an injury, is this Chronic, is this brought on by normal activity.   No injury.  [employer] request return call with this Information.  Provided ICD-10: C22.0 and G89.3 were not accepted for this authorization.  Called patient.  "No injury.  I can do my normal day to day activities (and ADL's).  The normal activities of maintaining the home are causing pain.  When standing to do things, bringing the trash cans in and out for city pick up, raking the yard, I have pain across the middle of my back with spasms and I have to stop what I'm doing, sit down for it to go away.  I've not had any spinal xrays, I just mentioned this to Dr.Feng so she ordered the flexeril."   Routing to APP.  Dr. Burr Medico to return 11-10-2016.  No worrisome lytic lesions or osseous lesions per March 2018 CT C/A/P.

## 2016-11-02 ENCOUNTER — Ambulatory Visit (HOSPITAL_COMMUNITY): Payer: Medicare Other | Admitting: Anesthesiology

## 2016-11-02 ENCOUNTER — Encounter (HOSPITAL_COMMUNITY): Payer: Self-pay

## 2016-11-02 ENCOUNTER — Ambulatory Visit (HOSPITAL_COMMUNITY)
Admission: RE | Admit: 2016-11-02 | Discharge: 2016-11-02 | Disposition: A | Discharge status: Home / Self Care | Payer: Medicare Other | Source: Ambulatory Visit | Attending: Internal Medicine | Admitting: Internal Medicine

## 2016-11-02 ENCOUNTER — Encounter: Payer: Self-pay | Admitting: Hematology

## 2016-11-02 ENCOUNTER — Encounter (HOSPITAL_COMMUNITY)
Admission: RE | Disposition: A | Discharge status: Home / Self Care | Payer: Self-pay | Source: Ambulatory Visit | Attending: Internal Medicine

## 2016-11-02 DIAGNOSIS — R710 Precipitous drop in hematocrit: Secondary | ICD-10-CM | POA: Insufficient documentation

## 2016-11-02 DIAGNOSIS — K3189 Other diseases of stomach and duodenum: Secondary | ICD-10-CM | POA: Diagnosis not present

## 2016-11-02 DIAGNOSIS — K449 Diaphragmatic hernia without obstruction or gangrene: Secondary | ICD-10-CM | POA: Diagnosis not present

## 2016-11-02 DIAGNOSIS — K621 Rectal polyp: Secondary | ICD-10-CM | POA: Insufficient documentation

## 2016-11-02 DIAGNOSIS — C801 Malignant (primary) neoplasm, unspecified: Secondary | ICD-10-CM | POA: Insufficient documentation

## 2016-11-02 DIAGNOSIS — K222 Esophageal obstruction: Secondary | ICD-10-CM | POA: Diagnosis not present

## 2016-11-02 DIAGNOSIS — Z87891 Personal history of nicotine dependence: Secondary | ICD-10-CM | POA: Diagnosis not present

## 2016-11-02 DIAGNOSIS — D120 Benign neoplasm of cecum: Secondary | ICD-10-CM | POA: Insufficient documentation

## 2016-11-02 DIAGNOSIS — K210 Gastro-esophageal reflux disease with esophagitis: Secondary | ICD-10-CM | POA: Insufficient documentation

## 2016-11-02 DIAGNOSIS — K2950 Unspecified chronic gastritis without bleeding: Secondary | ICD-10-CM | POA: Insufficient documentation

## 2016-11-02 DIAGNOSIS — I10 Essential (primary) hypertension: Secondary | ICD-10-CM | POA: Insufficient documentation

## 2016-11-02 DIAGNOSIS — C787 Secondary malignant neoplasm of liver and intrahepatic bile duct: Secondary | ICD-10-CM | POA: Insufficient documentation

## 2016-11-02 DIAGNOSIS — D122 Benign neoplasm of ascending colon: Secondary | ICD-10-CM | POA: Diagnosis not present

## 2016-11-02 DIAGNOSIS — Z79899 Other long term (current) drug therapy: Secondary | ICD-10-CM | POA: Diagnosis not present

## 2016-11-02 DIAGNOSIS — Z89022 Acquired absence of left finger(s): Secondary | ICD-10-CM | POA: Diagnosis not present

## 2016-11-02 DIAGNOSIS — Z1211 Encounter for screening for malignant neoplasm of colon: Secondary | ICD-10-CM | POA: Insufficient documentation

## 2016-11-02 DIAGNOSIS — K7460 Unspecified cirrhosis of liver: Secondary | ICD-10-CM | POA: Insufficient documentation

## 2016-11-02 DIAGNOSIS — K635 Polyp of colon: Secondary | ICD-10-CM | POA: Diagnosis not present

## 2016-11-02 DIAGNOSIS — Z86010 Personal history of colonic polyps: Secondary | ICD-10-CM | POA: Diagnosis not present

## 2016-11-02 DIAGNOSIS — Z8673 Personal history of transient ischemic attack (TIA), and cerebral infarction without residual deficits: Secondary | ICD-10-CM | POA: Insufficient documentation

## 2016-11-02 DIAGNOSIS — K5730 Diverticulosis of large intestine without perforation or abscess without bleeding: Secondary | ICD-10-CM | POA: Diagnosis not present

## 2016-11-02 DIAGNOSIS — Z79818 Long term (current) use of other agents affecting estrogen receptors and estrogen levels: Secondary | ICD-10-CM | POA: Insufficient documentation

## 2016-11-02 DIAGNOSIS — K766 Portal hypertension: Secondary | ICD-10-CM | POA: Insufficient documentation

## 2016-11-02 DIAGNOSIS — Z8719 Personal history of other diseases of the digestive system: Secondary | ICD-10-CM

## 2016-11-02 DIAGNOSIS — D649 Anemia, unspecified: Secondary | ICD-10-CM | POA: Diagnosis not present

## 2016-11-02 HISTORY — PX: ESOPHAGOGASTRODUODENOSCOPY (EGD) WITH PROPOFOL: SHX5813

## 2016-11-02 HISTORY — PX: POLYPECTOMY: SHX5525

## 2016-11-02 HISTORY — PX: COLONOSCOPY WITH PROPOFOL: SHX5780

## 2016-11-02 HISTORY — PX: BIOPSY: SHX5522

## 2016-11-02 SURGERY — COLONOSCOPY WITH PROPOFOL
Anesthesia: Monitor Anesthesia Care

## 2016-11-02 MED ORDER — CHLORHEXIDINE GLUCONATE CLOTH 2 % EX PADS: 6.0000 | MEDICATED_PAD | Freq: Once | CUTANEOUS | Status: DC

## 2016-11-02 MED ORDER — FENTANYL CITRATE (PF) 100 MCG/2ML IJ SOLN
INTRAMUSCULAR | Status: AC
  Filled 2016-11-02: qty 2

## 2016-11-02 MED ORDER — LACTATED RINGERS IV SOLN
INTRAVENOUS | Status: DC
  Administered 2016-11-02: 10:00:00 via INTRAVENOUS

## 2016-11-02 MED ORDER — MIDAZOLAM HCL 2 MG/2ML IJ SOLN
1.0000 mg | INTRAMUSCULAR | Status: AC
  Administered 2016-11-02: 2 mg via INTRAVENOUS

## 2016-11-02 MED ORDER — FENTANYL CITRATE (PF) 100 MCG/2ML IJ SOLN
25.0000 ug | Freq: Once | INTRAMUSCULAR | Status: AC
  Administered 2016-11-02: 25 ug via INTRAVENOUS

## 2016-11-02 MED ORDER — PROPOFOL 500 MG/50ML IV EMUL
INTRAVENOUS | Status: DC | PRN
  Administered 2016-11-02: 150 ug/kg/min via INTRAVENOUS
  Administered 2016-11-02: 11:00:00 via INTRAVENOUS

## 2016-11-02 MED ORDER — PROPOFOL 10 MG/ML IV BOLUS
INTRAVENOUS | Status: DC | PRN
  Administered 2016-11-02 (×2): 10 mg via INTRAVENOUS

## 2016-11-02 MED ORDER — LIDOCAINE VISCOUS 2 % MT SOLN
OROMUCOSAL | Status: AC
  Filled 2016-11-02: qty 15

## 2016-11-02 MED ORDER — MIDAZOLAM HCL 2 MG/2ML IJ SOLN
INTRAMUSCULAR | Status: AC
  Filled 2016-11-02: qty 2

## 2016-11-02 MED ORDER — LIDOCAINE VISCOUS 2 % MT SOLN
15.0000 mL | Freq: Once | OROMUCOSAL | Status: AC
  Administered 2016-11-02: 15 mL via OROMUCOSAL

## 2016-11-02 NOTE — Progress Notes (Signed)
Pt's Cyclobenzaprine was denied.  Gave denial to the nurse.

## 2016-11-02 NOTE — Op Note (Signed)
[facility] Patient Name: Kevin Dillon Procedure Date: 11/02/2016 9:55 AM MRN: [id number] Date of Birth: 1950/11/27 Attending MD: Norvel Richards , MD CSN: [id number] Age: 66 Admit Type: Outpatient Procedure:                Upper GI endoscopy Indications:              Suspected upper gastrointestinal bleeding Providers:                Norvel Richards, MD, Jeanann Lewandowsky. Sharon Seller, RN,                            Janeece Riggers, RN Referring MD:             Antonietta Jewel Medicines:                Propofol per Anesthesia Complications:            No immediate complications. Estimated Blood Loss:     Estimated blood loss was minimal. Procedure:                Pre-Anesthesia Assessment:                           - Prior to the procedure, a History and Physical                            was performed, and patient medications and                            allergies were reviewed. The patient's tolerance of                            previous anesthesia was also reviewed. The risks                            and benefits of the procedure and the sedation                            options and risks were discussed with the patient.                            All questions were answered, and informed consent                            was obtained. Prior Anticoagulants: The patient                            last took Eliquis (apixaban) 4 days prior to the                            procedure. ASA Grade Assessment: III - A patient                            with severe systemic disease. After reviewing the  risks and benefits, the patient was deemed in                            satisfactory condition to undergo the procedure.                           After obtaining informed consent, the endoscope was                            passed under direct vision. Throughout the                            procedure, the patient's blood pressure, pulse, and                 oxygen saturations were monitored continuously. The                            EG-[id number] ([id number]) scope was introduced through the                            and advanced to the second part of duodenum. The                            upper GI endoscopy was accomplished without                            difficulty. The patient tolerated the procedure                            well. The upper GI endoscopy was accomplished                            without difficulty. The patient tolerated the                            procedure well. Scope In: 10:35:43 AM Scope Out: 10:40:27 AM Total Procedure Duration: 0 hours 4 minutes 44 seconds  Findings:      mildly obstructing Schatzki ring (acquired) was found at the       gastroesophageal junction. istal esophageal erosions present within 5 mm       of the GE junction. No Barrett's epithelium. No esophageal varices. Ring       partially dilated with passage of the scope      A small hiatal hernia was present. This was biopsied with a cold forceps       for histology.      Portal hypertensive gastropathy was found in the stomach.      The duodenal bulb and second portion of the duodenum were normal. This       was biopsied with a cold forceps for histology. Impression:               - mildly obstructing Schatzki ring. - dilated with                            scope passage. erosive reflux esophagitis                           -  Small hiatal hernia.                           - Portal hypertensive gastropathy. biopsied                           - Normal duodenal bulb and second portion of the                            duodenum. Biopsied. Moderate Sedation:      Moderate (conscious) sedation was personally administered by an       anesthesia professional. The following parameters were monitored: oxygen       saturation, heart rate, blood pressure, respiratory rate, EKG, adequacy       of pulmonary ventilation, and response to care.  Total physician       intraservice time was 15 minutes. Recommendation:           - Patient has a contact number available for                            emergencies. The signs and symptoms of potential                            delayed complications were discussed with the                            patient. Return to normal activities tomorrow.                            Written discharge instructions were provided to the                            patient.                           - Advance diet as tolerated.                           - Continue present medications.                           - Await pathology results. Begin Protonix 40 mg                            daily                           - Repeat upper endoscopy in 2 years for screening                            purposes.                           - Return to GI clinic in 3 months. see colonoscopy                            report.  Procedure Code(s):        --- Professional ---                           [phone], Esophagogastroduodenoscopy, flexible,                            transoral; with biopsy, single or multiple Diagnosis Code(s):        --- Professional ---                           K22.2, Esophageal obstruction                           K44.9, Diaphragmatic hernia without obstruction or                            gangrene                           K76.6, Portal hypertension                           K31.89, Other diseases of stomach and duodenum CPT copyright 2016 American Medical Association. All rights reserved. The codes documented in this report are preliminary and upon coder review may  be revised to meet current compliance requirements. Cristopher Estimable. Rourk, MD Norvel Richards, MD 11/02/2016 11:27:43 AM This report has been signed electronically. Number of Addenda: 0

## 2016-11-02 NOTE — Anesthesia Preprocedure Evaluation (Deleted)
Anesthesia Evaluation  Patient identified by MRN, date of birth, ID band Patient awake    Reviewed: Allergy & Precautions, NPO status , Patient's Chart, lab work & pertinent test results  History of Anesthesia Complications Negative for: history of anesthetic complications  Airway        Dental   Pulmonary Current Smoker,           Cardiovascular hypertension, Pt. on medications      Neuro/Psych No residual CVA    GI/Hepatic   Endo/Other    Renal/GU      Musculoskeletal   Abdominal   Peds  Hematology   Anesthesia Other Findings   Reproductive/Obstetrics                             Anesthesia Physical Anesthesia Plan Anesthesia Quick Evaluation

## 2016-11-02 NOTE — H&P (Signed)
@LOGO @   Primary Care Physician:  Antonietta Jewel, MD Primary Gastroenterologist:  Dr. [name]  Pre-Procedure History & Physical: HPI:  Kevin Dillon is a 66 y.o. male here for EGD and colonoscopy. Recent acute drop in hemoglobin without overt or occult GI bleed documented.  Recent drop in heoglobin to 6.9 range requiring transfusion. No esophageal varices seen on 2013 EGD. History of colonic adenoma.  Hemoglobin is 10.0.  HCV cirrhosis status post HCV eradication.  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 Cancer ([place])    liver  . Cholelithiasis   . Chronic hepatitis C ([place])   . Chronic knee pain   . Chronic left shoulder pain   . CVA (cerebral infarction)   . Gout   . Hepatitis C    genotype 1b.  pt has been vaccinated against hep A and B.  . Hypertension   . Osteoarthritis   . Schatzki's ring   . Tubular adenoma of colon 12/2011    Past Surgical History:  Procedure Laterality Date  . AMPUTATION Left 09/17/2012   Procedure: SMALL FINGER EXTENSOR TENDON REPAIR; METACARPAL LEVEL AMPUTATION RING FINGER; PROXIMAL PHALANX LEVEL AMPUTATION LONG FINGER;  Surgeon: Schuyler Amor, MD;  Location: [facility];  Service: Orthopedics;  Laterality: Left;  . Arm surgery     right/plate in arm  . COLONOSCOPY WITH ESOPHAGOGASTRODUODENOSCOPY (EGD)  12/30/2011   RMR: Noncritical Schatzki's ring;  Hiatal hernia, Tubular ADENOMA removed from splenic flexure, otherwise normal colonoscopy  . IR FLUORO GUIDE PORT INSERTION RIGHT  10/21/2016  . IR US GUIDE VASC ACCESS RIGHT  10/21/2016  . LEG SURGERY     left  . WOUND EXPLORATION Left 09/17/2012   Procedure: WOUND EXPLORATION;  Surgeon: Schuyler Amor, MD;  Location: [facility];  Service: Orthopedics;  Laterality: Left;    Prior to Admission medications   Medication Sig Start Date End Date Taking? Authorizing Provider  ALPRAZolam Duanne Moron) 1 MG tablet Take 1 tablet (1 mg total) by mouth 3 (three)  times daily as needed for anxiety. 10/08/16  Yes Truitt Merle, MD  cyclobenzaprine (FLEXERIL) 5 MG tablet Take 1 tablet (5 mg total) by mouth 3 (three) times daily as needed for muscle spasms. 10/22/16  Yes Truitt Merle, MD  furosemide (LASIX) 20 MG tablet Take 20 mg by mouth daily.  08/10/16  Yes [provider]  hydrALAZINE (APRESOLINE) 25 MG tablet Take 75 mg by mouth daily.  08/21/15  Yes [provider]  lactulose (CHRONULAC) 10 GM/15ML solution Take 30 g by mouth daily as needed for moderate constipation.  01/06/16  Yes [provider]  megestrol (MEGACE) 40 MG/ML suspension Take 400 mg by mouth daily.  10/07/15  Yes [provider]  Multiple Vitamins-Minerals (CENTRUM SILVER PO) Take 1 tablet by mouth daily.    Yes [provider]  oxyCODONE (OXYCONTIN) 80 mg 12 hr tablet Take 2 tablets (160 mg total) by mouth every 12 (twelve) hours. 10/08/16  Yes Truitt Merle, MD  oxycodone (ROXICODONE) 30 MG immediate release tablet Take 1 tablet (30 mg total) by mouth every 4 (four) hours as needed for pain. 10/08/16  Yes Truitt Merle, MD  polyethylene glycol-electrolytes (TRILYTE) 420 g solution Take 4,000 mLs by mouth as directed. 10/13/16  Yes Ahmira Boisselle, Cristopher Estimable, MD  prochlorperazine (COMPAZINE) 10 MG tablet TAKE 1 TABLET BY MOUTH EVERY 8 HOURS AS NEEDED FOR NAUSEA/VOMITING Patient taking differently: TAKE 10 MG BY MOUTH EVERY 8 HOURS AS NEEDED  FOR NAUSEA/VOMITING 08/14/16  Yes Truitt Merle, MD  rivaroxaban (XARELTO) 20 MG TABS tablet Take 1 tablet (20 mg total) by mouth daily with supper. 10/08/16  Yes Truitt Merle, MD  lidocaine-prilocaine (EMLA) cream Apply 1 application topically as needed. Apply to portacath 1 1/2 hours - 2 hours prior to procedures as needed. 10/26/16   Truitt Merle, MD    Allergies as of 10/13/2016  . (No Known Allergies)    Family History  Problem Relation Age of Onset  . Cirrhosis Father 63  . Cancer Father        liver cancer   . Lung cancer Mother 70  .  Colon cancer Neg Hx     Social History   Social History  . Marital status: Single    Spouse name: N/A  . Number of children: 0  . Years of education: N/A   Occupational History  . disabled    Social History Main Topics  . Smoking status: Former Smoker    Packs/day: 0.50    Years: 40.00    Types: Cigarettes    Quit date: 01/19/2012  . Smokeless tobacco: Never Used     Comment: Smokes 1/2 pack of cigarettes daily  . Alcohol use No     Comment: HX 2 beers 3-4 days per week; QUIT DEC 2013  . Drug use: No     Comment: Hx cocaine yrs ago, Last marijuana OCT 2013  . Sexual activity: Not Currently    Partners: Female   Other Topics Concern  . Not on file   Social History Narrative   Lives w/ significant other, [name]    Review of Systems: See HPI, otherwise negative ROS  Physical Exam: BP (!) 155/84   Pulse 80   Temp 98 F (36.7 C) (Oral)   SpO2 94%  General:   Alert,  Well-developed, well-nourished, pleasant and cooperative in NAD Lungs:  Clear throughout to auscultation.   No wheezes, crackles, or rhonchi. No acute distress. Heart:  Regular rate and rhythm; no murmurs, clicks, rubs,  or gallops. Abdomen: Non-distended, normal bowel sounds.  Soft and nontender without appreciable mass or hepatosplenomegaly.  Pulses:  Normal pulses noted. Extremities:  Without clubbing or edema.  Impression:  Pleasant 66 year old gentleman with HCV-related cirrhosis, care by metastatic carcinoma with a recent suspicious drop in his hemoglobin or GI bleeding. Agree with need for EGD and colonoscopy. Explained to the patient if he has bulging varices,  would likely place bands. The risks, benefits, limitations, imponderables and alternatives regarding both EGD and colonoscopy have been reviewed with the patient. Questions have been answered. All parties agreeable.      Notice: This dictation was prepared with Dragon dictation along with smaller phrase technology. Any  transcriptional errors that result from this process are unintentional and may not be corrected upon review.

## 2016-11-02 NOTE — Op Note (Signed)
[facility] ([facility]) Patient Name: Kevin Dillon Procedure Date: 11/02/2016 10:42 AM MRN: [id number] Date of Birth: [birth year] Attending MD: Norvel Richards , MD CSN: [id number] Age: 66 Admit Type: Outpatient Procedure:                Colonoscopy Indications:              High risk colon cancer surveillance: Personal                            history of colonic polyps Providers:                Norvel Richards, MD, Gwenlyn Fudge RN, RN,                            Janeece Riggers, RN Referring MD:              Medicines:                Propofol per Anesthesia Complications:            No immediate complications. Estimated Blood Loss:     Estimated blood loss: none. Procedure:                Pre-Anesthesia Assessment:                           - Prior to the procedure, a History and Physical                            was performed, and patient medications and                            allergies were reviewed. The patient's tolerance of                            previous anesthesia was also reviewed. The risks                            and benefits of the procedure and the sedation                            options and risks were discussed with the patient.                            All questions were answered, and informed consent                            was obtained. Prior Anticoagulants: The patient                            last took Eliquis (apixaban) 4 days prior to the                            procedure. ASA Grade Assessment: III - A patient  with severe systemic disease. After reviewing the                            risks and benefits, the patient was deemed in                            satisfactory condition to undergo the procedure.                           After obtaining informed consent, the colonoscope                            was passed under direct vision. Throughout the                            procedure, the patient's blood  pressure, pulse, and                            oxygen saturations were monitored continuously. The                            EC-[id number] ([id number]) scope was introduced through                            the and advanced to the the cecum, identified by                            appendiceal orifice and ileocecal valve. The                            colonoscopy was performed without difficulty. The                            patient tolerated the procedure well. The ileocecal                            valve, appendiceal orifice, and rectum were                            photographed. The entire colon was well visualized.                            The quality of the bowel preparation was adequate. Scope In: 10:46:54 AM Scope Out: 11:03:37 AM Scope Withdrawal Time: 0 hours 11 minutes 18 seconds  Total Procedure Duration: 0 hours 16 minutes 43 seconds  Findings:      The perianal and digital rectal examinations were normal.      Two sessile polyps were found in the rectum and ileocecal valve. The       polyps were 3 to 5 mm in size. These polyps were removed with a cold       snare. Resection and retrieval were complete. Estimated blood loss was       minimal.      Multiple small-mouthed diverticula were found in the sigmoid colon and  descending colon.      The exam was otherwise without abnormality on direct and retroflexion       views. Impression:               - Two 3 to 5 mm polyps in the rectum and at the                            ileocecal valve, removed with a cold snare.                            Resected and retrieved.                           - Diverticulosis in the sigmoid colon and in the                            descending colon.                           - The examination was otherwise normal on direct                            and retroflexion views. Moderate Sedation:      Moderate (conscious) sedation was personally administered by an       anesthesia  professional. The following parameters were monitored: oxygen       saturation, heart rate, blood pressure, respiratory rate, EKG, adequacy       of pulmonary ventilation, and response to care. Total physician       intraservice time was 40 minutes. Recommendation:           - Patient has a contact number available for                            emergencies. The signs and symptoms of potential                            delayed complications were discussed with the                            patient. Return to normal activities tomorrow.                            Written discharge instructions were provided to the                            patient.                           - Resume previous diet.                           - Continue present medications.                           - Repeat colonoscopy date to be determined after  pending pathology results are reviewed to evaluate                            the response to therapy.                           - Return to GI clinic in 3 months. See EGD report. Procedure Code(s):        --- Professional ---                           [id number], Colonoscopy, flexible; with removal of                            tumor(s), polyp(s), or other lesion(s) by snare                            technique Diagnosis Code(s):        --- Professional ---                           Z86.010, Personal history of colonic polyps                           K62.1, Rectal polyp                           D12.0, Benign neoplasm of cecum                           K57.30, Diverticulosis of large intestine without                            perforation or abscess without bleeding CPT copyright 2016 [employer]. All rights reserved. The codes documented in this report are preliminary and upon coder review may  be revised to meet current compliance requirements. Cristopher Estimable. Brynlea Spindler, MD Norvel Richards, MD 11/02/2016 11:20:26 AM This  report has been signed electronically. Number of Addenda: 0

## 2016-11-02 NOTE — Discharge Instructions (Addendum)
Colonoscopy Discharge Instructions  Read the instructions outlined below and refer to this sheet in the next few weeks. These discharge instructions provide you with general information on caring for yourself after you leave the hospital. Your doctor may also give you specific instructions. While your treatment has been planned according to the most current medical practices available, unavoidable complications occasionally occur. If you have any problems or questions after discharge, call Dr. [name] at [phone]. ACTIVITY  You may resume your regular activity, but move at a slower pace for the next 24 hours.   Take frequent rest periods for the next 24 hours.   Walking will help get rid of the air and reduce the bloated feeling in your belly (abdomen).   No driving for 24 hours (because of the medicine (anesthesia) used during the test).    Do not sign any important legal documents or operate any machinery for 24 hours (because of the anesthesia used during the test).  NUTRITION  Drink plenty of fluids.   You may resume your normal diet as instructed by your doctor.   Begin with a light meal and progress to your normal diet. Heavy or fried foods are harder to digest and may make you feel sick to your stomach (nauseated).   Avoid alcoholic beverages for 24 hours or as instructed.  MEDICATIONS  You may resume your normal medications unless your doctor tells you otherwise.  WHAT YOU CAN EXPECT TODAY  Some feelings of bloating in the abdomen.   Passage of more gas than usual.   Spotting of blood in your stool or on the toilet paper.  IF YOU HAD POLYPS REMOVED DURING THE COLONOSCOPY:  No aspirin products for 7 days or as instructed.   No alcohol for 7 days or as instructed.   Eat a soft diet for the next 24 hours.  FINDING OUT THE RESULTS OF YOUR TEST Not all test results are available during your visit. If your test results are not back during the visit, make an appointment  with your caregiver to find out the results. Do not assume everything is normal if you have not heard from your caregiver or the medical facility. It is important for you to follow up on all of your test results.  SEEK IMMEDIATE MEDICAL ATTENTION IF:  You have more than a spotting of blood in your stool.   Your belly is swollen (abdominal distention).   You are nauseated or vomiting.   You have a temperature over 101.   You have abdominal pain or discomfort that is severe or gets worse throughout the day.    Colonoscopy Discharge Instructions  Read the instructions outlined below and refer to this sheet in the next few weeks. These discharge instructions provide you with general information on caring for yourself after you leave the hospital. Your doctor may also give you specific instructions. While your treatment has been planned according to the most current medical practices available, unavoidable complications occasionally occur. If you have any problems or questions after discharge, call Dr. [name] at [phone]. ACTIVITY  You may resume your regular activity, but move at a slower pace for the next 24 hours.   Take frequent rest periods for the next 24 hours.   Walking will help get rid of the air and reduce the bloated feeling in your belly (abdomen).   No driving for 24 hours (because of the medicine (anesthesia) used during the test).    Do not sign any important legal  documents or operate any machinery for 24 hours (because of the anesthesia used during the test).  NUTRITION  Drink plenty of fluids.   You may resume your normal diet as instructed by your doctor.   Begin with a light meal and progress to your normal diet. Heavy or fried foods are harder to digest and may make you feel sick to your stomach (nauseated).   Avoid alcoholic beverages for 24 hours or as instructed.  MEDICATIONS  You may resume your normal medications unless your doctor tells you otherwise.    WHAT YOU CAN EXPECT TODAY  Some feelings of bloating in the abdomen.   Passage of more gas than usual.   Spotting of blood in your stool or on the toilet paper.  IF YOU HAD POLYPS REMOVED DURING THE COLONOSCOPY:  No aspirin products for 7 days or as instructed.   No alcohol for 7 days or as instructed.   Eat a soft diet for the next 24 hours.  FINDING OUT THE RESULTS OF YOUR TEST Not all test results are available during your visit. If your test results are not back during the visit, make an appointment with your caregiver to find out the results. Do not assume everything is normal if you have not heard from your caregiver or the medical facility. It is important for you to follow up on all of your test results.  SEEK IMMEDIATE MEDICAL ATTENTION IF:  You have more than a spotting of blood in your stool.   Your belly is swollen (abdominal distention).   You are nauseated or vomiting.   You have a temperature over 101.   You have abdominal pain or discomfort that is severe or gets worse throughout the day.    EGD Discharge instructions Please read the instructions outlined below and refer to this sheet in the next few weeks. These discharge instructions provide you with general information on caring for yourself after you leave the hospital. Your doctor may also give you specific instructions. While your treatment has been planned according to the most current medical practices available, unavoidable complications occasionally occur. If you have any problems or questions after discharge, please call your doctor. ACTIVITY  You may resume your regular activity but move at a slower pace for the next 24 hours.   Take frequent rest periods for the next 24 hours.   Walking will help expel (get rid of) the air and reduce the bloated feeling in your abdomen.   No driving for 24 hours (because of the anesthesia (medicine) used during the test).   You may shower.   Do not sign any  important legal documents or operate any machinery for 24 hours (because of the anesthesia used during the test).  NUTRITION  Drink plenty of fluids.   You may resume your normal diet.   Begin with a light meal and progress to your normal diet.   Avoid alcoholic beverages for 24 hours or as instructed by your caregiver.  MEDICATIONS  You may resume your normal medications unless your caregiver tells you otherwise.  WHAT YOU CAN EXPECT TODAY  You may experience abdominal discomfort such as a feeling of fullness or gas pains.  FOLLOW-UP  Your doctor will discuss the results of your test with you.  SEEK IMMEDIATE MEDICAL ATTENTION IF ANY OF THE FOLLOWING OCCUR:  Excessive nausea (feeling sick to your stomach) and/or vomiting.   Severe abdominal pain and distention (swelling).   Trouble swallowing.   Temperature over  101 F (37.8 C).   Rectal bleeding or vomiting of blood.    GERD and colon polyp and diverticulosis information provided  Repeat EGD in 2 years to screen for varices  Begin Protonix 40 mg daily  Further recommendations to follow pending review of pathology report  Office visit with [place] in 3 months  Resume Xarelto today      Gastroesophageal Reflux Disease, Adult Normally, food travels down the esophagus and stays in the stomach to be digested. If a person has gastroesophageal reflux disease (GERD), food and stomach acid move back up into the esophagus. When this happens, the esophagus becomes sore and swollen (inflamed). Over time, GERD can make small holes (ulcers) in the lining of the esophagus. Follow these instructions at home: Diet  Follow a diet as told by your doctor. You may need to avoid foods and drinks such as: ? Coffee and tea (with or without caffeine). ? Drinks that contain alcohol. ? Energy drinks and sports drinks. ? Carbonated drinks or sodas. ? Chocolate and cocoa. ? Peppermint and mint flavorings. ? Garlic and  onions. ? Horseradish. ? Spicy and acidic foods, such as peppers, chili powder, curry powder, vinegar, hot sauces, and BBQ sauce. ? Citrus fruit juices and citrus fruits, such as oranges, lemons, and limes. ? Tomato-based foods, such as red sauce, chili, salsa, and pizza with red sauce. ? Fried and fatty foods, such as donuts, french fries, potato chips, and high-fat dressings. ? High-fat meats, such as hot dogs, rib eye steak, sausage, ham, and bacon. ? High-fat dairy items, such as whole milk, butter, and cream cheese.  Eat small meals often. Avoid eating large meals.  Avoid drinking large amounts of liquid with your meals.  Avoid eating meals during the 2-3 hours before bedtime.  Avoid lying down right after you eat.  Do not exercise right after you eat. General instructions  Pay attention to any changes in your symptoms.  Take over-the-counter and prescription medicines only as told by your doctor. Do not take aspirin, ibuprofen, or other NSAIDs unless your doctor says it is okay.  Do not use any tobacco products, including cigarettes, chewing tobacco, and e-cigarettes. If you need help quitting, ask your doctor.  Wear loose clothes. Do not wear anything tight around your waist.  Raise (elevate) the head of your bed about 6 inches (15 cm).  Try to lower your stress. If you need help doing this, ask your doctor.  If you are overweight, lose an amount of weight that is healthy for you. Ask your doctor about a safe weight loss goal.  Keep all follow-up visits as told by your doctor. This is important. Contact a doctor if:  You have new symptoms.  You lose weight and you do not know why it is happening.  You have trouble swallowing, or it hurts to swallow.  You have wheezing or a cough that keeps happening.  Your symptoms do not get better with treatment.  You have a hoarse voice. Get help right away if:  You have pain in your arms, neck, jaw, teeth, or back.  You  feel sweaty, dizzy, or light-headed.  You have chest pain or shortness of breath.  You throw up (vomit) and your throw up looks like blood or coffee grounds.  You pass out (faint).  Your poop (stool) is bloody or black.  You cannot swallow, drink, or eat. This information is not intended to replace advice given to you by your health care provider.  Make sure you discuss any questions you have with your health care provider. Document Released: 06/24/2007 Document Revised: 06/13/2015 Document Reviewed: 05/02/2014 Elsevier Interactive Patient Education  2018 [employer].    Colon Polyps Polyps are tissue growths inside the body. Polyps can grow in many places, including the large intestine (colon). A polyp may be a round bump or a mushroom-shaped growth. You could have one polyp or several. Most colon polyps are noncancerous (benign). However, some colon polyps can become cancerous over time. What are the causes? The exact cause of colon polyps is not known. What increases the risk? This condition is more likely to develop in people who:  Have a family history of colon cancer or colon polyps.  Are older than 23 or older than 45 if they are African American.  Have inflammatory bowel disease, such as ulcerative colitis or Crohn disease.  Are overweight.  Smoke cigarettes.  Do not get enough exercise.  Drink too much alcohol.  Eat a diet that is: ? High in fat and red meat. ? Low in fiber.  Had childhood cancer that was treated with abdominal radiation.  What are the signs or symptoms? Most polyps do not cause symptoms. If you have symptoms, they may include:  Blood coming from your rectum when having a bowel movement.  Blood in your stool.The stool may look dark red or black.  A change in bowel habits, such as constipation or diarrhea.  How is this diagnosed? This condition is diagnosed with a colonoscopy. This is a procedure that uses a lighted, flexible scope to  look at the inside of your colon. How is this treated? Treatment for this condition involves removing any polyps that are found. Those polyps will then be tested for cancer. If cancer is found, your health care provider will talk to you about options for colon cancer treatment. Follow these instructions at home: Diet  Eat plenty of fiber, such as fruits, vegetables, and whole grains.  Eat foods that are high in calcium and vitamin D, such as milk, cheese, yogurt, eggs, liver, fish, and broccoli.  Limit foods high in fat, red meats, and processed meats, such as hot dogs, sausage, bacon, and lunch meats.  Maintain a healthy weight, or lose weight if recommended by your health care provider. General instructions  Do not smoke cigarettes.  Do not drink alcohol excessively.  Keep all follow-up visits as told by your health care provider. This is important. This includes keeping regularly scheduled colonoscopies. Talk to your health care provider about when you need a colonoscopy.  Exercise every day or as told by your health care provider. Contact a health care provider if:  You have new or worsening bleeding during a bowel movement.  You have new or increased blood in your stool.  You have a change in bowel habits.  You unexpectedly lose weight. This information is not intended to replace advice given to you by your health care provider. Make sure you discuss any questions you have with your health care provider. Document Released: 10/02/2003 Document Revised: 06/13/2015 Document Reviewed: 11/26/2014 Elsevier Interactive Patient Education  [employer].   Diverticulosis Diverticulosis is a condition that develops when small pouches (diverticula) form in the wall of the large intestine (colon). The colon is where water is absorbed and stool is formed. The pouches form when the inside layer of the colon pushes through weak spots in the outer layers of the colon. You may have a  few pouches or  many of them. What are the causes? The cause of this condition is not known. What increases the risk? The following factors may make you more likely to develop this condition:  Being older than age 51. Your risk for this condition increases with age. Diverticulosis is rare among people younger than age 78. By age 20, many people have it.  Eating a low-fiber diet.  Having frequent constipation.  Being overweight.  Not getting enough exercise.  Smoking.  Taking over-the-counter pain medicines, like aspirin and ibuprofen.  Having a family history of diverticulosis.  What are the signs or symptoms? In most people, there are no symptoms of this condition. If you do have symptoms, they may include:  Bloating.  Cramps in the abdomen.  Constipation or diarrhea.  Pain in the lower left side of the abdomen.  How is this diagnosed? This condition is most often diagnosed during an exam for other colon problems. Because diverticulosis usually has no symptoms, it often cannot be diagnosed independently. This condition may be diagnosed by:  Using a flexible scope to examine the colon (colonoscopy).  Taking an X-ray of the colon after dye has been put into the colon (barium enema).  Doing a CT scan.  How is this treated? You may not need treatment for this condition if you have never developed an infection related to diverticulosis. If you have had an infection before, treatment may include:  Eating a high-fiber diet. This may include eating more fruits, vegetables, and grains.  Taking a fiber supplement.  Taking a live bacteria supplement (probiotic).  Taking medicine to relax your colon.  Taking antibiotic medicines.  Follow these instructions at home:  Drink 6-8 glasses of water or more each day to prevent constipation.  Try not to strain when you have a bowel movement.  If you have had an infection before: ? Eat more fiber as directed by your health  care provider or your diet and nutrition specialist (dietitian). ? Take a fiber supplement or probiotic, if your health care provider approves.  Take over-the-counter and prescription medicines only as told by your health care provider.  If you were prescribed an antibiotic, take it as told by your health care provider. Do not stop taking the antibiotic even if you start to feel better.  Keep all follow-up visits as told by your health care provider. This is important. Contact a health care provider if:  You have pain in your abdomen.  You have bloating.  You have cramps.  You have not had a bowel movement in 3 days. Get help right away if:  Your pain gets worse.  Your bloating becomes very bad.  You have a fever or chills, and your symptoms suddenly get worse.  You vomit.  You have bowel movements that are bloody or black.  You have bleeding from your rectum. Summary  Diverticulosis is a condition that develops when small pouches (diverticula) form in the wall of the large intestine (colon).  You may have a few pouches or many of them.  This condition is most often diagnosed during an exam for other colon problems.  If you have had an infection related to diverticulosis, treatment may include increasing the fiber in your diet, taking supplements, or taking medicines. This information is not intended to replace advice given to you by your health care provider. Make sure you discuss any questions you have with your health care provider. Document Released: 10/03/2003 Document Revised: 11/25/2015 Document Reviewed: 11/25/2015 Elsevier Interactive Patient  Education  2017 [employer].

## 2016-11-02 NOTE — Anesthesia Postprocedure Evaluation (Signed)
Anesthesia Post Note  Patient: Kevin Dillon  Procedure(s) Performed: COLONOSCOPY WITH PROPOFOL (N/A ) ESOPHAGOGASTRODUODENOSCOPY (EGD) WITH PROPOFOL (N/A ) BIOPSY POLYPECTOMY  Patient location during evaluation: PACU Anesthesia Type: MAC Level of consciousness: awake and alert and oriented Pain management: pain level controlled Vital Signs Assessment: post-procedure vital signs reviewed and stable Respiratory status: spontaneous breathing Cardiovascular status: blood pressure returned to baseline Postop Assessment: adequate PO intake and no apparent nausea or vomiting Anesthetic complications: no Comments: Late entry     Last Vitals:  Vitals:   11/02/16 1130 11/02/16 1138  BP: (!) 173/93 (!) 183/93  Pulse: 81   Resp: 12   Temp:  36.6 C  SpO2: 98% 96%    Last Pain:  Vitals:   11/02/16 1138  TempSrc: Oral  PainSc:                  Javel Hersh

## 2016-11-02 NOTE — Anesthesia Preprocedure Evaluation (Addendum)
Anesthesia Evaluation  Patient identified by MRN, date of birth, ID band Patient awake    Reviewed: Allergy & Precautions, NPO status , Patient's Chart, lab work & pertinent test results  History of Anesthesia Complications Negative for: history of anesthetic complications  Airway Mallampati: II  TM Distance: >3 FB     Dental  (+) Edentulous Upper, Edentulous Lower   Pulmonary Current Smoker, former smoker,    breath sounds clear to auscultation       Cardiovascular hypertension, Pt. on medications  Rhythm:Regular Rate:Normal     Neuro/Psych CVA    GI/Hepatic (+) Cirrhosis       , Hepatitis -, C  Endo/Other    Renal/GU      Musculoskeletal   Abdominal   Peds  Hematology   Anesthesia Other Findings   Reproductive/Obstetrics                            Anesthesia Physical Anesthesia Plan  ASA: III  Anesthesia Plan: MAC   Post-op Pain Management:    Induction: Intravenous  PONV Risk Score and Plan:   Airway Management Planned: Simple Face Mask  Additional Equipment:   Intra-op Plan:   Post-operative Plan:   Informed Consent: I have reviewed the patients History and Physical, chart, labs and discussed the procedure including the risks, benefits and alternatives for the proposed anesthesia with the patient or authorized representative who has indicated his/her understanding and acceptance.     Plan Discussed with:   Anesthesia Plan Comments:        Anesthesia Quick Evaluation

## 2016-11-02 NOTE — Transfer of Care (Signed)
Immediate Anesthesia Transfer of Care Note  Patient: Kevin Dillon  Procedure(s) Performed: COLONOSCOPY WITH PROPOFOL (N/A ) ESOPHAGOGASTRODUODENOSCOPY (EGD) WITH PROPOFOL (N/A ) BIOPSY POLYPECTOMY  Patient Location: PACU  Anesthesia Type:MAC  Level of Consciousness: awake and alert   Airway & Oxygen Therapy: Patient Spontanous Breathing  Post-op Assessment: Report given to RN  Post vital signs: Reviewed  Last Vitals:  Vitals:   11/02/16 0952  BP: (!) 155/84  Pulse: 80  Temp: 36.7 C  SpO2: 94%    Last Pain:  Vitals:   11/02/16 0952  TempSrc: Oral         Complications: No apparent anesthesia complications

## 2016-11-03 ENCOUNTER — Encounter: Payer: Self-pay | Admitting: Internal Medicine

## 2016-11-03 MED FILL — XARELTO 20 MG TABLET: 20 | 30 days supply | Qty: 30 | Fill #0

## 2016-11-04 ENCOUNTER — Encounter (HOSPITAL_COMMUNITY): Payer: Self-pay | Admitting: Internal Medicine

## 2016-11-04 ENCOUNTER — Other Ambulatory Visit: Payer: Self-pay | Admitting: *Deleted

## 2016-11-04 DIAGNOSIS — G893 Neoplasm related pain (acute) (chronic): Secondary | ICD-10-CM

## 2016-11-04 DIAGNOSIS — C220 Liver cell carcinoma: Secondary | ICD-10-CM

## 2016-11-04 DIAGNOSIS — D6481 Anemia due to antineoplastic chemotherapy: Secondary | ICD-10-CM

## 2016-11-04 DIAGNOSIS — T451X5A Adverse effect of antineoplastic and immunosuppressive drugs, initial encounter: Secondary | ICD-10-CM

## 2016-11-04 MED ORDER — OXYCODONE HCL 30 MG PO TABS: 30.0000 mg | ORAL_TABLET | ORAL | 0 refills | Status: DC | PRN

## 2016-11-04 MED FILL — oxyCODONE HCL 30 MG TABS: 30 | 5 days supply | Qty: 30 | Fill #0

## 2016-11-04 NOTE — Telephone Encounter (Signed)
Script for oxycodone 30 mg # 120 destroyed & # 30 reprinted per Cira Rue NP.  Informed Pat that script was ready & reminded of appt for tomorrow.

## 2016-11-04 NOTE — Telephone Encounter (Signed)
Received call from sig other, Kevin Dillon asking for refill on oxycodone short-acting. Will refill for p/u today.

## 2016-11-05 ENCOUNTER — Ambulatory Visit (HOSPITAL_BASED_OUTPATIENT_CLINIC_OR_DEPARTMENT_OTHER): Payer: Medicare Other | Admitting: Nurse Practitioner

## 2016-11-05 ENCOUNTER — Encounter: Payer: Self-pay | Admitting: Nurse Practitioner

## 2016-11-05 ENCOUNTER — Other Ambulatory Visit (HOSPITAL_BASED_OUTPATIENT_CLINIC_OR_DEPARTMENT_OTHER): Payer: Medicare Other

## 2016-11-05 ENCOUNTER — Ambulatory Visit (HOSPITAL_BASED_OUTPATIENT_CLINIC_OR_DEPARTMENT_OTHER): Payer: Medicare Other

## 2016-11-05 VITALS — BP 158/60 | HR 84 | Temp 97.7°F | Resp 18 | Ht 73.0 in | Wt 208.9 lb

## 2016-11-05 DIAGNOSIS — Z5112 Encounter for antineoplastic immunotherapy: Secondary | ICD-10-CM

## 2016-11-05 DIAGNOSIS — M62830 Muscle spasm of back: Secondary | ICD-10-CM

## 2016-11-05 DIAGNOSIS — R109 Unspecified abdominal pain: Secondary | ICD-10-CM | POA: Diagnosis not present

## 2016-11-05 DIAGNOSIS — M549 Dorsalgia, unspecified: Secondary | ICD-10-CM

## 2016-11-05 DIAGNOSIS — C220 Liver cell carcinoma: Secondary | ICD-10-CM | POA: Diagnosis not present

## 2016-11-05 DIAGNOSIS — G893 Neoplasm related pain (acute) (chronic): Secondary | ICD-10-CM | POA: Diagnosis not present

## 2016-11-05 LAB — CBC WITH DIFFERENTIAL/PLATELET
BASO%: 0.3 % (ref 0.0–2.0)
BASOS ABS: 0 10*3/uL (ref 0.0–0.1)
EOS ABS: 0.2 10*3/uL (ref 0.0–0.5)
EOS%: 3.1 % (ref 0.0–7.0)
HCT: 32.1 % — ABNORMAL LOW (ref 38.4–49.9)
HEMOGLOBIN: 10 g/dL — AB (ref 13.0–17.1)
LYMPH%: 29.8 % (ref 14.0–49.0)
MCH: 25.1 pg — AB (ref 27.2–33.4)
MCHC: 31.2 g/dL — AB (ref 32.0–36.0)
MCV: 80.5 fL (ref 79.3–98.0)
MONO#: 0.6 10*3/uL (ref 0.1–0.9)
MONO%: 8.9 % (ref 0.0–14.0)
NEUT#: 4.1 10*3/uL (ref 1.5–6.5)
NEUT%: 57.9 % (ref 39.0–75.0)
Platelets: 131 10*3/uL — ABNORMAL LOW (ref 140–400)
RBC: 3.99 10*6/uL — AB (ref 4.20–5.82)
RDW: 18.8 % — AB (ref 11.0–14.6)
WBC: 7.1 10*3/uL (ref 4.0–10.3)
lymph#: 2.1 10*3/uL (ref 0.9–3.3)

## 2016-11-05 LAB — COMPREHENSIVE METABOLIC PANEL
ALK PHOS: 94 U/L (ref 40–150)
ALT: 14 U/L (ref 0–55)
AST: 25 U/L (ref 5–34)
Albumin: 3.9 g/dL (ref 3.5–5.0)
Anion Gap: 9 mEq/L (ref 3–11)
BUN: 17.8 mg/dL (ref 7.0–26.0)
CHLORIDE: 107 meq/L (ref 98–109)
CO2: 25 meq/L (ref 22–29)
Calcium: 9.3 mg/dL (ref 8.4–10.4)
Creatinine: 1.5 mg/dL — ABNORMAL HIGH (ref 0.7–1.3)
EGFR: 46 mL/min/{1.73_m2} — AB (ref >60)
GLUCOSE: 124 mg/dL (ref 70–140)
POTASSIUM: 4.1 meq/L (ref 3.5–5.1)
SODIUM: 141 meq/L (ref 136–145)
Total Bilirubin: 0.35 mg/dL (ref 0.20–1.20)
Total Protein: 7.7 g/dL (ref 6.4–8.3)

## 2016-11-05 MED ORDER — SODIUM CHLORIDE 0.9% FLUSH
10.0000 mL | INTRAVENOUS | Status: DC | PRN
  Administered 2016-11-05: 10 mL
  Filled 2016-11-05: qty 10

## 2016-11-05 MED ORDER — SODIUM CHLORIDE 0.9 % IV SOLN
Freq: Once | INTRAVENOUS | Status: AC
  Administered 2016-11-05: 14:00:00 via INTRAVENOUS

## 2016-11-05 MED ORDER — CYCLOBENZAPRINE HCL 5 MG PO TABS: 5.0000 mg | ORAL_TABLET | Freq: Three times a day (TID) | ORAL | 1 refills | Status: DC | PRN

## 2016-11-05 MED ORDER — CYCLOBENZAPRINE HCL 5 MG PO TABS: 5.0000 mg | ORAL_TABLET | Freq: Three times a day (TID) | ORAL | 0 refills | Status: DC | PRN

## 2016-11-05 MED ORDER — HEPARIN SOD (PORK) LOCK FLUSH 100 UNIT/ML IV SOLN
500.0000 [IU] | Freq: Once | INTRAVENOUS | Status: AC | PRN
  Administered 2016-11-05: 500 [IU]
  Filled 2016-11-05: qty 5

## 2016-11-05 MED ORDER — SODIUM CHLORIDE 0.9 % IV SOLN
480.0000 mg | Freq: Once | INTRAVENOUS | Status: AC
  Administered 2016-11-05: 480 mg via INTRAVENOUS
  Filled 2016-11-05: qty 48

## 2016-11-05 MED FILL — CYCLOBENZAPRINE 5 MG TABLET: 5 | 10 days supply | Qty: 30 | Fill #0

## 2016-11-05 NOTE — Progress Notes (Signed)
Pt can not stay for Feraheme today, per Cira Rue NP pt will be back next week for it.

## 2016-11-05 NOTE — Patient Instructions (Signed)
[facility] Discharge Instructions for Patients Receiving Chemotherapy  Today you received the following chemotherapy agents Opdivo.  To help prevent nausea and vomiting after your treatment, we encourage you to take your nausea medication as directed.   If you develop nausea and vomiting that is not controlled by your nausea medication, call the clinic.   BELOW ARE SYMPTOMS THAT SHOULD BE REPORTED IMMEDIATELY:  *FEVER GREATER THAN 100.5 F  *CHILLS WITH OR WITHOUT FEVER  NAUSEA AND VOMITING THAT IS NOT CONTROLLED WITH YOUR NAUSEA MEDICATION  *UNUSUAL SHORTNESS OF BREATH  *UNUSUAL BRUISING OR BLEEDING  TENDERNESS IN MOUTH AND THROAT WITH OR WITHOUT PRESENCE OF ULCERS  *URINARY PROBLEMS  *BOWEL PROBLEMS  UNUSUAL RASH Items with * indicate a potential emergency and should be followed up as soon as possible.  Feel free to call the clinic you have any questions or concerns. The clinic phone number is [phone].  Please show the CHEMO ALERT CARD at check-in to the Emergency Department and triage nurse.    

## 2016-11-05 NOTE — Progress Notes (Signed)
[place]  [phone] [phone]  Clinic Follow up Note   Patient Care Team: Antonietta Jewel, MD as PCP - General (Internal Medicine) [name], Cristopher Estimable, MD as Attending Physician (Gastroenterology) Antonietta Jewel, MD as Referring Physician (Internal Medicine) Truitt Merle, MD as Consulting Physician (Hematology) [name], CRNP as Nurse Practitioner (Nurse Practitioner) 11/05/2016  SUMMARY OF ONCOLOGIC HISTORY: Oncology History   Hepatocellular carcinoma [facility])   Staging form: Liver (Excluding Intrahepatic Bile Ducts), AJCC 7th Edition   - Clinical stage from 04/16/2012: Stage II (T2(m), N0, M0) - Signed by Truitt Merle, MD on 10/12/2015   - Pathologic stage from 04/16/2013: Stage II (T2, N0, cM0) - Signed by Truitt Merle, MD on 10/12/2015      Hepatocellular carcinoma ([place])   [birth year]28 Imaging         09/14/2012 Tumor Marker    AFP 28.9      04/16/2013 Imaging    Abdominal MRI with and without contrast reviewed multifocal (4) liver lesions in both left and right lobes, most consistent with HCC, largest measuring 2.7 cm      04/16/2013 Initial Diagnosis    Hepatocellular carcinoma ([place])      04/28/2013 Procedure    Right TACE with lipiodol       06/15/2013 Procedure    Left TACE with [facility]       07/14/2013 Procedure    Right TACE with [facility]      09/05/2015 Imaging    CT abdomen with and without contrast showed a new 5.0 x 2.3 cm mass in the hepatic segment 8, invading and occluding the right anterior portal vein, most compatible with [place]. A portacaval node measuring 3.0 x 1.4 cm, previously 2.9 x 1.1 cm.      10/11/2015 Tumor Marker    AFP 5.3      11/11/2015 - 02/03/2016 Chemotherapy    sorafenib 200mg  bid started on 11/11/2015, increased to 400mg  bid in 2 weeks, stopped due to poor tolerance and probable disease progression       11/14/2015 Imaging    CT CAP 11/14/2015 IMPRESSION: Chest Impression: 1. RIGHT upper lobe pulmonary nodule is  indeterminate. No comparison available. 2. Ground-glass opacity and peripheral nodules in the RIGHT middle lobe appear post infectious or inflammatory.  Abdomen / Pelvis Impression: 1. 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 Thrombus also likely within the LEFT and RIGHT portal vein. Difficult to distinguish tumor thrombus versus bland thrombus. 2. Individual lesions are difficult to define in the RIGHT hepatic lobe. There is overall impression of progression of disease in the RIGHT hepatic lobe with multiple ill-defined enhancing lesions. 3. Periportal and shotty retroperitoneal adenopathy similar to prior.      01/25/2016 Imaging    CT Abdomen Pelvis w/ Contrast IMPRESSION: 1. Findings consistent with multifocal liver carcinoma with extensive portal vein thrombosis, and subsequent development of porta hepatis venous collaterals. Thrombus in the central portal vein is no longer expansile, but still expands the more peripheral portal vein and the right and left portal veins. There are few right liver bile ducts there are now dilated, which suggests mild progression of liver disease. 2. There is mild splenomegaly consistent or venous hypertension, which has mildly increased from the prior CT. 3. There are new lung abnormalities with interstitial thickening and ill-defined peribronchovascular nodular opacities mostly at the right lung base with a smaller area noted along the medial  left lower lobe. The etiology may be infectious. It may be inflammatory, possibly related to chemotherapy. Neoplastic disease is also possible. 4. There is no other evidence of metastatic disease within the abdomen or pelvis. No acute findings within the abdomen or pelvis      01/25/2016 - 01/25/2016 Hospital Admission    Pt was seen in ED for worsening abdominal pain, treated with pain meds       02/04/2016 -   Chemotherapy    The patient started on immunotherapy treatment nivolumab every 2 weeks; on 04/30/16 dose was reduced to 420mg  every 3 weeks       02/14/2016 Imaging    CT CAP w/ contrast 1. Slight interval increase in size of the mediastinal lymph nodes. 2. Persistent extensive lower lobe peribronchial thickening and patchy infiltrates. Possible aspiration pneumonia. 3. Persistent tree-in-bud appearance in the right middle lobe, likely chronic inflammation or atypical infection. 4. Improved CT appearance of the liver. Two small residual arterial phase enhancing lesions are identified. 5. Chronic portal vein thrombosis with cavernous transformation of portal venous collaterals. Esophageal varices are again noted. 6. Stable upper abdominal lymph nodes likely related to cirrhosis.       04/16/2016 Tumor Marker    AFP 4.1       04/17/2016 Imaging     CT CAP W PELVIS IMPRESSION: 1. Stable mild mediastinal lymphadenopathy. 2. Interval improvement in the bibasilar peribronchial thickening, airway impaction, and peribronchovascular nodularity. Imaging features suggest marked improvement in bilateral low the atypical infection. Changes in the right middle lobe with more stable and may represent scarring from previous infectious/inflammatory etiology. 3. The small hypervascular lesions seen in the anterior left liver on the previous study are not evident today. There is some subtle, ill-defined hyperenhancement in the central left liver which is nonspecific. Attention to this area on followup imaging recommended. 4. Chronic portal vein inclusion with cavernous transformation in the porta hepatis and paraesophageal varices. 5. Stable appearance hepatoduodenal and retroperitoneal lymphadenopathy, likely chronic and related to liver disease.      04/17/2016 Imaging    CT CAP CONTRAST IMPRESSION: 1. Stable mild mediastinal lymphadenopathy. 2. Interval improvement in the bibasilar  peribronchial thickening, airway impaction, and peribronchovascular nodularity. Imaging features suggest marked improvement in bilateral low the atypical infection. Changes in the right middle lobe with more stable and may represent scarring from previous infectious/inflammatory etiology. 3. The small hypervascular lesions seen in the anterior left liver on the previous study are not evident today. There is some subtle, ill-defined hyperenhancement in the central left liver which is nonspecific. Attention to this area on followup imaging recommended. 4. Chronic portal vein inclusion with cavernous transformation in the porta hepatis and paraesophageal varices. 5. Stable appearance hepatoduodenal and retroperitoneal lymphadenopathy, likely chronic and related to liver disease.      05/21/2016 Imaging    MR Abdomen W WO Contrast IMPRESSION: 1. Limited motion degraded scan. Cirrhosis. No evidence of a liver mass within these limitations. 2. Mild splenomegaly. No ascites. Stable mild paraumbilical and gastroesophageal varices. 3. Stable nonspecific mild retroperitoneal lymphadenopathy. 4. 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      06/04/2016 Tumor Marker    AFP 3.5       08/25/2016 Imaging    MR Abdomen w wo contrast  IMPRESSION: 1. Mild-to-moderate motion degradation, preferentially involving the pre and postcontrast dynamic images. On follow-up, given the extent of motion on this exam, multiphase CT may be preferred. 2. Given  these limitations, no evidence of hepatocellular carcinoma. 3. Marked cirrhosis and portal venous hypertension. 4. Slight increase in abdominal adenopathy, which is most likely reactive. Recommend attention on follow-up. 5. Cholelithiasis.       11/02/2016 Procedure    Upper endoscopy and colonoscopy  Diagnosis 1. Stomach, biopsy - MILD CHRONIC GASTRITIS. - NEGATIVE FOR HELICOBACTER PYLORI. - NO  INTESTINAL METAPLASIA, DYSPLASIA, OR MALIGNANCY. 2. Colon, polyp(s), opposite ileocecal valve - TUBULAR ADENOMA. - NO HIGH GRADE DYSPLASIA OR MALIGNANCY. 3. Rectum, polyp(s) - HYPERPLASTIC POLYP. - NO DYSPLASIA OR MALIGNANCY.     PREVIOUS THERAPY: Nivolumab 240mg  every 2 weeks, started on 02/04/2015; on 04/30/16 switched to every 3 weeks due to fatigue; change from 240mg  to 480mg  every 4 weeks starting 09/03/2016  CURRENT THERAPY: Nivolumab 480 mg every 4 weeks starting 09/03/16; Feraheme PRN   INTERVAL HISTORY: Mr. Caiazzo returns for follow up as scheduled. He received Nivolumab 4 weeks ago and feraheme on 10/11. He had GI work up with upper endoscopy and colonoscopy on 11/02/16. He had a PAC placed without difficulty. Today he reports mild fatigue but continues activities as normal. He has good appetite, no cough, chest pain, shortness of breath, edema, fever, chills, GI issues, or skin changes. He has mild stable numbness and tingling to hands and feet, unchanged in 3-4 years, function remains intact. He has chronic back pain and muscle spasm in his lower back bilaterally. He takes oxycodone and oxycontin. He was previously taking flexeril when his back would tighten up while standing, bending, or working outside. He is out of flexeril today.   REVIEW OF SYSTEMS:   Constitutional: Denies fevers or chills (+) weight gain (+) mild fatigue over baseline Eyes: Denies blurriness of vision Ears, nose, mouth, throat, and face: Denies mucositis or sore throat Respiratory: Denies cough, dyspnea or wheezes Cardiovascular: Denies palpitation, chest discomfort or lower extremity swelling Gastrointestinal:  Denies nausea, vomiting, constipation, diarrhea, heartburn or change in bowel habits. Denies blood in stool  Skin: Denies abnormal skin rashes Lymphatics: Denies new lymphadenopathy or easy bruising Neurological:Denies new weaknesses (+) stable chronic numbness/tingling to hands and feet, function  not impaired Behavioral/Psych: Mood is stable, no new changes  MSK: (+) intermittent muscle spasm to lower back bilaterally All other systems were reviewed with the patient and are negative.  MEDICAL HISTORY:  Past Medical History:  Diagnosis Date  . Cancer ([place])    liver  . Cholelithiasis   . Chronic hepatitis C ([place])   . Chronic knee pain   . Chronic left shoulder pain   . CVA (cerebral infarction)   . Gout   . Hepatitis C    genotype 1b.  pt has been vaccinated against hep A and B.  . Hypertension   . Osteoarthritis   . Schatzki's ring   . Tubular adenoma of colon 12/2011    SURGICAL HISTORY: Past Surgical History:  Procedure Laterality Date  . AMPUTATION Left 09/17/2012   Procedure: SMALL FINGER EXTENSOR TENDON REPAIR; METACARPAL LEVEL AMPUTATION RING FINGER; PROXIMAL PHALANX LEVEL AMPUTATION LONG FINGER;  Surgeon: Schuyler Amor, MD;  Location: [facility];  Service: Orthopedics;  Laterality: Left;  . Arm surgery     right/plate in arm  . BIOPSY  11/02/2016   Procedure: BIOPSY;  Surgeon: Daneil Dolin, MD;  [facility];  Service: Endoscopy;;  gastric   . COLONOSCOPY WITH ESOPHAGOGASTRODUODENOSCOPY (EGD)  12/30/2011   RMR: Noncritical Schatzki's ring;  Hiatal hernia, Tubular ADENOMA removed from splenic flexure, otherwise normal  colonoscopy  . COLONOSCOPY WITH PROPOFOL N/A 11/02/2016   Procedure: COLONOSCOPY WITH PROPOFOL;  Surgeon: Daneil Dolin, MD;  [facility];  Service: Endoscopy;  Laterality: N/A;  1045  . ESOPHAGOGASTRODUODENOSCOPY (EGD) WITH PROPOFOL N/A 11/02/2016   Procedure: ESOPHAGOGASTRODUODENOSCOPY (EGD) WITH PROPOFOL;  Surgeon: Daneil Dolin, MD;  [facility];  Service: Endoscopy;  Laterality: N/A;  . IR FLUORO GUIDE PORT INSERTION RIGHT  10/21/2016  . IR US GUIDE VASC ACCESS RIGHT  10/21/2016  . LEG SURGERY     left  . POLYPECTOMY  11/02/2016   Procedure: POLYPECTOMY;  Surgeon: Daneil Dolin, MD;  [facility];  Service: Endoscopy;;  colon  . WOUND EXPLORATION Left 09/17/2012   Procedure: WOUND EXPLORATION;  Surgeon: Schuyler Amor, MD;  Location: [facility];  Service: Orthopedics;  Laterality: Left;    I have reviewed the social history and family history with the patient and they are unchanged from previous note.  ALLERGIES:  has No Known Allergies.  MEDICATIONS:  Current Outpatient Prescriptions  Medication Sig Dispense Refill  . ALPRAZolam (XANAX) 1 MG tablet Take 1 tablet (1 mg total) by mouth 3 (three) times daily as needed for anxiety. 90 tablet 0  . cyclobenzaprine (FLEXERIL) 5 MG tablet Take 1 tablet (5 mg total) by mouth 3 (three) times daily as needed for muscle spasms. (Patient not taking: Reported on 11/05/2016) 30 tablet 1  . furosemide (LASIX) 20 MG tablet Take 20 mg by mouth daily.   3  . hydrALAZINE (APRESOLINE) 25 MG tablet Take 75 mg by mouth daily.     Marland Kitchen lactulose (CHRONULAC) 10 GM/15ML solution Take 30 g by mouth daily as needed for moderate constipation.     . lidocaine-prilocaine (EMLA) cream Apply 1 application topically as needed. Apply to portacath 1 1/2 hours - 2 hours prior to procedures as needed. 30 g 1  . megestrol (MEGACE) 40 MG/ML suspension Take 400 mg by mouth daily.     . Multiple Vitamins-Minerals (CENTRUM SILVER PO) Take 1 tablet by mouth daily.     Marland Kitchen oxyCODONE (OXYCONTIN) 80 mg 12 hr tablet Take 2 tablets (160 mg total) by mouth every 12 (twelve) hours. 120 tablet 0  . oxycodone (ROXICODONE) 30 MG immediate release tablet Take 1 tablet (30 mg total) by mouth every 4 (four) hours as needed for pain. 30 tablet 0  . prochlorperazine (COMPAZINE) 10 MG tablet TAKE 1 TABLET BY MOUTH EVERY 8 HOURS AS NEEDED FOR NAUSEA/VOMITING (Patient taking differently: TAKE 10 MG BY MOUTH EVERY 8 HOURS AS NEEDED FOR NAUSEA/VOMITING) 30 tablet 1  . XARELTO 20 MG TABS tablet TAKE 1 TABLET BY MOUTH DAILY WITH SUPPER. 30 tablet 2   No current facility-administered  medications for this visit.     PHYSICAL EXAMINATION: ECOG PERFORMANCE STATUS: 1 - Symptomatic but completely ambulatory  Vitals:   11/05/16 1303  BP: (!) 158/60  Pulse: 84  Resp: 18  Temp: 97.7 F (36.5 C)  SpO2: 99%   Filed Weights   11/05/16 1303  Weight: 208 lb 14.4 oz (94.8 kg)    GENERAL:alert, no distress and comfortable SKIN: skin color, texture, turgor are normal, no rashes or significant lesions EYES: normal, Conjunctiva are pink and non-injected, sclera clear OROPHARYNX:no exudate, no erythema and lips, buccal mucosa, and tongue normal  NECK: supple, thyroid normal size, non-tender, without nodularity LYMPH:  no palpable lymphadenopathy in the cervical, axillary or inguinal LUNGS: clear to auscultation and percussion with normal  breathing effort HEART: regular rate & rhythm and no murmurs and no lower extremity edema ABDOMEN: protuberant, firm, non-tender and normal bowel sounds Musculoskeletal:no cyanosis of digits and no clubbing. No spinal tenderness to palpation.  NEURO: alert & oriented x 3 with fluent speech, no focal motor/sensory deficits  LABORATORY DATA:  I have reviewed the data as listed CBC Latest Ref Rng & Units 11/05/2016 10/29/2016 10/21/2016  WBC 4.0 - 10.3 10e3/uL 7.1 8.4 9.2  Hemoglobin 13.0 - 17.1 g/dL 10.0(L) 10.0(L) 9.0(L)  Hematocrit 38.4 - 49.9 % 32.1(L) 32.3(L) 29.5(L)  Platelets 140 - 400 10e3/uL 131(L) 167 199     CMP Latest Ref Rng & Units 11/05/2016 10/29/2016 10/21/2016  Glucose 70 - 140 mg/dl 124 120 107(H)  BUN 7.0 - 26.0 mg/dL 17.8 18.3 18  Creatinine 0.7 - 1.3 mg/dL 1.5(H) 1.7(H) 1.38(H)  Sodium 136 - 145 mEq/L 141 140 137  Potassium 3.5 - 5.1 mEq/L 4.1 3.9 4.1  Chloride 101 - 111 mmol/L - - 107  CO2 22 - 29 mEq/L 25 22 21(L)  Calcium 8.4 - 10.4 mg/dL 9.3 9.5 8.8(L)  Total Protein 6.4 - 8.3 g/dL 7.7 8.1 -  Total Bilirubin 0.20 - 1.20 mg/dL 0.35 0.29 -  Alkaline Phos 40 - 150 U/L 94 97 -  AST 5 - 34 U/L 25 24 -  ALT 0 -  55 U/L 14 13 -     RADIOGRAPHIC STUDIES: I have personally reviewed the radiological images as listed and agreed with the findings in the report. No results found.   ASSESSMENT & PLAN: 66 y.o. Caucasian male with past medical history of successfully treated hepatitis C, liver cirrhosis, history of ascites and encephalopathy, recurrent HCC  1. Recurrent HCC, initially stage II 2. Abdominal pain/ back pain  3. Portal vein thrombosis 7. HTN 8. Worsening anemia  Mr. Moure appears stable today. He had GI work up with upper endoscopy and colonoscopy per Dr. [name] on 11/02/16. Biopsy of the stomach showed mild chronic gastritis, negative for H. Pylori or malignancy. Colon and rectal polyps were negative for dysplasia or malignancy. He will f/u with GI again in 01/2017. He appears to be tolerating nivolumab well overall. Labs reviewed, adequate for treatment, physical exam unchanged. He will proceed with next cycle today and again in 4 weeks. He is due another dose of feraheme today, but he cannot stay late for infusion. He will return in 1 week for IV feraheme. For back spasm I refilled flexeril. He picked up oxycodone Rx earlier this week.   PLAN: -Labs reviewed, proceed with next cycle nivolumab today -return in 1 week for IV feraheme -lab, flush, MD f/u, nivolumab in 4 weeks   All questions were answered. The patient knows to call the clinic with any problems, questions or concerns. No barriers to learning was detected. I spent 25 counseling the patient face to face. The total time spent in the appointment was 30 minutes and more than 50% was on counseling and review of test results     Alla Feeling, NP 11/05/16

## 2016-11-06 ENCOUNTER — Telehealth: Payer: Self-pay | Admitting: Hematology

## 2016-11-06 LAB — AFP TUMOR MARKER: AFP, SERUM, TUMOR MARKER: 4.8 ng/mL (ref 0.0–8.3)

## 2016-11-06 NOTE — Telephone Encounter (Signed)
Called patient regarding November

## 2016-11-11 ENCOUNTER — Other Ambulatory Visit: Payer: Self-pay | Admitting: Hematology

## 2016-11-11 ENCOUNTER — Other Ambulatory Visit: Payer: Self-pay

## 2016-11-11 ENCOUNTER — Telehealth: Payer: Self-pay

## 2016-11-11 DIAGNOSIS — G893 Neoplasm related pain (acute) (chronic): Secondary | ICD-10-CM

## 2016-11-11 DIAGNOSIS — T451X5A Adverse effect of antineoplastic and immunosuppressive drugs, initial encounter: Secondary | ICD-10-CM

## 2016-11-11 DIAGNOSIS — C220 Liver cell carcinoma: Secondary | ICD-10-CM

## 2016-11-11 DIAGNOSIS — D6481 Anemia due to antineoplastic chemotherapy: Secondary | ICD-10-CM

## 2016-11-11 MED ORDER — ALPRAZOLAM 1 MG PO TABS: 1.0000 mg | ORAL_TABLET | Freq: Three times a day (TID) | ORAL | 0 refills | Status: DC | PRN

## 2016-11-11 MED ORDER — OXYCODONE HCL 30 MG PO TABS: 30.0000 mg | ORAL_TABLET | ORAL | 0 refills | Status: DC | PRN

## 2016-11-11 MED ORDER — OXYCODONE HCL ER 80 MG PO T12A: 160.0000 mg | EXTENDED_RELEASE_TABLET | Freq: Two times a day (BID) | ORAL | 0 refills | Status: DC

## 2016-11-11 MED FILL — OxyCONTIN 80 MG T12A: 80 | 30 days supply | Qty: 120 | Fill #0

## 2016-11-11 MED FILL — ALPRAZolam 1 MG TABS: 1 | 30 days supply | Qty: 90 | Fill #0

## 2016-11-11 NOTE — Telephone Encounter (Signed)
Called and told Rx's ready for pick-up.

## 2016-11-13 ENCOUNTER — Ambulatory Visit (HOSPITAL_BASED_OUTPATIENT_CLINIC_OR_DEPARTMENT_OTHER): Payer: Medicare Other

## 2016-11-13 ENCOUNTER — Ambulatory Visit (HOSPITAL_BASED_OUTPATIENT_CLINIC_OR_DEPARTMENT_OTHER): Payer: Medicare Other | Admitting: Medical

## 2016-11-13 VITALS — BP 152/72 | HR 80 | Temp 98.1°F | Resp 18

## 2016-11-13 DIAGNOSIS — D500 Iron deficiency anemia secondary to blood loss (chronic): Secondary | ICD-10-CM

## 2016-11-13 MED ORDER — FAMOTIDINE IN NACL 20-0.9 MG/50ML-% IV SOLN
20.0000 mg | Freq: Once | INTRAVENOUS | Status: AC
  Administered 2016-11-13: 20 mg via INTRAVENOUS

## 2016-11-13 MED ORDER — SODIUM CHLORIDE 0.9% FLUSH
10.0000 mL | INTRAVENOUS | Status: DC | PRN
  Administered 2016-11-13: 10 mL
  Filled 2016-11-13: qty 10

## 2016-11-13 MED ORDER — SODIUM CHLORIDE 0.9 % IV SOLN
Freq: Once | INTRAVENOUS | Status: AC
  Administered 2016-11-13: 14:00:00 via INTRAVENOUS

## 2016-11-13 MED ORDER — METHYLPREDNISOLONE SODIUM SUCC 125 MG IJ SOLR
125.0000 mg | Freq: Once | INTRAMUSCULAR | Status: AC
  Administered 2016-11-13: 125 mg via INTRAVENOUS

## 2016-11-13 MED ORDER — HEPARIN SOD (PORK) LOCK FLUSH 100 UNIT/ML IV SOLN
500.0000 [IU] | Freq: Once | INTRAVENOUS | Status: AC | PRN
  Administered 2016-11-13: 500 [IU]
  Filled 2016-11-13: qty 5

## 2016-11-13 MED ORDER — SODIUM CHLORIDE 0.9 % IV SOLN
510.0000 mg | Freq: Once | INTRAVENOUS | Status: AC
  Administered 2016-11-13: 510 mg via INTRAVENOUS
  Filled 2016-11-13: qty 17

## 2016-11-13 NOTE — Progress Notes (Signed)
[facility] Progress Note   AVANEESH PEPITONE [id number] 07/21/1950 66 y.o.  [name] is managed by Dr. Truitt Merle  Actively treated with chemotherapy: yes  Current Therapy: Nivolumab  Last Treated: 10 / 18 / 2018  Assessment: Plan:    Iron deficiency anemia, unspecified iron deficiency anemia type  Infusion reaction, initial encounter   Iron deficiency anemia: Patient received his second treatment of Feraheme today.   Infusion reaction: The patient received his second treatment of Feraheme today. He was being watched for 30 minutes after his infusion when he developed 3/10 left sternal chest pain without diaphoresis or shortness of breath. An EKG returned showing a normal sinus rhythm with prolonged QT. The patient had recently had an EKG completed in anesthesia and had been referred to his primary care provider for follow-up. The patient was given Pepcid 20 mg IV and Solu-Medrol 125 mg IV with complete resolution of his chest pain.    Please see After Visit Summary for patient specific instructions.  Future Appointments Date Time Provider [place]  12/03/2016 1:30 PM [facility] 5 [facility] None  12/03/2016 1:45 PM [facility] FLUSH NURSE [facility] None  12/03/2016 2:15 PM Truitt Merle, MD [facility] None  12/03/2016 3:30 PM [facility] A1 [facility] None  02/02/2017 10:00 AM Mahala Menghini, PA-C [facility]    No orders of the defined types were placed in this encounter.      Subjective:   Patient ID:  KANNAN PROIA is a 66 y.o. (DOB Mar 14, 1950) male.  Chief Complaint: No chief complaint on file.   HPI TEVITA GOMER is a 67 year old male with a recurrent hepatocellular carcinoma. The patient is being treated with nivolumab. His last treatment was 11/05/2016. He was here today to receive his second dose of IV Feraheme. He received the entire infusion and was being watched for around 30 minutes after his infusion was completed when he  developed 3/10 left sternal chest pain without diaphoresis or shortness of breath. An EKG was completed which showed a normal sinus rhythm at 79 bpm with prolonged QT. The patient had recently had an EKG completed in anesthesia and had been referred to his primary care provider for follow-up. The patient was given Pepcid 20 mg IV and Solu-Medrol 125 mg IV with resolution of his left chest pain. The patient's vital signs were stable.  Medications: I have reviewed the patient's current medications.  Allergies: No Known Allergies  Past Medical History:  Diagnosis Date  . Cancer ([place])    liver  . Cholelithiasis   . Chronic hepatitis C ([place])   . Chronic knee pain   . Chronic left shoulder pain   . CVA (cerebral infarction)   . Gout   . Hepatitis C    genotype 1b.  pt has been vaccinated against hep A and B.  . Hypertension   . Osteoarthritis   . Schatzki's ring   . Tubular adenoma of colon 12/2011    Past Surgical History:  Procedure Laterality Date  . AMPUTATION Left 09/17/2012   Procedure: SMALL FINGER EXTENSOR TENDON REPAIR; METACARPAL LEVEL AMPUTATION RING FINGER; PROXIMAL PHALANX LEVEL AMPUTATION LONG FINGER;  Surgeon: Schuyler Amor, MD;  Location: [place];  Service: Orthopedics;  Laterality: Left;  . Arm surgery     right/plate in arm  . BIOPSY  11/02/2016   Procedure: BIOPSY;  Surgeon: Daneil Dolin, MD;  [facility];  Service: Endoscopy;;  gastric   . COLONOSCOPY WITH  ESOPHAGOGASTRODUODENOSCOPY (EGD)  12/30/2011   RMR: Noncritical Schatzki's ring;  Hiatal hernia, Tubular ADENOMA removed from splenic flexure, otherwise normal colonoscopy  . COLONOSCOPY WITH PROPOFOL N/A 11/02/2016   Procedure: COLONOSCOPY WITH PROPOFOL;  Surgeon: Daneil Dolin, MD;  [facility];  Service: Endoscopy;  Laterality: N/A;  1045  . ESOPHAGOGASTRODUODENOSCOPY (EGD) WITH PROPOFOL N/A 11/02/2016   Procedure: ESOPHAGOGASTRODUODENOSCOPY (EGD) WITH PROPOFOL;  Surgeon: Daneil Dolin, MD;  [facility];  Service: Endoscopy;  Laterality: N/A;  . IR FLUORO GUIDE PORT INSERTION RIGHT  10/21/2016  . IR US GUIDE VASC ACCESS RIGHT  10/21/2016  . LEG SURGERY     left  . POLYPECTOMY  11/02/2016   Procedure: POLYPECTOMY;  Surgeon: Daneil Dolin, MD;  [facility];  Service: Endoscopy;;  colon  . WOUND EXPLORATION Left 09/17/2012   Procedure: WOUND EXPLORATION;  Surgeon: Schuyler Amor, MD;  Location: [place];  Service: Orthopedics;  Laterality: Left;    Family History  Problem Relation Age of Onset  . Cirrhosis Father 32  . Cancer Father        liver cancer   . Lung cancer Mother 63  . Colon cancer Neg Hx     Social History   Social History  . Marital status: Single    Spouse name: N/A  . Number of children: 0  . Years of education: N/A   Occupational History  . disabled    Social History Main Topics  . Smoking status: Former Smoker    Packs/day: 0.50    Years: 40.00    Types: Cigarettes    Quit date: 01/19/2012  . Smokeless tobacco: Never Used     Comment: Smokes 1/2 pack of cigarettes daily  . Alcohol use No     Comment: HX 2 beers 3-4 days per week; QUIT DEC 2013  . Drug use: No     Comment: Hx cocaine yrs ago, Last marijuana OCT 2013  . Sexual activity: Not Currently    Partners: Female   Other Topics Concern  . Not on file   Social History Narrative   Lives w/ significant other, [name]    Past Medical History, Surgical history, Social history, and Family history were reviewed and updated as appropriate.   Please see review of systems for further details on the patient's review from today.   Review of Systems:  Review of Systems  Constitutional: Negative for diaphoresis.  Respiratory: Negative for cough, choking and shortness of breath.   Cardiovascular: Positive for chest pain. Negative for palpitations and leg swelling.    Objective:   Physical Exam:  There were no vitals taken for this  visit. ECOG: 0  Physical Exam  Constitutional: No distress.  HENT:  Head: Normocephalic and atraumatic.  Cardiovascular: Normal rate, regular rhythm and normal heart sounds.  Exam reveals no gallop and no friction rub.   No murmur heard. Pulmonary/Chest: Effort normal and breath sounds normal. No respiratory distress. He has no wheezes. He has no rales.  Musculoskeletal: He exhibits no edema.  Neurological: He is alert.  Skin: Skin is warm and dry. He is not diaphoretic.    Lab Review:     Component Value Date/Time   NA 141 11/05/2016 1235   K 4.1 11/05/2016 1235   CL 107 10/21/2016 1241   CL 104 07/22/2011 1108   CO2 25 11/05/2016 1235   GLUCOSE 124 11/05/2016 1235   BUN 17.8 11/05/2016 1235   CREATININE  1.5 (H) 11/05/2016 1235   CALCIUM 9.3 11/05/2016 1235   PROT 7.7 11/05/2016 1235   ALBUMIN 3.9 11/05/2016 1235   AST 25 11/05/2016 1235   ALT 14 11/05/2016 1235   ALKPHOS 94 11/05/2016 1235   BILITOT 0.35 11/05/2016 1235   GFRNONAA 52 (L) 10/21/2016 1241   GFRAA >60 10/21/2016 1241       Component Value Date/Time   WBC 7.1 11/05/2016 1235   WBC 9.2 10/21/2016 1241   RBC 3.99 (L) 11/05/2016 1235   RBC 3.69 (L) 10/21/2016 1241   HGB 10.0 (L) 11/05/2016 1235   HCT 32.1 (L) 11/05/2016 1235   PLT 131 (L) 11/05/2016 1235   MCV 80.5 11/05/2016 1235   MCH 25.1 (L) 11/05/2016 1235   MCH 24.4 (L) 10/21/2016 1241   MCHC 31.2 (L) 11/05/2016 1235   MCHC 30.5 10/21/2016 1241   RDW 18.8 (H) 11/05/2016 1235   LYMPHSABS 2.1 11/05/2016 1235   MONOABS 0.6 11/05/2016 1235   EOSABS 0.2 11/05/2016 1235   BASOSABS 0.0 11/05/2016 1235   -------------------------------  Imaging from last 24 hours (if applicable):  Radiology interpretation: Ir US Guide Vasc Access Right  Result Date: 10/21/2016 INDICATION: History of hepatocellular carcinoma. In need of durable intravenous access for chemotherapy administration EXAM: IMPLANTED PORT A CATH PLACEMENT WITH ULTRASOUND AND  FLUOROSCOPIC GUIDANCE COMPARISON:  None. MEDICATIONS: Ancef 2 gm IV; The antibiotic was administered within an appropriate time interval prior to skin puncture. ANESTHESIA/SEDATION: Moderate (conscious) sedation was employed during this procedure. A total of Versed 4 mg and Fentanyl 200 mcg was administered intravenously. Moderate Sedation Time: 25 minutes. The patient's level of consciousness and vital signs were monitored continuously by radiology nursing throughout the procedure under my direct supervision. CONTRAST:  None FLUOROSCOPY TIME:  18 seconds (7 mGy) COMPLICATIONS: None immediate. PROCEDURE: The procedure, risks, benefits, and alternatives were explained to the patient. Questions regarding the procedure were encouraged and answered. The patient understands and consents to the procedure. The right neck and chest were prepped with chlorhexidine in a sterile fashion, and a sterile drape was applied covering the operative field. Maximum barrier sterile technique with sterile gowns and gloves were used for the procedure. A timeout was performed prior to the initiation of the procedure. Local anesthesia was provided with 1% lidocaine with epinephrine. After creating a small venotomy incision, a micropuncture kit was utilized to access the internal jugular vein. Real-time ultrasound guidance was utilized for vascular access including the acquisition of a permanent ultrasound image documenting patency of the accessed vessel. The microwire was utilized to measure appropriate catheter length. A subcutaneous port pocket was then created along the upper chest wall utilizing a combination of sharp and blunt dissection. The pocket was irrigated with sterile saline. A single lumen ISP power injectable port was chosen for placement. The 8 Fr catheter was tunneled from the port pocket site to the venotomy incision. The port was placed in the pocket. The external catheter was trimmed to appropriate length. At the  venotomy, an 8 Fr peel-away sheath was placed over a guidewire under fluoroscopic guidance. The catheter was then placed through the sheath and the sheath was removed. Final catheter positioning was confirmed and documented with a fluoroscopic spot radiograph. The port was accessed with a Huber needle, aspirated and flushed with heparinized saline. The venotomy site was closed with an interrupted 4-0 Vicryl suture. The port pocket incision was closed with interrupted 2-0 Vicryl suture and the skin was opposed with a running subcuticular 4-0  Vicryl suture. Dermabond and Steri-strips were applied to both incisions. Dressings were placed. The patient tolerated the procedure well without immediate post procedural complication. FINDINGS: After catheter placement, the tip lies within the superior cavoatrial junction. The catheter aspirates and flushes normally and is ready for immediate use. IMPRESSION: Successful placement of a right internal jugular approach power injectable Port-A-Cath. The catheter is ready for immediate use. Electronically Signed   By: Sandi Mariscal M.D.   On: 10/21/2016 16:39   Ir Fluoro Guide Port Insertion Right  Result Date: 10/21/2016 INDICATION: History of hepatocellular carcinoma. In need of durable intravenous access for chemotherapy administration EXAM: IMPLANTED PORT A CATH PLACEMENT WITH ULTRASOUND AND FLUOROSCOPIC GUIDANCE COMPARISON:  None. MEDICATIONS: Ancef 2 gm IV; The antibiotic was administered within an appropriate time interval prior to skin puncture. ANESTHESIA/SEDATION: Moderate (conscious) sedation was employed during this procedure. A total of Versed 4 mg and Fentanyl 200 mcg was administered intravenously. Moderate Sedation Time: 25 minutes. The patient's level of consciousness and vital signs were monitored continuously by radiology nursing throughout the procedure under my direct supervision. CONTRAST:  None FLUOROSCOPY TIME:  18 seconds (7 mGy) COMPLICATIONS: None  immediate. PROCEDURE: The procedure, risks, benefits, and alternatives were explained to the patient. Questions regarding the procedure were encouraged and answered. The patient understands and consents to the procedure. The right neck and chest were prepped with chlorhexidine in a sterile fashion, and a sterile drape was applied covering the operative field. Maximum barrier sterile technique with sterile gowns and gloves were used for the procedure. A timeout was performed prior to the initiation of the procedure. Local anesthesia was provided with 1% lidocaine with epinephrine. After creating a small venotomy incision, a micropuncture kit was utilized to access the internal jugular vein. Real-time ultrasound guidance was utilized for vascular access including the acquisition of a permanent ultrasound image documenting patency of the accessed vessel. The microwire was utilized to measure appropriate catheter length. A subcutaneous port pocket was then created along the upper chest wall utilizing a combination of sharp and blunt dissection. The pocket was irrigated with sterile saline. A single lumen ISP power injectable port was chosen for placement. The 8 Fr catheter was tunneled from the port pocket site to the venotomy incision. The port was placed in the pocket. The external catheter was trimmed to appropriate length. At the venotomy, an 8 Fr peel-away sheath was placed over a guidewire under fluoroscopic guidance. The catheter was then placed through the sheath and the sheath was removed. Final catheter positioning was confirmed and documented with a fluoroscopic spot radiograph. The port was accessed with a Huber needle, aspirated and flushed with heparinized saline. The venotomy site was closed with an interrupted 4-0 Vicryl suture. The port pocket incision was closed with interrupted 2-0 Vicryl suture and the skin was opposed with a running subcuticular 4-0 Vicryl suture. Dermabond and Steri-strips were  applied to both incisions. Dressings were placed. The patient tolerated the procedure well without immediate post procedural complication. FINDINGS: After catheter placement, the tip lies within the superior cavoatrial junction. The catheter aspirates and flushes normally and is ready for immediate use. IMPRESSION: Successful placement of a right internal jugular approach power injectable Port-A-Cath. The catheter is ready for immediate use. Electronically Signed   By: Sandi Mariscal M.D.   On: 10/21/2016 16:39

## 2016-11-13 NOTE — Progress Notes (Signed)
After completion of feraheme infusion patient began to complain of his "chest hurting". Started normal saline free flowing and vital signs taken and stable. Sandi Mealy, NP notified and at chairside to see patient. Per Tanner, NP patient to receive 20mg  IV Pepcid, 125mg  IV Solu-medrol and patient to have EKG done. EKG taken and per Tanner, NP EKG negative. Patient states all pain has resolved and per Satira Sark, NP okay for patient to be discharged home.

## 2016-11-13 NOTE — Patient Instructions (Signed)

## 2016-12-02 NOTE — Progress Notes (Signed)
[place]  [phone] [phone]  Clinic Follow up Note   Patient Care Team: Antonietta Jewel, MD as PCP - General (Internal Medicine) [name], Cristopher Estimable, MD as Attending Physician (Gastroenterology) Antonietta Jewel, MD as Referring Physician (Internal Medicine) Truitt Merle, MD as Consulting Physician (Hematology) [name], CRNP as Nurse Practitioner (Nurse Practitioner) 12/03/2016   CHIEF COMPLAINTS:  Follow up recurrent [place]   Oncology History   Hepatocellular carcinoma ([place])   Staging form: Liver (Excluding Intrahepatic Bile Ducts), AJCC 7th Edition   - Clinical stage from 04/16/2012: Stage II (T2(m), N0, M0) - Signed by Truitt Merle, MD on 10/12/2015   - Pathologic stage from 04/16/2013: Stage II (T2, N0, cM0) - Signed by Truitt Merle, MD on 10/12/2015      Hepatocellular carcinoma ([place])   [birth year]1928 Imaging         09/14/2012 Tumor Marker    AFP 28.9      04/16/2013 Imaging    Abdominal MRI with and without contrast reviewed multifocal (4) liver lesions in both left and right lobes, most consistent with HCC, largest measuring 2.7 cm      04/16/2013 Initial Diagnosis    Hepatocellular carcinoma ([place])      04/28/2013 Procedure    Right TACE with lipiodol       06/15/2013 Procedure    Left TACE with [facility]       07/14/2013 Procedure    Right TACE with [facility]      09/05/2015 Imaging    CT abdomen with and without contrast showed a new 5.0 x 2.3 cm mass in the hepatic segment 8, invading and occluding the right anterior portal vein, most compatible with [place]. A portacaval node measuring 3.0 x 1.4 cm, previously 2.9 x 1.1 cm.      10/11/2015 Tumor Marker    AFP 5.3      11/11/2015 - 02/03/2016 Chemotherapy    sorafenib 200mg  bid started on 11/11/2015, increased to 400mg  bid in 2 weeks, stopped due to poor tolerance and probable disease progression       11/14/2015 Imaging    CT CAP 11/14/2015 IMPRESSION: Chest Impression: 1. RIGHT upper lobe  pulmonary nodule is indeterminate. No comparison available. 2. Ground-glass opacity and peripheral nodules in the RIGHT middle lobe appear post infectious or inflammatory.  Abdomen / Pelvis Impression: 1. 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 Thrombus also likely within the LEFT and RIGHT portal vein. Difficult to distinguish tumor thrombus versus bland thrombus. 2. Individual lesions are difficult to define in the RIGHT hepatic lobe. There is overall impression of progression of disease in the RIGHT hepatic lobe with multiple ill-defined enhancing lesions. 3. Periportal and shotty retroperitoneal adenopathy similar to prior.      01/25/2016 Imaging    CT Abdomen Pelvis w/ Contrast IMPRESSION: 1. Findings consistent with multifocal liver carcinoma with extensive portal vein thrombosis, and subsequent development of porta hepatis venous collaterals. Thrombus in the central portal vein is no longer expansile, but still expands the more peripheral portal vein and the right and left portal veins. There are few right liver bile ducts there are now dilated, which suggests mild progression of liver disease. 2. There is mild splenomegaly consistent or venous hypertension, which has mildly increased from the prior CT. 3. There are new lung abnormalities with interstitial thickening and ill-defined peribronchovascular nodular opacities mostly at the right lung base with a  smaller area noted along the medial left lower lobe. The etiology may be infectious. It may be inflammatory, possibly related to chemotherapy. Neoplastic disease is also possible. 4. There is no other evidence of metastatic disease within the abdomen or pelvis. No acute findings within the abdomen or pelvis      01/25/2016 - 01/25/2016 Hospital Admission    Pt was seen in ED for worsening abdominal pain, treated with pain meds        02/04/2016 -  Chemotherapy    The patient started on immunotherapy treatment nivolumab every 2 weeks; on 04/30/16 dose was reduced to 420mg  every 3 weeks       02/14/2016 Imaging    CT CAP w/ contrast 1. Slight interval increase in size of the mediastinal lymph nodes. 2. Persistent extensive lower lobe peribronchial thickening and patchy infiltrates. Possible aspiration pneumonia. 3. Persistent tree-in-bud appearance in the right middle lobe, likely chronic inflammation or atypical infection. 4. Improved CT appearance of the liver. Two small residual arterial phase enhancing lesions are identified. 5. Chronic portal vein thrombosis with cavernous transformation of portal venous collaterals. Esophageal varices are again noted. 6. Stable upper abdominal lymph nodes likely related to cirrhosis.       04/16/2016 Tumor Marker    AFP 4.1       04/17/2016 Imaging     CT CAP W PELVIS IMPRESSION: 1. Stable mild mediastinal lymphadenopathy. 2. Interval improvement in the bibasilar peribronchial thickening, airway impaction, and peribronchovascular nodularity. Imaging features suggest marked improvement in bilateral low the atypical infection. Changes in the right middle lobe with more stable and may represent scarring from previous infectious/inflammatory etiology. 3. The small hypervascular lesions seen in the anterior left liver on the previous study are not evident today. There is some subtle, ill-defined hyperenhancement in the central left liver which is nonspecific. Attention to this area on followup imaging recommended. 4. Chronic portal vein inclusion with cavernous transformation in the porta hepatis and paraesophageal varices. 5. Stable appearance hepatoduodenal and retroperitoneal lymphadenopathy, likely chronic and related to liver disease.      04/17/2016 Imaging    CT CAP CONTRAST IMPRESSION: 1. Stable mild mediastinal lymphadenopathy. 2. Interval improvement in the  bibasilar peribronchial thickening, airway impaction, and peribronchovascular nodularity. Imaging features suggest marked improvement in bilateral low the atypical infection. Changes in the right middle lobe with more stable and may represent scarring from previous infectious/inflammatory etiology. 3. The small hypervascular lesions seen in the anterior left liver on the previous study are not evident today. There is some subtle, ill-defined hyperenhancement in the central left liver which is nonspecific. Attention to this area on followup imaging recommended. 4. Chronic portal vein inclusion with cavernous transformation in the porta hepatis and paraesophageal varices. 5. Stable appearance hepatoduodenal and retroperitoneal lymphadenopathy, likely chronic and related to liver disease.      05/21/2016 Imaging    MR Abdomen W WO Contrast IMPRESSION: 1. Limited motion degraded scan. Cirrhosis. No evidence of a liver mass within these limitations. 2. Mild splenomegaly. No ascites. Stable mild paraumbilical and gastroesophageal varices. 3. Stable nonspecific mild retroperitoneal lymphadenopathy. 4. 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      06/04/2016 Tumor Marker    AFP 3.5       08/25/2016 Imaging    MR Abdomen w wo contrast  IMPRESSION: 1. Mild-to-moderate motion degradation, preferentially involving the pre and postcontrast dynamic images. On follow-up, given the extent of motion on this exam, multiphase  CT may be preferred. 2. Given these limitations, no evidence of hepatocellular carcinoma. 3. Marked cirrhosis and portal venous hypertension. 4. Slight increase in abdominal adenopathy, which is most likely reactive. Recommend attention on follow-up. 5. Cholelithiasis.       11/02/2016 Procedure    Upper endoscopy and colonoscopy  Diagnosis 1. Stomach, biopsy - MILD CHRONIC GASTRITIS. - NEGATIVE FOR HELICOBACTER  PYLORI. - NO INTESTINAL METAPLASIA, DYSPLASIA, OR MALIGNANCY. 2. Colon, polyp(s), opposite ileocecal valve - TUBULAR ADENOMA. - NO HIGH GRADE DYSPLASIA OR MALIGNANCY. 3. Rectum, polyp(s) - HYPERPLASTIC POLYP. - NO DYSPLASIA OR MALIGNANCY.       HISTORY OF PRESENTING ILLNESS (10/11/2015):  Kevin Dillon 66 y.o. male with past medical history of treated hepatitis C, liver cirrhosis, and hepatocellular carcinoma is here because of recurrent hepatocellular carcinoma. He is accompanied by his significant other [name] and her sister.  He was diagnosed with hepatocellular carcinoma in 2015, his initial image result is not available and staging is unknown. He was seen by interventional radiologist Dr. Delice Lesch at [facility] in [place] and underwent TACE procedure 3 times in 2015. He was subsequently followed, and recently repeated CT scan showed a new 5.0 cm mass in segment 8, with portal vein invasion. He was felt not to be a candidate for further liver targeted therapy, and was referred to [place] for further systemic therapy.  He complains about mid epigastric pain for the past 2 years, which significantly improved after his TACE procedure in 2015. It has been getting worse lately in the past 6 months. He states he gets about 7-8 out of 10, persistent, he has been taking oxycodone 20 mg every 6 hours, but his pain is not well controlled. He is quite fatigued, is low appetite. He last 40 lbs in the pat 4 months. He is able to function at home, in trying to remain to be physically active.  His hepatitis C was successfully treated. He has history of hepatitis C, complicated with ascites and encephalopathy, but it has been well controlled with medical management.   CURRENT THERAPY: Nivolumab 240mg  every 2 weeks, started on 02/04/2015; on 04/30/16 switched to every 3 weeks due to fatigue; change from 240mg  to 480mg  every 4 weeks starting 09/03/2016  INTERIM HISTORY:  Nesanel returns for follow up. He  presents to the clinc today accompanied by his wife. He reports he feels the same with fatigue and back spasms. Worsens when he stands or active for a long time. He has gained significant weight. He was seen by GI navigator, Dawn and he was given lasix in case his stomach was full of fluid. He was seen by GI and he had procedure with no ulcer and had his only polyp removed. His wife is concerned with his weight as this issue has been going on for several month. He is not eating a lot and occasionally takes Megace. He has not leg swelling. He take ensure boosts twice daily and does not eat on a regular basis.  He takes oxycodone 80mg  2 tabs BID and ran out a few days ago. He has not been taking potassium and he has been taking Lasix but does not feel it is helping much. His wife needs an oxygen tank in clinic and we provided one for her.     MEDICAL HISTORY:  Past Medical History:  Diagnosis Date  . Cancer ([place])    liver  . Cholelithiasis   . Chronic hepatitis C ([place])   . Chronic knee  pain   . Chronic left shoulder pain   . CVA (cerebral infarction)   . Gout   . Hepatitis C    genotype 1b.  pt has been vaccinated against hep A and B.  . Hypertension   . Osteoarthritis   . Schatzki's ring   . Tubular adenoma of colon 12/2011    SURGICAL HISTORY: Past Surgical History:  Procedure Laterality Date  . AMPUTATION Left 09/17/2012   Procedure: SMALL FINGER EXTENSOR TENDON REPAIR; METACARPAL LEVEL AMPUTATION RING FINGER; PROXIMAL PHALANX LEVEL AMPUTATION LONG FINGER;  Surgeon: Schuyler Amor, MD;  Location: [facility];  Service: Orthopedics;  Laterality: Left;  . Arm surgery     right/plate in arm  . BIOPSY  11/02/2016   Procedure: BIOPSY;  Surgeon: Daneil Dolin, MD;  [facility];  Service: Endoscopy;;  gastric   . COLONOSCOPY WITH ESOPHAGOGASTRODUODENOSCOPY (EGD)  12/30/2011   RMR: Noncritical Schatzki's ring;  Hiatal hernia, Tubular ADENOMA removed from splenic flexure,  otherwise normal colonoscopy  . COLONOSCOPY WITH PROPOFOL N/A 11/02/2016   Procedure: COLONOSCOPY WITH PROPOFOL;  Surgeon: Daneil Dolin, MD;  [facility];  Service: Endoscopy;  Laterality: N/A;  1045  . ESOPHAGOGASTRODUODENOSCOPY (EGD) WITH PROPOFOL N/A 11/02/2016   Procedure: ESOPHAGOGASTRODUODENOSCOPY (EGD) WITH PROPOFOL;  Surgeon: Daneil Dolin, MD;  [facility];  Service: Endoscopy;  Laterality: N/A;  . IR FLUORO GUIDE PORT INSERTION RIGHT  10/21/2016  . IR US GUIDE VASC ACCESS RIGHT  10/21/2016  . LEG SURGERY     left  . POLYPECTOMY  11/02/2016   Procedure: POLYPECTOMY;  Surgeon: Daneil Dolin, MD;  [facility];  Service: Endoscopy;;  colon  . WOUND EXPLORATION Left 09/17/2012   Procedure: WOUND EXPLORATION;  Surgeon: Schuyler Amor, MD;  Location: [facility];  Service: Orthopedics;  Laterality: Left;    SOCIAL HISTORY: Social History   Socioeconomic History  . Marital status: Single    Spouse name: Not on file  . Number of children: 0  . Years of education: Not on file  . Highest education level: Not on file  Social Needs  . Financial resource strain: Not on file  . Food insecurity - worry: Not on file  . Food insecurity - inability: Not on file  . Transportation needs - medical: Not on file  . Transportation needs - non-medical: Not on file  Occupational History  . Occupation: disabled  Tobacco Use  . Smoking status: Former Smoker    Packs/day: 0.50    Years: 40.00    Pack years: 20.00    Types: Cigarettes    Last attempt to quit: 01/19/2012    Years since quitting: 4.8  . Smokeless tobacco: Never Used  . Tobacco comment: Smokes 1/2 pack of cigarettes daily  Substance and Sexual Activity  . Alcohol use: No    Comment: HX 2 beers 3-4 days per week; QUIT DEC 2013  . Drug use: No    Comment: Hx cocaine yrs ago, Last marijuana OCT 2013  . Sexual activity: Not Currently    Partners: Female  Other Topics Concern  . Not on file    Social History Narrative   Lives w/ significant other, [name]    FAMILY HISTORY: Family History  Problem Relation Age of Onset  . Cirrhosis Father 41  . Cancer Father        liver cancer   . Lung cancer Mother 46  . Colon cancer Neg Hx  ALLERGIES:  has No Known Allergies.  MEDICATIONS:  Current Outpatient Medications  Medication Sig Dispense Refill  . ALPRAZolam (XANAX) 1 MG tablet Take 1 tablet (1 mg total) 3 (three) times daily as needed by mouth for anxiety. 90 tablet 0  . cyclobenzaprine (FLEXERIL) 5 MG tablet Take 1 tablet (5 mg total) by mouth 3 (three) times daily as needed for muscle spasms. 30 tablet 0  . furosemide (LASIX) 20 MG tablet Take 0.5 tablets (10 mg total) daily by mouth. 30 tablet 0  . hydrALAZINE (APRESOLINE) 25 MG tablet Take 75 mg by mouth daily.     Marland Kitchen lactulose (CHRONULAC) 10 GM/15ML solution Take 30 g by mouth daily as needed for moderate constipation.     . lidocaine-prilocaine (EMLA) cream Apply 1 application topically as needed. Apply to portacath 1 1/2 hours - 2 hours prior to procedures as needed. 30 g 1  . megestrol (MEGACE) 40 MG/ML suspension Take 400 mg by mouth daily.     . Multiple Vitamins-Minerals (CENTRUM SILVER PO) Take 1 tablet by mouth daily.     Marland Kitchen oxyCODONE (OXYCONTIN) 80 mg 12 hr tablet Take 2 tablets (160 mg total) every 12 (twelve) hours by mouth. 120 tablet 0  . oxycodone (ROXICODONE) 30 MG immediate release tablet Take 1 tablet (30 mg total) every 4 (four) hours as needed by mouth for pain. 120 tablet 0  . prochlorperazine (COMPAZINE) 10 MG tablet TAKE 1 TABLET BY MOUTH EVERY 8 HOURS AS NEEDED FOR NAUSEA/VOMITING (Patient taking differently: TAKE 10 MG BY MOUTH EVERY 8 HOURS AS NEEDED FOR NAUSEA/VOMITING) 30 tablet 1  . rivaroxaban (XARELTO) 20 MG TABS tablet TAKE 1 TABLET BY MOUTH DAILY WITH SUPPER. 30 tablet 3   No current facility-administered medications for this visit.     REVIEW OF SYSTEMS:  Constitutional: Denies  fevers, chills or abnormal night sweats  (+) weight gain Eyes: Denies blurriness of vision, double vision or watery eyes Ears, nose, mouth, throat, and face: Denies mucositis or sore throat Respiratory: Denies cough, dyspnea or wheezes Cardiovascular: Denies palpitation, chest discomfort or lower extremity swelling Gastrointestinal:  Denies nausea, heartburn or change in bowel habits,  Skin: Denies abnormal skin rashes Lymphatics: Denies new lymphadenopathy or easy bruising Neurological:Denies numbness, tingling or new weaknesses Behavioral/Psych: normal Muscuoskeletal: (+) back pain  All other systems were reviewed with the patient and are negative.  PHYSICAL EXAMINATION  ECOG PERFORMANCE STATUS: 2  Vitals:   12/03/16 1432  BP: (!) 167/61  Pulse: 83  Resp: 18  Temp: 97.9 F (36.6 C)  SpO2: 96%   Filed Weights   12/03/16 1432  Weight: 217 lb 6.4 oz (98.6 kg)     GENERAL:alert,  SKIN: skin color, texture, turgor are normal, no rashes or significant lesions EYES: normal, conjunctiva are pink and non-injected, sclera clear OROPHARYNX:no exudate, no erythema and lips, buccal mucosa, and tongue normal  NECK: supple, thyroid normal size, non-tender, without nodularity LYMPH:  no palpable lymphadenopathy in the cervical, axillary or inguinal LUNGS: clear to auscultation and percussion with normal breathing effort HEART: regular rate & rhythm and no murmurs and no lower extremity edema ABDOMEN:(+) abdominal bloating, no tenderness Musculoskeletal:no cyanosis of digits and no clubbing  PSYCH: alert & oriented x 3 with fluent speech NEURO: no focal motor/sensory deficits  LABORATORY DATA:  I have reviewed the data as listed CBC Latest Ref Rng & Units 12/03/2016 11/05/2016 10/29/2016  WBC 4.0 - 10.3 10e3/uL 9.1 7.1 8.4  Hemoglobin 13.0 - 17.1 g/dL 12.0(L)  10.0(L) 10.0(L)  Hematocrit 38.4 - 49.9 % 36.7(L) 32.1(L) 32.3(L)  Platelets 140 - 400 10e3/uL 161 131(L) 167   CMP Latest  Ref Rng & Units 12/03/2016 11/05/2016 10/29/2016  Glucose 70 - 140 mg/dl 104 124 120  BUN 7.0 - 26.0 mg/dL 24.4 17.8 18.3  Creatinine 0.7 - 1.3 mg/dL 1.5(H) 1.5(H) 1.7(H)  Sodium 136 - 145 mEq/L 138 141 140  Potassium 3.5 - 5.1 mEq/L 3.8 4.1 3.9  Chloride 101 - 111 mmol/L - - -  CO2 22 - 29 mEq/L 23 25 22   Calcium 8.4 - 10.4 mg/dL 9.5 9.3 9.5  Total Protein 6.4 - 8.3 g/dL 7.9 7.7 8.1  Total Bilirubin 0.20 - 1.20 mg/dL 0.57 0.35 0.29  Alkaline Phos 40 - 150 U/L 94 94 97  AST 5 - 34 U/L 27 25 24   ALT 0 - 55 U/L 20 14 13    AFP:  09/14/2012: 28.9 10/11/2015: 5.3 11/01/2015: 3.7 12/02/2015: 3.7 01/22/2016: 3.4 02/18/2016: 7.1 03/17/16: 5.8 04/16/16: 4.1 06/04/2016: 3.5 07/16/2016:4.4 08/27/2016: 5.6 10/08/16: 4.3 11/05/16: 4.8 12/03/16: PENDING    PATHOLOGY   Diagnosis 11/02/16  1. Stomach, biopsy - MILD CHRONIC GASTRITIS. - NEGATIVE FOR HELICOBACTER PYLORI. - NO INTESTINAL METAPLASIA, DYSPLASIA, OR MALIGNANCY. 2. Colon, polyp(s), opposite ileocecal valve - TUBULAR ADENOMA. - NO HIGH GRADE DYSPLASIA OR MALIGNANCY. 3. Rectum, polyp(s) - HYPERPLASTIC POLYP. - NO DYSPLASIA OR MALIGNANCY. Microscopic Comment 1. A Warthin-Starry stain is performed to determine the possibility of the presence of Helicobacter pylori. The Warthin-Starry stain is negative for organisms of Helicobacter pylori.   PROCEDURE  Upper Endoscopy by Dr. [name] 11/02/16  IMPRESSION:  - mildly obstructing Schatzki ring. - dilated with scope passage. erosive reflux esophagitis - Small hiatal hernia. - Portal hypertensive gastropathy. biopsied - Normal duodenal bulb and second portion of the duodenum. Biopsied.   Colonoscopy by Dr. [name] 11/02/16  IMPRESSION:  - Two 3 to 5 mm polyps in the rectum and at the ileocecal valve, removed with a cold snare. Resected and retrieved. - Diverticulosis in the sigmoid colon and in the descending colon. - The examination was otherwise normal on direct and retroflexion  views.   RADIOGRAPHIC STUDIES: I have personally reviewed the radiological images as listed and agreed with the findings in the report.  04/17/16 CT CAP W PELVIS IMPRESSION: 1. Stable mild mediastinal lymphadenopathy. 2. Interval improvement in the bibasilar peribronchial thickening, airway impaction, and peribronchovascular nodularity. Imaging features suggest marked improvement in bilateral low the atypical infection. Changes in the right middle lobe with more stable and may represent scarring from previous infectious/inflammatory etiology. 3. The small hypervascular lesions seen in the anterior left liver on the previous study are not evident today. There is some subtle, ill-defined hyperenhancement in the central left liver which is nonspecific. Attention to this area on followup imaging recommended. 4. Chronic portal vein inclusion with cavernous transformation in the porta hepatis and paraesophageal varices. 5. Stable appearance hepatoduodenal and retroperitoneal lymphadenopathy, likely chronic and related to liver disease.   MR Abdomen W WO Contrast 05/21/16 IMPRESSION: 1. Limited motion degraded scan. Cirrhosis. No evidence of a liver mass within these limitations. 2. Mild splenomegaly. No ascites. Stable mild paraumbilical and gastroesophageal varices. 3. Stable nonspecific mild retroperitoneal lymphadenopathy. 4. 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  MR Abdomen w wo contrast 08/25/2016 IMPRESSION: 1. Mild-to-moderate motion degradation, preferentially involving the pre and postcontrast dynamic images. On follow-up, given the extent of motion on this exam, multiphase CT may  be preferred. 2. Given these limitations, no evidence of hepatocellular carcinoma. 3. Marked cirrhosis and portal venous hypertension. 4. Slight increase in abdominal adenopathy, which is most likely reactive. Recommend attention on  follow-up. 5. Cholelithiasis.   ASSESSMENT & PLAN: 66 y.o. Caucasian male with past medical history of successfully treated hepatitis C, liver cirrhosis, history of ascites and encephalopathy, recurrent HCC  1. Recurrent HCC, initially stage II -I previously reviewed his previous image and outside medical records extensively, confirmed key findings with patient and his family members -He initially had a multifocal disease, status post TACE 3 times in 2015 -he developed symptomatic local recurrence in 08/2015, with a 5cm new lesion in segment 8 with direct invasion into portal vein, and a portocaval lymph node. He is not a candidate for liver targeted therapy per his IR Dr. Delice Lesch -He was started on first line srafenib, tolerated poorly. His previous CT abdomen and pelvis on 01/25/2016 revealed extensive portal hypertension, and a probable cancer progression in the liver. -His treatment has changed to immunotherapy Nivolumab, he tolerated very well, and clinically he is doing much better, with less pain and better energy. Continue.  -I previously discussed his restaging CT scan which was done on 02/14/2016, it actually showed decreased liver cancer size compared to the scan a month ago. No other new metastasis. -restaging CT from 04/17/2016 showed no visible liver mass, but the imaging was very limited due to his liver cirrhosis  -I previously reviewed his recent abdominal MRI from 05/21/2016, which showed no visible mass in the liver. He has had complete radiographic response. -I reviewed his restaging MRI from 08/25/2016, which showed no visible liver mass, but the images quality with significant compromised due to the motion.  -Nivo has previously been changed to 480mg  every 4 weeks, he is tolerating well  -  His thyroid function is low at times. I previously referred him to endocrinologist, Dr. Loanne Drilling  -He has his [place] reviewed, his potassium is normal at 3.8, Cr is 1.5 and mildly anemic  with Hg at 12.0. I suggest he reduce his Lasix to half tablets. Labs adequate to proceed with Nivolumab.  -Repeat Scans in 3 weeks -F/u in 4 weeks    2. Abdominal pain/ back pain -Secondary to [facility] and portal vein thrombosis -overall better now with oxycodone and OxyContin, he is on very high dose, overall pain is controlled  -I previously recommended him to see pain management clinic, he declined. - I previously prescribed flexeril in attempt to help with his pain. Due to his high tolerance, he can take up to 2 a day -The patient previously stated his pain was not well controlled, I increased his OxyContin from 80 mg every 8 hours to 160 mg every 12 hours, pain is better controlled now  -He was previously taking 6 Oxycodone tablets daily. I encouraged him to cut back to 5 daily instead, he will try. -OxyContin 2 tabs BID, oxycodone, and flexeril on board for symptom management -will continue to monitor, refilled oxycodone and OxyContin 80mg  today (12/03/16)  3. 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 Since improving I suggest he reduce down to half tablet or once every other day.  -he will continue to follow up with Dawn   5. Hep C, successfully treated -Continue follow-up with liver clinic NP Dawn  6. Anxiety and insomnia  -He has Xanax for anxiety. I previously refilled Xanax 1 mg. -continue Ativan 1 mg at bedtime as needed for insomnia, he knows not to take with Xanax  together, he knows not to take during the day. He does not feel Ativan is working well for sleep -He is taking xanax for insomnia, refilled today (12/03/16)   7. Fatigue and weight gain -He reports more fatigue and weak and lately -His TSH has been moderately elevated lately, we discussed the side effect of hypothyroidism from Nivolumab, I previously referred him to Dr. Loanne Drilling for evaluation to see if he needs low-dose Synthroid -He continues to gain weight and his wife is concerned. -He does not eat regularly and eats 2 ensure boosts daily along with Megace as needed.  -I do not suspect his weight gain is related to fluid retention. I suggest he hold Megace and reduce ensure boosts to once daily. He should use a balanced diet and exercise to help.   8. HTN -He is on Hydralazine 50 mg daily, and follow-up of his primary care physician  9. Smoking - Patient has started back smoking due to the fear of not getting better - I previously recommended a smoking cessation program. He refused.  10. Arthralgia -Patient has developed significant arthralgia, possible related to Nivolumab. It has much improved this week. Exam was unremarkable. - Patient takes OxyContin to help and oxycodone prn - I advised he can take up to ibuprofen TID - If pain worsens, I may try a short course of steroid  11. Depression - The patient reports feelings of depression related to his diagnosis. - He reports fears he is going to die soon. - I previously encouraged the patient that his recent scan shows an excellent response to treatment.  12. Iron deficient anemia -Her hemoglobin dropped to 7.3 as of 10/16/16, from 9.4 5 weeks ago  -She is on Xarelto, no clinical overt bleeding, I'll check her iron study, haptoglobin, LDH, to ruled out hemostasis also. -Repeated CBC showed hemoglobin 6.9, I Arranged 2 units of RBC transfusion that week.  -I previously contacted his GI Dr. [name] to request EGD/colonoscopy ASAP, for  possible GI bleeding  -He received IV feraheme 10/16/16 and in 10/2016 -He had GI work up with upper endoscopy and colonoscopy per Dr. [name] on 11/02/16. Biopsy of the stomach showed mild chronic gastritis, negative for H. Pylori or malignancy. Colon and rectal polyps were negative for dysplasia or malignancy. He will f/u with GI again in 01/2017.  -anemia has improved, Hg 12.0 today  -if he develops anemia or GI bleeding again, will stop Xarelto   13. Goal of care discussion, DNR/DNI  -We previously discussed the incurable nature of his cancer, and the overall poor prognosis, especially if he does not have good response to chemotherapy or progress on chemo -The patient understands the goal of care is palliative. -he agrees with DNR/DNI    Plan  - Reduce Ensure boost to once daily and hold megace, due to his recent weight gain  -Refill Xanax, Lasix at half tablets daily, Xarelto, oxycodone and OxyContin 80mg  today  -Continue with  Nivolumab infusion today -f/u and Nivolumab in 4 weeks  -Lab, flush, and CT chest, Abdominal MRI one week before next visit.  -Provided oxygen tank to his wife in clinic because she was low on hers   All questions were answered. The patient knows to call the clinic with any problems, questions or concerns.  I spent 30 minutes counseling the patient face to face. The total time spent in the appointment was 40 minutes and more than 50% was on counseling.  This document serves as a record of services personally performed by Truitt Merle, MD. It was created on her behalf by Joslyn Devon, a trained medical scribe. The creation of this record is based on the scribe's personal observations and the provider's statements to them.    I have reviewed the above documentation for accuracy and completeness, and I agree with the above.     Truitt Merle  12/03/2016

## 2016-12-03 ENCOUNTER — Telehealth: Payer: Self-pay | Admitting: Hematology

## 2016-12-03 ENCOUNTER — Ambulatory Visit (HOSPITAL_BASED_OUTPATIENT_CLINIC_OR_DEPARTMENT_OTHER): Payer: Medicare Other

## 2016-12-03 ENCOUNTER — Ambulatory Visit (HOSPITAL_BASED_OUTPATIENT_CLINIC_OR_DEPARTMENT_OTHER): Payer: Medicare Other | Admitting: Hematology

## 2016-12-03 ENCOUNTER — Ambulatory Visit: Payer: Medicare Other

## 2016-12-03 ENCOUNTER — Other Ambulatory Visit (HOSPITAL_BASED_OUTPATIENT_CLINIC_OR_DEPARTMENT_OTHER): Payer: Medicare Other

## 2016-12-03 VITALS — BP 167/61 | HR 83 | Temp 97.9°F | Resp 18 | Ht 73.0 in | Wt 217.4 lb

## 2016-12-03 DIAGNOSIS — D500 Iron deficiency anemia secondary to blood loss (chronic): Secondary | ICD-10-CM

## 2016-12-03 DIAGNOSIS — Z720 Tobacco use: Secondary | ICD-10-CM | POA: Diagnosis not present

## 2016-12-03 DIAGNOSIS — D6481 Anemia due to antineoplastic chemotherapy: Secondary | ICD-10-CM

## 2016-12-03 DIAGNOSIS — G4700 Insomnia, unspecified: Secondary | ICD-10-CM | POA: Diagnosis not present

## 2016-12-03 DIAGNOSIS — Z86718 Personal history of other venous thrombosis and embolism: Secondary | ICD-10-CM | POA: Diagnosis not present

## 2016-12-03 DIAGNOSIS — Z5112 Encounter for antineoplastic immunotherapy: Secondary | ICD-10-CM | POA: Diagnosis present

## 2016-12-03 DIAGNOSIS — R530 Neoplastic (malignant) related fatigue: Secondary | ICD-10-CM

## 2016-12-03 DIAGNOSIS — K7469 Other cirrhosis of liver: Secondary | ICD-10-CM

## 2016-12-03 DIAGNOSIS — F419 Anxiety disorder, unspecified: Secondary | ICD-10-CM | POA: Diagnosis not present

## 2016-12-03 DIAGNOSIS — C220 Liver cell carcinoma: Secondary | ICD-10-CM

## 2016-12-03 DIAGNOSIS — G893 Neoplasm related pain (acute) (chronic): Secondary | ICD-10-CM | POA: Diagnosis not present

## 2016-12-03 DIAGNOSIS — Z8619 Personal history of other infectious and parasitic diseases: Secondary | ICD-10-CM

## 2016-12-03 DIAGNOSIS — R635 Abnormal weight gain: Secondary | ICD-10-CM

## 2016-12-03 DIAGNOSIS — Z7901 Long term (current) use of anticoagulants: Secondary | ICD-10-CM | POA: Diagnosis not present

## 2016-12-03 DIAGNOSIS — T451X5A Adverse effect of antineoplastic and immunosuppressive drugs, initial encounter: Secondary | ICD-10-CM

## 2016-12-03 LAB — CBC WITH DIFFERENTIAL/PLATELET
BASO%: 0.4 % (ref 0.0–2.0)
Basophils Absolute: 0 10*3/uL (ref 0.0–0.1)
EOS%: 1.9 % (ref 0.0–7.0)
Eosinophils Absolute: 0.2 10*3/uL (ref 0.0–0.5)
HCT: 36.7 % — ABNORMAL LOW (ref 38.4–49.9)
HGB: 12 g/dL — ABNORMAL LOW (ref 13.0–17.1)
LYMPH%: 25.5 % (ref 14.0–49.0)
MCH: 27.5 pg (ref 27.2–33.4)
MCHC: 32.7 g/dL (ref 32.0–36.0)
MCV: 84.2 fL (ref 79.3–98.0)
MONO#: 1.1 10*3/uL — ABNORMAL HIGH (ref 0.1–0.9)
MONO%: 11.7 % (ref 0.0–14.0)
NEUT%: 60.5 % (ref 39.0–75.0)
NEUTROS ABS: 5.5 10*3/uL (ref 1.5–6.5)
Platelets: 161 10*3/uL (ref 140–400)
RBC: 4.36 10*6/uL (ref 4.20–5.82)
RDW: 22.5 % — ABNORMAL HIGH (ref 11.0–14.6)
WBC: 9.1 10*3/uL (ref 4.0–10.3)
lymph#: 2.3 10*3/uL (ref 0.9–3.3)

## 2016-12-03 LAB — COMPREHENSIVE METABOLIC PANEL
ALBUMIN: 4 g/dL (ref 3.5–5.0)
ALK PHOS: 94 U/L (ref 40–150)
ALT: 20 U/L (ref 0–55)
ANION GAP: 10 meq/L (ref 3–11)
AST: 27 U/L (ref 5–34)
BUN: 24.4 mg/dL (ref 7.0–26.0)
CALCIUM: 9.5 mg/dL (ref 8.4–10.4)
CO2: 23 mEq/L (ref 22–29)
CREATININE: 1.5 mg/dL — AB (ref 0.7–1.3)
Chloride: 105 mEq/L (ref 98–109)
EGFR: 48 mL/min/{1.73_m2} — AB (ref >60)
Glucose: 104 mg/dl (ref 70–140)
POTASSIUM: 3.8 meq/L (ref 3.5–5.1)
SODIUM: 138 meq/L (ref 136–145)
TOTAL PROTEIN: 7.9 g/dL (ref 6.4–8.3)
Total Bilirubin: 0.57 mg/dL (ref 0.20–1.20)

## 2016-12-03 MED ORDER — SODIUM CHLORIDE 0.9% FLUSH
10.0000 mL | Freq: Once | INTRAVENOUS | Status: AC
  Administered 2016-12-03: 10 mL
  Filled 2016-12-03: qty 10

## 2016-12-03 MED ORDER — SODIUM CHLORIDE 0.9 % IV SOLN
Freq: Once | INTRAVENOUS | Status: AC
  Administered 2016-12-03: 15:00:00 via INTRAVENOUS

## 2016-12-03 MED ORDER — SODIUM CHLORIDE 0.9% FLUSH
10.0000 mL | INTRAVENOUS | Status: DC | PRN
  Administered 2016-12-03: 10 mL
  Filled 2016-12-03: qty 10

## 2016-12-03 MED ORDER — OXYCODONE HCL 30 MG PO TABS: 30.0000 mg | ORAL_TABLET | ORAL | 0 refills | Status: DC | PRN

## 2016-12-03 MED ORDER — OXYCODONE HCL ER 80 MG PO T12A: 160.0000 mg | EXTENDED_RELEASE_TABLET | Freq: Two times a day (BID) | ORAL | 0 refills | Status: DC

## 2016-12-03 MED ORDER — ALPRAZOLAM 1 MG PO TABS: 1.0000 mg | ORAL_TABLET | Freq: Three times a day (TID) | ORAL | 0 refills | Status: DC | PRN

## 2016-12-03 MED ORDER — RIVAROXABAN 20 MG PO TABS: ORAL_TABLET | ORAL | 3 refills | Status: DC

## 2016-12-03 MED ORDER — FUROSEMIDE 20 MG PO TABS: 10.0000 mg | ORAL_TABLET | Freq: Every day | ORAL | 0 refills | Status: DC

## 2016-12-03 MED ORDER — SODIUM CHLORIDE 0.9 % IV SOLN
480.0000 mg | Freq: Once | INTRAVENOUS | Status: AC
  Administered 2016-12-03: 480 mg via INTRAVENOUS
  Filled 2016-12-03: qty 48

## 2016-12-03 MED ORDER — HEPARIN SOD (PORK) LOCK FLUSH 100 UNIT/ML IV SOLN
500.0000 [IU] | Freq: Once | INTRAVENOUS | Status: AC | PRN
  Administered 2016-12-03: 500 [IU]
  Filled 2016-12-03: qty 5

## 2016-12-03 MED FILL — XARELTO 20 MG TABLET: 20 | 30 days supply | Qty: 30 | Fill #0

## 2016-12-03 MED FILL — FUROSEMIDE 20 MG TABLET: 20 | 60 days supply | Qty: 30 | Fill #0

## 2016-12-03 MED FILL — oxyCODONE HCL 30 MG TABS: 30 | 20 days supply | Qty: 120 | Fill #0

## 2016-12-03 NOTE — Patient Instructions (Signed)
[facility] Discharge Instructions for Patients Receiving Chemotherapy  Today you received the following chemotherapy agents Opdivo.  To help prevent nausea and vomiting after your treatment, we encourage you to take your nausea medication as directed.   If you develop nausea and vomiting that is not controlled by your nausea medication, call the clinic.   BELOW ARE SYMPTOMS THAT SHOULD BE REPORTED IMMEDIATELY:  *FEVER GREATER THAN 100.5 F  *CHILLS WITH OR WITHOUT FEVER  NAUSEA AND VOMITING THAT IS NOT CONTROLLED WITH YOUR NAUSEA MEDICATION  *UNUSUAL SHORTNESS OF BREATH  *UNUSUAL BRUISING OR BLEEDING  TENDERNESS IN MOUTH AND THROAT WITH OR WITHOUT PRESENCE OF ULCERS  *URINARY PROBLEMS  *BOWEL PROBLEMS  UNUSUAL RASH Items with * indicate a potential emergency and should be followed up as soon as possible.  Feel free to call the clinic you have any questions or concerns. The clinic phone number is [phone].  Please show the CHEMO ALERT CARD at check-in to the Emergency Department and triage nurse.    

## 2016-12-03 NOTE — Telephone Encounter (Signed)
Gave avs and calendar for December  °

## 2016-12-04 ENCOUNTER — Encounter: Payer: Self-pay | Admitting: Hematology

## 2016-12-04 LAB — AFP TUMOR MARKER: AFP, SERUM, TUMOR MARKER: 5.4 ng/mL (ref 0.0–8.3)

## 2016-12-09 MED FILL — ALPRAZolam 1 MG TABS: 1 | 30 days supply | Qty: 90 | Fill #0

## 2016-12-09 MED FILL — OxyCONTIN 80 MG T12A: 80 | 30 days supply | Qty: 120 | Fill #0

## 2016-12-24 ENCOUNTER — Other Ambulatory Visit: Payer: Self-pay | Admitting: *Deleted

## 2016-12-24 ENCOUNTER — Other Ambulatory Visit (HOSPITAL_BASED_OUTPATIENT_CLINIC_OR_DEPARTMENT_OTHER): Payer: Medicare Other

## 2016-12-24 ENCOUNTER — Ambulatory Visit (HOSPITAL_BASED_OUTPATIENT_CLINIC_OR_DEPARTMENT_OTHER): Payer: Medicare Other

## 2016-12-24 ENCOUNTER — Telehealth: Payer: Self-pay | Admitting: *Deleted

## 2016-12-24 DIAGNOSIS — C220 Liver cell carcinoma: Secondary | ICD-10-CM | POA: Diagnosis not present

## 2016-12-24 DIAGNOSIS — D126 Benign neoplasm of colon, unspecified: Secondary | ICD-10-CM

## 2016-12-24 DIAGNOSIS — T451X5A Adverse effect of antineoplastic and immunosuppressive drugs, initial encounter: Secondary | ICD-10-CM

## 2016-12-24 DIAGNOSIS — D6481 Anemia due to antineoplastic chemotherapy: Secondary | ICD-10-CM

## 2016-12-24 DIAGNOSIS — G893 Neoplasm related pain (acute) (chronic): Secondary | ICD-10-CM

## 2016-12-24 LAB — COMPREHENSIVE METABOLIC PANEL
ALT: 13 U/L (ref 0–55)
ANION GAP: 10 meq/L (ref 3–11)
AST: 27 U/L (ref 5–34)
Albumin: 3.6 g/dL (ref 3.5–5.0)
Alkaline Phosphatase: 97 U/L (ref 40–150)
BUN: 10.2 mg/dL (ref 7.0–26.0)
CALCIUM: 9.2 mg/dL (ref 8.4–10.4)
CHLORIDE: 106 meq/L (ref 98–109)
CO2: 22 mEq/L (ref 22–29)
Creatinine: 1.2 mg/dL (ref 0.7–1.3)
EGFR: >60 mL/min/{1.73_m2} (ref >60)
Glucose: 106 mg/dl (ref 70–140)
POTASSIUM: 3.7 meq/L (ref 3.5–5.1)
Sodium: 138 mEq/L (ref 136–145)
Total Bilirubin: 0.46 mg/dL (ref 0.20–1.20)
Total Protein: 7.1 g/dL (ref 6.4–8.3)

## 2016-12-24 LAB — CBC WITH DIFFERENTIAL/PLATELET
BASO%: 0.6 % (ref 0.0–2.0)
Basophils Absolute: 0 10*3/uL (ref 0.0–0.1)
EOS ABS: 0.2 10*3/uL (ref 0.0–0.5)
EOS%: 2.6 % (ref 0.0–7.0)
HEMATOCRIT: 32.9 % — AB (ref 38.4–49.9)
HGB: 11 g/dL — ABNORMAL LOW (ref 13.0–17.1)
LYMPH%: 30.2 % (ref 14.0–49.0)
MCH: 27.6 pg (ref 27.2–33.4)
MCHC: 33.4 g/dL (ref 32.0–36.0)
MCV: 82.7 fL (ref 79.3–98.0)
MONO#: 0.7 10*3/uL (ref 0.1–0.9)
MONO%: 9 % (ref 0.0–14.0)
NEUT%: 57.6 % (ref 39.0–75.0)
NEUTROS ABS: 4.2 10*3/uL (ref 1.5–6.5)
Platelets: 205 10*3/uL (ref 140–400)
RBC: 3.98 10*6/uL — ABNORMAL LOW (ref 4.20–5.82)
RDW: 19 % — AB (ref 11.0–14.6)
WBC: 7.3 10*3/uL (ref 4.0–10.3)
lymph#: 2.2 10*3/uL (ref 0.9–3.3)
nRBC: 0 % (ref 0–0)

## 2016-12-24 MED ORDER — SODIUM CHLORIDE 0.9% FLUSH
10.0000 mL | INTRAVENOUS | Status: DC | PRN
  Administered 2016-12-24: 10 mL via INTRAVENOUS
  Filled 2016-12-24: qty 10

## 2016-12-24 MED ORDER — HEPARIN SOD (PORK) LOCK FLUSH 100 UNIT/ML IV SOLN
500.0000 [IU] | Freq: Once | INTRAVENOUS | Status: DC
  Administered 2016-12-24: 500 [IU] via INTRAVENOUS
  Filled 2016-12-24: qty 5

## 2016-12-24 MED ORDER — OXYCODONE HCL 30 MG PO TABS: 30.0000 mg | ORAL_TABLET | ORAL | 0 refills | Status: DC | PRN

## 2016-12-24 MED FILL — oxyCODONE HCL 30 MG TABS: 30 | 20 days supply | Qty: 120 | Fill #0

## 2016-12-24 NOTE — Patient Instructions (Signed)

## 2016-12-24 NOTE — Telephone Encounter (Signed)
Pt came as walk-in requesting refill of Oxycodone 30 mg.   Dr. Burr Medico notified.  Script given to pt in the lobby.

## 2016-12-30 ENCOUNTER — Ambulatory Visit (HOSPITAL_COMMUNITY): Payer: Medicare Other

## 2016-12-30 ENCOUNTER — Ambulatory Visit (HOSPITAL_COMMUNITY): Admission: RE | Admit: 2016-12-30 | Payer: Medicare Other | Source: Ambulatory Visit

## 2016-12-31 ENCOUNTER — Ambulatory Visit: Payer: Medicare Other

## 2016-12-31 ENCOUNTER — Ambulatory Visit: Payer: Medicare Other | Admitting: Hematology

## 2016-12-31 NOTE — Progress Notes (Signed)
[facility]  [phone] [phone]  Clinic Follow up Note   Patient Care Team: Kevin Jewel, MD as PCP - General (Internal Medicine) Kevin Dillon, Kevin Estimable, MD as Attending Physician (Gastroenterology) Kevin Jewel, MD as Referring Physician (Internal Medicine) Kevin Merle, MD as Consulting Physician (Hematology) Kevin Dillon, CRNP as Nurse Practitioner (Nurse Practitioner)   Date of Service:  01/01/2017   CHIEF COMPLAINTS:  Follow up recurrent [name]   Oncology History   Hepatocellular carcinoma ([place])   Staging form: Liver (Excluding Intrahepatic Bile Ducts), AJCC 7th Edition   - Clinical stage from 04/16/2012: Stage II (T2(m), N0, M0) - Signed by Kevin Merle, MD on 10/12/2015   - Pathologic stage from 04/16/2013: Stage II (T2, N0, cM0) - Signed by Kevin Merle, MD on 10/12/2015      Hepatocellular carcinoma ([place])   [birth year]28 Imaging         09/14/2012 Tumor Marker    AFP 28.9      04/16/2013 Imaging    Abdominal MRI with and without contrast reviewed multifocal (4) liver lesions in both left and right lobes, most consistent with HCC, largest measuring 2.7 cm      04/16/2013 Initial Diagnosis    Hepatocellular carcinoma ([place])      04/28/2013 Procedure    Right TACE with lipiodol       06/15/2013 Procedure    Left TACE with [facility]       07/14/2013 Procedure    Right [facility]ACE with [facility]      09/05/2015 Imaging    CT abdomen with and without contrast showed a new 5.0 x 2.3 cm mass in the hepatic segment 8, invading and occluding the right anterior portal vein, most compatible with [place]. A portacaval node measuring 3.0 x 1.4 cm, previously 2.9 x 1.1 cm.      10/11/2015 Tumor Marker    AFP 5.3      11/11/2015 - 02/03/2016 Chemotherapy    sorafenib 200mg  bid started on 11/11/2015, increased to 400mg  bid in 2 weeks, stopped due to poor tolerance and probable disease progression       11/14/2015 Imaging    CT CAP 11/14/2015 IMPRESSION: Chest Impression: 1.  RIGHT upper lobe pulmonary nodule is indeterminate. No comparison available. 2. Ground-glass opacity and peripheral nodules in the RIGHT middle lobe appear post infectious or inflammatory.  Abdomen / Pelvis Impression: 1. 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 Thrombus also likely within the LEFT and RIGHT portal vein. Difficult to distinguish tumor thrombus versus bland thrombus. 2. Individual lesions are difficult to define in the RIGHT hepatic lobe. There is overall impression of progression of disease in the RIGHT hepatic lobe with multiple ill-defined enhancing lesions. 3. Periportal and shotty retroperitoneal adenopathy similar to prior.      01/25/2016 Imaging    CT Abdomen Pelvis w/ Contrast IMPRESSION: 1. Findings consistent with multifocal liver carcinoma with extensive portal vein thrombosis, and subsequent development of porta hepatis venous collaterals. Thrombus in the central portal vein is no longer expansile, but still expands the more peripheral portal vein and the right and left portal veins. There are few right liver bile ducts there are now dilated, which suggests mild progression of liver disease. 2. There is mild splenomegaly consistent or venous hypertension, which has mildly increased from the prior CT. 3. There are new lung abnormalities with interstitial thickening and ill-defined peribronchovascular nodular opacities mostly at  the right lung base with a smaller area noted along the medial left lower lobe. The etiology may be infectious. It may be inflammatory, possibly related to chemotherapy. Neoplastic disease is also possible. 4. There is no other evidence of metastatic disease within the abdomen or pelvis. No acute findings within the abdomen or pelvis      01/25/2016 - 01/25/2016 Hospital Admission    Pt was seen in ED for worsening abdominal pain, treated  with pain meds       02/04/2016 -  Chemotherapy    The patient started on immunotherapy treatment nivolumab every 2 weeks; on 04/30/16 dose was reduced to 420mg  every 3 weeks       02/14/2016 Imaging    CT CAP w/ contrast 1. Slight interval increase in size of the mediastinal lymph nodes. 2. Persistent extensive lower lobe peribronchial thickening and patchy infiltrates. Possible aspiration pneumonia. 3. Persistent tree-in-bud appearance in the right middle lobe, likely chronic inflammation or atypical infection. 4. Improved CT appearance of the liver. Two small residual arterial phase enhancing lesions are identified. 5. Chronic portal vein thrombosis with cavernous transformation of portal venous collaterals. Esophageal varices are again noted. 6. Stable upper abdominal lymph nodes likely related to cirrhosis.       04/16/2016 Tumor Marker    AFP 4.1       04/17/2016 Imaging     CT CAP W PELVIS IMPRESSION: 1. Stable mild mediastinal lymphadenopathy. 2. Interval improvement in the bibasilar peribronchial thickening, airway impaction, and peribronchovascular nodularity. Imaging features suggest marked improvement in bilateral low the atypical infection. Changes in the right middle lobe with more stable and may represent scarring from previous infectious/inflammatory etiology. 3. The small hypervascular lesions seen in the anterior left liver on the previous study are not evident today. There is some subtle, ill-defined hyperenhancement in the central left liver which is nonspecific. Attention to this area on followup imaging recommended. 4. Chronic portal vein inclusion with cavernous transformation in the porta hepatis and paraesophageal varices. 5. Stable appearance hepatoduodenal and retroperitoneal lymphadenopathy, likely chronic and related to liver disease.      04/17/2016 Imaging    CT CAP CONTRAST IMPRESSION: 1. Stable mild mediastinal lymphadenopathy. 2. Interval  improvement in the bibasilar peribronchial thickening, airway impaction, and peribronchovascular nodularity. Imaging features suggest marked improvement in bilateral low the atypical infection. Changes in the right middle lobe with more stable and may represent scarring from previous infectious/inflammatory etiology. 3. The small hypervascular lesions seen in the anterior left liver on the previous study are not evident today. There is some subtle, ill-defined hyperenhancement in the central left liver which is nonspecific. Attention to this area on followup imaging recommended. 4. Chronic portal vein inclusion with cavernous transformation in the porta hepatis and paraesophageal varices. 5. Stable appearance hepatoduodenal and retroperitoneal lymphadenopathy, likely chronic and related to liver disease.      05/21/2016 Imaging    MR Abdomen W WO Contrast IMPRESSION: 1. Limited motion degraded scan. Cirrhosis. No evidence of a liver mass within these limitations. 2. Mild splenomegaly. No ascites. Stable mild paraumbilical and gastroesophageal varices. 3. Stable nonspecific mild retroperitoneal lymphadenopathy. 4. 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      06/04/2016 Tumor Marker    AFP 3.5       08/25/2016 Imaging    MR Abdomen w wo contrast  IMPRESSION: 1. Mild-to-moderate motion degradation, preferentially involving the pre and postcontrast dynamic images. On follow-up, given the extent  of motion on this exam, multiphase CT may be preferred. 2. Given these limitations, no evidence of hepatocellular carcinoma. 3. Marked cirrhosis and portal venous hypertension. 4. Slight increase in abdominal adenopathy, which is most likely reactive. Recommend attention on follow-up. 5. Cholelithiasis.       11/02/2016 Procedure    Upper endoscopy and colonoscopy  Diagnosis 1. Stomach, biopsy - MILD CHRONIC GASTRITIS. - NEGATIVE FOR  HELICOBACTER PYLORI. - NO INTESTINAL METAPLASIA, DYSPLASIA, OR MALIGNANCY. 2. Colon, polyp(s), opposite ileocecal valve - TUBULAR ADENOMA. - NO HIGH GRADE DYSPLASIA OR MALIGNANCY. 3. Rectum, polyp(s) - HYPERPLASTIC POLYP. - NO DYSPLASIA OR MALIGNANCY.       HISTORY OF PRESENTING ILLNESS (10/11/2015):  Kevin Dillon 66 y.o. Dillon with past medical history of treated hepatitis C, liver cirrhosis, and hepatocellular carcinoma is here because of recurrent hepatocellular carcinoma. He is accompanied by his significant other [name] and her sister.  He was diagnosed with hepatocellular carcinoma in 2015, his initial image result is not available and staging is unknown. He was seen by interventional radiologist Dr. Delice Lesch at [facility] in [place] and underwent TACE procedure 3 times in 2015. He was subsequently followed, and recently repeated CT scan showed a new 5.0 cm mass in segment 8, with portal vein invasion. He was felt not to be a candidate for further liver targeted therapy, and was referred to [place] for further systemic therapy.  He complains about mid epigastric pain for the past 2 years, which significantly improved after his TACE procedure in 2015. It has been getting worse lately in the past 6 months. He states he gets about 7-8 out of 10, persistent, he has been taking oxycodone 20 mg every 6 hours, but his pain is not well controlled. He is quite fatigued, is low appetite. He last 40 lbs in the pat 4 months. He is able to function at home, in trying to remain to be physically active.  His hepatitis C was successfully treated. He has history of hepatitis C, complicated with ascites and encephalopathy, but it has been well controlled with medical management.   CURRENT THERAPY: Nivolumab 240mg  every 2 weeks, started on 02/04/2016; on 04/30/16 switched to every 3 weeks due to fatigue; change from 240mg  to 480mg  every 4 weeks starting 09/03/2016. Given his poor toleration, will reduce him to  every 2 weeks at 240mg  starting 01/02/17   INTERIM HISTORY:   Kevin Dillon returns for follow up. He presents to the clinic today noting he got stuck in black ice 2 days ago and could not come in for treatment. He notes he also missed his scan and has not rescheduled. He plans to come in for treatment tomorrow. He notes last treatment he felt he could not get out of his bed. He thinks he took more pills than he was supposed to. There was significant muscle cramps in his legs. This lasted for 1.5 weeks. He almost called for an ambulance. He is will to do every 2 weeks at a lower dose. He had EUS on 11/02/16, benign polyps were removed. Given his pain he would like a refill of OxyContin. He takes oxycodone as needed.     MEDICAL HISTORY:  Past Medical History:  Diagnosis Date  . Cancer ([place])    liver  . Cholelithiasis   . Chronic hepatitis C ([place])   . Chronic knee pain   . Chronic left shoulder pain   . CVA (cerebral infarction)   . Gout   . Hepatitis C  genotype 1b.  pt has been vaccinated against hep A and B.  . Hypertension   . Osteoarthritis   . Schatzki's ring   . Tubular adenoma of colon 12/2011    SURGICAL HISTORY: Past Surgical History:  Procedure Laterality Date  . AMPUTATION Left 09/17/2012   Procedure: SMALL FINGER EXTENSOR TENDON REPAIR; METACARPAL LEVEL AMPUTATION RING FINGER; PROXIMAL PHALANX LEVEL AMPUTATION LONG FINGER;  Surgeon: Schuyler Amor, MD;  Location: [facility];  Service: Orthopedics;  Laterality: Left;  . Arm surgery     right/plate in arm  . BIOPSY  11/02/2016   Procedure: BIOPSY;  Surgeon: Daneil Dolin, MD;  [facility];  Service: Endoscopy;;  gastric   . COLONOSCOPY WITH ESOPHAGOGASTRODUODENOSCOPY (EGD)  12/30/2011   RMR: Noncritical Schatzki's ring;  Hiatal hernia, Tubular ADENOMA removed from splenic flexure, otherwise normal colonoscopy  . COLONOSCOPY WITH PROPOFOL N/A 11/02/2016   Procedure: COLONOSCOPY WITH PROPOFOL;  Surgeon: Daneil Dolin, MD;  [facility];  Service: Endoscopy;  Laterality: N/A;  1045  . ESOPHAGOGASTRODUODENOSCOPY (EGD) WITH PROPOFOL N/A 11/02/2016   Procedure: ESOPHAGOGASTRODUODENOSCOPY (EGD) WITH PROPOFOL;  Surgeon: Daneil Dolin, MD;  [facility];  Service: Endoscopy;  Laterality: N/A;  . IR FLUORO GUIDE PORT INSERTION RIGHT  10/21/2016  . IR US GUIDE VASC ACCESS RIGHT  10/21/2016  . LEG SURGERY     left  . POLYPECTOMY  11/02/2016   Procedure: POLYPECTOMY;  Surgeon: Daneil Dolin, MD;  [facility];  Service: Endoscopy;;  colon  . WOUND EXPLORATION Left 09/17/2012   Procedure: WOUND EXPLORATION;  Surgeon: Schuyler Amor, MD;  Location: [facility];  Service: Orthopedics;  Laterality: Left;    SOCIAL HISTORY: Social History   Socioeconomic History  . Marital status: Single    Spouse name: Not on file  . Number of children: 0  . Years of education: Not on file  . Highest education level: Not on file  Social Needs  . Financial resource strain: Not on file  . Food insecurity - worry: Not on file  . Food insecurity - inability: Not on file  . Transportation needs - medical: Not on file  . Transportation needs - non-medical: Not on file  Occupational History  . Occupation: disabled  Tobacco Use  . Smoking status: Former Smoker    Packs/day: 0.50    Years: 40.00    Pack years: 20.00    Types: Cigarettes    Last attempt to quit: 01/19/2012    Years since quitting: 4.9  . Smokeless tobacco: Never Used  . Tobacco comment: Smokes 1/2 pack of cigarettes daily  Substance and Sexual Activity  . Alcohol use: No    Comment: HX 2 beers 3-4 days per week; QUIT DEC 2013  . Drug use: No    Comment: Hx cocaine yrs ago, Last marijuana OCT 2013  . Sexual activity: Not Currently    Partners: Female  Other Topics Concern  . Not on file  Social History Narrative   Lives w/ significant other, [name]    FAMILY HISTORY: Family History  Problem Relation  Age of Onset  . Cirrhosis Father 34  . Cancer Father        liver cancer   . Lung cancer Mother 13  . Colon cancer Neg Hx     ALLERGIES:  has No Known Allergies.  MEDICATIONS:  Current Outpatient Medications  Medication Sig Dispense Refill  . ALPRAZolam (XANAX) 1 MG tablet  Take 1 tablet (1 mg total) 3 (three) times daily as needed by mouth for anxiety. 90 tablet 0  . cyclobenzaprine (FLEXERIL) 5 MG tablet Take 1 tablet (5 mg total) by mouth 3 (three) times daily as needed for muscle spasms. 30 tablet 0  . furosemide (LASIX) 20 MG tablet Take 0.5 tablets (10 mg total) daily by mouth. 30 tablet 0  . hydrALAZINE (APRESOLINE) 25 MG tablet Take 75 mg by mouth daily.     Marland Kitchen lactulose (CHRONULAC) 10 GM/15ML solution Take 30 g by mouth daily as needed for moderate constipation.     . lidocaine-prilocaine (EMLA) cream Apply 1 application topically as needed. Apply to portacath 1 1/2 hours - 2 hours prior to procedures as needed. 30 g 1  . megestrol (MEGACE) 40 MG/ML suspension Take 400 mg by mouth daily.     . Multiple Vitamins-Minerals (CENTRUM SILVER PO) Take 1 tablet by mouth daily.     Marland Kitchen oxyCODONE (OXYCONTIN) 80 mg 12 hr tablet Take 2 tablets (160 mg total) by mouth every 12 (twelve) hours. 120 tablet 0  . oxycodone (ROXICODONE) 30 MG immediate release tablet Take 1 tablet (30 mg total) by mouth every 4 (four) hours as needed for pain. 120 tablet 0  . prochlorperazine (COMPAZINE) 10 MG tablet TAKE 1 TABLET BY MOUTH EVERY 8 HOURS AS NEEDED FOR NAUSEA/VOMITING (Patient taking differently: TAKE 10 MG BY MOUTH EVERY 8 HOURS AS NEEDED FOR NAUSEA/VOMITING) 30 tablet 1  . rivaroxaban (XARELTO) 20 MG TABS tablet TAKE 1 TABLET BY MOUTH DAILY WITH SUPPER. 30 tablet 3   No current facility-administered medications for this visit.     REVIEW OF SYSTEMS:  Constitutional: Denies fevers, chills or abnormal night sweats  (+) weight gain Eyes: Denies blurriness of vision, double vision or watery eyes Ears,  nose, mouth, throat, and face: Denies mucositis or sore throat Respiratory: Denies cough, dyspnea or wheezes Cardiovascular: Denies palpitation, chest discomfort or lower extremity swelling Gastrointestinal:  Denies nausea, heartburn or change in bowel habits,  Skin: Denies abnormal skin rashes Lymphatics: Denies new lymphadenopathy or easy bruising Neurological:Denies numbness, tingling or new weaknesses Behavioral/Psych: normal Musculoskeletal: (+) back pain (+) lower extremity myalgia pain  All other systems were reviewed with the patient and are negative.  PHYSICAL EXAMINATION  ECOG PERFORMANCE STATUS: 2  Vitals:   01/01/17 1448  BP: (!) 149/68  Pulse: 83  Resp: 20  Temp: 97.7 F (36.5 C)  SpO2: 97%   Filed Weights   01/01/17 1448  Weight: 210 lb 3.2 oz (95.3 kg)     GENERAL:alert, no distress and comfortable SKIN: skin color, texture, turgor are normal, no rashes or significant lesions EYES: normal, conjunctiva are pink and non-injected, sclera clear OROPHARYNX:no exudate, no erythema and lips, buccal mucosa, and tongue normal  NECK: supple, thyroid normal size, non-tender, without nodularity LYMPH:  no palpable lymphadenopathy in the cervical, axillary or inguinal LUNGS: clear to auscultation and percussion with normal breathing effort HEART: regular rate & rhythm and no murmurs and no lower extremity edema ABDOMEN:(+) abdominal bloating, no tenderness Musculoskeletal:no cyanosis of digits and no clubbing  PSYCH: alert & oriented x 3 with fluent speech NEURO: no focal motor/sensory deficits  LABORATORY DATA:  I have reviewed the data as listed CBC Latest Ref Rng & Units 01/01/2017 12/24/2016 12/03/2016  WBC 4.0 - 10.3 10e3/uL 8.6 7.3 9.1  Hemoglobin 13.0 - 17.1 g/dL 12.3(L) 11.0(L) 12.0(L)  Hematocrit 38.4 - 49.9 % 37.2(L) 32.9(L) 36.7(L)  Platelets 140 - 400  10e3/uL 197 205 161   CMP Latest Ref Rng & Units 01/01/2017 12/24/2016 12/03/2016  Glucose 70 - 140 mg/dl  113 106 104  BUN 7.0 - 26.0 mg/dL 10.6 10.2 24.4  Creatinine 0.7 - 1.3 mg/dL 1.3 1.2 1.5(H)  Sodium 136 - 145 mEq/L 140 138 138  Potassium 3.5 - 5.1 mEq/L 3.9 3.7 3.8  Chloride 101 - 111 mmol/L - - -  CO2 22 - 29 mEq/L 22 22 23   Calcium 8.4 - 10.4 mg/dL 9.1 9.2 9.5  Total Protein 6.4 - 8.3 g/dL 7.5 7.1 7.9  Total Bilirubin 0.20 - 1.20 mg/dL 0.37 0.46 0.57  Alkaline Phos 40 - 150 U/L 87 97 94  AST 5 - 34 U/L 21 27 27   ALT 0 - 55 U/L 12 13 20     AFP:  09/14/2012: 28.9 10/11/2015: 5.3 11/01/2015: 3.7 12/02/2015: 3.7 01/22/2016: 3.4 02/18/2016: 7.1 03/17/16: 5.8 04/16/16: 4.1 06/04/2016: 3.5 07/16/2016:4.4 08/27/2016: 5.6 10/08/16: 4.3 11/05/16: 4.8 12/03/16: 5.4 01/01/17: PENDING    PATHOLOGY   Diagnosis 11/02/16  1. Stomach, biopsy - MILD CHRONIC GASTRITIS. - NEGATIVE FOR HELICOBACTER PYLORI. - NO INTESTINAL METAPLASIA, DYSPLASIA, OR MALIGNANCY. 2. Colon, polyp(s), opposite ileocecal valve - TUBULAR ADENOMA. - NO HIGH GRADE DYSPLASIA OR MALIGNANCY. 3. Rectum, polyp(s) - HYPERPLASTIC POLYP. - NO DYSPLASIA OR MALIGNANCY. Microscopic Comment 1. A Warthin-Starry stain is performed to determine the possibility of the presence of Helicobacter pylori. The Warthin-Starry stain is negative for organisms of Helicobacter pylori.   PROCEDURE  Upper Endoscopy by Dr. Gala Dillon 11/02/16  IMPRESSION:  - mildly obstructing Schatzki ring. - dilated with scope passage. erosive reflux esophagitis - Small hiatal hernia. - Portal hypertensive gastropathy. biopsied - Normal duodenal bulb and second portion of the duodenum. Biopsied.   Colonoscopy by Dr. Gala Dillon 11/02/16  IMPRESSION:  - Two 3 to 5 mm polyps in the rectum and at the ileocecal valve, removed with a cold snare. Resected and retrieved. - Diverticulosis in the sigmoid colon and in the descending colon. - The examination was otherwise normal on direct and retroflexion views.   RADIOGRAPHIC STUDIES: I have personally reviewed the  radiological images as listed and agreed with the findings in the report.  04/17/16 CT CAP W PELVIS IMPRESSION: 1. Stable mild mediastinal lymphadenopathy. 2. Interval improvement in the bibasilar peribronchial thickening, airway impaction, and peribronchovascular nodularity. Imaging features suggest marked improvement in bilateral low the atypical infection. Changes in the right middle lobe with more stable and may represent scarring from previous infectious/inflammatory etiology. 3. The small hypervascular lesions seen in the anterior left liver on the previous study are not evident today. There is some subtle, ill-defined hyperenhancement in the central left liver which is nonspecific. Attention to this area on followup imaging recommended. 4. Chronic portal vein inclusion with cavernous transformation in the porta hepatis and paraesophageal varices. 5. Stable appearance hepatoduodenal and retroperitoneal lymphadenopathy, likely chronic and related to liver disease.   MR Abdomen W WO Contrast 05/21/16 IMPRESSION: 1. Limited motion degraded scan. Cirrhosis. No evidence of a liver mass within these limitations. 2. Mild splenomegaly. No ascites. Stable mild paraumbilical and gastroesophageal varices. 3. Stable nonspecific mild retroperitoneal lymphadenopathy. 4. 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  MR Abdomen w wo contrast 08/25/2016 IMPRESSION: 1. Mild-to-moderate motion degradation, preferentially involving the pre and postcontrast dynamic images. On follow-up, given the extent of motion on this exam, multiphase CT may be preferred. 2. Given these limitations, no evidence of hepatocellular carcinoma. 3. Marked  cirrhosis and portal venous hypertension. 4. Slight increase in abdominal adenopathy, which is most likely reactive. Recommend attention on follow-up. 5. Cholelithiasis.   ASSESSMENT & PLAN: 66 y.o. Caucasian Dillon  with past medical history of successfully treated hepatitis C, liver cirrhosis, history of ascites and encephalopathy, recurrent HCC   1. Recurrent HCC, initially stage II -I previously reviewed his previous image and outside medical records extensively, confirmed key findings with patient and his family members -He initially had a multifocal disease, status post TACE 3 times in 2015 -he developed symptomatic local recurrence in 08/2015, with a 5cm new lesion in segment 8 with direct invasion into portal vein, and a portocaval lymph node. He is not a candidate for liver targeted therapy per his IR Dr. Delice Lesch -He was started on first line srafenib, tolerated poorly. His previous CT abdomen and pelvis on 01/25/2016 revealed extensive portal hypertension, and a probable cancer progression in the liver. -His treatment has changed to immunotherapy Nivolumab on 02/04/16, he tolerated very well, and clinically he is doing much better, with less pain and better energy. Continue.  -I previously discussed his restaging CT scan which was done on 02/14/2016, it actually showed decreased liver cancer size compared to the scan a month ago. No other new metastasis. -restaging CT from 04/17/2016 showed no visible liver mass, but the imaging was very limited due to his liver cirrhosis  -I previously reviewed his recent abdominal MRI from 05/21/2016, which showed no visible mass in the liver. He has had complete radiographic response. -I reviewed his restaging MRI from 08/25/2016, which showed no visible liver mass, but the images quality with significant compromised due to the motion.  -Nivo has previously been changed to 480mg  every 4 weeks in 08/2016. -His thyroid function is low at times. I previously referred him to endocrinologist, Dr. Loanne Drilling  -He has his PAC Placed  -Due to the weather he missed his MRI and CT scan. I strongly encouraged him to reschedule them soon.  -Due to poor toleration I will change his nivo back  to 240mg  every 2 weeks starting 01/02/17.  -Labs reviewed and overall stable and adequate to proceed with treatment tomorrow.  -F/u in 2 weeks    2. Abdominal pain/ back pain -Secondary to [facility] and portal vein thrombosis -overall better now with oxycodone and OxyContin, he is on very high dose, overall pain is controlled  -I previously recommended him to see pain management clinic, he declined. - I previously prescribed flexeril in attempt to help with his pain. Due to his high tolerance, he can take up to 2 a day -The patient previously stated his pain was not well controlled, I increased his OxyContin from 80 mg every 8 hours to 160 mg every 12 hours, pain is better controlled now  -He was previously taking 6 Oxycodone tablets daily. I encouraged him to cut back to 5 daily instead, he will try. -continue OxyContin 160mg  BID, oxycodone, and flexeril on board for symptom management -will continue to monitor.  3. 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 Since improving I suggest he reduce down to half tablet or once every other day.  -he will continue to follow up with Dawn   5. Hep C, successfully treated -Continue follow-up with liver clinic NP Dawn  6. Anxiety and insomnia  -He has Xanax for anxiety. I previously refilled Xanax 1 mg. -continue Ativan 1 mg at bedtime as needed for insomnia, he knows not to take with Xanax together, he knows not to take during the day. He  does not feel Ativan is working well for sleep -He is taking xanax for insomnia, refilled on 12/03/16  7. Fatigue and weight gain -He reports more fatigue and weak and lately -His TSH has been moderately elevated lately, we discussed the side effect of hypothyroidism from Nivolumab, I previously referred him to Dr. Loanne Drilling for evaluation to see if he needs low-dose Synthroid -He continues to gain weight and his wife is concerned. -He does not eat regularly and eats 2 ensure boosts daily along with Megace as needed.  -I do not suspect his weight gain is related to fluid retention. I suggest he hold Megace and reduce ensure boosts to once daily. He should use a balanced diet and exercise to help.   8. HTN -He is on Hydralazine 50 mg daily, and follow-up of his primary care physician  9. Smoking - Patient has started back smoking due to the fear of not getting better - I previously recommended a smoking cessation program. He refused.  10. Arthralgia -Patient has developed significant arthralgia, possible related to Nivolumab. It has much improved this week. Exam was unremarkable. - Patient takes OxyContin to help and oxycodone prn - I advised he can take up to ibuprofen TID - If pain worsens, I may try a short course of steroid -Refilled OxyContin on 01/01/17  11. Depression - The patient reports feelings of depression related to his diagnosis. - He reports fears he is going to die soon. - I previously encouraged the patient that his recent scan shows an excellent response to treatment.  12. Iron deficient anemia -Her hemoglobin dropped to 7.3 as of 10/16/16, from 9.4, 5 weeks prior -She is on Xarelto, no clinical overt bleeding, I'll check her iron study, haptoglobin, LDH, to ruled out hemostasis also. -Repeated CBC showed hemoglobin 6.9, I arranged 2 units of RBC transfusion that week.  -I previously contacted his GI Dr. Gala Dillon to request EGD/colonoscopy ASAP, for possible GI bleeding    -He received IV feraheme 10/16/16 and in 10/2016 -He had GI work up with upper endoscopy and colonoscopy per Dr. Gala Dillon on 11/02/16. Biopsy of the stomach showed mild chronic gastritis, negative for H. Pylori or malignancy. Colon and rectal polyps were negative for dysplasia or malignancy. He will f/u with GI again in 01/2017.  -anemia has improved, but overall stable, Hg 12.3 on 01/01/17 -if he develops anemia or GI bleeding again, will stop Xarelto    13. Goal of care discussion, DNR/DNI  -We previously discussed the incurable nature of his cancer, and the overall poor prognosis, especially if he does not have good response to chemotherapy or progress on chemo -The patient understands the goal of care is palliative. -he agrees with DNR/DNI    Plan  -I refilled oxycontin today  -Reduce Nivo to every 2 week at 240mg  starting tomorrow 01/02/17.  -Please reschedule his CT and MRI -Lab,flush, f/u and Nivolumab in 2 and 4 weeks    All  questions were answered. The patient knows to call the clinic with any problems, questions or concerns.  I spent 25 minutes counseling the patient face to face. The total time spent in the appointment was 30 minutes and more than 50% was on counseling.  This document serves as a record of services personally performed by Kevin Merle, MD. It was created on her behalf by Joslyn Devon, a trained medical scribe. The creation of this record is based on the scribe's personal observations and the provider's statements to them.    I have reviewed the above documentation for accuracy and completeness, and I agree with the above.  Kevin Dillon  01/01/2017

## 2017-01-01 ENCOUNTER — Other Ambulatory Visit (HOSPITAL_BASED_OUTPATIENT_CLINIC_OR_DEPARTMENT_OTHER): Payer: Medicare Other

## 2017-01-01 ENCOUNTER — Ambulatory Visit (HOSPITAL_BASED_OUTPATIENT_CLINIC_OR_DEPARTMENT_OTHER): Payer: Medicare Other | Admitting: Hematology

## 2017-01-01 ENCOUNTER — Encounter: Payer: Self-pay | Admitting: Hematology

## 2017-01-01 ENCOUNTER — Telehealth: Payer: Self-pay | Admitting: Hematology

## 2017-01-01 ENCOUNTER — Ambulatory Visit: Payer: Medicare Other

## 2017-01-01 VITALS — BP 149/68 | HR 83 | Temp 97.7°F | Resp 20 | Ht 73.0 in | Wt 210.2 lb

## 2017-01-01 DIAGNOSIS — R635 Abnormal weight gain: Secondary | ICD-10-CM | POA: Diagnosis not present

## 2017-01-01 DIAGNOSIS — G4700 Insomnia, unspecified: Secondary | ICD-10-CM | POA: Diagnosis not present

## 2017-01-01 DIAGNOSIS — Z720 Tobacco use: Secondary | ICD-10-CM

## 2017-01-01 DIAGNOSIS — G893 Neoplasm related pain (acute) (chronic): Secondary | ICD-10-CM | POA: Diagnosis not present

## 2017-01-01 DIAGNOSIS — C220 Liver cell carcinoma: Secondary | ICD-10-CM | POA: Diagnosis not present

## 2017-01-01 DIAGNOSIS — R109 Unspecified abdominal pain: Secondary | ICD-10-CM

## 2017-01-01 DIAGNOSIS — T451X5A Adverse effect of antineoplastic and immunosuppressive drugs, initial encounter: Secondary | ICD-10-CM

## 2017-01-01 DIAGNOSIS — F419 Anxiety disorder, unspecified: Secondary | ICD-10-CM | POA: Diagnosis not present

## 2017-01-01 DIAGNOSIS — F329 Major depressive disorder, single episode, unspecified: Secondary | ICD-10-CM

## 2017-01-01 DIAGNOSIS — Z7901 Long term (current) use of anticoagulants: Secondary | ICD-10-CM

## 2017-01-01 DIAGNOSIS — K7469 Other cirrhosis of liver: Secondary | ICD-10-CM | POA: Diagnosis not present

## 2017-01-01 DIAGNOSIS — M549 Dorsalgia, unspecified: Secondary | ICD-10-CM

## 2017-01-01 DIAGNOSIS — D6481 Anemia due to antineoplastic chemotherapy: Secondary | ICD-10-CM

## 2017-01-01 DIAGNOSIS — R530 Neoplastic (malignant) related fatigue: Secondary | ICD-10-CM

## 2017-01-01 DIAGNOSIS — Z8619 Personal history of other infectious and parasitic diseases: Secondary | ICD-10-CM | POA: Diagnosis not present

## 2017-01-01 LAB — CBC WITH DIFFERENTIAL/PLATELET
BASO%: 0.7 % (ref 0.0–2.0)
BASOS ABS: 0.1 10*3/uL (ref 0.0–0.1)
EOS ABS: 0.2 10*3/uL (ref 0.0–0.5)
EOS%: 2.1 % (ref 0.0–7.0)
HCT: 37.2 % — ABNORMAL LOW (ref 38.4–49.9)
HEMOGLOBIN: 12.3 g/dL — AB (ref 13.0–17.1)
LYMPH%: 28.5 % (ref 14.0–49.0)
MCH: 28 pg (ref 27.2–33.4)
MCHC: 33 g/dL (ref 32.0–36.0)
MCV: 84.7 fL (ref 79.3–98.0)
MONO#: 0.7 10*3/uL (ref 0.1–0.9)
MONO%: 8.2 % (ref 0.0–14.0)
NEUT%: 60.5 % (ref 39.0–75.0)
NEUTROS ABS: 5.2 10*3/uL (ref 1.5–6.5)
PLATELETS: 197 10*3/uL (ref 140–400)
RBC: 4.39 10*6/uL (ref 4.20–5.82)
RDW: 20.4 % — ABNORMAL HIGH (ref 11.0–14.6)
WBC: 8.6 10*3/uL (ref 4.0–10.3)
lymph#: 2.4 10*3/uL (ref 0.9–3.3)

## 2017-01-01 LAB — COMPREHENSIVE METABOLIC PANEL
ALBUMIN: 4 g/dL (ref 3.5–5.0)
ALK PHOS: 87 U/L (ref 40–150)
ALT: 12 U/L (ref 0–55)
ANION GAP: 11 meq/L (ref 3–11)
AST: 21 U/L (ref 5–34)
BILIRUBIN TOTAL: 0.37 mg/dL (ref 0.20–1.20)
BUN: 10.6 mg/dL (ref 7.0–26.0)
CO2: 22 mEq/L (ref 22–29)
Calcium: 9.1 mg/dL (ref 8.4–10.4)
Chloride: 107 mEq/L (ref 98–109)
Creatinine: 1.3 mg/dL (ref 0.7–1.3)
EGFR: 59 mL/min/{1.73_m2} — AB (ref >60)
GLUCOSE: 113 mg/dL (ref 70–140)
POTASSIUM: 3.9 meq/L (ref 3.5–5.1)
SODIUM: 140 meq/L (ref 136–145)
TOTAL PROTEIN: 7.5 g/dL (ref 6.4–8.3)

## 2017-01-01 LAB — TSH: TSH: 4.746 m(IU)/L — ABNORMAL HIGH (ref 0.320–4.118)

## 2017-01-01 MED ORDER — OXYCODONE HCL ER 80 MG PO T12A: 160.0000 mg | EXTENDED_RELEASE_TABLET | Freq: Two times a day (BID) | ORAL | 0 refills | Status: DC

## 2017-01-01 MED FILL — PROCHLORPERAZINE 10 MG TAB: 10 | 10 days supply | Qty: 30 | Fill #1

## 2017-01-01 MED FILL — OxyCONTIN 80 MG T12A: 80 | 30 days supply | Qty: 120 | Fill #0

## 2017-01-01 NOTE — Telephone Encounter (Signed)
Gave avs and calendar for January waiting for 12/28

## 2017-01-02 ENCOUNTER — Ambulatory Visit (HOSPITAL_BASED_OUTPATIENT_CLINIC_OR_DEPARTMENT_OTHER): Payer: Medicare Other

## 2017-01-02 VITALS — BP 188/74 | HR 97 | Temp 98.6°F | Resp 20 | Ht 73.0 in | Wt 211.8 lb

## 2017-01-02 DIAGNOSIS — Z5112 Encounter for antineoplastic immunotherapy: Secondary | ICD-10-CM | POA: Diagnosis not present

## 2017-01-02 DIAGNOSIS — C220 Liver cell carcinoma: Secondary | ICD-10-CM | POA: Diagnosis not present

## 2017-01-02 LAB — AFP TUMOR MARKER: AFP, Serum, Tumor Marker: 5.1 ng/mL (ref 0.0–8.3)

## 2017-01-02 MED ORDER — NIVOLUMAB CHEMO INJECTION 100 MG/10ML
240.0000 mg | Freq: Once | INTRAVENOUS | Status: AC
  Administered 2017-01-02: 240 mg via INTRAVENOUS
  Filled 2017-01-02: qty 24

## 2017-01-02 MED ORDER — SODIUM CHLORIDE 0.9% FLUSH
10.0000 mL | INTRAVENOUS | Status: DC | PRN
  Administered 2017-01-02: 10 mL
  Filled 2017-01-02: qty 10

## 2017-01-02 MED ORDER — HEPARIN SOD (PORK) LOCK FLUSH 100 UNIT/ML IV SOLN
500.0000 [IU] | Freq: Once | INTRAVENOUS | Status: AC | PRN
  Administered 2017-01-02: 500 [IU]
  Filled 2017-01-02: qty 5

## 2017-01-02 MED ORDER — SODIUM CHLORIDE 0.9 % IV SOLN
Freq: Once | INTRAVENOUS | Status: AC
Start: 2017-01-02 — End: 2017-01-02
  Administered 2017-01-02: 10:00:00 via INTRAVENOUS

## 2017-01-02 NOTE — Patient Instructions (Signed)
[facility] Discharge Instructions for Patients Receiving Chemotherapy  Today you received the following chemotherapy agents:  Nivolumab  To help prevent nausea and vomiting after your treatment, we encourage you to take your nausea medication as prescribed.   If you develop nausea and vomiting that is not controlled by your nausea medication, call the clinic.   BELOW ARE SYMPTOMS THAT SHOULD BE REPORTED IMMEDIATELY:  *FEVER GREATER THAN 100.5 F  *CHILLS WITH OR WITHOUT FEVER  NAUSEA AND VOMITING THAT IS NOT CONTROLLED WITH YOUR NAUSEA MEDICATION  *UNUSUAL SHORTNESS OF BREATH  *UNUSUAL BRUISING OR BLEEDING  TENDERNESS IN MOUTH AND THROAT WITH OR WITHOUT PRESENCE OF ULCERS  *URINARY PROBLEMS  *BOWEL PROBLEMS  UNUSUAL RASH Items with * indicate a potential emergency and should be followed up as soon as possible.  Feel free to call the clinic should you have any questions or concerns. The clinic phone number is [phone].  Please show the CHEMO ALERT CARD at check-in to the Emergency Department and triage nurse.   

## 2017-01-03 ENCOUNTER — Other Ambulatory Visit: Payer: Self-pay | Admitting: Hematology

## 2017-01-03 DIAGNOSIS — E039 Hypothyroidism, unspecified: Secondary | ICD-10-CM

## 2017-01-04 ENCOUNTER — Other Ambulatory Visit: Payer: Self-pay | Admitting: *Deleted

## 2017-01-04 ENCOUNTER — Encounter: Payer: Self-pay | Admitting: Hematology

## 2017-01-04 ENCOUNTER — Telehealth: Payer: Self-pay | Admitting: *Deleted

## 2017-01-04 DIAGNOSIS — D6481 Anemia due to antineoplastic chemotherapy: Secondary | ICD-10-CM

## 2017-01-04 DIAGNOSIS — T451X5A Adverse effect of antineoplastic and immunosuppressive drugs, initial encounter: Secondary | ICD-10-CM

## 2017-01-04 DIAGNOSIS — C220 Liver cell carcinoma: Secondary | ICD-10-CM

## 2017-01-04 MED ORDER — ALPRAZOLAM 1 MG PO TABS: 1.0000 mg | ORAL_TABLET | Freq: Four times a day (QID) | ORAL | 0 refills | Status: DC | PRN

## 2017-01-04 MED FILL — ALPRAZolam 1 MG TABS: 1 | 30 days supply | Qty: 90 | Fill #0

## 2017-01-04 NOTE — Telephone Encounter (Signed)
Received call from pt this am stating that he is out of his xanax & would like a refill.  He reports taking sometimes 4/day & would like Dr Burr Medico to increase to 4x/d due to more anxiety.  Will discuss with Dr Burr Medico.

## 2017-01-04 NOTE — Progress Notes (Signed)
Submitted auth request for Alprazolam today.  Status is pending.

## 2017-01-06 ENCOUNTER — Encounter: Payer: Self-pay | Admitting: Hematology

## 2017-01-06 ENCOUNTER — Telehealth: Payer: Self-pay | Admitting: Hematology

## 2017-01-06 NOTE — Progress Notes (Signed)
Pt's Alprazolam was approved w/ the quantity of 120 per 30 days from 01/04/17 - 01/04/18.

## 2017-01-06 NOTE — Telephone Encounter (Signed)
Called regarding 12/28

## 2017-01-07 DIAGNOSIS — K7469 Other cirrhosis of liver: Secondary | ICD-10-CM | POA: Diagnosis not present

## 2017-01-07 DIAGNOSIS — C220 Liver cell carcinoma: Secondary | ICD-10-CM | POA: Diagnosis not present

## 2017-01-07 DIAGNOSIS — R188 Other ascites: Secondary | ICD-10-CM | POA: Diagnosis not present

## 2017-01-08 ENCOUNTER — Ambulatory Visit (HOSPITAL_COMMUNITY)
Admission: RE | Admit: 2017-01-08 | Discharge: 2017-01-08 | Disposition: A | Discharge status: Home / Self Care | Payer: Medicare Other | Source: Ambulatory Visit | Attending: Hematology | Admitting: Hematology

## 2017-01-08 DIAGNOSIS — R599 Enlarged lymph nodes, unspecified: Secondary | ICD-10-CM | POA: Insufficient documentation

## 2017-01-08 DIAGNOSIS — C220 Liver cell carcinoma: Secondary | ICD-10-CM | POA: Insufficient documentation

## 2017-01-08 DIAGNOSIS — R918 Other nonspecific abnormal finding of lung field: Secondary | ICD-10-CM | POA: Insufficient documentation

## 2017-01-08 DIAGNOSIS — I700 Atherosclerosis of aorta: Secondary | ICD-10-CM | POA: Diagnosis not present

## 2017-01-08 DIAGNOSIS — J439 Emphysema, unspecified: Secondary | ICD-10-CM | POA: Diagnosis not present

## 2017-01-08 DIAGNOSIS — K8020 Calculus of gallbladder without cholecystitis without obstruction: Secondary | ICD-10-CM | POA: Diagnosis not present

## 2017-01-08 DIAGNOSIS — K740 Hepatic fibrosis: Secondary | ICD-10-CM | POA: Diagnosis not present

## 2017-01-08 DIAGNOSIS — K7460 Unspecified cirrhosis of liver: Secondary | ICD-10-CM | POA: Diagnosis not present

## 2017-01-08 DIAGNOSIS — R161 Splenomegaly, not elsewhere classified: Secondary | ICD-10-CM | POA: Insufficient documentation

## 2017-01-08 DIAGNOSIS — I81 Portal vein thrombosis: Secondary | ICD-10-CM | POA: Insufficient documentation

## 2017-01-08 MED ORDER — GADOBENATE DIMEGLUMINE 529 MG/ML IV SOLN
20.0000 mL | Freq: Once | INTRAVENOUS | Status: AC | PRN
  Administered 2017-01-08: 20 mL via INTRAVENOUS

## 2017-01-15 ENCOUNTER — Ambulatory Visit (HOSPITAL_BASED_OUTPATIENT_CLINIC_OR_DEPARTMENT_OTHER): Payer: Medicare Other

## 2017-01-15 ENCOUNTER — Ambulatory Visit (HOSPITAL_BASED_OUTPATIENT_CLINIC_OR_DEPARTMENT_OTHER): Payer: Medicare Other | Admitting: Hematology

## 2017-01-15 ENCOUNTER — Other Ambulatory Visit (HOSPITAL_BASED_OUTPATIENT_CLINIC_OR_DEPARTMENT_OTHER): Payer: Medicare Other

## 2017-01-15 ENCOUNTER — Ambulatory Visit: Payer: Medicare Other

## 2017-01-15 ENCOUNTER — Encounter: Payer: Self-pay | Admitting: Hematology

## 2017-01-15 ENCOUNTER — Telehealth: Payer: Self-pay | Admitting: Hematology

## 2017-01-15 VITALS — BP 135/89 | HR 89 | Temp 97.9°F | Resp 18 | Ht 73.0 in | Wt 212.4 lb

## 2017-01-15 DIAGNOSIS — G893 Neoplasm related pain (acute) (chronic): Secondary | ICD-10-CM

## 2017-01-15 DIAGNOSIS — Z86718 Personal history of other venous thrombosis and embolism: Secondary | ICD-10-CM

## 2017-01-15 DIAGNOSIS — C220 Liver cell carcinoma: Secondary | ICD-10-CM

## 2017-01-15 DIAGNOSIS — R188 Other ascites: Secondary | ICD-10-CM

## 2017-01-15 DIAGNOSIS — G4700 Insomnia, unspecified: Secondary | ICD-10-CM

## 2017-01-15 DIAGNOSIS — D500 Iron deficiency anemia secondary to blood loss (chronic): Secondary | ICD-10-CM

## 2017-01-15 DIAGNOSIS — E039 Hypothyroidism, unspecified: Secondary | ICD-10-CM | POA: Diagnosis not present

## 2017-01-15 DIAGNOSIS — Z5112 Encounter for antineoplastic immunotherapy: Secondary | ICD-10-CM

## 2017-01-15 DIAGNOSIS — D6481 Anemia due to antineoplastic chemotherapy: Secondary | ICD-10-CM | POA: Diagnosis not present

## 2017-01-15 DIAGNOSIS — T451X5A Adverse effect of antineoplastic and immunosuppressive drugs, initial encounter: Secondary | ICD-10-CM

## 2017-01-15 DIAGNOSIS — Z7901 Long term (current) use of anticoagulants: Secondary | ICD-10-CM

## 2017-01-15 LAB — CBC WITH DIFFERENTIAL/PLATELET
BASO%: 0.3 % (ref 0.0–2.0)
Basophils Absolute: 0 10*3/uL (ref 0.0–0.1)
EOS%: 4.4 % (ref 0.0–7.0)
Eosinophils Absolute: 0.3 10*3/uL (ref 0.0–0.5)
HEMATOCRIT: 34.9 % — AB (ref 38.4–49.9)
HGB: 11.9 g/dL — ABNORMAL LOW (ref 13.0–17.1)
LYMPH%: 31.6 % (ref 14.0–49.0)
MCH: 29 pg (ref 27.2–33.4)
MCHC: 34.1 g/dL (ref 32.0–36.0)
MCV: 84.9 fL (ref 79.3–98.0)
MONO#: 0.5 10*3/uL (ref 0.1–0.9)
MONO%: 7.9 % (ref 0.0–14.0)
NEUT%: 55.8 % (ref 39.0–75.0)
NEUTROS ABS: 3.7 10*3/uL (ref 1.5–6.5)
Platelets: 122 10*3/uL — ABNORMAL LOW (ref 140–400)
RBC: 4.11 10*6/uL — AB (ref 4.20–5.82)
RDW: 16.7 % — ABNORMAL HIGH (ref 11.0–14.6)
WBC: 6.6 10*3/uL (ref 4.0–10.3)
lymph#: 2.1 10*3/uL (ref 0.9–3.3)
nRBC: 0 % (ref 0–0)

## 2017-01-15 LAB — COMPREHENSIVE METABOLIC PANEL
ALBUMIN: 3.6 g/dL (ref 3.5–5.0)
ALK PHOS: 98 U/L (ref 40–150)
ALT: 18 U/L (ref 0–55)
AST: 26 U/L (ref 5–34)
Anion Gap: 9 mEq/L (ref 3–11)
BILIRUBIN TOTAL: 0.49 mg/dL (ref 0.20–1.20)
BUN: 12.3 mg/dL (ref 7.0–26.0)
CALCIUM: 9.2 mg/dL (ref 8.4–10.4)
CO2: 24 mEq/L (ref 22–29)
CREATININE: 1.3 mg/dL (ref 0.7–1.3)
Chloride: 107 mEq/L (ref 98–109)
EGFR: 55 mL/min/{1.73_m2} — ABNORMAL LOW (ref >60)
GLUCOSE: 123 mg/dL (ref 70–140)
POTASSIUM: 3.6 meq/L (ref 3.5–5.1)
SODIUM: 140 meq/L (ref 136–145)
TOTAL PROTEIN: 7.1 g/dL (ref 6.4–8.3)

## 2017-01-15 MED ORDER — SODIUM CHLORIDE 0.9 % IV SOLN
510.0000 mg | Freq: Once | INTRAVENOUS | Status: DC
  Filled 2017-01-15: qty 17

## 2017-01-15 MED ORDER — HEPARIN SOD (PORK) LOCK FLUSH 100 UNIT/ML IV SOLN
250.0000 [IU] | Freq: Once | INTRAVENOUS | Status: DC | PRN
  Filled 2017-01-15: qty 5

## 2017-01-15 MED ORDER — HEPARIN SOD (PORK) LOCK FLUSH 100 UNIT/ML IV SOLN
500.0000 [IU] | Freq: Once | INTRAVENOUS | Status: DC | PRN
  Filled 2017-01-15: qty 5

## 2017-01-15 MED ORDER — SODIUM CHLORIDE 0.9% FLUSH
10.0000 mL | Freq: Once | INTRAVENOUS | Status: AC
  Administered 2017-01-15: 10 mL
  Filled 2017-01-15: qty 10

## 2017-01-15 MED ORDER — SODIUM CHLORIDE 0.9% FLUSH
3.0000 mL | Freq: Once | INTRAVENOUS | Status: DC | PRN
  Filled 2017-01-15: qty 10

## 2017-01-15 MED ORDER — HEPARIN SOD (PORK) LOCK FLUSH 100 UNIT/ML IV SOLN
500.0000 [IU] | Freq: Once | INTRAVENOUS | Status: AC | PRN
  Administered 2017-01-15: 500 [IU]
  Filled 2017-01-15: qty 5

## 2017-01-15 MED ORDER — OXYCODONE HCL 30 MG PO TABS: 30.0000 mg | ORAL_TABLET | ORAL | 0 refills | Status: DC | PRN

## 2017-01-15 MED ORDER — SODIUM CHLORIDE 0.9 % IV SOLN
240.0000 mg | Freq: Once | INTRAVENOUS | Status: AC
  Administered 2017-01-15: 240 mg via INTRAVENOUS
  Filled 2017-01-15: qty 24

## 2017-01-15 MED ORDER — ALTEPLASE 2 MG IJ SOLR
2.0000 mg | Freq: Once | INTRAMUSCULAR | Status: DC | PRN
  Filled 2017-01-15: qty 2

## 2017-01-15 MED ORDER — SODIUM CHLORIDE 0.9% FLUSH
10.0000 mL | INTRAVENOUS | Status: DC | PRN
  Administered 2017-01-15: 10 mL
  Filled 2017-01-15: qty 10

## 2017-01-15 MED ORDER — SODIUM CHLORIDE 0.9 % IV SOLN
Freq: Once | INTRAVENOUS | Status: AC
  Administered 2017-01-15: 15:00:00 via INTRAVENOUS

## 2017-01-15 MED FILL — oxyCODONE HCL 30 MG TABS: 30 | 20 days supply | Qty: 120 | Fill #0

## 2017-01-15 NOTE — Progress Notes (Signed)
[place]  [phone] [phone]  Clinic Follow up Note   Patient Care Team: Antonietta Jewel, MD as PCP - General (Internal Medicine) [name], Cristopher Estimable, MD as Attending Physician (Gastroenterology) Antonietta Jewel, MD as Referring Physician (Internal Medicine) Truitt Merle, MD as Consulting Physician (Hematology) [name], CRNP as Nurse Practitioner (Nurse Practitioner)   Date of Service:  01/15/2017   CHIEF COMPLAINTS:  Follow up recurrent [place]   Oncology History   Hepatocellular carcinoma ([place])   Staging form: Liver (Excluding Intrahepatic Bile Ducts), AJCC 7th Edition   - Clinical stage from 04/16/2012: Stage II (T2(m), N0, M0) - Signed by Truitt Merle, MD on 10/12/2015   - Pathologic stage from 04/16/2013: Stage II (T2, N0, cM0) - Signed by Truitt Merle, MD on 10/12/2015      Hepatocellular carcinoma ([place])   05/1927 Imaging         09/14/2012 Tumor Marker    AFP 28.9      04/16/2013 Imaging    Abdominal MRI with and without contrast reviewed multifocal (4) liver lesions in both left and right lobes, most consistent with HCC, largest measuring 2.7 cm      04/16/2013 Initial Diagnosis    Hepatocellular carcinoma ([place])      04/28/2013 Procedure    Right TACE with lipiodol       06/15/2013 Procedure    Left TACE with [facility]       07/14/2013 Procedure    Right TACE with [facility]      09/05/2015 Imaging    CT abdomen with and without contrast showed a new 5.0 x 2.3 cm mass in the hepatic segment 8, invading and occluding the right anterior portal vein, most compatible with [place]. A portacaval node measuring 3.0 x 1.4 cm, previously 2.9 x 1.1 cm.      10/11/2015 Tumor Marker    AFP 5.3      11/11/2015 - 02/03/2016 Chemotherapy    sorafenib 200mg  bid started on 11/11/2015, increased to 400mg  bid in 2 weeks, stopped due to poor tolerance and probable disease progression       11/14/2015 Imaging    CT CAP 11/14/2015 IMPRESSION: Chest Impression: 1.  RIGHT upper lobe pulmonary nodule is indeterminate. No comparison available. 2. Ground-glass opacity and peripheral nodules in the RIGHT middle lobe appear post infectious or inflammatory.  Abdomen / Pelvis Impression: 1. 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 Thrombus also likely within the LEFT and RIGHT portal vein. Difficult to distinguish tumor thrombus versus bland thrombus. 2. Individual lesions are difficult to define in the RIGHT hepatic lobe. There is overall impression of progression of disease in the RIGHT hepatic lobe with multiple ill-defined enhancing lesions. 3. Periportal and shotty retroperitoneal adenopathy similar to prior.      01/25/2016 Imaging    CT Abdomen Pelvis w/ Contrast IMPRESSION: 1. Findings consistent with multifocal liver carcinoma with extensive portal vein thrombosis, and subsequent development of porta hepatis venous collaterals. Thrombus in the central portal vein is no longer expansile, but still expands the more peripheral portal vein and the right and left portal veins. There are few right liver bile ducts there are now dilated, which suggests mild progression of liver disease. 2. There is mild splenomegaly consistent or venous hypertension, which has mildly increased from the prior CT. 3. There are new lung abnormalities with interstitial thickening and ill-defined peribronchovascular nodular opacities mostly at  the right lung base with a smaller area noted along the medial left lower lobe. The etiology may be infectious. It may be inflammatory, possibly related to chemotherapy. Neoplastic disease is also possible. 4. There is no other evidence of metastatic disease within the abdomen or pelvis. No acute findings within the abdomen or pelvis      01/25/2016 - 01/25/2016 Hospital Admission    Pt was seen in ED for worsening abdominal pain, treated  with pain meds       02/04/2016 -  Chemotherapy    The patient started on immunotherapy treatment nivolumab every 2 weeks; on 04/30/16 dose was reduced to 420mg  every 3 weeks       02/14/2016 Imaging    CT CAP w/ contrast 1. Slight interval increase in size of the mediastinal lymph nodes. 2. Persistent extensive lower lobe peribronchial thickening and patchy infiltrates. Possible aspiration pneumonia. 3. Persistent tree-in-bud appearance in the right middle lobe, likely chronic inflammation or atypical infection. 4. Improved CT appearance of the liver. Two small residual arterial phase enhancing lesions are identified. 5. Chronic portal vein thrombosis with cavernous transformation of portal venous collaterals. Esophageal varices are again noted. 6. Stable upper abdominal lymph nodes likely related to cirrhosis.       04/16/2016 Tumor Marker    AFP 4.1       04/17/2016 Imaging     CT CAP W PELVIS IMPRESSION: 1. Stable mild mediastinal lymphadenopathy. 2. Interval improvement in the bibasilar peribronchial thickening, airway impaction, and peribronchovascular nodularity. Imaging features suggest marked improvement in bilateral low the atypical infection. Changes in the right middle lobe with more stable and may represent scarring from previous infectious/inflammatory etiology. 3. The small hypervascular lesions seen in the anterior left liver on the previous study are not evident today. There is some subtle, ill-defined hyperenhancement in the central left liver which is nonspecific. Attention to this area on followup imaging recommended. 4. Chronic portal vein inclusion with cavernous transformation in the porta hepatis and paraesophageal varices. 5. Stable appearance hepatoduodenal and retroperitoneal lymphadenopathy, likely chronic and related to liver disease.      04/17/2016 Imaging    CT CAP CONTRAST IMPRESSION: 1. Stable mild mediastinal lymphadenopathy. 2. Interval  improvement in the bibasilar peribronchial thickening, airway impaction, and peribronchovascular nodularity. Imaging features suggest marked improvement in bilateral low the atypical infection. Changes in the right middle lobe with more stable and may represent scarring from previous infectious/inflammatory etiology. 3. The small hypervascular lesions seen in the anterior left liver on the previous study are not evident today. There is some subtle, ill-defined hyperenhancement in the central left liver which is nonspecific. Attention to this area on followup imaging recommended. 4. Chronic portal vein inclusion with cavernous transformation in the porta hepatis and paraesophageal varices. 5. Stable appearance hepatoduodenal and retroperitoneal lymphadenopathy, likely chronic and related to liver disease.      05/21/2016 Imaging    MR Abdomen W WO Contrast IMPRESSION: 1. Limited motion degraded scan. Cirrhosis. No evidence of a liver mass within these limitations. 2. Mild splenomegaly. No ascites. Stable mild paraumbilical and gastroesophageal varices. 3. Stable nonspecific mild retroperitoneal lymphadenopathy. 4. 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      06/04/2016 Tumor Marker    AFP 3.5       08/25/2016 Imaging    MR Abdomen w wo contrast  IMPRESSION: 1. Mild-to-moderate motion degradation, preferentially involving the pre and postcontrast dynamic images. On follow-up, given the extent  of motion on this exam, multiphase CT may be preferred. 2. Given these limitations, no evidence of hepatocellular carcinoma. 3. Marked cirrhosis and portal venous hypertension. 4. Slight increase in abdominal adenopathy, which is most likely reactive. Recommend attention on follow-up. 5. Cholelithiasis.       11/02/2016 Procedure    Upper endoscopy and colonoscopy  Diagnosis 1. Stomach, biopsy - MILD CHRONIC GASTRITIS. - NEGATIVE FOR  HELICOBACTER PYLORI. - NO INTESTINAL METAPLASIA, DYSPLASIA, OR MALIGNANCY. 2. Colon, polyp(s), opposite ileocecal valve - TUBULAR ADENOMA. - NO HIGH GRADE DYSPLASIA OR MALIGNANCY. 3. Rectum, polyp(s) - HYPERPLASTIC POLYP. - NO DYSPLASIA OR MALIGNANCY.      01/08/2017 Imaging    CT Chest WO Contrast 01/08/17  IMPRESSION: 1. Stable chest CT. 2. Prominent to mildly enlarged mediastinal lymph nodes are stable. Based on stability, these are favored to be reactive. 3. Stable emphysema and right upper lobe nodularity. 4. Mild atherosclerosis.      01/08/2017 Imaging    MRI Abd W WO Contrast 01/08/17  IMPRESSION: 1. Cirrhotic liver now with severe iron deposition throughout the hepatic parenchyma, as well as areas of developing confluent hepatic fibrosis. Today's study clearly demonstrates 2 new hypervascular nodules in segment 4A of the liver, which demonstrate washout but no definite pseudocapsule, the largest which measures 1.5 cm. At this time, these are considered suspicious for hepatocellular carcinoma, bladder classified as Li-RADS 3 lesions. 2. 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 3. Cholelithiasis without evidence of acute cholecystitis at this time. 4. Mild splenomegaly.       HISTORY OF PRESENTING ILLNESS (10/11/2015):  [name] 66 y.o. male with past medical history of treated hepatitis C, liver cirrhosis, and hepatocellular carcinoma is here because of recurrent hepatocellular carcinoma. He is accompanied by his significant other [name] and her sister.  He was diagnosed with hepatocellular carcinoma in 2015, his initial image result is not available and staging is unknown. He was seen by interventional radiologist Dr. Delice Lesch at [facility] in [place] and underwent TACE procedure 3 times in 2015. He was subsequently followed, and recently repeated CT scan showed a new 5.0 cm mass in segment 8, with portal vein  invasion. He was felt not to be a candidate for further liver targeted therapy, and was referred to [place] for further systemic therapy.  He complains about mid epigastric pain for the past 2 years, which significantly improved after his TACE procedure in 2015. It has been getting worse lately in the past 6 months. He states he gets about 7-8 out of 10, persistent, he has been taking oxycodone 20 mg every 6 hours, but his pain is not well controlled. He is quite fatigued, is low appetite. He last 40 lbs in the pat 4 months. He is able to function at home, in trying to remain to be physically active.  His hepatitis C was successfully treated. He has history of hepatitis C, complicated with ascites and encephalopathy, but it has been well controlled with medical management.   CURRENT THERAPY:  Nivolumab 240mg  every 2 weeks, started on 02/04/2016; on 04/30/16 switched to every 3 weeks due to fatigue; change from 240mg  to 480mg  every 4 weeks starting 09/03/2016. Given his poor toleration, will reduce him to every 2 weeks at 240mg  starting 01/02/17   INTERIM HISTORY:   Kevin Dillon returns for follow up. He presents to the clinic today noting he needs a refill for OxyContin BID but will wait for its refill in 01/2017.  In the meantime he requests a refill for oxycodone, He does not have enough for his refill in a week. He takes Megace as needed but not often. He notes his BM has been doing well. Overall he has tolerated treatment well at current dose and prepared to proceed with next treatment.     MEDICAL HISTORY:  Past Medical History:  Diagnosis Date  . Cancer ([place])    liver  . Cholelithiasis   . Chronic hepatitis C ([place])   . Chronic knee pain   . Chronic left shoulder pain   . CVA (cerebral infarction)   . Gout   . Hepatitis C    genotype 1b.  pt has been vaccinated against hep A and B.  . Hypertension   . Osteoarthritis   . Schatzki's ring   . Tubular adenoma of colon 12/2011    SURGICAL  HISTORY: Past Surgical History:  Procedure Laterality Date  . AMPUTATION Left 09/17/2012   Procedure: SMALL FINGER EXTENSOR TENDON REPAIR; METACARPAL LEVEL AMPUTATION RING FINGER; PROXIMAL PHALANX LEVEL AMPUTATION LONG FINGER;  Surgeon: Schuyler Amor, MD;  Location: [place];  Service: Orthopedics;  Laterality: Left;  . Arm surgery     right/plate in arm  . BIOPSY  11/02/2016   Procedure: BIOPSY;  Surgeon: Daneil Dolin, MD;  [facility];  Service: Endoscopy;;  gastric   . COLONOSCOPY WITH ESOPHAGOGASTRODUODENOSCOPY (EGD)  12/30/2011   RMR: Noncritical Schatzki's ring;  Hiatal hernia, Tubular ADENOMA removed from splenic flexure, otherwise normal colonoscopy  . COLONOSCOPY WITH PROPOFOL N/A 11/02/2016   Procedure: COLONOSCOPY WITH PROPOFOL;  Surgeon: Daneil Dolin, MD;  [facility];  Service: Endoscopy;  Laterality: N/A;  1045  . ESOPHAGOGASTRODUODENOSCOPY (EGD) WITH PROPOFOL N/A 11/02/2016   Procedure: ESOPHAGOGASTRODUODENOSCOPY (EGD) WITH PROPOFOL;  Surgeon: Daneil Dolin, MD;  [facility];  Service: Endoscopy;  Laterality: N/A;  . IR FLUORO GUIDE PORT INSERTION RIGHT  10/21/2016  . IR US GUIDE VASC ACCESS RIGHT  10/21/2016  . LEG SURGERY     left  . POLYPECTOMY  11/02/2016   Procedure: POLYPECTOMY;  Surgeon: Daneil Dolin, MD;  [facility];  Service: Endoscopy;;  colon  . WOUND EXPLORATION Left 09/17/2012   Procedure: WOUND EXPLORATION;  Surgeon: Schuyler Amor, MD;  Location: [facility];  Service: Orthopedics;  Laterality: Left;    SOCIAL HISTORY: Social History   Socioeconomic History  . Marital status: Single    Spouse name: Not on file  . Number of children: 0  . Years of education: Not on file  . Highest education level: Not on file  Social Needs  . Financial resource strain: Not on file  . Food insecurity - worry: Not on file  . Food insecurity - inability: Not on file  . Transportation needs - medical: Not on file   . Transportation needs - non-medical: Not on file  Occupational History  . Occupation: disabled  Tobacco Use  . Smoking status: Former Smoker    Packs/day: 0.50    Years: 40.00    Pack years: 20.00    Types: Cigarettes    Last attempt to quit: 01/19/2012    Years since quitting: 4.9  . Smokeless tobacco: Never Used  . Tobacco comment: Smokes 1/2 pack of cigarettes daily  Substance and Sexual Activity  . Alcohol use: No    Comment: HX 2 beers 3-4 days per week; QUIT DEC 2013  . Drug use: No  Comment: Hx cocaine yrs ago, Last marijuana OCT 2013  . Sexual activity: Not Currently    Partners: Female  Other Topics Concern  . Not on file  Social History Narrative   Lives w/ significant other, [name]    FAMILY HISTORY: Family History  Problem Relation Age of Onset  . Cirrhosis Father 10  . Cancer Father        liver cancer   . Lung cancer Mother 77  . Colon cancer Neg Hx     ALLERGIES:  has No Known Allergies.  MEDICATIONS:  Current Outpatient Medications  Medication Sig Dispense Refill  . ALPRAZolam (XANAX) 1 MG tablet Take 1 tablet (1 mg total) by mouth every 6 (six) hours as needed for anxiety. 120 tablet 0  . cyclobenzaprine (FLEXERIL) 5 MG tablet Take 1 tablet (5 mg total) by mouth 3 (three) times daily as needed for muscle spasms. 30 tablet 0  . furosemide (LASIX) 20 MG tablet Take 0.5 tablets (10 mg total) daily by mouth. 30 tablet 0  . hydrALAZINE (APRESOLINE) 25 MG tablet Take 75 mg by mouth daily.     Marland Kitchen lactulose (CHRONULAC) 10 GM/15ML solution Take 30 g by mouth daily as needed for moderate constipation.     . lidocaine-prilocaine (EMLA) cream Apply 1 application topically as needed. Apply to portacath 1 1/2 hours - 2 hours prior to procedures as needed. 30 g 1  . megestrol (MEGACE) 40 MG/ML suspension Take 400 mg by mouth daily.     . Multiple Vitamins-Minerals (CENTRUM SILVER PO) Take 1 tablet by mouth daily.     Marland Kitchen oxyCODONE (OXYCONTIN) 80 mg 12 hr  tablet Take 2 tablets (160 mg total) by mouth every 12 (twelve) hours. 120 tablet 0  . oxycodone (ROXICODONE) 30 MG immediate release tablet Take 1 tablet (30 mg total) by mouth every 4 (four) hours as needed for pain. 120 tablet 0  . prochlorperazine (COMPAZINE) 10 MG tablet TAKE 1 TABLET BY MOUTH EVERY 8 HOURS AS NEEDED FOR NAUSEA/VOMITING (Patient taking differently: TAKE 10 MG BY MOUTH EVERY 8 HOURS AS NEEDED FOR NAUSEA/VOMITING) 30 tablet 1  . rivaroxaban (XARELTO) 20 MG TABS tablet TAKE 1 TABLET BY MOUTH DAILY WITH SUPPER. 30 tablet 3   No current facility-administered medications for this visit.     REVIEW OF SYSTEMS:  Constitutional: Denies fevers, chills or abnormal night sweats  (+) weight gain Eyes: Denies blurriness of vision, double vision or watery eyes Ears, nose, mouth, throat, and face: Denies mucositis or sore throat Respiratory: Denies cough, dyspnea or wheezes Cardiovascular: Denies palpitation, chest discomfort or lower extremity swelling Gastrointestinal:  Denies nausea, heartburn or change in bowel habits,  Skin: Denies abnormal skin rashes Lymphatics: Denies new lymphadenopathy or easy bruising Neurological:Denies numbness, tingling or new weaknesses Behavioral/Psych: normal Musculoskeletal: (+) back pain (+) lower extremity myalgia pain (+) pain controlled  All other systems were reviewed with the patient and are negative.  PHYSICAL EXAMINATION  ECOG PERFORMANCE STATUS: 2  Vitals:   01/15/17 1336  BP: 135/89  Pulse: 89  Resp: 18  Temp: 97.9 F (36.6 C)  SpO2: 100%   Filed Weights   01/15/17 1336  Weight: 212 lb 6.4 oz (96.3 kg)     GENERAL:alert, no distress and comfortable SKIN: skin color, texture, turgor are normal, no rashes or significant lesions EYES: normal, conjunctiva are pink and non-injected, sclera clear OROPHARYNX:no exudate, no erythema and lips, buccal mucosa, and tongue normal  NECK: supple, thyroid normal size, non-tender,  without  nodularity LYMPH:  no palpable lymphadenopathy in the cervical, axillary or inguinal LUNGS: clear to auscultation and percussion with normal breathing effort HEART: regular rate & rhythm and no murmurs and no lower extremity edema ABDOMEN:(+) abdominal bloating, no tenderness Musculoskeletal:no cyanosis of digits and no clubbing  PSYCH: alert & oriented x 3 with fluent speech NEURO: no focal motor/sensory deficits  LABORATORY DATA:  I have reviewed the data as listed CBC Latest Ref Rng & Units 01/15/2017 01/01/2017 12/24/2016  WBC 4.0 - 10.3 10e3/uL 6.6 8.6 7.3  Hemoglobin 13.0 - 17.1 g/dL 11.9(L) 12.3(L) 11.0(L)  Hematocrit 38.4 - 49.9 % 34.9(L) 37.2(L) 32.9(L)  Platelets 140 - 400 10e3/uL 122(L) 197 205   CMP Latest Ref Rng & Units 01/15/2017 01/01/2017 12/24/2016  Glucose 70 - 140 mg/dl 123 113 106  BUN 7.0 - 26.0 mg/dL 12.3 10.6 10.2  Creatinine 0.7 - 1.3 mg/dL 1.3 1.3 1.2  Sodium 136 - 145 mEq/L 140 140 138  Potassium 3.5 - 5.1 mEq/L 3.6 3.9 3.7  Chloride 101 - 111 mmol/L - - -  CO2 22 - 29 mEq/L 24 22 22   Calcium 8.4 - 10.4 mg/dL 9.2 9.1 9.2  Total Protein 6.4 - 8.3 g/dL 7.1 7.5 7.1  Total Bilirubin 0.20 - 1.20 mg/dL 0.49 0.37 0.46  Alkaline Phos 40 - 150 U/L 98 87 97  AST 5 - 34 U/L 26 21 27   ALT 0 - 55 U/L 18 12 13     AFP:  09/14/2012: 28.9 10/11/2015: 5.3 11/01/2015: 3.7 12/02/2015: 3.7 01/22/2016: 3.4 02/18/2016: 7.1 03/17/16: 5.8 04/16/16: 4.1 06/04/2016: 3.5 07/16/2016:4.4 08/27/2016: 5.6 10/08/16: 4.3 11/05/16: 4.8 12/03/16: 5.4 01/01/17: 5.1   PATHOLOGY   Diagnosis 11/02/16  1. Stomach, biopsy - MILD CHRONIC GASTRITIS. - NEGATIVE FOR HELICOBACTER PYLORI. - NO INTESTINAL METAPLASIA, DYSPLASIA, OR MALIGNANCY. 2. Colon, polyp(s), opposite ileocecal valve - TUBULAR ADENOMA. - NO HIGH GRADE DYSPLASIA OR MALIGNANCY. 3. Rectum, polyp(s) - HYPERPLASTIC POLYP. - NO DYSPLASIA OR MALIGNANCY. Microscopic Comment 1. A Warthin-Starry stain is performed to determine  the possibility of the presence of Helicobacter pylori. The Warthin-Starry stain is negative for organisms of Helicobacter pylori.   PROCEDURE  Upper Endoscopy by Dr. [name] 11/02/16  IMPRESSION:  - mildly obstructing Schatzki ring. - dilated with scope passage. erosive reflux esophagitis - Small hiatal hernia. - Portal hypertensive gastropathy. biopsied - Normal duodenal bulb and second portion of the duodenum. Biopsied.   Colonoscopy by Dr. [name] 11/02/16  IMPRESSION:  - Two 3 to 5 mm polyps in the rectum and at the ileocecal valve, removed with a cold snare. Resected and retrieved. - Diverticulosis in the sigmoid colon and in the descending colon. - The examination was otherwise normal on direct and retroflexion views.   RADIOGRAPHIC STUDIES: I have personally reviewed the radiological images as listed and agreed with the findings in the report.  04/17/16 CT CAP W PELVIS IMPRESSION: 1. Stable mild mediastinal lymphadenopathy. 2. Interval improvement in the bibasilar peribronchial thickening, airway impaction, and peribronchovascular nodularity. Imaging features suggest marked improvement in bilateral low the atypical infection. Changes in the right middle lobe with more stable and may represent scarring from previous infectious/inflammatory etiology. 3. The small hypervascular lesions seen in the anterior left liver on the previous study are not evident today. There is some subtle, ill-defined hyperenhancement in the central left liver which is nonspecific. Attention to this area on followup imaging recommended. 4. Chronic portal vein inclusion with cavernous transformation in the porta hepatis and paraesophageal varices. 5.  Stable appearance hepatoduodenal and retroperitoneal lymphadenopathy, likely chronic and related to liver disease.   MR Abdomen W WO Contrast 05/21/16 IMPRESSION: 1. Limited motion degraded scan. Cirrhosis. No evidence of a liver mass within these  limitations. 2. Mild splenomegaly. No ascites. Stable mild paraumbilical and gastroesophageal varices. 3. Stable nonspecific mild retroperitoneal lymphadenopathy. 4. 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  MR Abdomen w wo contrast 08/25/2016 IMPRESSION: 1. Mild-to-moderate motion degradation, preferentially involving the pre and postcontrast dynamic images. On follow-up, given the extent of motion on this exam, multiphase CT may be preferred. 2. Given these limitations, no evidence of hepatocellular carcinoma. 3. Marked cirrhosis and portal venous hypertension. 4. Slight increase in abdominal adenopathy, which is most likely reactive. Recommend attention on follow-up. 5. Cholelithiasis.   CT Chest WO Contrast 01/08/17  IMPRESSION: 1. Stable chest CT. 2. Prominent to mildly enlarged mediastinal lymph nodes are stable. Based on stability, these are favored to be reactive. 3. Stable emphysema and right upper lobe nodularity. 4. Mild atherosclerosis.    MRI Abd W WO Contrast 01/08/17  IMPRESSION: 1. Cirrhotic liver now with severe iron deposition throughout the hepatic parenchyma, as well as areas of developing confluent hepatic fibrosis. Today's study clearly demonstrates 2 new hypervascular nodules in segment 4A of the liver, which demonstrate washout but no definite pseudocapsule, the largest which measures 1.5 cm. At this time, these are considered suspicious for hepatocellular carcinoma, bladder classified as Li-RADS 3 lesions. 2. 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 3. Cholelithiasis without evidence of acute cholecystitis at this time. 4. Mild splenomegaly.   ASSESSMENT & PLAN: 66 y.o. Caucasian male with past medical history of successfully treated hepatitis C, liver cirrhosis, history of ascites and encephalopathy, recurrent HCC   1. Recurrent HCC, initially stage  II -I previously reviewed his previous image and outside medical records extensively, confirmed key findings with patient and his family members -He initially had a multifocal disease, status post TACE 3 times in 2015 -he developed symptomatic local recurrence in 08/2015, with a 5cm new lesion in segment 8 with direct invasion into portal vein, and a portocaval lymph node. He is not a candidate for liver targeted therapy per his IR Dr. Delice Lesch -He was started on first line sorafenib, tolerated poorly. His previous CT abdomen and pelvis on 01/25/2016 revealed extensive portal hypertension, and a probable cancer progression in the liver. -His treatment has changed to immunotherapy Nivolumab on 02/04/16, he tolerated very well, and clinically he is doing much better, with less pain and better energy. Continue.  -I previously discussed his restaging CT scan which was done on 02/14/2016, it actually showed decreased liver cancer size compared to the scan a month ago. No other new metastasis. -restaging CT from 04/17/2016 showed no visible liver mass, but the imaging was very limited due to his liver cirrhosis  -I previously reviewed his recent abdominal MRI from 05/21/2016, which showed no visible mass in the liver. He has had complete radiographic response. -I reviewed his restaging MRI from 08/25/2016, which showed no visible liver mass, but the images quality with significant compromised due to the motion.  -Nivo has previously been changed to 480mg  every 4 weeks in 08/2016. Due to poor toleration I changed his nivo back to 240mg  every 2 weeks starting 01/02/17.  -His thyroid function is low at times. I previously referred him to endocrinologist, Dr. Loanne Drilling  -We discussed his MRI abd and CT chest from  01/08/17 which showed no concern for metastatic disease in the chest. MRI shows two small new lesions in his liver. I would like to review his MRI in the GI tumor board to discuss if this is truly disease progression.   -He will continue current treatment for now and will repeat scan in 2 months for close f/u. If shows disease progression will change his treatment at that time. -Labs reviewed and overall stable and adequate to proceed with nivo today and every 2 weeks  -F/u in 4 weeks    2. Abdominal pain/ back pain -Secondary to [facility] and portal vein thrombosis -overall better now with oxycodone and OxyContin, he is on very high dose, overall pain is controlled  -I previously recommended him to see pain management clinic, he declined. - I previously prescribed flexeril in attempt to help with his pain. Due to his high tolerance, he can take up to 2 a day -The patient previously stated his pain was not well controlled, I increased his OxyContin from 80 mg every 8 hours to 160 mg every 12 hours, pain is better controlled now  -He was previously taking 6 Oxycodone tablets daily. I encouraged him to cut back to 5 daily instead, he will try. -continue OxyContin 160mg  BID, oxycodone, and flexeril on board for symptom management -will continue to monitor. -Refilled oxycodone today (01/15/17)  3. 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 Since improving I suggest he reduce down to half tablet or once every other day.  -he will continue to follow up with [facility]   5. Hep C, successfully treated -Continue follow-up with liver clinic NP [facility]  6. Anxiety and insomnia  -He has Xanax for anxiety. I  previously refilled Xanax 1 mg. -continue Ativan 1 mg at bedtime as needed for insomnia, he knows not to take with Xanax together, he knows not to take during the day. He does not feel Ativan is working well for sleep -He is taking xanax for insomnia, refilled on 12/03/16  7. Fatigue and weight gain -He reports more fatigue and weak and lately -His TSH has been moderately elevated lately, we discussed the side effect of hypothyroidism from Nivolumab, I previously referred him to Dr. Loanne Drilling for evaluation to see if he needs low-dose Synthroid -He continues to gain weight and his wife is concerned. -He does not eat regularly and eats 2 ensure boosts daily along with Megace as needed.  -I do not suspect his weight gain is related to fluid retention. I suggest he hold Megace and reduce ensure boosts to once daily. He should use a balanced diet and exercise to help.  -I advised him to hold megace as he has been gaining weight   8. HTN -He is on Hydralazine 50 mg daily, and follow-up of his primary care physician  9. Smoking - Patient has started back smoking due to the fear of not getting better - I previously recommended a smoking cessation program. He refused.  10. Arthralgia -Patient has  developed significant arthralgia, possible related to Nivolumab. It has much improved this week. Exam was unremarkable. - Patient takes OxyContin to help and oxycodone prn - I advised he can take up to ibuprofen TID - If pain worsens, I may try a short course of steroid -Refilled OxyContin on 01/01/17  11. Depression - The patient reports feelings of depression related to his diagnosis. - He reports fears he is going to die soon. - I previously encouraged the patient that his recent scan shows an excellent response to treatment.  12. Iron deficient anemia  -Her hemoglobin dropped to 7.3 as of 10/16/16, from 9.4, 5 weeks prior -She is on Xarelto, no clinical overt bleeding, I'll check her iron study,  haptoglobin, LDH, to ruled out hemostasis also. -Repeated CBC showed hemoglobin 6.9, I arranged 2 units of RBC transfusion that week.  -I previously contacted his GI Dr. [name] to request EGD/colonoscopy ASAP, for possible GI bleeding  -He received IV feraheme 10/16/16 and in 10/2016 -He had GI work up with upper endoscopy and colonoscopy per Dr. [name] on 11/02/16. Biopsy of the stomach showed mild chronic gastritis, negative for H. Pylori or malignancy. Colon and rectal polyps were negative for dysplasia or malignancy. He will f/u with GI again in 01/2017.  -anemia has resolved, Hg 12.3 on 01/01/17 -if he develops anemia or GI bleeding again, will stop Xarelto    13. Goal of care discussion, DNR/DNI  -We previously discussed the incurable nature of his cancer, and the overall poor prognosis, especially if he does not have good response to chemotherapy or progress on chemo -The patient understands the goal of care is palliative. -he agrees with DNR/DNI    Plan  -refill oxycodone today  -Hold megace -Labs reviewed today and adequate to proceed with Nivo today  -Lab, flush and Nivolumab in 2 and 4 weeks -F/u in 4 weeks   All questions were answered. The patient knows to call the clinic with any problems, questions or concerns.  I spent 25 minutes counseling the patient face to face. The total time spent in the appointment was 30 minutes and more than 50% was on counseling.  This document serves as a record of services personally performed by Truitt Merle, MD. It was created on her behalf by Joslyn Devon, a trained medical scribe. The creation of this record is based on the scribe's personal observations and the provider's statements to them.    I have reviewed the above documentation for accuracy and completeness, and I agree with the above.   Truitt Merle  01/15/2017

## 2017-01-15 NOTE — Patient Instructions (Signed)
[facility] Discharge Instructions for Patients Receiving Chemotherapy  Today you received the following chemotherapy agents: Nivolumab (Opdivo)  To help prevent nausea and vomiting after your treatment, we encourage you to take your nausea medication as prescribed.    If you develop nausea and vomiting that is not controlled by your nausea medication, call the clinic.   BELOW ARE SYMPTOMS THAT SHOULD BE REPORTED IMMEDIATELY:  *FEVER GREATER THAN 100.5 F  *CHILLS WITH OR WITHOUT FEVER  NAUSEA AND VOMITING THAT IS NOT CONTROLLED WITH YOUR NAUSEA MEDICATION  *UNUSUAL SHORTNESS OF BREATH  *UNUSUAL BRUISING OR BLEEDING  TENDERNESS IN MOUTH AND THROAT WITH OR WITHOUT PRESENCE OF ULCERS  *URINARY PROBLEMS  *BOWEL PROBLEMS  UNUSUAL RASH Items with * indicate a potential emergency and should be followed up as soon as possible.  Feel free to call the clinic should you have any questions or concerns. The clinic phone number is [phone].  Please show the CHEMO ALERT CARD at check-in to the Emergency Department and triage nurse.   

## 2017-01-15 NOTE — Telephone Encounter (Signed)
Scheduled appt per 12/28 los - patient will get an updated schedule in infusion.

## 2017-01-16 LAB — T4, FREE: FREE T4: 1.25 ng/dL (ref 0.82–1.77)

## 2017-01-16 LAB — T3, FREE: Triiodothyronine,Free,Serum: 4.1 pg/mL (ref 2.0–4.4)

## 2017-01-22 MED FILL — ALPRAZolam 1 MG TABS: 1 | 7 days supply | Qty: 30 | Fill #1

## 2017-01-25 MED FILL — XARELTO 20 MG TABLET: 20 | 30 days supply | Qty: 30 | Fill #1

## 2017-01-29 ENCOUNTER — Inpatient Hospital Stay: Payer: Medicare Other

## 2017-01-29 ENCOUNTER — Other Ambulatory Visit: Payer: Self-pay | Admitting: *Deleted

## 2017-01-29 ENCOUNTER — Inpatient Hospital Stay: Payer: Medicare Other | Attending: Hematology

## 2017-01-29 ENCOUNTER — Ambulatory Visit: Payer: Medicare Other | Admitting: Hematology

## 2017-01-29 DIAGNOSIS — G8929 Other chronic pain: Secondary | ICD-10-CM | POA: Diagnosis not present

## 2017-01-29 DIAGNOSIS — F419 Anxiety disorder, unspecified: Secondary | ICD-10-CM | POA: Insufficient documentation

## 2017-01-29 DIAGNOSIS — C220 Liver cell carcinoma: Secondary | ICD-10-CM

## 2017-01-29 DIAGNOSIS — K7031 Alcoholic cirrhosis of liver with ascites: Secondary | ICD-10-CM | POA: Diagnosis not present

## 2017-01-29 DIAGNOSIS — K740 Hepatic fibrosis: Secondary | ICD-10-CM | POA: Diagnosis not present

## 2017-01-29 DIAGNOSIS — Z801 Family history of malignant neoplasm of trachea, bronchus and lung: Secondary | ICD-10-CM | POA: Insufficient documentation

## 2017-01-29 DIAGNOSIS — B182 Chronic viral hepatitis C: Secondary | ICD-10-CM | POA: Insufficient documentation

## 2017-01-29 DIAGNOSIS — D500 Iron deficiency anemia secondary to blood loss (chronic): Secondary | ICD-10-CM

## 2017-01-29 DIAGNOSIS — Z5112 Encounter for antineoplastic immunotherapy: Secondary | ICD-10-CM | POA: Diagnosis not present

## 2017-01-29 DIAGNOSIS — F17210 Nicotine dependence, cigarettes, uncomplicated: Secondary | ICD-10-CM | POA: Insufficient documentation

## 2017-01-29 DIAGNOSIS — T451X5A Adverse effect of antineoplastic and immunosuppressive drugs, initial encounter: Secondary | ICD-10-CM

## 2017-01-29 DIAGNOSIS — Z808 Family history of malignant neoplasm of other organs or systems: Secondary | ICD-10-CM | POA: Insufficient documentation

## 2017-01-29 DIAGNOSIS — Z7901 Long term (current) use of anticoagulants: Secondary | ICD-10-CM | POA: Diagnosis not present

## 2017-01-29 DIAGNOSIS — D509 Iron deficiency anemia, unspecified: Secondary | ICD-10-CM | POA: Insufficient documentation

## 2017-01-29 DIAGNOSIS — D6481 Anemia due to antineoplastic chemotherapy: Secondary | ICD-10-CM

## 2017-01-29 LAB — CBC WITH DIFFERENTIAL/PLATELET
BASOS ABS: 0 10*3/uL (ref 0.0–0.1)
Basophils Relative: 0 %
Eosinophils Absolute: 0.2 10*3/uL (ref 0.0–0.5)
Eosinophils Relative: 2 %
HEMATOCRIT: 34.5 % — AB (ref 38.4–49.9)
Hemoglobin: 11.9 g/dL — ABNORMAL LOW (ref 13.0–17.1)
LYMPHS PCT: 25 %
Lymphs Abs: 1.8 10*3/uL (ref 0.9–3.3)
MCH: 29.5 pg (ref 27.2–33.4)
MCHC: 34.5 g/dL (ref 32.0–36.0)
MCV: 85.4 fL (ref 79.3–98.0)
MONOS PCT: 10 %
Monocytes Absolute: 0.7 10*3/uL (ref 0.1–0.9)
NEUTROS ABS: 4.3 10*3/uL (ref 1.5–6.5)
Neutrophils Relative %: 63 %
Platelets: 142 10*3/uL (ref 140–400)
RBC: 4.04 MIL/uL — ABNORMAL LOW (ref 4.20–5.82)
RDW: 14.6 % (ref 11.0–15.6)
WBC: 6.9 10*3/uL (ref 4.0–10.3)

## 2017-01-29 LAB — COMPREHENSIVE METABOLIC PANEL
ALK PHOS: 103 U/L (ref 40–150)
ALT: 14 U/L (ref 0–55)
AST: 25 U/L (ref 5–34)
Albumin: 3.8 g/dL (ref 3.5–5.0)
Anion gap: 10 (ref 3–11)
BILIRUBIN TOTAL: 0.5 mg/dL (ref 0.2–1.2)
BUN: 12 mg/dL (ref 7–26)
CALCIUM: 9.1 mg/dL (ref 8.4–10.4)
CO2: 22 mmol/L (ref 22–29)
CREATININE: 1.39 mg/dL — AB (ref 0.70–1.30)
Chloride: 107 mmol/L (ref 98–109)
GFR calc Af Amer: 60 mL/min — ABNORMAL LOW (ref >60)
GFR, EST NON AFRICAN AMERICAN: 52 mL/min — AB (ref >60)
Glucose, Bld: 120 mg/dL (ref 70–140)
POTASSIUM: 3.8 mmol/L (ref 3.5–5.1)
Sodium: 139 mmol/L (ref 136–145)
TOTAL PROTEIN: 7.2 g/dL (ref 6.4–8.3)

## 2017-01-29 MED ORDER — SODIUM CHLORIDE 0.9 % IV SOLN
Freq: Once | INTRAVENOUS | Status: AC
  Administered 2017-01-29: 14:00:00 via INTRAVENOUS

## 2017-01-29 MED ORDER — HEPARIN SOD (PORK) LOCK FLUSH 100 UNIT/ML IV SOLN
500.0000 [IU] | Freq: Once | INTRAVENOUS | Status: AC | PRN
  Administered 2017-01-29: 500 [IU]
  Filled 2017-01-29: qty 5

## 2017-01-29 MED ORDER — SODIUM CHLORIDE 0.9% FLUSH
10.0000 mL | Freq: Once | INTRAVENOUS | Status: AC
  Administered 2017-01-29: 10 mL
  Filled 2017-01-29: qty 10

## 2017-01-29 MED ORDER — SODIUM CHLORIDE 0.9 % IV SOLN
240.0000 mg | Freq: Once | INTRAVENOUS | Status: AC
  Administered 2017-01-29: 240 mg via INTRAVENOUS
  Filled 2017-01-29: qty 24

## 2017-01-29 MED ORDER — SODIUM CHLORIDE 0.9% FLUSH
10.0000 mL | INTRAVENOUS | Status: DC | PRN
  Administered 2017-01-29: 10 mL
  Filled 2017-01-29: qty 10

## 2017-01-29 MED ORDER — ALPRAZOLAM 1 MG PO TABS: 1.0000 mg | ORAL_TABLET | Freq: Four times a day (QID) | ORAL | 0 refills | Status: DC | PRN

## 2017-01-29 MED ORDER — OXYCODONE HCL ER 80 MG PO T12A: 160.0000 mg | EXTENDED_RELEASE_TABLET | Freq: Two times a day (BID) | ORAL | 0 refills | Status: DC

## 2017-01-29 MED FILL — OxyCONTIN 80 MG T12A: 80 | 30 days supply | Qty: 120 | Fill #0

## 2017-01-29 MED FILL — ALPRAZolam 1 MG TABS: 1 | 30 days supply | Qty: 120 | Fill #0

## 2017-01-29 NOTE — Patient Instructions (Signed)
[facility] Discharge Instructions for Patients Receiving Chemotherapy  Today you received the following chemotherapy agents: Nivolumab (Opdivo)  To help prevent nausea and vomiting after your treatment, we encourage you to take your nausea medication as prescribed.    If you develop nausea and vomiting that is not controlled by your nausea medication, call the clinic.   BELOW ARE SYMPTOMS THAT SHOULD BE REPORTED IMMEDIATELY:  *FEVER GREATER THAN 100.5 F  *CHILLS WITH OR WITHOUT FEVER  NAUSEA AND VOMITING THAT IS NOT CONTROLLED WITH YOUR NAUSEA MEDICATION  *UNUSUAL SHORTNESS OF BREATH  *UNUSUAL BRUISING OR BLEEDING  TENDERNESS IN MOUTH AND THROAT WITH OR WITHOUT PRESENCE OF ULCERS  *URINARY PROBLEMS  *BOWEL PROBLEMS  UNUSUAL RASH Items with * indicate a potential emergency and should be followed up as soon as possible.  Feel free to call the clinic should you have any questions or concerns. The clinic phone number is [phone].  Please show the CHEMO ALERT CARD at check-in to the Emergency Department and triage nurse.   

## 2017-01-29 NOTE — Patient Instructions (Signed)
Implanted Port Home Guide An implanted port is a type of central line that is placed under the skin. Central lines are used to provide IV access when treatment or nutrition needs to be given through a person's veins. Implanted ports are used for long-term IV access. An implanted port may be placed because:  You need IV medicine that would be irritating to the small veins in your hands or arms.  You need long-term IV medicines, such as antibiotics.  You need IV nutrition for a long period.  You need frequent blood draws for lab tests.  You need dialysis.  Implanted ports are usually placed in the chest area, but they can also be placed in the upper arm, the abdomen, or the leg. An implanted port has two main parts:  Reservoir. The reservoir is round and will appear as a small, raised area under your skin. The reservoir is the part where a needle is inserted to give medicines or draw blood.  Catheter. The catheter is a thin, flexible tube that extends from the reservoir. The catheter is placed into a large vein. Medicine that is inserted into the reservoir goes into the catheter and then into the vein.  How will I care for my incision site? Do not get the incision site wet. Bathe or shower as directed by your health care provider. How is my port accessed? Special steps must be taken to access the port:  Before the port is accessed, a numbing cream can be placed on the skin. This helps numb the skin over the port site.  Your health care provider uses a sterile technique to access the port. ? Your health care provider must put on a mask and sterile gloves. ? The skin over your port is cleaned carefully with an antiseptic and allowed to dry. ? The port is gently pinched between sterile gloves, and a needle is inserted into the port.  Only "non-coring" port needles should be used to access the port. Once the port is accessed, a blood return should be checked. This helps ensure that the port  is in the vein and is not clogged.  If your port needs to remain accessed for a constant infusion, a clear (transparent) bandage will be placed over the needle site. The bandage and needle will need to be changed every week, or as directed by your health care provider.  Keep the bandage covering the needle clean and dry. Do not get it wet. Follow your health care provider's instructions on how to take a shower or bath while the port is accessed.  If your port does not need to stay accessed, no bandage is needed over the port.  What is flushing? Flushing helps keep the port from getting clogged. Follow your health care provider's instructions on how and when to flush the port. Ports are usually flushed with saline solution or a medicine called heparin. The need for flushing will depend on how the port is used.  If the port is used for intermittent medicines or blood draws, the port will need to be flushed: ? After medicines have been given. ? After blood has been drawn. ? As part of routine maintenance.  If a constant infusion is running, the port may not need to be flushed.  How long will my port stay implanted? The port can stay in for as long as your health care provider thinks it is needed. When it is time for the port to come out, surgery will be   done to remove it. The procedure is similar to the one performed when the port was put in. When should I seek immediate medical care? When you have an implanted port, you should seek immediate medical care if:  You notice a bad smell coming from the incision site.  You have swelling, redness, or drainage at the incision site.  You have more swelling or pain at the port site or the surrounding area.  You have a fever that is not controlled with medicine.  This information is not intended to replace advice given to you by your health care provider. Make sure you discuss any questions you have with your health care provider. Document  Released: 01/05/2005 Document Revised: 06/13/2015 Document Reviewed: 09/12/2012 Elsevier Interactive Patient Education  2017 [employer]  

## 2017-01-29 NOTE — Patient Instructions (Signed)
[facility] Discharge Instructions for Patients Receiving Chemotherapy  Today you received the following chemotherapy agents: Nivolumab (Opdivo)  To help prevent nausea and vomiting after your treatment, we encourage you to take your nausea medication as prescribed.    If you develop nausea and vomiting that is not controlled by your nausea medication, call the clinic.   BELOW ARE SYMPTOMS THAT SHOULD BE REPORTED IMMEDIATELY:  *FEVER GREATER THAN 100.5 F  *CHILLS WITH OR WITHOUT FEVER  NAUSEA AND VOMITING THAT IS NOT CONTROLLED WITH YOUR NAUSEA MEDICATION  *UNUSUAL SHORTNESS OF BREATH  *UNUSUAL BRUISING OR BLEEDING  TENDERNESS IN MOUTH AND THROAT WITH OR WITHOUT PRESENCE OF ULCERS  *URINARY PROBLEMS  *BOWEL PROBLEMS  UNUSUAL RASH Items with * indicate a potential emergency and should be followed up as soon as possible.  Feel free to call the clinic should you have any questions or concerns. The clinic phone number is [phone].  Please show the CHEMO ALERT CARD at check-in to the Emergency Department and triage nurse.   

## 2017-01-30 LAB — AFP TUMOR MARKER: AFP, Serum, Tumor Marker: 4.5 ng/mL (ref 0.0–8.3)

## 2017-02-02 ENCOUNTER — Ambulatory Visit: Payer: Medicare Other | Admitting: Gastroenterology

## 2017-02-11 ENCOUNTER — Telehealth: Payer: Self-pay | Admitting: Hematology

## 2017-02-11 NOTE — Progress Notes (Signed)
[place]  [phone] [phone]  Clinic Follow up Note   Patient Care Team: Antonietta Jewel, MD as PCP - General (Internal Medicine) [name], Cristopher Estimable, MD as Attending Physician (Gastroenterology) Antonietta Jewel, MD as Referring Physician (Internal Medicine) Truitt Merle, MD as Consulting Physician (Hematology) [name], CRNP as Nurse Practitioner (Nurse Practitioner)   Date of Service:  02/12/2017   CHIEF COMPLAINTS:  Follow up recurrent [place]   Oncology History   Hepatocellular carcinoma [facility])   Staging form: Liver (Excluding Intrahepatic Bile Ducts), AJCC 7th Edition   - Clinical stage from 04/16/2012: Stage II (T2(m), N0, M0) - Signed by Truitt Merle, MD on 10/12/2015   - Pathologic stage from 04/16/2013: Stage II (T2, N0, cM0) - Signed by Truitt Merle, MD on 10/12/2015      Hepatocellular carcinoma ([place])   05/1927 Imaging         09/14/2012 Tumor Marker    AFP 28.9      04/16/2013 Imaging    Abdominal MRI with and without contrast reviewed multifocal (4) liver lesions in both left and right lobes, most consistent with HCC, largest measuring 2.7 cm      04/16/2013 Initial Diagnosis    Hepatocellular carcinoma ([place])      04/28/2013 Procedure    Right TACE with lipiodol       06/15/2013 Procedure    Left TACE with [facility]       07/14/2013 Procedure    Right TACE with [facility]      09/05/2015 Imaging    CT abdomen with and without contrast showed a new 5.0 x 2.3 cm mass in the hepatic segment 8, invading and occluding the right anterior portal vein, most compatible with [place]. A portacaval node measuring 3.0 x 1.4 cm, previously 2.9 x 1.1 cm.      10/11/2015 Tumor Marker    AFP 5.3      11/11/2015 - 02/03/2016 Chemotherapy    sorafenib 200mg  bid started on 11/11/2015, increased to 400mg  bid in 2 weeks, stopped due to poor tolerance and probable disease progression       11/14/2015 Imaging    CT CAP 11/14/2015 IMPRESSION: Chest Impression: 1. RIGHT  upper lobe pulmonary nodule is indeterminate. No comparison available. 2. Ground-glass opacity and peripheral nodules in the RIGHT middle lobe appear post infectious or inflammatory.  Abdomen / Pelvis Impression: 1. 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 Thrombus also likely within the LEFT and RIGHT portal vein. Difficult to distinguish tumor thrombus versus bland thrombus. 2. Individual lesions are difficult to define in the RIGHT hepatic lobe. There is overall impression of progression of disease in the RIGHT hepatic lobe with multiple ill-defined enhancing lesions. 3. Periportal and shotty retroperitoneal adenopathy similar to prior.      01/25/2016 Imaging    CT Abdomen Pelvis w/ Contrast IMPRESSION: 1. Findings consistent with multifocal liver carcinoma with extensive portal vein thrombosis, and subsequent development of porta hepatis venous collaterals. Thrombus in the central portal vein is no longer expansile, but still expands the more peripheral portal vein and the right and left portal veins. There are few right liver bile ducts there are now dilated, which suggests mild progression of liver disease. 2. There is mild splenomegaly consistent or venous hypertension, which has mildly increased from the prior CT. 3. There are new lung abnormalities with interstitial thickening and ill-defined peribronchovascular nodular opacities mostly at  the right lung base with a smaller area noted along the medial left lower lobe. The etiology may be infectious. It may be inflammatory, possibly related to chemotherapy. Neoplastic disease is also possible. 4. There is no other evidence of metastatic disease within the abdomen or pelvis. No acute findings within the abdomen or pelvis      01/25/2016 - 01/25/2016 Hospital Admission    Pt was seen in ED for worsening abdominal pain, treated with  pain meds       02/04/2016 -  Chemotherapy    The patient started on immunotherapy treatment nivolumab every 2 weeks; on 04/30/16 dose was reduced to 420mg  every 3 weeks       02/14/2016 Imaging    CT CAP w/ contrast 1. Slight interval increase in size of the mediastinal lymph nodes. 2. Persistent extensive lower lobe peribronchial thickening and patchy infiltrates. Possible aspiration pneumonia. 3. Persistent tree-in-bud appearance in the right middle lobe, likely chronic inflammation or atypical infection. 4. Improved CT appearance of the liver. Two small residual arterial phase enhancing lesions are identified. 5. Chronic portal vein thrombosis with cavernous transformation of portal venous collaterals. Esophageal varices are again noted. 6. Stable upper abdominal lymph nodes likely related to cirrhosis.       04/16/2016 Tumor Marker    AFP 4.1       04/17/2016 Imaging     CT CAP W PELVIS IMPRESSION: 1. Stable mild mediastinal lymphadenopathy. 2. Interval improvement in the bibasilar peribronchial thickening, airway impaction, and peribronchovascular nodularity. Imaging features suggest marked improvement in bilateral low the atypical infection. Changes in the right middle lobe with more stable and may represent scarring from previous infectious/inflammatory etiology. 3. The small hypervascular lesions seen in the anterior left liver on the previous study are not evident today. There is some subtle, ill-defined hyperenhancement in the central left liver which is nonspecific. Attention to this area on followup imaging recommended. 4. Chronic portal vein inclusion with cavernous transformation in the porta hepatis and paraesophageal varices. 5. Stable appearance hepatoduodenal and retroperitoneal lymphadenopathy, likely chronic and related to liver disease.      04/17/2016 Imaging    CT CAP CONTRAST IMPRESSION: 1. Stable mild mediastinal lymphadenopathy. 2. Interval  improvement in the bibasilar peribronchial thickening, airway impaction, and peribronchovascular nodularity. Imaging features suggest marked improvement in bilateral low the atypical infection. Changes in the right middle lobe with more stable and may represent scarring from previous infectious/inflammatory etiology. 3. The small hypervascular lesions seen in the anterior left liver on the previous study are not evident today. There is some subtle, ill-defined hyperenhancement in the central left liver which is nonspecific. Attention to this area on followup imaging recommended. 4. Chronic portal vein inclusion with cavernous transformation in the porta hepatis and paraesophageal varices. 5. Stable appearance hepatoduodenal and retroperitoneal lymphadenopathy, likely chronic and related to liver disease.      05/21/2016 Imaging    MR Abdomen W WO Contrast IMPRESSION: 1. Limited motion degraded scan. Cirrhosis. No evidence of a liver mass within these limitations. 2. Mild splenomegaly. No ascites. Stable mild paraumbilical and gastroesophageal varices. 3. Stable nonspecific mild retroperitoneal lymphadenopathy. 4. 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      06/04/2016 Tumor Marker    AFP 3.5       08/25/2016 Imaging    MR Abdomen w wo contrast  IMPRESSION: 1. Mild-to-moderate motion degradation, preferentially involving the pre and postcontrast dynamic images. On follow-up, given the extent  of motion on this exam, multiphase CT may be preferred. 2. Given these limitations, no evidence of hepatocellular carcinoma. 3. Marked cirrhosis and portal venous hypertension. 4. Slight increase in abdominal adenopathy, which is most likely reactive. Recommend attention on follow-up. 5. Cholelithiasis.       11/02/2016 Procedure    Upper endoscopy and colonoscopy  Diagnosis 1. Stomach, biopsy - MILD CHRONIC GASTRITIS. - NEGATIVE FOR  HELICOBACTER PYLORI. - NO INTESTINAL METAPLASIA, DYSPLASIA, OR MALIGNANCY. 2. Colon, polyp(s), opposite ileocecal valve - TUBULAR ADENOMA. - NO HIGH GRADE DYSPLASIA OR MALIGNANCY. 3. Rectum, polyp(s) - HYPERPLASTIC POLYP. - NO DYSPLASIA OR MALIGNANCY.      01/08/2017 Imaging    CT Chest WO Contrast 01/08/17  IMPRESSION: 1. Stable chest CT. 2. Prominent to mildly enlarged mediastinal lymph nodes are stable. Based on stability, these are favored to be reactive. 3. Stable emphysema and right upper lobe nodularity. 4. Mild atherosclerosis.      01/08/2017 Imaging    MRI Abd W WO Contrast 01/08/17  IMPRESSION: 1. Cirrhotic liver now with severe iron deposition throughout the hepatic parenchyma, as well as areas of developing confluent hepatic fibrosis. Today's study clearly demonstrates 2 new hypervascular nodules in segment 4A of the liver, which demonstrate washout but no definite pseudocapsule, the largest which measures 1.5 cm. At this time, these are considered suspicious for hepatocellular carcinoma, bladder classified as Li-RADS 3 lesions. 2. 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 3. Cholelithiasis without evidence of acute cholecystitis at this time. 4. Mild splenomegaly.       HISTORY OF PRESENTING ILLNESS (10/11/2015):  Kevin Dillon 67 y.o. male with past medical history of treated hepatitis C, liver cirrhosis, and hepatocellular carcinoma is here because of recurrent hepatocellular carcinoma. He is accompanied by his significant other [name] and her sister.  He was diagnosed with hepatocellular carcinoma in 2015, his initial image result is not available and staging is unknown. He was seen by interventional radiologist Dr. Delice Lesch at [facility] in [place] and underwent TACE procedure 3 times in 2015. He was subsequently followed, and recently repeated CT scan showed a new 5.0 cm mass in segment 8, with portal vein  invasion. He was felt not to be a candidate for further liver targeted therapy, and was referred to [place] for further systemic therapy.  He complains about mid epigastric pain for the past 2 years, which significantly improved after his TACE procedure in 2015. It has been getting worse lately in the past 6 months. He states he gets about 7-8 out of 10, persistent, he has been taking oxycodone 20 mg every 6 hours, but his pain is not well controlled. He is quite fatigued, is low appetite. He last 40 lbs in the pat 4 months. He is able to function at home, in trying to remain to be physically active.  His hepatitis C was successfully treated. He has history of hepatitis C, complicated with ascites and encephalopathy, but it has been well controlled with medical management.   CURRENT THERAPY:  Nivolumab 240mg  every 2 weeks, started on 02/04/2016; on 04/30/16 switched to every 3 weeks due to fatigue; change from 240mg  to 480mg  every 4 weeks starting 09/03/2016. Given his poor toleration, will reduce him to every 2 weeks at 240mg  starting 01/02/17   INTERIM HISTORY:   [place] returns for follow up. He is accompanied by his wife today. He notes he is doing well overall. He notes that he still has to push  to get off of the couch. He notes that he has had improved back pain. His wife voices concern for the patient's recent imaging findings. He reports that he is out of the oxycodone and he would like to decrease the dose from 30 mg to 20 mg. He voices that he rans out of the 30 mg 1 week ago and his back pain has improved since then. He reports that he sometimes has to take 5 oxycodone a day to control his pain.  Since his last visit to the office, he underwent MR Abdomen on 01/08/2017 with results showing: IMPRESSION: 1. Cirrhotic liver now with severe iron deposition throughout the hepatic parenchyma, as well as areas of developing confluent hepatic fibrosis. Today's study clearly demonstrates 2 new hypervascular  nodules in segment 4A of the liver, which demonstrate washout but no definite pseudocapsule, the largest which measures 1.5 cm. At this time, these are considered suspicious for hepatocellular carcinoma, bladder classified as Li-RADS 3 lesions. 2. 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 3. Cholelithiasis without evidence of acute cholecystitis at this time. 4. Mild splenomegaly..   On review of systems, pt reports fatigue and back pain. He denies and any other symptoms.    MEDICAL HISTORY:  Past Medical History:  Diagnosis Date  . Cancer ([place])    liver  . Cholelithiasis   . Chronic hepatitis C ([place])   . Chronic knee pain   . Chronic left shoulder pain   . CVA (cerebral infarction)   . Gout   . Hepatitis C    genotype 1b.  pt has been vaccinated against hep A and B.  . Hypertension   . Osteoarthritis   . Schatzki's ring   . Tubular adenoma of colon 12/2011    SURGICAL HISTORY: Past Surgical History:  Procedure Laterality Date  . AMPUTATION Left 09/17/2012   Procedure: SMALL FINGER EXTENSOR TENDON REPAIR; METACARPAL LEVEL AMPUTATION RING FINGER; PROXIMAL PHALANX LEVEL AMPUTATION LONG FINGER;  Surgeon: Schuyler Amor, MD;  Location: [place];  Service: Orthopedics;  Laterality: Left;  . Arm surgery     right/plate in arm  . BIOPSY  11/02/2016   Procedure: BIOPSY;  Surgeon: Daneil Dolin, MD;  [facility];  Service: Endoscopy;;  gastric   . COLONOSCOPY WITH ESOPHAGOGASTRODUODENOSCOPY (EGD)  12/30/2011   RMR: Noncritical Schatzki's ring;  Hiatal hernia, Tubular ADENOMA removed from splenic flexure, otherwise normal colonoscopy  . COLONOSCOPY WITH PROPOFOL N/A 11/02/2016   Procedure: COLONOSCOPY WITH PROPOFOL;  Surgeon: Daneil Dolin, MD;  [facility];  Service: Endoscopy;  Laterality: N/A;  1045  . ESOPHAGOGASTRODUODENOSCOPY (EGD) WITH PROPOFOL N/A 11/02/2016   Procedure: ESOPHAGOGASTRODUODENOSCOPY (EGD) WITH  PROPOFOL;  Surgeon: Daneil Dolin, MD;  [facility];  Service: Endoscopy;  Laterality: N/A;  . IR FLUORO GUIDE PORT INSERTION RIGHT  10/21/2016  . IR US GUIDE VASC ACCESS RIGHT  10/21/2016  . LEG SURGERY     left  . POLYPECTOMY  11/02/2016   Procedure: POLYPECTOMY;  Surgeon: Daneil Dolin, MD;  [facility];  Service: Endoscopy;;  colon  . WOUND EXPLORATION Left 09/17/2012   Procedure: WOUND EXPLORATION;  Surgeon: Schuyler Amor, MD;  Location: [place];  Service: Orthopedics;  Laterality: Left;    SOCIAL HISTORY: Social History   Socioeconomic History  . Marital status: Single    Spouse name: Not on file  . Number of children: 0  . Years of  education: Not on file  . Highest education level: Not on file  Social Needs  . Financial resource strain: Not on file  . Food insecurity - worry: Not on file  . Food insecurity - inability: Not on file  . Transportation needs - medical: Not on file  . Transportation needs - non-medical: Not on file  Occupational History  . Occupation: disabled  Tobacco Use  . Smoking status: Former Smoker    Packs/day: 0.50    Years: 40.00    Pack years: 20.00    Types: Cigarettes    Last attempt to quit: 01/19/2012    Years since quitting: 5.0  . Smokeless tobacco: Never Used  . Tobacco comment: Smokes 1/2 pack of cigarettes daily  Substance and Sexual Activity  . Alcohol use: No    Comment: HX 2 beers 3-4 days per week; QUIT DEC 2013  . Drug use: No    Comment: Hx cocaine yrs ago, Last marijuana OCT 2013  . Sexual activity: Not Currently    Partners: Female  Other Topics Concern  . Not on file  Social History Narrative   Lives w/ significant other, [name]    FAMILY HISTORY: Family History  Problem Relation Age of Onset  . Cirrhosis Father 77  . Cancer Father        liver cancer   . Lung cancer Mother 61  . Colon cancer Neg Hx     ALLERGIES:  has No Known Allergies.  MEDICATIONS:  Current  Outpatient Medications  Medication Sig Dispense Refill  . ALPRAZolam (XANAX) 1 MG tablet Take 1 tablet (1 mg total) by mouth every 6 (six) hours as needed for anxiety. 120 tablet 0  . cyclobenzaprine (FLEXERIL) 5 MG tablet Take 1 tablet (5 mg total) by mouth 3 (three) times daily as needed for muscle spasms. 30 tablet 0  . hydrALAZINE (APRESOLINE) 25 MG tablet Take 75 mg by mouth daily.     Marland Kitchen lactulose (CHRONULAC) 10 GM/15ML solution Take 30 g by mouth daily as needed for moderate constipation.     . lidocaine-prilocaine (EMLA) cream Apply 1 application topically as needed. Apply to portacath 1 1/2 hours - 2 hours prior to procedures as needed. 30 g 1  . megestrol (MEGACE) 40 MG/ML suspension Take 400 mg by mouth daily.     . Multiple Vitamins-Minerals (CENTRUM SILVER PO) Take 1 tablet by mouth daily.     Marland Kitchen oxyCODONE (OXYCONTIN) 80 mg 12 hr tablet Take 2 tablets (160 mg total) by mouth every 12 (twelve) hours. 120 tablet 0  . Oxycodone HCl 20 MG TABS Take 1 tablet (20 mg total) by mouth every 4 (four) hours as needed. 120 tablet 0  . prochlorperazine (COMPAZINE) 10 MG tablet TAKE 1 TABLET BY MOUTH EVERY 8 HOURS AS NEEDED FOR NAUSEA/VOMITING (Patient taking differently: TAKE 10 MG BY MOUTH EVERY 8 HOURS AS NEEDED FOR NAUSEA/VOMITING) 30 tablet 1  . rivaroxaban (XARELTO) 20 MG TABS tablet TAKE 1 TABLET BY MOUTH DAILY WITH SUPPER. 30 tablet 3   No current facility-administered medications for this visit.    Facility-Administered Medications Ordered in Other Visits  Medication Dose Route Frequency Provider Last Rate Last Dose  . sodium chloride flush (NS) 0.9 % injection 10 mL  10 mL Intracatheter PRN Truitt Merle, MD   10 mL at 02/12/17 1618    REVIEW OF SYSTEMS:  Constitutional: Denies fevers, chills or abnormal night sweats  (+) weight gain Eyes: Denies blurriness of vision, double vision  or watery eyes Ears, nose, mouth, throat, and face: Denies mucositis or sore throat Respiratory: Denies  cough, dyspnea or wheezes Cardiovascular: Denies palpitation, chest discomfort or lower extremity swelling Gastrointestinal:  Denies nausea, heartburn or change in bowel habits,  Skin: Denies abnormal skin rashes Lymphatics: Denies new lymphadenopathy or easy bruising Neurological:Denies numbness, tingling or new weaknesses Behavioral/Psych: normal Musculoskeletal: (+) back pain (+) lower extremity myalgia pain (+) pain controlled  All other systems were reviewed with the patient and are negative.  PHYSICAL EXAMINATION  ECOG PERFORMANCE STATUS: 2  Vitals:   02/12/17 1415  BP: (!) 155/71  Pulse: 91  Resp: 18  Temp: 97.7 F (36.5 C)  SpO2: 96%   Filed Weights   02/12/17 1415  Weight: 208 lb (94.3 kg)     GENERAL:alert, no distress and comfortable SKIN: skin color, texture, turgor are normal, no rashes or significant lesions EYES: normal, conjunctiva are pink and non-injected, sclera clear OROPHARYNX:no exudate, no erythema and lips, buccal mucosa, and tongue normal  NECK: supple, thyroid normal size, non-tender, without nodularity LYMPH:  no palpable lymphadenopathy in the cervical, axillary or inguinal LUNGS: clear to auscultation and percussion with normal breathing effort HEART: regular rate & rhythm and no murmurs and no lower extremity edema ABDOMEN:(+) abdominal bloating, no tenderness Musculoskeletal:no cyanosis of digits and no clubbing  PSYCH: alert & oriented x 3 with fluent speech NEURO: no focal motor/sensory deficits  LABORATORY DATA:  I have reviewed the data as listed CBC Latest Ref Rng & Units 02/12/2017 01/29/2017 01/15/2017  WBC 4.0 - 10.3 K/uL 7.5 6.9 6.6  Hemoglobin 13.0 - 17.1 g/dL 12.0(L) 11.9(L) 11.9(L)  Hematocrit 38.4 - 49.9 % 35.2(L) 34.5(L) 34.9(L)  Platelets 140 - 400 K/uL 176 142 122(L)   CMP Latest Ref Rng & Units 02/12/2017 01/29/2017 01/15/2017  Glucose 70 - 140 mg/dL 117 120 123  BUN 7 - 26 mg/dL 15 12 12.3  Creatinine 0.70 - 1.30 mg/dL  1.33(H) 1.39(H) 1.3  Sodium 136 - 145 mmol/L 138 139 140  Potassium 3.5 - 5.1 mmol/L 3.7 3.8 3.6  Chloride 98 - 109 mmol/L 107 107 -  CO2 22 - 29 mmol/L 21(L) 22 24  Calcium 8.4 - 10.4 mg/dL 9.3 9.1 9.2  Total Protein 6.4 - 8.3 g/dL 7.3 7.2 7.1  Total Bilirubin 0.2 - 1.2 mg/dL 0.6 0.5 0.49  Alkaline Phos 40 - 150 U/L 109 103 98  AST 5 - 34 U/L 29 25 26   ALT 0 - 55 U/L 15 14 18     AFP:  09/14/2012: 28.9 10/11/2015: 5.3 11/01/2015: 3.7 12/02/2015: 3.7 01/22/2016: 3.4 02/18/2016: 7.1 03/17/16: 5.8 04/16/16: 4.1 06/04/2016: 3.5 07/16/2016:4.4 08/27/2016: 5.6 10/08/16: 4.3 11/05/16: 4.8 12/03/16: 5.4 01/01/17: 5.1 01/29/17: 4.5   PATHOLOGY   Diagnosis 11/02/16  1. Stomach, biopsy - MILD CHRONIC GASTRITIS. - NEGATIVE FOR HELICOBACTER PYLORI. - NO INTESTINAL METAPLASIA, DYSPLASIA, OR MALIGNANCY. 2. Colon, polyp(s), opposite ileocecal valve - TUBULAR ADENOMA. - NO HIGH GRADE DYSPLASIA OR MALIGNANCY. 3. Rectum, polyp(s) - HYPERPLASTIC POLYP. - NO DYSPLASIA OR MALIGNANCY. Microscopic Comment 1. A Warthin-Starry stain is performed to determine the possibility of the presence of Helicobacter pylori. The Warthin-Starry stain is negative for organisms of Helicobacter pylori.   PROCEDURE  Upper Endoscopy by Dr. [name] 11/02/16  IMPRESSION:  - mildly obstructing Schatzki ring. - dilated with scope passage. erosive reflux esophagitis - Small hiatal hernia. - Portal hypertensive gastropathy. biopsied - Normal duodenal bulb and second portion of the duodenum. Biopsied.   Colonoscopy by Dr.  Rourk 11/02/16  IMPRESSION:  - Two 3 to 5 mm polyps in the rectum and at the ileocecal valve, removed with a cold snare. Resected and retrieved. - Diverticulosis in the sigmoid colon and in the descending colon. - The examination was otherwise normal on direct and retroflexion views.   RADIOGRAPHIC STUDIES: I have personally reviewed the radiological images as listed and agreed with the findings  in the report.  04/17/16 CT CAP W PELVIS IMPRESSION: 1. Stable mild mediastinal lymphadenopathy. 2. Interval improvement in the bibasilar peribronchial thickening, airway impaction, and peribronchovascular nodularity. Imaging features suggest marked improvement in bilateral low the atypical infection. Changes in the right middle lobe with more stable and may represent scarring from previous infectious/inflammatory etiology. 3. The small hypervascular lesions seen in the anterior left liver on the previous study are not evident today. There is some subtle, ill-defined hyperenhancement in the central left liver which is nonspecific. Attention to this area on followup imaging recommended. 4. Chronic portal vein inclusion with cavernous transformation in the porta hepatis and paraesophageal varices. 5. Stable appearance hepatoduodenal and retroperitoneal lymphadenopathy, likely chronic and related to liver disease.   MR Abdomen W WO Contrast 05/21/16 IMPRESSION: 1. Limited motion degraded scan. Cirrhosis. No evidence of a liver mass within these limitations. 2. Mild splenomegaly. No ascites. Stable mild paraumbilical and gastroesophageal varices. 3. Stable nonspecific mild retroperitoneal lymphadenopathy. 4. 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  MR Abdomen w wo contrast 08/25/2016 IMPRESSION: 1. Mild-to-moderate motion degradation, preferentially involving the pre and postcontrast dynamic images. On follow-up, given the extent of motion on this exam, multiphase CT may be preferred. 2. Given these limitations, no evidence of hepatocellular carcinoma. 3. Marked cirrhosis and portal venous hypertension. 4. Slight increase in abdominal adenopathy, which is most likely reactive. Recommend attention on follow-up. 5. Cholelithiasis.   CT Chest WO Contrast 01/08/17  IMPRESSION: 1. Stable chest CT. 2. Prominent to mildly enlarged  mediastinal lymph nodes are stable. Based on stability, these are favored to be reactive. 3. Stable emphysema and right upper lobe nodularity. 4. Mild atherosclerosis.    MRI Abd W WO Contrast 01/08/17  IMPRESSION: 1. Cirrhotic liver now with severe iron deposition throughout the hepatic parenchyma, as well as areas of developing confluent hepatic fibrosis. Today's study clearly demonstrates 2 new hypervascular nodules in segment 4A of the liver, which demonstrate washout but no definite pseudocapsule, the largest which measures 1.5 cm. At this time, these are considered suspicious for hepatocellular carcinoma, bladder classified as Li-RADS 3 lesions. 2. 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 3. Cholelithiasis without evidence of acute cholecystitis at this time. 4. Mild splenomegaly.   ASSESSMENT & PLAN: 67 y.o. Caucasian male with past medical history of successfully treated hepatitis C, liver cirrhosis, history of ascites and encephalopathy, recurrent HCC   1. Recurrent HCC, initially stage II -I previously reviewed his previous image and outside medical records extensively, confirmed key findings with patient and his family members -He initially had a multifocal disease, status post TACE 3 times in 2015 -he developed symptomatic local recurrence in 08/2015, with a 5cm new lesion in segment 8 with direct invasion into portal vein, and a portocaval lymph node. He is not a candidate for liver targeted therapy per his IR Dr. Delice Lesch -He was started on first line sorafenib, tolerated poorly. His previous CT abdomen and pelvis on 01/25/2016 revealed extensive portal hypertension, and a probable cancer progression in the liver. -  His treatment has changed to immunotherapy Nivolumab on 02/04/16, he tolerated very well, and clinically he is doing much better, with less pain and better energy. Continue.  -I previously discussed his restaging CT scan which  was done on 02/14/2016, it actually showed decreased liver cancer size compared to the scan a month ago. No other new metastasis. -restaging CT from 04/17/2016 showed no visible liver mass, but the imaging was very limited due to his liver cirrhosis  -I previously reviewed his recent abdominal MRI from 05/21/2016, which showed no visible mass in the liver. He has had complete radiographic response. -I reviewed his restaging MRI from 08/25/2016, which showed no visible liver mass, but the images quality with significant compromised due to the motion.  -Nivo has previously been changed to 480mg  every 4 weeks in 08/2016. Due to poor toleration I changed his nivo back to 240mg  every 2 weeks starting 01/02/17.  -His thyroid function is low at times. I previously referred him to endocrinologist, Dr. Loanne Drilling  -We discussed his MRI abd and CT chest from 01/08/17 which showed no concern for metastatic disease in the chest. MRI shows two small new lesions in his liver. I would like to review his MRI in the GI tumor board to discuss if this is truly disease progression.  -He will continue current treatment for now and will repeat scan in 4 weeks for close f/u. If shows disease progression will change his treatment at that time.  He does scan was recently reviewed in our GI tumor board. -Labs reviewed and overall stable and adequate to proceed with nivo today and every 2 weeks  -F/u in 4 weeks  -MRI abd w wo in 3-4 weeks    2. Abdominal pain/ back pain -Secondary to [facility] and portal vein thrombosis -overall better now with oxycodone and OxyContin, he is on very high dose, overall pain is controlled  -I previously recommended him to see pain management clinic, he declined. - I previously prescribed flexeril in attempt to help with his pain. Due to his high tolerance, he can take up to 2 a day -The patient previously stated his pain was not well controlled, I increased his OxyContin from 80 mg every 8 hours to 160 mg  every 12 hours, pain is better controlled now  -He was previously taking 6 Oxycodone tablets daily. I encouraged him to cut back to 5 daily instead, he will try. -continue OxyContin 160mg  BID, oxycodone, and flexeril on board for symptom management -will continue to monitor. -Refilled oxycodone today (01/15/17) -Advised the patient to remain active to aid with increased fatigue.  -Will change dose of oxycodone from 30 mg to 20 mg today. Sent refill to patient pharmacy  3. 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 Since improving I suggest he reduce down to half tablet or once every other day.  -he will continue to follow up with [facility]  -He reports that he is not taking lasix as prescribed at this time.  5. Hep C, successfully treated -Continue follow-up with liver clinic NP [facility]  6. Anxiety and insomnia  -He has Xanax for anxiety. I previously refilled Xanax 1 mg. -continue Ativan 1 mg at bedtime as needed for insomnia, he knows not to take with Xanax together, he knows not to take during the day. He does not feel Ativan is working well for sleep -He is taking xanax for insomnia, refilled on 12/03/16  7. Fatigue and weight gain -He reports more fatigue and weak and lately -His TSH has been moderately elevated lately, we discussed the side effect of hypothyroidism from Nivolumab, I previously referred him to Dr. Loanne Drilling for evaluation to see if he needs low-dose  Synthroid -He continues to gain weight and his wife is concerned. -He does not eat regularly and eats 2 ensure boosts daily along with Megace as needed.  -I do not suspect his weight gain is related to fluid retention. I suggest he hold Megace and reduce ensure boosts to once daily. He should use a balanced diet and exercise to help.  -I previously advised him to hold megace as he has been gaining weight   8. HTN -He is on Hydralazine 50 mg daily, and follow-up of his primary care physician  9. Smoking - Patient has started back smoking due to the fear of not getting better - I previously recommended a smoking cessation program. He refused.  10. Arthralgia -Patient has developed significant arthralgia, possible related to Nivolumab. It has much improved this week. Exam was unremarkable. - Patient takes OxyContin to help and oxycodone prn - I previously advised he can take up to ibuprofen TID - If pain worsens, I may try a short course of steroid -Refilled OxyContin on 01/01/17  11. Depression - The patient reports feelings of depression related to his diagnosis. - He reports fears he is going to die soon. - I previously encouraged the patient that his recent scan shows an excellent response to treatment.  12. Iron deficient anemia  -Her hemoglobin dropped to 7.3 as of 10/16/16, from 9.4, 5 weeks prior -She is on Xarelto, no clinical overt bleeding, I'll check her iron study, haptoglobin, LDH, to ruled out hemostasis also. -Repeated CBC showed hemoglobin 6.9, I arranged 2 units of RBC transfusion that week.  -I previously contacted his GI Dr. [name] to request EGD/colonoscopy ASAP, for possible GI bleeding  -He received IV feraheme 10/16/16 and in 10/2016 -He had GI work up with upper endoscopy and colonoscopy per Dr. [name] on 11/02/16. Biopsy of the stomach showed mild chronic gastritis, negative for H. Pylori or malignancy. Colon and rectal polyps were negative for dysplasia or  malignancy. He will f/u with GI again in 01/2017.  -anemia has resolved, Hg 12.3 on 01/01/17 -if he develops anemia or GI bleeding again, will stop Xarelto    13. Goal of care discussion, DNR/DNI  -We previously discussed the incurable nature of his cancer, and the overall poor prognosis, especially if he does not have good response to chemotherapy or progress on chemo -The patient understands the goal of care is palliative. -he agrees with DNR/DNI    Plan  Refill oxycodone today at decreased dose from 30mg  to 20 mg, q4h as needed   Labs reviewed today and adequate to proceed with Nivo today Lab and nivolumab infusion in 2 and 4 weeks F/u in 4 weeks  MRI abd w wo in 3-4 weeks    All questions were answered. The patient knows to call the clinic  with any problems, questions or concerns.  I spent 20 minutes counseling the patient face to face. Pt's partner had many questions, and I answered to her satisfaction.  The total time spent in the appointment was 30 minutes and more than 50% was on counseling.  This document serves as a record of services personally performed by Truitt Merle, MD. It was created on her behalf by Steva Colder, a trained medical scribe. The creation of this record is based on the scribe's personal observations and the provider's statements to them.   I have reviewed the above documentation for accuracy and completeness, and I agree with the above.    Truitt Merle  02/12/2017

## 2017-02-11 NOTE — Telephone Encounter (Signed)
Spoke to patient to confirm appointment for 1/25 per voicemail

## 2017-02-12 ENCOUNTER — Encounter: Payer: Self-pay | Admitting: Hematology

## 2017-02-12 ENCOUNTER — Inpatient Hospital Stay: Payer: Medicare Other

## 2017-02-12 ENCOUNTER — Inpatient Hospital Stay (HOSPITAL_BASED_OUTPATIENT_CLINIC_OR_DEPARTMENT_OTHER): Payer: Medicare Other | Admitting: Hematology

## 2017-02-12 VITALS — BP 155/71 | HR 91 | Temp 97.7°F | Resp 18 | Ht 73.0 in | Wt 208.0 lb

## 2017-02-12 DIAGNOSIS — Z5112 Encounter for antineoplastic immunotherapy: Secondary | ICD-10-CM | POA: Diagnosis not present

## 2017-02-12 DIAGNOSIS — C220 Liver cell carcinoma: Secondary | ICD-10-CM | POA: Diagnosis not present

## 2017-02-12 DIAGNOSIS — D509 Iron deficiency anemia, unspecified: Secondary | ICD-10-CM

## 2017-02-12 DIAGNOSIS — K7031 Alcoholic cirrhosis of liver with ascites: Secondary | ICD-10-CM | POA: Diagnosis not present

## 2017-02-12 DIAGNOSIS — K7469 Other cirrhosis of liver: Secondary | ICD-10-CM

## 2017-02-12 DIAGNOSIS — G893 Neoplasm related pain (acute) (chronic): Secondary | ICD-10-CM

## 2017-02-12 DIAGNOSIS — K740 Hepatic fibrosis: Secondary | ICD-10-CM | POA: Diagnosis not present

## 2017-02-12 DIAGNOSIS — Z801 Family history of malignant neoplasm of trachea, bronchus and lung: Secondary | ICD-10-CM

## 2017-02-12 DIAGNOSIS — Z7901 Long term (current) use of anticoagulants: Secondary | ICD-10-CM

## 2017-02-12 DIAGNOSIS — B182 Chronic viral hepatitis C: Secondary | ICD-10-CM

## 2017-02-12 DIAGNOSIS — Z808 Family history of malignant neoplasm of other organs or systems: Secondary | ICD-10-CM | POA: Diagnosis not present

## 2017-02-12 DIAGNOSIS — K769 Liver disease, unspecified: Secondary | ICD-10-CM

## 2017-02-12 DIAGNOSIS — F17210 Nicotine dependence, cigarettes, uncomplicated: Secondary | ICD-10-CM

## 2017-02-12 DIAGNOSIS — G8929 Other chronic pain: Secondary | ICD-10-CM | POA: Diagnosis not present

## 2017-02-12 DIAGNOSIS — F419 Anxiety disorder, unspecified: Secondary | ICD-10-CM | POA: Diagnosis not present

## 2017-02-12 DIAGNOSIS — Z8619 Personal history of other infectious and parasitic diseases: Secondary | ICD-10-CM

## 2017-02-12 LAB — COMPREHENSIVE METABOLIC PANEL
ALK PHOS: 109 U/L (ref 40–150)
ALT: 15 U/L (ref 0–55)
AST: 29 U/L (ref 5–34)
Albumin: 3.9 g/dL (ref 3.5–5.0)
Anion gap: 10 (ref 3–11)
BILIRUBIN TOTAL: 0.6 mg/dL (ref 0.2–1.2)
BUN: 15 mg/dL (ref 7–26)
CO2: 21 mmol/L — ABNORMAL LOW (ref 22–29)
CREATININE: 1.33 mg/dL — AB (ref 0.70–1.30)
Calcium: 9.3 mg/dL (ref 8.4–10.4)
Chloride: 107 mmol/L (ref 98–109)
GFR calc Af Amer: >60 mL/min (ref >60)
GFR, EST NON AFRICAN AMERICAN: 55 mL/min — AB (ref >60)
Glucose, Bld: 117 mg/dL (ref 70–140)
Potassium: 3.7 mmol/L (ref 3.5–5.1)
Sodium: 138 mmol/L (ref 136–145)
TOTAL PROTEIN: 7.3 g/dL (ref 6.4–8.3)

## 2017-02-12 LAB — CBC WITH DIFFERENTIAL/PLATELET
BASOS ABS: 0 10*3/uL (ref 0.0–0.1)
Basophils Relative: 1 %
Eosinophils Absolute: 0.3 10*3/uL (ref 0.0–0.5)
Eosinophils Relative: 4 %
HEMATOCRIT: 35.2 % — AB (ref 38.4–49.9)
HEMOGLOBIN: 12 g/dL — AB (ref 13.0–17.1)
LYMPHS PCT: 25 %
Lymphs Abs: 1.9 10*3/uL (ref 0.9–3.3)
MCH: 29.4 pg (ref 27.2–33.4)
MCHC: 34 g/dL (ref 32.0–36.0)
MCV: 86.3 fL (ref 79.3–98.0)
MONO ABS: 0.7 10*3/uL (ref 0.1–0.9)
Monocytes Relative: 9 %
NEUTROS ABS: 4.6 10*3/uL (ref 1.5–6.5)
NEUTROS PCT: 61 %
Platelets: 176 10*3/uL (ref 140–400)
RBC: 4.08 MIL/uL — AB (ref 4.20–5.82)
RDW: 13.6 % (ref 11.0–15.6)
WBC: 7.5 10*3/uL (ref 4.0–10.3)

## 2017-02-12 MED ORDER — HEPARIN SOD (PORK) LOCK FLUSH 100 UNIT/ML IV SOLN
500.0000 [IU] | Freq: Once | INTRAVENOUS | Status: AC | PRN
  Administered 2017-02-12: 500 [IU]
  Filled 2017-02-12: qty 5

## 2017-02-12 MED ORDER — SODIUM CHLORIDE 0.9 % IV SOLN
240.0000 mg | Freq: Once | INTRAVENOUS | Status: AC
  Administered 2017-02-12: 240 mg via INTRAVENOUS
  Filled 2017-02-12: qty 24

## 2017-02-12 MED ORDER — SODIUM CHLORIDE 0.9 % IV SOLN
Freq: Once | INTRAVENOUS | Status: AC
  Administered 2017-02-12: 15:00:00 via INTRAVENOUS

## 2017-02-12 MED ORDER — SODIUM CHLORIDE 0.9% FLUSH
10.0000 mL | INTRAVENOUS | Status: DC | PRN
  Administered 2017-02-12: 10 mL
  Filled 2017-02-12: qty 10

## 2017-02-12 MED ORDER — OXYCODONE HCL 20 MG PO TABS: 20.0000 mg | ORAL_TABLET | ORAL | 0 refills | Status: DC | PRN

## 2017-02-12 MED FILL — oxyCODONE HCL 20 MG TABS: 20 | 20 days supply | Qty: 120 | Fill #0

## 2017-02-12 NOTE — Patient Instructions (Signed)

## 2017-02-12 NOTE — Patient Instructions (Signed)
[facility] Discharge Instructions for Patients Receiving Chemotherapy  Today you received the following chemotherapy agents :  Opdivo.  To help prevent nausea and vomiting after your treatment, we encourage you to take your nausea medication as prescribed.   If you develop nausea and vomiting that is not controlled by your nausea medication, call the clinic.   BELOW ARE SYMPTOMS THAT SHOULD BE REPORTED IMMEDIATELY:  *FEVER GREATER THAN 100.5 F  *CHILLS WITH OR WITHOUT FEVER  NAUSEA AND VOMITING THAT IS NOT CONTROLLED WITH YOUR NAUSEA MEDICATION  *UNUSUAL SHORTNESS OF BREATH  *UNUSUAL BRUISING OR BLEEDING  TENDERNESS IN MOUTH AND THROAT WITH OR WITHOUT PRESENCE OF ULCERS  *URINARY PROBLEMS  *BOWEL PROBLEMS  UNUSUAL RASH Items with * indicate a potential emergency and should be followed up as soon as possible.  Feel free to call the clinic should you have any questions or concerns. The clinic phone number is [phone].  Please show the CHEMO ALERT CARD at check-in to the Emergency Department and triage nurse.   

## 2017-02-25 ENCOUNTER — Telehealth: Payer: Self-pay | Admitting: *Deleted

## 2017-02-25 NOTE — Telephone Encounter (Signed)
Received call from wife [name] requesting refills of  Xanax and Oxycontin 80 mg .  Stated pt will pick up scripts when he comes in for chemo on Friday  02/26/17.

## 2017-02-25 NOTE — Telephone Encounter (Signed)
I will refill, thanks Lacie

## 2017-02-26 ENCOUNTER — Inpatient Hospital Stay: Payer: Medicare Other | Attending: Hematology

## 2017-02-26 ENCOUNTER — Other Ambulatory Visit: Payer: Medicare Other

## 2017-02-26 ENCOUNTER — Other Ambulatory Visit: Payer: Self-pay | Admitting: Medical Oncology

## 2017-02-26 ENCOUNTER — Inpatient Hospital Stay: Payer: Medicare Other

## 2017-02-26 VITALS — BP 158/68 | HR 87 | Temp 98.0°F | Resp 18

## 2017-02-26 DIAGNOSIS — C220 Liver cell carcinoma: Secondary | ICD-10-CM | POA: Diagnosis not present

## 2017-02-26 DIAGNOSIS — F419 Anxiety disorder, unspecified: Secondary | ICD-10-CM | POA: Diagnosis not present

## 2017-02-26 DIAGNOSIS — I10 Essential (primary) hypertension: Secondary | ICD-10-CM | POA: Insufficient documentation

## 2017-02-26 DIAGNOSIS — D6481 Anemia due to antineoplastic chemotherapy: Secondary | ICD-10-CM

## 2017-02-26 DIAGNOSIS — G893 Neoplasm related pain (acute) (chronic): Secondary | ICD-10-CM | POA: Diagnosis not present

## 2017-02-26 DIAGNOSIS — Z7901 Long term (current) use of anticoagulants: Secondary | ICD-10-CM | POA: Insufficient documentation

## 2017-02-26 DIAGNOSIS — I81 Portal vein thrombosis: Secondary | ICD-10-CM | POA: Insufficient documentation

## 2017-02-26 DIAGNOSIS — Z79899 Other long term (current) drug therapy: Secondary | ICD-10-CM | POA: Insufficient documentation

## 2017-02-26 DIAGNOSIS — M545 Low back pain: Secondary | ICD-10-CM | POA: Diagnosis not present

## 2017-02-26 DIAGNOSIS — Z66 Do not resuscitate: Secondary | ICD-10-CM | POA: Diagnosis not present

## 2017-02-26 DIAGNOSIS — K7031 Alcoholic cirrhosis of liver with ascites: Secondary | ICD-10-CM | POA: Insufficient documentation

## 2017-02-26 DIAGNOSIS — Z5112 Encounter for antineoplastic immunotherapy: Secondary | ICD-10-CM | POA: Diagnosis not present

## 2017-02-26 DIAGNOSIS — T451X5A Adverse effect of antineoplastic and immunosuppressive drugs, initial encounter: Secondary | ICD-10-CM

## 2017-02-26 DIAGNOSIS — G9340 Encephalopathy, unspecified: Secondary | ICD-10-CM | POA: Insufficient documentation

## 2017-02-26 DIAGNOSIS — F17210 Nicotine dependence, cigarettes, uncomplicated: Secondary | ICD-10-CM | POA: Diagnosis not present

## 2017-02-26 DIAGNOSIS — G4700 Insomnia, unspecified: Secondary | ICD-10-CM | POA: Insufficient documentation

## 2017-02-26 LAB — COMPREHENSIVE METABOLIC PANEL
ALT: 15 U/L (ref 0–55)
AST: 25 U/L (ref 5–34)
Albumin: 3.7 g/dL (ref 3.5–5.0)
Alkaline Phosphatase: 90 U/L (ref 40–150)
Anion gap: 9 (ref 3–11)
BUN: 8 mg/dL (ref 7–26)
CHLORIDE: 108 mmol/L (ref 98–109)
CO2: 22 mmol/L (ref 22–29)
CREATININE: 1.17 mg/dL (ref 0.70–1.30)
Calcium: 8.7 mg/dL (ref 8.4–10.4)
GFR calc non Af Amer: >60 mL/min (ref >60)
Glucose, Bld: 141 mg/dL — ABNORMAL HIGH (ref 70–140)
POTASSIUM: 3.7 mmol/L (ref 3.5–5.1)
SODIUM: 139 mmol/L (ref 136–145)
Total Bilirubin: 0.3 mg/dL (ref 0.2–1.2)
Total Protein: 6.9 g/dL (ref 6.4–8.3)

## 2017-02-26 LAB — CBC WITH DIFFERENTIAL/PLATELET
Basophils Absolute: 0.1 10*3/uL (ref 0.0–0.1)
Basophils Relative: 1 %
Eosinophils Absolute: 0.2 10*3/uL (ref 0.0–0.5)
Eosinophils Relative: 3 %
HEMATOCRIT: 33.5 % — AB (ref 38.4–49.9)
Hemoglobin: 11.5 g/dL — ABNORMAL LOW (ref 13.0–17.1)
LYMPHS ABS: 2.5 10*3/uL (ref 0.9–3.3)
LYMPHS PCT: 32 %
MCH: 29.7 pg (ref 27.2–33.4)
MCHC: 34.3 g/dL (ref 32.0–36.0)
MCV: 86.5 fL (ref 79.3–98.0)
MONOS PCT: 10 %
Monocytes Absolute: 0.8 10*3/uL (ref 0.1–0.9)
NEUTROS ABS: 4.2 10*3/uL (ref 1.5–6.5)
Neutrophils Relative %: 54 %
Platelets: 165 10*3/uL (ref 140–400)
RBC: 3.88 MIL/uL — AB (ref 4.20–5.82)
RDW: 13.9 % (ref 11.0–14.6)
WBC: 7.8 10*3/uL (ref 4.0–10.3)

## 2017-02-26 MED ORDER — HEPARIN SOD (PORK) LOCK FLUSH 100 UNIT/ML IV SOLN
500.0000 [IU] | Freq: Once | INTRAVENOUS | Status: AC | PRN
  Administered 2017-02-26: 500 [IU]
  Filled 2017-02-26: qty 5

## 2017-02-26 MED ORDER — OXYCODONE HCL ER 80 MG PO T12A: 160.0000 mg | EXTENDED_RELEASE_TABLET | Freq: Two times a day (BID) | ORAL | 0 refills | Status: DC

## 2017-02-26 MED ORDER — SODIUM CHLORIDE 0.9 % IV SOLN
Freq: Once | INTRAVENOUS | Status: AC
  Administered 2017-02-26: 10:00:00 via INTRAVENOUS

## 2017-02-26 MED ORDER — SODIUM CHLORIDE 0.9 % IV SOLN
240.0000 mg | Freq: Once | INTRAVENOUS | Status: AC
  Administered 2017-02-26: 240 mg via INTRAVENOUS
  Filled 2017-02-26: qty 24

## 2017-02-26 MED ORDER — SODIUM CHLORIDE 0.9% FLUSH
10.0000 mL | INTRAVENOUS | Status: DC | PRN
  Administered 2017-02-26: 10 mL
  Filled 2017-02-26: qty 10

## 2017-02-26 MED ORDER — ALPRAZOLAM 1 MG PO TABS: 1.0000 mg | ORAL_TABLET | Freq: Four times a day (QID) | ORAL | 0 refills | Status: DC | PRN

## 2017-02-26 MED FILL — OxyCONTIN 80 MG T12A: 80 | 30 days supply | Qty: 120 | Fill #0

## 2017-02-26 MED FILL — ALPRAZolam 1 MG TABS: 1 | 30 days supply | Qty: 120 | Fill #0

## 2017-02-26 NOTE — Patient Instructions (Signed)
[facility] Discharge Instructions for Patients Receiving Chemotherapy  Today you received the following chemotherapy agents: Nivolumab (Opdivo)  To help prevent nausea and vomiting after your treatment, we encourage you to take your nausea medication as prescribed.    If you develop nausea and vomiting that is not controlled by your nausea medication, call the clinic.   BELOW ARE SYMPTOMS THAT SHOULD BE REPORTED IMMEDIATELY:  *FEVER GREATER THAN 100.5 F  *CHILLS WITH OR WITHOUT FEVER  NAUSEA AND VOMITING THAT IS NOT CONTROLLED WITH YOUR NAUSEA MEDICATION  *UNUSUAL SHORTNESS OF BREATH  *UNUSUAL BRUISING OR BLEEDING  TENDERNESS IN MOUTH AND THROAT WITH OR WITHOUT PRESENCE OF ULCERS  *URINARY PROBLEMS  *BOWEL PROBLEMS  UNUSUAL RASH Items with * indicate a potential emergency and should be followed up as soon as possible.  Feel free to call the clinic should you have any questions or concerns. The clinic phone number is [phone].  Please show the CHEMO ALERT CARD at check-in to the Emergency Department and triage nurse.   

## 2017-02-27 LAB — AFP TUMOR MARKER: AFP, Serum, Tumor Marker: 5.5 ng/mL (ref 0.0–8.3)

## 2017-03-03 ENCOUNTER — Other Ambulatory Visit: Payer: Self-pay | Admitting: Hematology

## 2017-03-03 ENCOUNTER — Other Ambulatory Visit: Payer: Self-pay | Admitting: *Deleted

## 2017-03-03 ENCOUNTER — Telehealth: Payer: Self-pay | Admitting: *Deleted

## 2017-03-03 MED ORDER — OXYCODONE HCL 20 MG PO TABS: 20.0000 mg | ORAL_TABLET | ORAL | 0 refills | Status: DC | PRN

## 2017-03-03 NOTE — Telephone Encounter (Signed)
I have refilled electronically.   Truitt Merle MD

## 2017-03-03 NOTE — Telephone Encounter (Signed)
Pt called requesting refill of Oxycodone 20 mg. Pt's    Phone     [phone].

## 2017-03-04 MED FILL — oxyCODONE HCL 20 MG TABS: 20 | 20 days supply | Qty: 120 | Fill #0

## 2017-03-08 ENCOUNTER — Ambulatory Visit (HOSPITAL_COMMUNITY)
Admission: RE | Admit: 2017-03-08 | Discharge: 2017-03-08 | Disposition: A | Discharge status: Home / Self Care | Payer: Medicare Other | Source: Ambulatory Visit | Attending: Hematology | Admitting: Hematology

## 2017-03-08 DIAGNOSIS — K8020 Calculus of gallbladder without cholecystitis without obstruction: Secondary | ICD-10-CM | POA: Diagnosis not present

## 2017-03-08 DIAGNOSIS — K766 Portal hypertension: Secondary | ICD-10-CM | POA: Diagnosis not present

## 2017-03-08 DIAGNOSIS — K769 Liver disease, unspecified: Secondary | ICD-10-CM | POA: Diagnosis not present

## 2017-03-08 DIAGNOSIS — K7460 Unspecified cirrhosis of liver: Secondary | ICD-10-CM | POA: Diagnosis not present

## 2017-03-08 MED ORDER — GADOBENATE DIMEGLUMINE 529 MG/ML IV SOLN
20.0000 mL | Freq: Once | INTRAVENOUS | Status: AC | PRN
  Administered 2017-03-08: 20 mL via INTRAVENOUS

## 2017-03-08 MED FILL — XARELTO 20 MG TABLET: 20 | 30 days supply | Qty: 30 | Fill #2

## 2017-03-10 NOTE — Progress Notes (Signed)
[facility]  [phone] Fax:([phone]  Clinic Follow up Note   Patient Care Team: Antonietta Jewel, MD as PCP - General (Internal Medicine) [name], Cristopher Estimable, MD as Attending Physician (Gastroenterology) Antonietta Jewel, MD as Referring Physician (Internal Medicine) Truitt Merle, MD as Consulting Physician (Hematology) [name], CRNP as Nurse Practitioner (Nurse Practitioner)   Date of Service:  03/12/2017   CHIEF COMPLAINTS:  Follow up recurrent [place]   Oncology History   Hepatocellular carcinoma [facility])   Staging form: Liver (Excluding Intrahepatic Bile Ducts), AJCC 7th Edition   - Clinical stage from 04/16/2012: Stage II (T2(m), N0, M0) - Signed by Truitt Merle, MD on 10/12/2015   - Pathologic stage from 04/16/2013: Stage II (T2, N0, cM0) - Signed by Truitt Merle, MD on 10/12/2015      Hepatocellular carcinoma ([place])   09/14/2012 Tumor Marker    AFP 28.9      04/16/2013 Imaging    Abdominal MRI with and without contrast reviewed multifocal (4) liver lesions in both left and right lobes, most consistent with HCC, largest measuring 2.7 cm      04/16/2013 Initial Diagnosis    Hepatocellular carcinoma ([place])      04/28/2013 Procedure    Right TACE with lipiodol       06/15/2013 Procedure    Left TACE with [facility]       07/14/2013 Procedure    Right TACE with [facility]      09/05/2015 Imaging    CT abdomen with and without contrast showed a new 5.0 x 2.3 cm mass in the hepatic segment 8, invading and occluding the right anterior portal vein, most compatible with [place]. A portacaval node measuring 3.0 x 1.4 cm, previously 2.9 x 1.1 cm.      10/11/2015 Tumor Marker    AFP 5.3      11/11/2015 - 02/03/2016 Chemotherapy    sorafenib 200mg  bid started on 11/11/2015, increased to 400mg  bid in 2 weeks, stopped due to poor tolerance and probable disease progression       11/14/2015 Imaging    CT CAP 11/14/2015 IMPRESSION: Chest Impression: 1. RIGHT upper lobe pulmonary  nodule is indeterminate. No comparison available. 2. Ground-glass opacity and peripheral nodules in the RIGHT middle lobe appear post infectious or inflammatory.  Abdomen / Pelvis Impression: 1. 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 Thrombus also likely within the LEFT and RIGHT portal vein. Difficult to distinguish tumor thrombus versus bland thrombus. 2. Individual lesions are difficult to define in the RIGHT hepatic lobe. There is overall impression of progression of disease in the RIGHT hepatic lobe with multiple ill-defined enhancing lesions. 3. Periportal and shotty retroperitoneal adenopathy similar to prior.      01/25/2016 Imaging    CT Abdomen Pelvis w/ Contrast IMPRESSION: 1. Findings consistent with multifocal liver carcinoma with extensive portal vein thrombosis, and subsequent development of porta hepatis venous collaterals. Thrombus in the central portal vein is no longer expansile, but still expands the more peripheral portal vein and the right and left portal veins. There are few right liver bile ducts there are now dilated, which suggests mild progression of liver disease. 2. There is mild splenomegaly consistent or venous hypertension, which has mildly increased from the prior CT. 3. There are new lung abnormalities with interstitial thickening and ill-defined peribronchovascular nodular opacities mostly at the right lung base with a smaller area noted along  the medial left lower lobe. The etiology may be infectious. It may be inflammatory, possibly related to chemotherapy. Neoplastic disease is also possible. 4. There is no other evidence of metastatic disease within the abdomen or pelvis. No acute findings within the abdomen or pelvis      01/25/2016 - 01/25/2016 Hospital Admission    Pt was seen in ED for worsening abdominal pain, treated with pain meds        02/04/2016 -  Chemotherapy    The patient started on immunotherapy treatment nivolumab every 2 weeks; on 04/30/16 dose was reduced to 420mg  every 3 weeks       02/14/2016 Imaging    CT CAP w/ contrast 1. Slight interval increase in size of the mediastinal lymph nodes. 2. Persistent extensive lower lobe peribronchial thickening and patchy infiltrates. Possible aspiration pneumonia. 3. Persistent tree-in-bud appearance in the right middle lobe, likely chronic inflammation or atypical infection. 4. Improved CT appearance of the liver. Two small residual arterial phase enhancing lesions are identified. 5. Chronic portal vein thrombosis with cavernous transformation of portal venous collaterals. Esophageal varices are again noted. 6. Stable upper abdominal lymph nodes likely related to cirrhosis.       04/16/2016 Tumor Marker    AFP 4.1       04/17/2016 Imaging     CT CAP W PELVIS IMPRESSION: 1. Stable mild mediastinal lymphadenopathy. 2. Interval improvement in the bibasilar peribronchial thickening, airway impaction, and peribronchovascular nodularity. Imaging features suggest marked improvement in bilateral low the atypical infection. Changes in the right middle lobe with more stable and may represent scarring from previous infectious/inflammatory etiology. 3. The small hypervascular lesions seen in the anterior left liver on the previous study are not evident today. There is some subtle, ill-defined hyperenhancement in the central left liver which is nonspecific. Attention to this area on followup imaging recommended. 4. Chronic portal vein inclusion with cavernous transformation in the porta hepatis and paraesophageal varices. 5. Stable appearance hepatoduodenal and retroperitoneal lymphadenopathy, likely chronic and related to liver disease.      04/17/2016 Imaging    CT CAP CONTRAST IMPRESSION: 1. Stable mild mediastinal lymphadenopathy. 2. Interval improvement in the bibasilar  peribronchial thickening, airway impaction, and peribronchovascular nodularity. Imaging features suggest marked improvement in bilateral low the atypical infection. Changes in the right middle lobe with more stable and may represent scarring from previous infectious/inflammatory etiology. 3. The small hypervascular lesions seen in the anterior left liver on the previous study are not evident today. There is some subtle, ill-defined hyperenhancement in the central left liver which is nonspecific. Attention to this area on followup imaging recommended. 4. Chronic portal vein inclusion with cavernous transformation in the porta hepatis and paraesophageal varices. 5. Stable appearance hepatoduodenal and retroperitoneal lymphadenopathy, likely chronic and related to liver disease.      05/21/2016 Imaging    MR Abdomen W WO Contrast IMPRESSION: 1. Limited motion degraded scan. Cirrhosis. No evidence of a liver mass within these limitations. 2. Mild splenomegaly. No ascites. Stable mild paraumbilical and gastroesophageal varices. 3. Stable nonspecific mild retroperitoneal lymphadenopathy. 4. 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      06/04/2016 Tumor Marker    AFP 3.5       08/25/2016 Imaging    MR Abdomen w wo contrast  IMPRESSION: 1. Mild-to-moderate motion degradation, preferentially involving the pre and postcontrast dynamic images. On follow-up, given the extent of motion on this exam, multiphase CT may be preferred.  2. Given these limitations, no evidence of hepatocellular carcinoma. 3. Marked cirrhosis and portal venous hypertension. 4. Slight increase in abdominal adenopathy, which is most likely reactive. Recommend attention on follow-up. 5. Cholelithiasis.       11/02/2016 Procedure    Upper endoscopy and colonoscopy  Diagnosis 1. Stomach, biopsy - MILD CHRONIC GASTRITIS. - NEGATIVE FOR HELICOBACTER PYLORI. - NO  INTESTINAL METAPLASIA, DYSPLASIA, OR MALIGNANCY. 2. Colon, polyp(s), opposite ileocecal valve - TUBULAR ADENOMA. - NO HIGH GRADE DYSPLASIA OR MALIGNANCY. 3. Rectum, polyp(s) - HYPERPLASTIC POLYP. - NO DYSPLASIA OR MALIGNANCY.      01/08/2017 Imaging    CT Chest WO Contrast 01/08/17  IMPRESSION: 1. Stable chest CT. 2. Prominent to mildly enlarged mediastinal lymph nodes are stable. Based on stability, these are favored to be reactive. 3. Stable emphysema and right upper lobe nodularity. 4. Mild atherosclerosis.      01/08/2017 Imaging    MRI Abd W WO Contrast 01/08/17  IMPRESSION: 1. Cirrhotic liver now with severe iron deposition throughout the hepatic parenchyma, as well as areas of developing confluent hepatic fibrosis. Today's study clearly demonstrates 2 new hypervascular nodules in segment 4A of the liver, which demonstrate washout but no definite pseudocapsule, the largest which measures 1.5 cm. At this time, these are considered suspicious for hepatocellular carcinoma, bladder classified as Li-RADS 3 lesions. 2. 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 3. Cholelithiasis without evidence of acute cholecystitis at this time. 4. Mild splenomegaly.      03/08/2017 Imaging    MRI Abdomen W WO Contrast 03/08/17 IMPRESSION: 1. 15 mm hypervascular lesion identified on the previous study is stable in the interval measuring 14 mm today. A lesion of this size demonstrating arterial phase hypervascularity and washout is consistent with LI-RADS 4, suggestive of but not diagnostic for hepatoma. Follow-up MRI in 3 months may be warranted. 2. 9 mm hypervascular lesion identified in segment IV on the previous study is not identified today. Attention on follow-up is recommended. 3. 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 4. Cholelithiasis.        HISTORY OF PRESENTING ILLNESS (10/11/2015):  Kevin Dillon 67 y.o. male with past medical history of treated hepatitis C, liver cirrhosis, and hepatocellular carcinoma is here because of recurrent hepatocellular carcinoma. He is accompanied by his significant other [name] and her sister.  He was diagnosed with hepatocellular carcinoma in 2015, his initial image result is not available and staging is unknown. He was seen by interventional radiologist Dr. Delice Lesch at [facility] in [place] and underwent TACE procedure 3 times in 2015. He was subsequently followed, and recently repeated CT scan showed a new 5.0 cm mass in segment 8, with portal vein invasion. He was felt not to be a candidate for further liver targeted therapy, and was referred to [place] for further systemic therapy.  He complains about mid epigastric pain for the past 2 years, which significantly improved after his TACE procedure in 2015. It has been getting worse lately in the past 6 months. He states he gets about 7-8 out of 10, persistent, he has been taking oxycodone 20 mg every 6 hours, but his pain is not well controlled. He is quite fatigued, is low appetite. He last 40 lbs in the pat 4 months. He is able to function at home, in trying to remain to be physically active.  His hepatitis C was successfully treated. He  has history of hepatitis C, complicated with ascites and encephalopathy, but it has been well controlled with medical management.   CURRENT THERAPY:  Nivolumab 240mg  every 2 weeks, started on 02/04/2016; on 04/30/16 switched to every 3 weeks due to fatigue; change from 240mg  to 480mg  every 4 weeks starting 09/03/2016. Given his poor toleration, will reduce him to every 2 weeks at 240mg  starting 01/02/17   INTERIM HISTORY:   Kevin Dillon returns for follow up. He is accompanied by his wife today. He notes he is quite fatigued for most of the day. He reports he is able to function. He does yard work but then he feels  tired and has to sit down and rest. He reports he has back pain, which he has had for quite some time. He notes that his abdominal pain that he had at the start of treatment is much improved. He states he takes 4-6 20 mg oxycodone per day.   On review of systems, pt denies fever, abnormal bleeding or any other complaints at this time. Pertinent positives are listed and detailed within the above HPI.   MEDICAL HISTORY:  Past Medical History:  Diagnosis Date  . Cancer ([place])    liver  . Cholelithiasis   . Chronic hepatitis C ([place])   . Chronic knee pain   . Chronic left shoulder pain   . CVA (cerebral infarction)   . Gout   . Hepatitis C    genotype 1b.  pt has been vaccinated against hep A and B.  . Hypertension   . Osteoarthritis   . Schatzki's ring   . Tubular adenoma of colon 12/2011    SURGICAL HISTORY: Past Surgical History:  Procedure Laterality Date  . AMPUTATION Left 09/17/2012   Procedure: SMALL FINGER EXTENSOR TENDON REPAIR; METACARPAL LEVEL AMPUTATION RING FINGER; PROXIMAL PHALANX LEVEL AMPUTATION LONG FINGER;  Surgeon: Schuyler Amor, MD;  Location: [facility];  Service: Orthopedics;  Laterality: Left;  . Arm surgery     right/plate in arm  . BIOPSY  11/02/2016   Procedure: BIOPSY;  Surgeon: Daneil Dolin, MD;  [facility];  Service: Endoscopy;;  gastric   . COLONOSCOPY WITH ESOPHAGOGASTRODUODENOSCOPY (EGD)  12/30/2011   RMR: Noncritical Schatzki's ring;  Hiatal hernia, Tubular ADENOMA removed from splenic flexure, otherwise normal colonoscopy  . COLONOSCOPY WITH PROPOFOL N/A 11/02/2016   Procedure: COLONOSCOPY WITH PROPOFOL;  Surgeon: Daneil Dolin, MD;  [facility];  Service: Endoscopy;  Laterality: N/A;  1045  . ESOPHAGOGASTRODUODENOSCOPY (EGD) WITH PROPOFOL N/A 11/02/2016   Procedure: ESOPHAGOGASTRODUODENOSCOPY (EGD) WITH PROPOFOL;  Surgeon: Daneil Dolin, MD;  [facility];  Service: Endoscopy;  Laterality: N/A;  . IR FLUORO  GUIDE PORT INSERTION RIGHT  10/21/2016  . IR US GUIDE VASC ACCESS RIGHT  10/21/2016  . LEG SURGERY     left  . POLYPECTOMY  11/02/2016   Procedure: POLYPECTOMY;  Surgeon: Daneil Dolin, MD;  [facility];  Service: Endoscopy;;  colon  . WOUND EXPLORATION Left 09/17/2012   Procedure: WOUND EXPLORATION;  Surgeon: Schuyler Amor, MD;  Location: [facility];  Service: Orthopedics;  Laterality: Left;    SOCIAL HISTORY: Social History   Socioeconomic History  . Marital status: Significant Other    Spouse name: Not on file  . Number of children: 0  . Years of education: Not on file  . Highest education level: Not on file  Social Needs  . Financial resource strain: Not  on file  . Food insecurity - worry: Not on file  . Food insecurity - inability: Not on file  . Transportation needs - medical: Not on file  . Transportation needs - non-medical: Not on file  Occupational History  . Occupation: disabled  Tobacco Use  . Smoking status: Former Smoker    Packs/day: 0.50    Years: 40.00    Pack years: 20.00    Types: Cigarettes    Last attempt to quit: 01/19/2012    Years since quitting: 5.1  . Smokeless tobacco: Never Used  . Tobacco comment: Smokes 1/2 pack of cigarettes daily  Substance and Sexual Activity  . Alcohol use: No    Comment: HX 2 beers 3-4 days per week; QUIT DEC 2013  . Drug use: No    Comment: Hx cocaine yrs ago, Last marijuana OCT 2013  . Sexual activity: Not Currently    Partners: Female  Other Topics Concern  . Not on file  Social History Narrative   Lives w/ significant other, [name]    FAMILY HISTORY: Family History  Problem Relation Age of Onset  . Cirrhosis Father 104  . Cancer Father        liver cancer   . Lung cancer Mother 64  . Colon cancer Neg Hx     ALLERGIES:  has No Known Allergies.  MEDICATIONS:  Current Outpatient Medications  Medication Sig Dispense Refill  . ALPRAZolam (XANAX) 1 MG tablet Take 1 tablet (1 mg total)  by mouth every 6 (six) hours as needed for anxiety. 120 tablet 0  . cyclobenzaprine (FLEXERIL) 5 MG tablet Take 1 tablet (5 mg total) by mouth 3 (three) times daily as needed for muscle spasms. 30 tablet 0  . hydrALAZINE (APRESOLINE) 25 MG tablet Take 75 mg by mouth daily.     Marland Kitchen lactulose (CHRONULAC) 10 GM/15ML solution Take 30 g by mouth daily as needed for moderate constipation.     . lidocaine-prilocaine (EMLA) cream Apply 1 application topically as needed. Apply to portacath 1 1/2 hours - 2 hours prior to procedures as needed. 30 g 1  . megestrol (MEGACE) 40 MG/ML suspension Take 400 mg by mouth daily.     . Multiple Vitamins-Minerals (CENTRUM SILVER PO) Take 1 tablet by mouth daily.     Marland Kitchen oxyCODONE (OXYCONTIN) 80 mg 12 hr tablet Take 2 tablets (160 mg total) by mouth every 12 (twelve) hours. 120 tablet 0  . Oxycodone HCl 20 MG TABS Take 1 tablet (20 mg total) by mouth every 4 (four) hours as needed. 120 tablet 0  . prochlorperazine (COMPAZINE) 10 MG tablet TAKE 1 TABLET BY MOUTH EVERY 8 HOURS AS NEEDED FOR NAUSEA/VOMITING (Patient taking differently: No sig reported) 30 tablet 1  . rivaroxaban (XARELTO) 20 MG TABS tablet TAKE 1 TABLET BY MOUTH DAILY WITH SUPPER. 30 tablet 3   No current facility-administered medications for this visit.     REVIEW OF SYSTEMS:  Constitutional: Denies fevers, chills or abnormal night sweats  (+) weight gain (+) fatigue Eyes: Denies blurriness of vision, double vision or watery eyes Ears, nose, mouth, throat, and face: Denies mucositis or sore throat Respiratory: Denies cough, dyspnea or wheezes Cardiovascular: Denies palpitation, chest discomfort or lower extremity swelling Gastrointestinal:  Denies nausea, heartburn or change in bowel habits,  Skin: Denies abnormal skin rashes Lymphatics: Denies new lymphadenopathy or easy bruising Neurological:Denies numbness, tingling or new weaknesses Behavioral/Psych: normal Musculoskeletal: (+) back pain (+) lower  extremity myalgia pain (+) pain  controlled  All other systems were reviewed with the patient and are negative.  PHYSICAL EXAMINATION  ECOG PERFORMANCE STATUS: 2  Vitals:   03/12/17 0835  BP: (!) 168/69  Pulse: 84  Resp: (!) 21  Temp: 98.2 F (36.8 C)  SpO2: 99%   Filed Weights   03/12/17 0835  Weight: 210 lb 14.4 oz (95.7 kg)     GENERAL:alert, no distress and comfortable SKIN: skin color, texture, turgor are normal, no rashes or significant lesions EYES: normal, conjunctiva are pink and non-injected, sclera clear OROPHARYNX:no exudate, no erythema and lips, buccal mucosa, and tongue normal  NECK: supple, thyroid normal size, non-tender, without nodularity LYMPH:  no palpable lymphadenopathy in the cervical, axillary or inguinal LUNGS: clear to auscultation and percussion with normal breathing effort HEART: regular rate & rhythm and no murmurs and no lower extremity edema ABDOMEN:(+) abdominal bloating, no tenderness Musculoskeletal:no cyanosis of digits and no clubbing  PSYCH: alert & oriented x 3 with fluent speech NEURO: no focal motor/sensory deficits  LABORATORY DATA:  I have reviewed the data as listed CBC Latest Ref Rng & Units 03/12/2017 02/26/2017 02/12/2017  WBC 4.0 - 10.3 K/uL 6.6 7.8 7.5  Hemoglobin 13.0 - 17.1 g/dL - 11.5(L) 12.0(L)  Hematocrit 38.4 - 49.9 % 36.6(L) 33.5(L) 35.2(L)  Platelets 140 - 400 K/uL 165 165 176   CMP Latest Ref Rng & Units 03/12/2017 02/26/2017 02/12/2017  Glucose 70 - 140 mg/dL 121 141(H) 117  BUN 7 - 26 mg/dL 11 8 15   Creatinine 0.70 - 1.30 mg/dL 1.32(H) 1.17 1.33(H)  Sodium 136 - 145 mmol/L 138 139 138  Potassium 3.5 - 5.1 mmol/L 3.8 3.7 3.7  Chloride 98 - 109 mmol/L 107 108 107  CO2 22 - 29 mmol/L 21(L) 22 21(L)  Calcium 8.4 - 10.4 mg/dL 9.6 8.7 9.3  Total Protein 6.4 - 8.3 g/dL 7.5 6.9 7.3  Total Bilirubin 0.2 - 1.2 mg/dL 0.4 0.3 0.6  Alkaline Phos 40 - 150 U/L 114 90 109  AST 5 - 34 U/L 25 25 29   ALT 0 - 55 U/L 19 15 15      AFP:  09/14/2012: 28.9 10/11/2015: 5.3 11/01/2015: 3.7 12/02/2015: 3.7 01/22/2016: 3.4 02/18/2016: 7.1 03/17/16: 5.8 04/16/16: 4.1 06/04/2016: 3.5 07/16/2016:4.4 08/27/2016: 5.6 10/08/16: 4.3 11/05/16: 4.8 12/03/16: 5.4 01/01/17: 5.1 01/29/17: 4.5 02/26/17: 5.5  PATHOLOGY   Diagnosis 11/02/16  1. Stomach, biopsy - MILD CHRONIC GASTRITIS. - NEGATIVE FOR HELICOBACTER PYLORI. - NO INTESTINAL METAPLASIA, DYSPLASIA, OR MALIGNANCY. 2. Colon, polyp(s), opposite ileocecal valve - TUBULAR ADENOMA. - NO HIGH GRADE DYSPLASIA OR MALIGNANCY. 3. Rectum, polyp(s) - HYPERPLASTIC POLYP. - NO DYSPLASIA OR MALIGNANCY. Microscopic Comment 1. A Warthin-Starry stain is performed to determine the possibility of the presence of Helicobacter pylori. The Warthin-Starry stain is negative for organisms of Helicobacter pylori.   PROCEDURE  Upper Endoscopy by Dr. [name] 11/02/16  IMPRESSION:  - mildly obstructing Schatzki ring. - dilated with scope passage. erosive reflux esophagitis - Small hiatal hernia. - Portal hypertensive gastropathy. biopsied - Normal duodenal bulb and second portion of the duodenum. Biopsied.   Colonoscopy by Dr. [name] 11/02/16  IMPRESSION:  - Two 3 to 5 mm polyps in the rectum and at the ileocecal valve, removed with a cold snare. Resected and retrieved. - Diverticulosis in the sigmoid colon and in the descending colon. - The examination was otherwise normal on direct and retroflexion views.   RADIOGRAPHIC STUDIES: I have personally reviewed the radiological images as listed and agreed with the findings  in the report.  04/17/16 CT CAP W PELVIS IMPRESSION: 1. Stable mild mediastinal lymphadenopathy. 2. Interval improvement in the bibasilar peribronchial thickening, airway impaction, and peribronchovascular nodularity. Imaging features suggest marked improvement in bilateral low the atypical infection. Changes in the right middle lobe with more stable and may represent  scarring from previous infectious/inflammatory etiology. 3. The small hypervascular lesions seen in the anterior left liver on the previous study are not evident today. There is some subtle, ill-defined hyperenhancement in the central left liver which is nonspecific. Attention to this area on followup imaging recommended. 4. Chronic portal vein inclusion with cavernous transformation in the porta hepatis and paraesophageal varices. 5. Stable appearance hepatoduodenal and retroperitoneal lymphadenopathy, likely chronic and related to liver disease.   MR Abdomen W WO Contrast 05/21/16 IMPRESSION: 1. Limited motion degraded scan. Cirrhosis. No evidence of a liver mass within these limitations. 2. Mild splenomegaly. No ascites. Stable mild paraumbilical and gastroesophageal varices. 3. Stable nonspecific mild retroperitoneal lymphadenopathy. 4. 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  MR Abdomen w wo contrast 08/25/2016 IMPRESSION: 1. Mild-to-moderate motion degradation, preferentially involving the pre and postcontrast dynamic images. On follow-up, given the extent of motion on this exam, multiphase CT may be preferred. 2. Given these limitations, no evidence of hepatocellular carcinoma. 3. Marked cirrhosis and portal venous hypertension. 4. Slight increase in abdominal adenopathy, which is most likely reactive. Recommend attention on follow-up. 5. Cholelithiasis.   CT Chest WO Contrast 01/08/17  IMPRESSION: 1. Stable chest CT. 2. Prominent to mildly enlarged mediastinal lymph nodes are stable. Based on stability, these are favored to be reactive. 3. Stable emphysema and right upper lobe nodularity. 4. Mild atherosclerosis.    MRI Abd W WO Contrast 01/08/17  IMPRESSION: 1. Cirrhotic liver now with severe iron deposition throughout the hepatic parenchyma, as well as areas of developing confluent hepatic fibrosis. Today's  study clearly demonstrates 2 new hypervascular nodules in segment 4A of the liver, which demonstrate washout but no definite pseudocapsule, the largest which measures 1.5 cm. At this time, these are considered suspicious for hepatocellular carcinoma, bladder classified as Li-RADS 3 lesions. 2. 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 3. Cholelithiasis without evidence of acute cholecystitis at this time. 4. Mild splenomegaly.  MRI Abdomen W WO Contrast 03/08/17 IMPRESSION: 1. 15 mm hypervascular lesion identified on the previous study is stable in the interval measuring 14 mm today. A lesion of this size demonstrating arterial phase hypervascularity and washout is consistent with LI-RADS 4, suggestive of but not diagnostic for hepatoma. Follow-up MRI in 3 months may be warranted. 2. 9 mm hypervascular lesion identified in segment IV on the previous study is not identified today. Attention on follow-up is recommended. 3. 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 4. Cholelithiasis.   ASSESSMENT & PLAN: 67 y.o. Caucasian male with past medical history of successfully treated hepatitis C, liver cirrhosis, history of ascites and encephalopathy, recurrent HCC   1. Recurrent HCC, initially stage II -I previously reviewed his previous image and outside medical records extensively, confirmed key findings with patient and his family members -He initially had a multifocal disease, status post TACE 3 times in 2015 -he developed symptomatic local recurrence in 08/2015, with a 5cm new lesion in segment 8 with direct invasion into portal vein, and a portocaval lymph node. He is not a candidate for liver targeted therapy per  his IR Dr. Delice Lesch -He was started on first line sorafenib, tolerated poorly. His previous CT abdomen and pelvis on 01/25/2016 revealed extensive portal  hypertension, and a probable cancer progression in the liver. -His treatment has changed to immunotherapy Nivolumab on 02/04/16, he tolerated very well, and clinically he is doing much better, with less pain and better energy. Continue.  -I previously discussed his restaging CT scan which was done on 02/14/2016, it actually showed decreased liver cancer size compared to the scan a month ago. No other new metastasis. -restaging CT from 04/17/2016 showed no visible liver mass, but the imaging was very limited due to his liver cirrhosis  -I previously reviewed his recent abdominal MRI from 05/21/2016, which showed no visible mass in the liver. He has had complete radiographic response. -I reviewed his restaging MRI from 08/25/2016, which showed no visible liver mass, but the images quality with significant compromised due to the motion.  -Nivo has previously been changed to 480mg  every 4 weeks in 08/2016. Due to poor toleration (fatigue) I changed his nivo back to 240mg  every 2 weeks starting 01/02/17.  -We discussed his MRI abd and CT chest from 01/08/17 which showed no concern for metastatic disease in the chest. MRI shows two small new lesions in his liver. This was reviewed in tumor board, and close f/u with imaging was recommended -MRI Abdomen from 03/08/17 reveals that his 15 mm hypervascular lesion identified on the previous study is stable in the interval measuring 14 mm today, a lesion of this size demonstrating arterial phase hypervascularity and washout is consistent with LI-RADS 4, suggestive of but not diagnostic for hepatoma, 9 mm hypervascular lesion identified in segment IV on the previous study is not identified today. I dicussed results with pt. I recommend continue Nivolumab. -Labs reviewed and overall stable and adequate to proceed with nivo today and every 2 weeks. He will get lab every 4 weeks now, he has been stable  -F/u in 4 weeks  2. Abdominal pain, low back pain and nausea -His  abdominal pain his much improved since he started treatment -overall better now with oxycodone and OxyContin, he is on very high dose, overall pain is controlled  -I previously recommended him to see pain management clinic, he declined. - I previously prescribed flexeril in attempt to help with his pain. Due to his high tolerance, he can take up to 2 a day -The patient previously stated his pain was not well controlled, I increased his OxyContin from 80 mg every 8 hours to 160 mg every 12 hours, pain is better controlled now  -Advised the patient to remain active to aid with increased fatigue.  - I changed his dose of oxycodone from 30 mg to 20 mg on 02/12/17 he is doing well on this   3. 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 Since improving I suggest he reduce down to half tablet or once every other day.  -he will continue to [facility] with [facility]  -  He reports that he is not taking lasix as prescribed at this time.   5. Hep C, successfully treated -Continue follow-up with liver clinic NP [facility]  6. Anxiety and insomnia  -He has Xanax for anxiety. I previously refilled Xanax 1 mg. -continue Ativan 1 mg at bedtime as needed for insomnia, he knows not to take with Xanax together, he knows not to take during the day. He does not feel Ativan is working well for sleep -He is taking xanax for insomnia, refilled on 12/03/16  7. Fatigue and weight gain -He reports more fatigue and  weak and lately -His TSH has been moderately elevated lately, we discussed the side effect of hypothyroidism from Nivolumab, I previously referred him to Dr. Loanne Drilling for evaluation to see if he needs low-dose Synthroid -He continues to gain weight and his wife is concerned. -He does not eat regularly and eats 2 ensure boosts daily along with Megace as needed.  -I do not suspect his weight gain is related to fluid retention. I suggest he hold Megace and reduce ensure boosts to once daily. He should use a balanced diet and exercise to help.  -I previously advised him to hold megace as he has been gaining weight  -I advised him that taking a lot of narcotic pain medication can contribute to fatigue  8. HTN -He is on Hydralazine 50 mg daily, and follow-up of his primary care physician  9. Smoking - Patient has started back smoking due to the fear of not getting better - I previously recommended a smoking cessation program. He refused.  10. Depression - The patient reports feelings of depression related to his diagnosis. - He reports fears he is going to die soon. - I previously encouraged the patient that his recent scan shows an excellent response to treatment.  11. Iron deficient anemia  -His hemoglobin dropped to 7.3 as of 10/16/16, lab results was consistent with iron deficiency, possible related to occluded GI bleeding -She is on Xarelto, no clinical overt bleeding. -He received IV feraheme 10/16/16 and in 10/2016  -He had GI work up with upper endoscopy and colonoscopy per Dr. [name] on 11/02/16. Biopsy of the stomach showed mild chronic gastritis, negative for H. Pylori or malignancy. Colon and rectal polyps were negative for dysplasia or malignancy. He will f/u with GI again in 01/2017.  -anemia has resolved, Hg 12.3 on 01/01/17 -if he develops anemia or GI bleeding again, will stop Xarelto    11. Goal of care discussion, DNR/DNI  -We previously discussed the incurable nature of his  cancer, and the overall poor prognosis, especially if he does not have good response to chemotherapy or progress on chemo -The patient understands the goal of care is palliative. -he agrees with DNR/DNI    Plan  -MRI and Labs reviewed today and adequate to proceed with Nivo today and every 2 weeks -Lab every 4 weeks -F/u in 4 weeks    All questions were answered. The patient knows to call the clinic with any problems, questions or concerns.  I spent 20 minutes counseling the patient face to face. Pt's partner had many questions, and I answered to her satisfaction.  The total time spent in the appointment was 30 minutes and more than 50% was on counseling.  This document serves as a record of services personally performed by Truitt Merle, MD. It was created on her behalf by Theresia Bough, a trained medical scribe. The creation of this record is based on the scribe's  personal observations and the provider's statements to them.   I have reviewed the above documentation for accuracy and completeness, and I agree with the above.   Truitt Merle  03/12/2017

## 2017-03-11 ENCOUNTER — Other Ambulatory Visit: Payer: Self-pay | Admitting: Hematology

## 2017-03-11 DIAGNOSIS — C220 Liver cell carcinoma: Secondary | ICD-10-CM

## 2017-03-12 ENCOUNTER — Other Ambulatory Visit: Payer: Medicare Other

## 2017-03-12 ENCOUNTER — Encounter: Payer: Self-pay | Admitting: Hematology

## 2017-03-12 ENCOUNTER — Inpatient Hospital Stay: Payer: Medicare Other

## 2017-03-12 ENCOUNTER — Ambulatory Visit: Payer: Medicare Other

## 2017-03-12 ENCOUNTER — Telehealth: Payer: Self-pay

## 2017-03-12 ENCOUNTER — Inpatient Hospital Stay (HOSPITAL_BASED_OUTPATIENT_CLINIC_OR_DEPARTMENT_OTHER): Payer: Medicare Other | Admitting: Hematology

## 2017-03-12 VITALS — BP 168/69 | HR 84 | Temp 98.2°F | Resp 21 | Ht 73.0 in | Wt 210.9 lb

## 2017-03-12 DIAGNOSIS — G4700 Insomnia, unspecified: Secondary | ICD-10-CM | POA: Diagnosis not present

## 2017-03-12 DIAGNOSIS — Z7901 Long term (current) use of anticoagulants: Secondary | ICD-10-CM | POA: Diagnosis not present

## 2017-03-12 DIAGNOSIS — M545 Low back pain: Secondary | ICD-10-CM

## 2017-03-12 DIAGNOSIS — C220 Liver cell carcinoma: Secondary | ICD-10-CM

## 2017-03-12 DIAGNOSIS — K7469 Other cirrhosis of liver: Secondary | ICD-10-CM

## 2017-03-12 DIAGNOSIS — F419 Anxiety disorder, unspecified: Secondary | ICD-10-CM

## 2017-03-12 DIAGNOSIS — Z79899 Other long term (current) drug therapy: Secondary | ICD-10-CM

## 2017-03-12 DIAGNOSIS — D500 Iron deficiency anemia secondary to blood loss (chronic): Secondary | ICD-10-CM

## 2017-03-12 DIAGNOSIS — G893 Neoplasm related pain (acute) (chronic): Secondary | ICD-10-CM

## 2017-03-12 DIAGNOSIS — I10 Essential (primary) hypertension: Secondary | ICD-10-CM

## 2017-03-12 DIAGNOSIS — K7031 Alcoholic cirrhosis of liver with ascites: Secondary | ICD-10-CM

## 2017-03-12 DIAGNOSIS — F17210 Nicotine dependence, cigarettes, uncomplicated: Secondary | ICD-10-CM | POA: Diagnosis not present

## 2017-03-12 DIAGNOSIS — Z66 Do not resuscitate: Secondary | ICD-10-CM

## 2017-03-12 DIAGNOSIS — I81 Portal vein thrombosis: Secondary | ICD-10-CM | POA: Diagnosis not present

## 2017-03-12 DIAGNOSIS — Z8619 Personal history of other infectious and parasitic diseases: Secondary | ICD-10-CM

## 2017-03-12 DIAGNOSIS — G9340 Encephalopathy, unspecified: Secondary | ICD-10-CM | POA: Diagnosis not present

## 2017-03-12 DIAGNOSIS — Z5112 Encounter for antineoplastic immunotherapy: Secondary | ICD-10-CM | POA: Diagnosis not present

## 2017-03-12 LAB — CMP (CANCER CENTER ONLY)
ALT: 19 U/L (ref 0–55)
AST: 25 U/L (ref 5–34)
Albumin: 3.8 g/dL (ref 3.5–5.0)
Alkaline Phosphatase: 114 U/L (ref 40–150)
Anion gap: 10 (ref 3–11)
BILIRUBIN TOTAL: 0.4 mg/dL (ref 0.2–1.2)
BUN: 11 mg/dL (ref 7–26)
CO2: 21 mmol/L — ABNORMAL LOW (ref 22–29)
Calcium: 9.6 mg/dL (ref 8.4–10.4)
Chloride: 107 mmol/L (ref 98–109)
Creatinine: 1.32 mg/dL — ABNORMAL HIGH (ref 0.70–1.30)
GFR, EST NON AFRICAN AMERICAN: 55 mL/min — AB (ref >60)
Glucose, Bld: 121 mg/dL (ref 70–140)
POTASSIUM: 3.8 mmol/L (ref 3.5–5.1)
Sodium: 138 mmol/L (ref 136–145)
TOTAL PROTEIN: 7.5 g/dL (ref 6.4–8.3)

## 2017-03-12 LAB — CBC WITH DIFFERENTIAL (CANCER CENTER ONLY)
BASOS ABS: 0 10*3/uL (ref 0.0–0.1)
Basophils Relative: 1 %
EOS PCT: 4 %
Eosinophils Absolute: 0.3 10*3/uL (ref 0.0–0.5)
HCT: 36.6 % — ABNORMAL LOW (ref 38.4–49.9)
HEMOGLOBIN: 12.3 g/dL — AB (ref 13.0–17.1)
LYMPHS PCT: 30 %
Lymphs Abs: 2 10*3/uL (ref 0.9–3.3)
MCH: 29.3 pg (ref 27.2–33.4)
MCHC: 33.6 g/dL (ref 32.0–36.0)
MCV: 87.1 fL (ref 79.3–98.0)
Monocytes Absolute: 0.5 10*3/uL (ref 0.1–0.9)
Monocytes Relative: 8 %
NEUTROS PCT: 57 %
Neutro Abs: 3.8 10*3/uL (ref 1.5–6.5)
PLATELETS: 165 10*3/uL (ref 140–400)
RBC: 4.21 MIL/uL (ref 4.20–5.82)
RDW: 13.8 % (ref 11.0–14.6)
WBC: 6.6 10*3/uL (ref 4.0–10.3)

## 2017-03-12 LAB — TSH: TSH: 4.849 u[IU]/mL — ABNORMAL HIGH (ref 0.320–4.118)

## 2017-03-12 MED ORDER — SODIUM CHLORIDE 0.9% FLUSH
10.0000 mL | INTRAVENOUS | Status: DC | PRN
  Administered 2017-03-12: 10 mL
  Filled 2017-03-12: qty 10

## 2017-03-12 MED ORDER — SODIUM CHLORIDE 0.9 % IV SOLN
240.0000 mg | Freq: Once | INTRAVENOUS | Status: AC
  Administered 2017-03-12: 240 mg via INTRAVENOUS
  Filled 2017-03-12: qty 24

## 2017-03-12 MED ORDER — SODIUM CHLORIDE 0.9 % IV SOLN
Freq: Once | INTRAVENOUS | Status: AC
  Administered 2017-03-12: 10:00:00 via INTRAVENOUS

## 2017-03-12 MED ORDER — HEPARIN SOD (PORK) LOCK FLUSH 100 UNIT/ML IV SOLN
500.0000 [IU] | Freq: Once | INTRAVENOUS | Status: AC | PRN
  Administered 2017-03-12: 500 [IU]
  Filled 2017-03-12: qty 5

## 2017-03-12 MED ORDER — SODIUM CHLORIDE 0.9% FLUSH
10.0000 mL | Freq: Once | INTRAVENOUS | Status: AC
Start: 2017-03-12 — End: 2017-03-12
  Administered 2017-03-12: 10 mL
  Filled 2017-03-12: qty 10

## 2017-03-12 NOTE — Telephone Encounter (Signed)
Printed avs and calender of upcoming appointment. Per 2/22 los

## 2017-03-12 NOTE — Patient Instructions (Signed)
[facility] Discharge Instructions for Patients Receiving Chemotherapy  Today you received the following chemotherapy agents Opdivo  To help prevent nausea and vomiting after your treatment, we encourage you to take your nausea medication as directed   If you develop nausea and vomiting that is not controlled by your nausea medication, call the clinic.   BELOW ARE SYMPTOMS THAT SHOULD BE REPORTED IMMEDIATELY:  *FEVER GREATER THAN 100.5 F  *CHILLS WITH OR WITHOUT FEVER  NAUSEA AND VOMITING THAT IS NOT CONTROLLED WITH YOUR NAUSEA MEDICATION  *UNUSUAL SHORTNESS OF BREATH  *UNUSUAL BRUISING OR BLEEDING  TENDERNESS IN MOUTH AND THROAT WITH OR WITHOUT PRESENCE OF ULCERS  *URINARY PROBLEMS  *BOWEL PROBLEMS  UNUSUAL RASH Items with * indicate a potential emergency and should be followed up as soon as possible.  Feel free to call the clinic should you have any questions or concerns. The clinic phone number is [phone].  Please show the CHEMO ALERT CARD at check-in to the Emergency Department and triage nurse.   

## 2017-03-14 ENCOUNTER — Other Ambulatory Visit: Payer: Self-pay | Admitting: Hematology

## 2017-03-14 DIAGNOSIS — E039 Hypothyroidism, unspecified: Secondary | ICD-10-CM | POA: Insufficient documentation

## 2017-03-25 ENCOUNTER — Other Ambulatory Visit: Payer: Self-pay | Admitting: Hematology

## 2017-03-25 ENCOUNTER — Telehealth: Payer: Self-pay | Admitting: *Deleted

## 2017-03-25 DIAGNOSIS — T451X5A Adverse effect of antineoplastic and immunosuppressive drugs, initial encounter: Secondary | ICD-10-CM

## 2017-03-25 DIAGNOSIS — C220 Liver cell carcinoma: Secondary | ICD-10-CM

## 2017-03-25 DIAGNOSIS — D6481 Anemia due to antineoplastic chemotherapy: Secondary | ICD-10-CM

## 2017-03-25 MED ORDER — OXYCODONE HCL ER 80 MG PO T12A: 160.0000 mg | EXTENDED_RELEASE_TABLET | Freq: Two times a day (BID) | ORAL | 0 refills | Status: DC

## 2017-03-25 MED ORDER — ALPRAZOLAM 1 MG PO TABS: 1.0000 mg | ORAL_TABLET | Freq: Four times a day (QID) | ORAL | 0 refills | Status: DC | PRN

## 2017-03-25 MED ORDER — OXYCODONE HCL 20 MG PO TABS: 20.0000 mg | ORAL_TABLET | ORAL | 0 refills | Status: DC | PRN

## 2017-03-25 MED FILL — oxyCODONE HCL 20 MG TABS: 20 | 20 days supply | Qty: 120 | Fill #0

## 2017-03-25 NOTE — Telephone Encounter (Signed)
Pt called requesting refills of  Oxycontin, Oxycodone, and Xanax. Noted scripts given to pt  :  Oxycontin     02/26/17     #  120 Oxycodone   03/03/17   #  120 Xanax           02/26/17     #  120.  Pt's    Phone      [phone].

## 2017-03-25 NOTE — Telephone Encounter (Signed)
I refilled, and informed pt.   Truitt Merle MD

## 2017-03-26 ENCOUNTER — Other Ambulatory Visit: Payer: Medicare Other

## 2017-03-26 ENCOUNTER — Ambulatory Visit: Payer: Medicare Other

## 2017-03-26 MED FILL — OxyCONTIN 80 MG T12A: 80 | 30 days supply | Qty: 120 | Fill #0

## 2017-03-26 MED FILL — ALPRAZolam 1 MG TABS: 1 | 30 days supply | Qty: 120 | Fill #0

## 2017-04-07 NOTE — Progress Notes (Signed)
[place]  [phone] [phone]  Clinic Follow up Note   Patient Care Team: Antonietta Jewel, MD as PCP - General (Internal Medicine) [name], Cristopher Estimable, MD as Attending Physician (Gastroenterology) Antonietta Jewel, MD as Referring Physician (Internal Medicine) Truitt Merle, MD as Consulting Physician (Hematology) [name], CRNP as Nurse Practitioner (Nurse Practitioner)   Date of Service:  04/09/2017   CHIEF COMPLAINTS:  Follow up recurrent [place]   Oncology History   Hepatocellular carcinoma [facility])   Staging form: Liver (Excluding Intrahepatic Bile Ducts), AJCC 7th Edition   - Clinical stage from 04/16/2012: Stage II (T2(m), N0, M0) - Signed by Truitt Merle, MD on 10/12/2015   - Pathologic stage from 04/16/2013: Stage II (T2, N0, cM0) - Signed by Truitt Merle, MD on 10/12/2015      Hepatocellular carcinoma ([place])   09/14/2012 Tumor Marker    AFP 28.9      04/16/2013 Imaging    Abdominal MRI with and without contrast reviewed multifocal (4) liver lesions in both left and right lobes, most consistent with HCC, largest measuring 2.7 cm      04/16/2013 Initial Diagnosis    Hepatocellular carcinoma ([place])      04/28/2013 Procedure    Right TACE with lipiodol       06/15/2013 Procedure    Left TACE with [facility]       07/14/2013 Procedure    Right TACE with [facility]      09/05/2015 Imaging    CT abdomen with and without contrast showed a new 5.0 x 2.3 cm mass in the hepatic segment 8, invading and occluding the right anterior portal vein, most compatible with [place]. A portacaval node measuring 3.0 x 1.4 cm, previously 2.9 x 1.1 cm.      10/11/2015 Tumor Marker    AFP 5.3      11/11/2015 - 02/03/2016 Chemotherapy    sorafenib 200mg  bid started on 11/11/2015, increased to 400mg  bid in 2 weeks, stopped due to poor tolerance and probable disease progression       11/14/2015 Imaging    CT CAP 11/14/2015 IMPRESSION: Chest Impression: 1. RIGHT upper lobe pulmonary  nodule is indeterminate. No comparison available. 2. Ground-glass opacity and peripheral nodules in the RIGHT middle lobe appear post infectious or inflammatory.  Abdomen / Pelvis Impression: 1. 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 Thrombus also likely within the LEFT and RIGHT portal vein. Difficult to distinguish tumor thrombus versus bland thrombus. 2. Individual lesions are difficult to define in the RIGHT hepatic lobe. There is overall impression of progression of disease in the RIGHT hepatic lobe with multiple ill-defined enhancing lesions. 3. Periportal and shotty retroperitoneal adenopathy similar to prior.      01/25/2016 Imaging    CT Abdomen Pelvis w/ Contrast IMPRESSION: 1. Findings consistent with multifocal liver carcinoma with extensive portal vein thrombosis, and subsequent development of porta hepatis venous collaterals. Thrombus in the central portal vein is no longer expansile, but still expands the more peripheral portal vein and the right and left portal veins. There are few right liver bile ducts there are now dilated, which suggests mild progression of liver disease. 2. There is mild splenomegaly consistent or venous hypertension, which has mildly increased from the prior CT. 3. There are new lung abnormalities with interstitial thickening and ill-defined peribronchovascular nodular opacities mostly at the right lung base with a smaller area noted along  the medial left lower lobe. The etiology may be infectious. It may be inflammatory, possibly related to chemotherapy. Neoplastic disease is also possible. 4. There is no other evidence of metastatic disease within the abdomen or pelvis. No acute findings within the abdomen or pelvis      01/25/2016 - 01/25/2016 Hospital Admission    Pt was seen in ED for worsening abdominal pain, treated with pain meds        02/04/2016 -  Chemotherapy    The patient started on immunotherapy treatment nivolumab every 2 weeks; on 04/30/16 dose was reduced to 420mg  every 3 weeks       02/14/2016 Imaging    CT CAP w/ contrast 1. Slight interval increase in size of the mediastinal lymph nodes. 2. Persistent extensive lower lobe peribronchial thickening and patchy infiltrates. Possible aspiration pneumonia. 3. Persistent tree-in-bud appearance in the right middle lobe, likely chronic inflammation or atypical infection. 4. Improved CT appearance of the liver. Two small residual arterial phase enhancing lesions are identified. 5. Chronic portal vein thrombosis with cavernous transformation of portal venous collaterals. Esophageal varices are again noted. 6. Stable upper abdominal lymph nodes likely related to cirrhosis.       04/16/2016 Tumor Marker    AFP 4.1       04/17/2016 Imaging     CT CAP W PELVIS IMPRESSION: 1. Stable mild mediastinal lymphadenopathy. 2. Interval improvement in the bibasilar peribronchial thickening, airway impaction, and peribronchovascular nodularity. Imaging features suggest marked improvement in bilateral low the atypical infection. Changes in the right middle lobe with more stable and may represent scarring from previous infectious/inflammatory etiology. 3. The small hypervascular lesions seen in the anterior left liver on the previous study are not evident today. There is some subtle, ill-defined hyperenhancement in the central left liver which is nonspecific. Attention to this area on followup imaging recommended. 4. Chronic portal vein inclusion with cavernous transformation in the porta hepatis and paraesophageal varices. 5. Stable appearance hepatoduodenal and retroperitoneal lymphadenopathy, likely chronic and related to liver disease.      04/17/2016 Imaging    CT CAP CONTRAST IMPRESSION: 1. Stable mild mediastinal lymphadenopathy. 2. Interval improvement in the bibasilar  peribronchial thickening, airway impaction, and peribronchovascular nodularity. Imaging features suggest marked improvement in bilateral low the atypical infection. Changes in the right middle lobe with more stable and may represent scarring from previous infectious/inflammatory etiology. 3. The small hypervascular lesions seen in the anterior left liver on the previous study are not evident today. There is some subtle, ill-defined hyperenhancement in the central left liver which is nonspecific. Attention to this area on followup imaging recommended. 4. Chronic portal vein inclusion with cavernous transformation in the porta hepatis and paraesophageal varices. 5. Stable appearance hepatoduodenal and retroperitoneal lymphadenopathy, likely chronic and related to liver disease.      05/21/2016 Imaging    MR Abdomen W WO Contrast IMPRESSION: 1. Limited motion degraded scan. Cirrhosis. No evidence of a liver mass within these limitations. 2. Mild splenomegaly. No ascites. Stable mild paraumbilical and gastroesophageal varices. 3. Stable nonspecific mild retroperitoneal lymphadenopathy. 4. 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      06/04/2016 Tumor Marker    AFP 3.5       08/25/2016 Imaging    MR Abdomen w wo contrast  IMPRESSION: 1. Mild-to-moderate motion degradation, preferentially involving the pre and postcontrast dynamic images. On follow-up, given the extent of motion on this exam, multiphase CT may be preferred.  2. Given these limitations, no evidence of hepatocellular carcinoma. 3. Marked cirrhosis and portal venous hypertension. 4. Slight increase in abdominal adenopathy, which is most likely reactive. Recommend attention on follow-up. 5. Cholelithiasis.       11/02/2016 Procedure    Upper endoscopy and colonoscopy  Diagnosis 1. Stomach, biopsy - MILD CHRONIC GASTRITIS. - NEGATIVE FOR HELICOBACTER PYLORI. - NO  INTESTINAL METAPLASIA, DYSPLASIA, OR MALIGNANCY. 2. Colon, polyp(s), opposite ileocecal valve - TUBULAR ADENOMA. - NO HIGH GRADE DYSPLASIA OR MALIGNANCY. 3. Rectum, polyp(s) - HYPERPLASTIC POLYP. - NO DYSPLASIA OR MALIGNANCY.      01/08/2017 Imaging    CT Chest WO Contrast 01/08/17  IMPRESSION: 1. Stable chest CT. 2. Prominent to mildly enlarged mediastinal lymph nodes are stable. Based on stability, these are favored to be reactive. 3. Stable emphysema and right upper lobe nodularity. 4. Mild atherosclerosis.      01/08/2017 Imaging    MRI Abd W WO Contrast 01/08/17  IMPRESSION: 1. Cirrhotic liver now with severe iron deposition throughout the hepatic parenchyma, as well as areas of developing confluent hepatic fibrosis. Today's study clearly demonstrates 2 new hypervascular nodules in segment 4A of the liver, which demonstrate washout but no definite pseudocapsule, the largest which measures 1.5 cm. At this time, these are considered suspicious for hepatocellular carcinoma, bladder classified as Li-RADS 3 lesions. 2. 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 3. Cholelithiasis without evidence of acute cholecystitis at this time. 4. Mild splenomegaly.      03/08/2017 Imaging    MRI Abdomen W WO Contrast 03/08/17 IMPRESSION: 1. 15 mm hypervascular lesion identified on the previous study is stable in the interval measuring 14 mm today. A lesion of this size demonstrating arterial phase hypervascularity and washout is consistent with LI-RADS 4, suggestive of but not diagnostic for hepatoma. Follow-up MRI in 3 months may be warranted. 2. 9 mm hypervascular lesion identified in segment IV on the previous study is not identified today. Attention on follow-up is recommended. 3. 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 4. Cholelithiasis.        HISTORY OF PRESENTING ILLNESS (10/11/2015):  Kevin Dillon 67 y.o. male with past medical history of treated hepatitis C, liver cirrhosis, and hepatocellular carcinoma is here because of recurrent hepatocellular carcinoma. He is accompanied by his significant other [name] and her sister.  He was diagnosed with hepatocellular carcinoma in 2015, his initial image result is not available and staging is unknown. He was seen by interventional radiologist Dr. Delice Lesch at [facility] in [place] and underwent TACE procedure 3 times in 2015. He was subsequently followed, and recently repeated CT scan showed a new 5.0 cm mass in segment 8, with portal vein invasion. He was felt not to be a candidate for further liver targeted therapy, and was referred to [place] for further systemic therapy.  He complains about mid epigastric pain for the past 2 years, which significantly improved after his TACE procedure in 2015. It has been getting worse lately in the past 6 months. He states he gets about 7-8 out of 10, persistent, he has been taking oxycodone 20 mg every 6 hours, but his pain is not well controlled. He is quite fatigued, is low appetite. He last 40 lbs in the pat 4 months. He is able to function at home, in trying to remain to be physically active.  His hepatitis C was successfully treated. He  has history of hepatitis C, complicated with ascites and encephalopathy, but it has been well controlled with medical management.   CURRENT THERAPY:  Nivolumab 240mg  every 2 weeks, started on 02/04/2016; on 04/30/16 switched to every 3 weeks due to fatigue; change from 240mg  to 480mg  every 4 weeks starting 09/03/2016. Given his poor toleration, will reduce him to every 2 weeks at 240mg  starting 01/02/17. Changed to every 3 weeks on 04/09/17  INTERIM HISTORY:   Kevin Dillon returns for follow up. He presents to the clinic today by himself. He notes he is doing well. His pain has been better managed. He is taking  Oxycontin and oxycodone daily. He is still doing yard work but this makes him feel fatigued. He does take breaks as needed.   On review of systems, pt denies any other complaints at this time. Pertinent positives are listed and detailed within the above HPI.   MEDICAL HISTORY:  Past Medical History:  Diagnosis Date  . Cancer ([place])    liver  . Cholelithiasis   . Chronic hepatitis C ([place])   . Chronic knee pain   . Chronic left shoulder pain   . CVA (cerebral infarction)   . Gout   . Hepatitis C    genotype 1b.  pt has been vaccinated against hep A and B.  . Hypertension   . Osteoarthritis   . Schatzki's ring   . Tubular adenoma of colon 12/2011    SURGICAL HISTORY: Past Surgical History:  Procedure Laterality Date  . AMPUTATION Left 09/17/2012   Procedure: SMALL FINGER EXTENSOR TENDON REPAIR; METACARPAL LEVEL AMPUTATION RING FINGER; PROXIMAL PHALANX LEVEL AMPUTATION LONG FINGER;  Surgeon: Schuyler Amor, MD;  Location: [facility];  Service: Orthopedics;  Laterality: Left;  . Arm surgery     right/plate in arm  . BIOPSY  11/02/2016   Procedure: BIOPSY;  Surgeon: Daneil Dolin, MD;  [facility];  Service: Endoscopy;;  gastric   . COLONOSCOPY WITH ESOPHAGOGASTRODUODENOSCOPY (EGD)  12/30/2011   RMR: Noncritical Schatzki's ring;  Hiatal hernia, Tubular ADENOMA removed from splenic flexure, otherwise normal colonoscopy  . COLONOSCOPY WITH PROPOFOL N/A 11/02/2016   Procedure: COLONOSCOPY WITH PROPOFOL;  Surgeon: Daneil Dolin, MD;  [facility];  Service: Endoscopy;  Laterality: N/A;  1045  . ESOPHAGOGASTRODUODENOSCOPY (EGD) WITH PROPOFOL N/A 11/02/2016   Procedure: ESOPHAGOGASTRODUODENOSCOPY (EGD) WITH PROPOFOL;  Surgeon: Daneil Dolin, MD;  [facility];  Service: Endoscopy;  Laterality: N/A;  . IR FLUORO GUIDE PORT INSERTION RIGHT  10/21/2016  . IR US GUIDE VASC ACCESS RIGHT  10/21/2016  . LEG SURGERY     left  . POLYPECTOMY  11/02/2016    Procedure: POLYPECTOMY;  Surgeon: Daneil Dolin, MD;  [facility];  Service: Endoscopy;;  colon  . WOUND EXPLORATION Left 09/17/2012   Procedure: WOUND EXPLORATION;  Surgeon: Schuyler Amor, MD;  Location: [facility];  Service: Orthopedics;  Laterality: Left;    SOCIAL HISTORY: Social History   Socioeconomic History  . Marital status: Significant Other    Spouse name: Not on file  . Number of children: 0  . Years of education: Not on file  . Highest education level: Not on file  Occupational History  . Occupation: disabled  Social Needs  . Financial resource strain: Not on file  . Food insecurity:    Worry: Not on file    Inability: Not on file  . Transportation needs:    Medical:  Not on file    Non-medical: Not on file  Tobacco Use  . Smoking status: Former Smoker    Packs/day: 0.50    Years: 40.00    Pack years: 20.00    Types: Cigarettes    Last attempt to quit: 01/19/2012    Years since quitting: 5.2  . Smokeless tobacco: Never Used  . Tobacco comment: Smokes 1/2 pack of cigarettes daily  Substance and Sexual Activity  . Alcohol use: No    Comment: HX 2 beers 3-4 days per week; QUIT DEC 2013  . Drug use: No    Comment: Hx cocaine yrs ago, Last marijuana OCT 2013  . Sexual activity: Not Currently    Partners: Female  Lifestyle  . Physical activity:    Days per week: Not on file    Minutes per session: Not on file  . Stress: Not on file  Relationships  . Social connections:    Talks on phone: Not on file    Gets together: Not on file    Attends religious service: Not on file    Active member of club or organization: Not on file    Attends meetings of clubs or organizations: Not on file    Relationship status: Not on file  . Intimate partner violence:    Fear of current or ex partner: Not on file    Emotionally abused: Not on file    Physically abused: Not on file    Forced sexual activity: Not on file  Other Topics Concern  . Not on file   Social History Narrative   Lives w/ significant other, [name]    FAMILY HISTORY: Family History  Problem Relation Age of Onset  . Cirrhosis Father 1  . Cancer Father        liver cancer   . Lung cancer Mother 4  . Colon cancer Neg Hx     ALLERGIES:  has No Known Allergies.  MEDICATIONS:  Current Outpatient Medications  Medication Sig Dispense Refill  . ALPRAZolam (XANAX) 1 MG tablet Take 1 tablet (1 mg total) by mouth every 6 (six) hours as needed for anxiety. 120 tablet 0  . cyclobenzaprine (FLEXERIL) 5 MG tablet Take 1 tablet (5 mg total) by mouth 3 (three) times daily as needed for muscle spasms. 30 tablet 0  . hydrALAZINE (APRESOLINE) 25 MG tablet Take 75 mg by mouth daily.     Marland Kitchen lactulose (CHRONULAC) 10 GM/15ML solution Take 30 g by mouth daily as needed for moderate constipation.     . lidocaine-prilocaine (EMLA) cream Apply 1 application topically as needed. Apply to portacath 1 1/2 hours - 2 hours prior to procedures as needed. 30 g 1  . megestrol (MEGACE) 40 MG/ML suspension Take 400 mg by mouth daily.     . Multiple Vitamins-Minerals (CENTRUM SILVER PO) Take 1 tablet by mouth daily.     Marland Kitchen oxyCODONE (OXYCONTIN) 80 mg 12 hr tablet Take 2 tablets (160 mg total) by mouth every 12 (twelve) hours. 120 tablet 0  . Oxycodone HCl 20 MG TABS Take 1 tablet (20 mg total) by mouth every 4 (four) hours as needed. 120 tablet 0  . prochlorperazine (COMPAZINE) 10 MG tablet TAKE 1 TABLET BY MOUTH EVERY 8 HOURS AS NEEDED FOR NAUSEA/VOMITING (Patient taking differently: No sig reported) 30 tablet 1  . rivaroxaban (XARELTO) 20 MG TABS tablet TAKE 1 TABLET BY MOUTH DAILY WITH SUPPER. 30 tablet 3   No current facility-administered medications for this visit.  REVIEW OF SYSTEMS:  Constitutional: Denies fevers, chills or abnormal night sweats  (+) weight gain (+) fatigue Eyes: Denies blurriness of vision, double vision or watery eyes Ears, nose, mouth, throat, and face: Denies  mucositis or sore throat Respiratory: Denies cough, dyspnea or wheezes Cardiovascular: Denies palpitation, chest discomfort or lower extremity swelling Gastrointestinal:  Denies nausea, heartburn or change in bowel habits,  Skin: Denies abnormal skin rashes Lymphatics: Denies new lymphadenopathy or easy bruising Neurological:Denies numbness, tingling or new weaknesses Behavioral/Psych: normal Musculoskeletal: (+) back pain (+) lower extremity myalgia pain (+) pain controlled  All other systems were reviewed with the patient and are negative.  PHYSICAL EXAMINATION  ECOG PERFORMANCE STATUS: 2  Vitals:   04/09/17 1055  BP: (!) 159/64  Pulse: 89  Resp: 20  Temp: 97.9 F (36.6 C)  SpO2: 98%   Filed Weights   04/09/17 1055  Weight: 209 lb 11.2 oz (95.1 kg)     GENERAL:alert, no distress and comfortable SKIN: skin color, texture, turgor are normal, no rashes or significant lesions EYES: normal, conjunctiva are pink and non-injected, sclera clear OROPHARYNX:no exudate, no erythema and lips, buccal mucosa, and tongue normal  NECK: supple, thyroid normal size, non-tender, without nodularity LYMPH:  no palpable lymphadenopathy in the cervical, axillary or inguinal LUNGS: clear to auscultation and percussion with normal breathing effort HEART: regular rate & rhythm and no murmurs and no lower extremity edema ABDOMEN:(+) abdominal bloating, no tenderness Musculoskeletal:no cyanosis of digits and no clubbing  PSYCH: alert & oriented x 3 with fluent speech NEURO: no focal motor/sensory deficits  LABORATORY DATA:  I have reviewed the data as listed CBC Latest Ref Rng & Units 04/09/2017 03/12/2017 02/26/2017  WBC 4.0 - 10.3 K/uL 7.0 6.6 7.8  Hemoglobin 13.0 - 17.1 g/dL - - 11.5(L)  Hematocrit 38.4 - 49.9 % 33.1(L) 36.6(L) 33.5(L)  Platelets 140 - 400 K/uL 137(L) 165 165   CMP Latest Ref Rng & Units 04/09/2017 03/12/2017 02/26/2017  Glucose 70 - 140 mg/dL 122 121 141(H)  BUN 7 - 26 mg/dL 15  11 8   Creatinine 0.70 - 1.30 mg/dL 1.26 1.32(H) 1.17  Sodium 136 - 145 mmol/L 139 138 139  Potassium 3.5 - 5.1 mmol/L 3.9 3.8 3.7  Chloride 98 - 109 mmol/L 107 107 108  CO2 22 - 29 mmol/L 23 21(L) 22  Calcium 8.4 - 10.4 mg/dL 9.1 9.6 8.7  Total Protein 6.4 - 8.3 g/dL 7.2 7.5 6.9  Total Bilirubin 0.2 - 1.2 mg/dL 0.4 0.4 0.3  Alkaline Phos 40 - 150 U/L 106 114 90  AST 5 - 34 U/L 23 25 25   ALT 0 - 55 U/L 14 19 15     AFP:  09/14/2012: 28.9 10/11/2015: 5.3 11/01/2015: 3.7 12/02/2015: 3.7 01/22/2016: 3.4 02/18/2016: 7.1 03/17/16: 5.8 04/16/16: 4.1 06/04/2016: 3.5 07/16/2016:4.4 08/27/2016: 5.6 10/08/16: 4.3 11/05/16: 4.8 12/03/16: 5.4 01/01/17: 5.1 01/29/17: 4.5 02/26/17: 5.5  PATHOLOGY   Diagnosis 11/02/16  1. Stomach, biopsy - MILD CHRONIC GASTRITIS. - NEGATIVE FOR HELICOBACTER PYLORI. - NO INTESTINAL METAPLASIA, DYSPLASIA, OR MALIGNANCY. 2. Colon, polyp(s), opposite ileocecal valve - TUBULAR ADENOMA. - NO HIGH GRADE DYSPLASIA OR MALIGNANCY. 3. Rectum, polyp(s) - HYPERPLASTIC POLYP. - NO DYSPLASIA OR MALIGNANCY. Microscopic Comment 1. A Warthin-Starry stain is performed to determine the possibility of the presence of Helicobacter pylori. The Warthin-Starry stain is negative for organisms of Helicobacter pylori.   PROCEDURE  Upper Endoscopy by Dr. [name] 11/02/16  IMPRESSION:  - mildly obstructing Schatzki ring. - dilated with scope passage.  erosive reflux esophagitis - Small hiatal hernia. - Portal hypertensive gastropathy. biopsied - Normal duodenal bulb and second portion of the duodenum. Biopsied.   Colonoscopy by Dr. [name] 11/02/16  IMPRESSION:  - Two 3 to 5 mm polyps in the rectum and at the ileocecal valve, removed with a cold snare. Resected and retrieved. - Diverticulosis in the sigmoid colon and in the descending colon. - The examination was otherwise normal on direct and retroflexion views.   RADIOGRAPHIC STUDIES: I have personally reviewed the radiological  images as listed and agreed with the findings in the report.  04/17/16 CT CAP W PELVIS IMPRESSION: 1. Stable mild mediastinal lymphadenopathy. 2. Interval improvement in the bibasilar peribronchial thickening, airway impaction, and peribronchovascular nodularity. Imaging features suggest marked improvement in bilateral low the atypical infection. Changes in the right middle lobe with more stable and may represent scarring from previous infectious/inflammatory etiology. 3. The small hypervascular lesions seen in the anterior left liver on the previous study are not evident today. There is some subtle, ill-defined hyperenhancement in the central left liver which is nonspecific. Attention to this area on followup imaging recommended. 4. Chronic portal vein inclusion with cavernous transformation in the porta hepatis and paraesophageal varices. 5. Stable appearance hepatoduodenal and retroperitoneal lymphadenopathy, likely chronic and related to liver disease.   MR Abdomen W WO Contrast 05/21/16 IMPRESSION: 1. Limited motion degraded scan. Cirrhosis. No evidence of a liver mass within these limitations. 2. Mild splenomegaly. No ascites. Stable mild paraumbilical and gastroesophageal varices. 3. Stable nonspecific mild retroperitoneal lymphadenopathy. 4. 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  MR Abdomen w wo contrast 08/25/2016 IMPRESSION: 1. Mild-to-moderate motion degradation, preferentially involving the pre and postcontrast dynamic images. On follow-up, given the extent of motion on this exam, multiphase CT may be preferred. 2. Given these limitations, no evidence of hepatocellular carcinoma. 3. Marked cirrhosis and portal venous hypertension. 4. Slight increase in abdominal adenopathy, which is most likely reactive. Recommend attention on follow-up. 5. Cholelithiasis.   CT Chest WO Contrast 01/08/17  IMPRESSION: 1. Stable  chest CT. 2. Prominent to mildly enlarged mediastinal lymph nodes are stable. Based on stability, these are favored to be reactive. 3. Stable emphysema and right upper lobe nodularity. 4. Mild atherosclerosis.    MRI Abd W WO Contrast 01/08/17  IMPRESSION: 1. Cirrhotic liver now with severe iron deposition throughout the hepatic parenchyma, as well as areas of developing confluent hepatic fibrosis. Today's study clearly demonstrates 2 new hypervascular nodules in segment 4A of the liver, which demonstrate washout but no definite pseudocapsule, the largest which measures 1.5 cm. At this time, these are considered suspicious for hepatocellular carcinoma, bladder classified as Li-RADS 3 lesions. 2. 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 3. Cholelithiasis without evidence of acute cholecystitis at this time. 4. Mild splenomegaly.  MRI Abdomen W WO Contrast 03/08/17 IMPRESSION: 1. 15 mm hypervascular lesion identified on the previous study is stable in the interval measuring 14 mm today. A lesion of this size demonstrating arterial phase hypervascularity and washout is consistent with LI-RADS 4, suggestive of but not diagnostic for hepatoma. Follow-up MRI in 3 months may be warranted. 2. 9 mm hypervascular lesion identified in segment IV on the previous study is not identified today. Attention on follow-up is recommended. 3. 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 4. Cholelithiasis.   ASSESSMENT & PLAN: 67 y.o. Caucasian  male with past medical history of successfully treated hepatitis C, liver cirrhosis, history of ascites and encephalopathy, recurrent HCC   1. Recurrent HCC, initially stage II -I previously reviewed his previous image and outside medical records extensively, confirmed key findings with patient and his family members -He initially  had a multifocal disease, status post TACE 3 times in 2015 -he developed symptomatic local recurrence in 08/2015, with a 5cm new lesion in segment 8 with direct invasion into portal vein, and a portocaval lymph node. He is not a candidate for liver targeted therapy per his IR Dr. Delice Lesch -He was started on first line sorafenib, tolerated poorly. His previous CT abdomen and pelvis on 01/25/2016 revealed extensive portal hypertension, and a probable cancer progression in the liver. -His treatment has changed to immunotherapy Nivolumab on 02/04/16, he tolerated very well, and clinically he was doing much better, with less pain and better energy. Continue.  -I previously discussed his restaging CT scan which was done on 02/14/2016, it actually showed decreased liver cancer size compared to the scan a month ago. No other new metastasis. -restaging CT from 04/17/2016 showed no visible liver mass, but the imaging was very limited due to his liver cirrhosis  -I previously reviewed his recent abdominal MRI from 05/21/2016, which showed no visible mass in the liver. He has had complete radiographic response. -I reviewed his restaging MRI from 08/25/2016, which showed no visible liver mass, but the images quality with significant compromised due to the motion.  -Nivo has previously been changed to 480mg  every 4 weeks in 08/2016. Due to poor toleration (fatigue) I changed his nivo back to 240mg  every 2 weeks starting 01/02/17.  -We discussed his MRI abd and CT chest from 01/08/17 which showed no concern for metastatic disease in the chest. MRI shows two small new lesions in his liver. This was reviewed in tumor board, and close f/u with imaging was recommended -MRI Abdomen from 03/08/17 reveals that his 15 mm hypervascular lesion identified on the previous study is stable in the interval measuring 14 mm today, demonstrating arterial phase hypervascularity and washout is consistent with LI-RADS 4, suggestive of but not diagnostic  for hepatoma, 9 mm hypervascular lesion identified in segment IV on the previous study is not identified today. I dicussed results with pt. I recommended continue Nivolumab. -Labs reviewed and overall stable and adequate to proceed with nivo today. He requests to change treatment to every 3 weeks at 240 mg dose.  He knows the standard is every 2 weeks, but I will accommodate his request. -F/u in 3 weeks  2. Abdominal pain, low back pain and nausea -His abdominal pain his much improved since he started treatment -overall better now with oxycodone and OxyContin, he is on very high dose, overall pain is controlled  -I previously recommended him to see pain management clinic, he declined. - I previously prescribed flexeril in attempt to help with his pain. Due to his high tolerance, he can take up to 2 a day -The patient previously stated his pain was not well controlled, I increased his OxyContin from 80 mg every 8 hours to 160 mg every 12 hours, pain is better controlled now  -Advised the patient to remain active to aid with increased fatigue.  - I changed his dose of oxycodone from 30 mg to 20 mg on 02/12/17 he is doing well on this   3. 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 Since improving I suggest he reduce down to half tablet or once every other day.  -he will continue to follow up with [facility]  -He reports that he is not taking lasix as prescribed at this time.   5. Hep C,  successfully treated -Continue follow-up with liver clinic NP [facility]  6. Anxiety and insomnia  -He has Xanax for anxiety. I previously refilled Xanax 1 mg. -continue Ativan 1 mg at bedtime as needed for insomnia, he knows not to take with Xanax together, he knows not to take during the day. He does not feel Ativan is working well for sleep -He is taking xanax for insomnia, refilled on 12/03/16  7. Fatigue and weight gain -He reports more fatigue and weak and lately -His TSH has been moderately elevated lately, we discussed the side effect of hypothyroidism from Nivolumab, I previously referred him to Dr. Loanne Drilling for evaluation to see if he needs low-dose Synthroid -He continues to gain weight and his wife is concerned. -He does not eat regularly and eats 2 ensure boosts daily along with Megace as needed.  -I do not suspect his weight gain is related to fluid retention. I previously suggested he hold Megace and reduce ensure boosts to once daily. He should use a balanced diet and exercise to help.  -I previously advised him to hold megace as he has been gaining weight  -I advised him that taking a lot of narcotic pain medication can contribute to fatigue  8. HTN -He is on Hydralazine 50 mg daily, and follow-up of his primary care physician  9. Smoking - Patient has started back smoking due to the fear of not getting better - I previously recommended a smoking cessation program. He refused.  10. Depression - The patient reports feelings of depression related to his diagnosis. - He reports fears he is going to die soon. - I previously encouraged the patient that his recent scan shows an excellent response to treatment.  11. Iron deficient anemia  -His hemoglobin dropped to 7.3 as of 10/16/16, lab results was consistent with iron deficiency, possible related to occluded GI bleeding -She is on Xarelto, no clinical overt bleeding. -He received IV feraheme 10/16/16 and in 10/2016  -He had GI  work up with upper endoscopy and colonoscopy per Dr. [name] on 11/02/16. Biopsy of the stomach showed mild chronic gastritis, negative for H. Pylori or malignancy. Colon and rectal polyps were negative for dysplasia or malignancy. He will f/u with GI again in 01/2017.  -anemia has resolved, Hg 12.3 on 01/01/17 -if he develops anemia or GI bleeding again, will stop Xarelto   11. Goal of care discussion, DNR/DNI  -We previously discussed the incurable nature of his cancer, and the overall poor prognosis, especially if he does not have good response to chemotherapy or progress on chemo -The patient understands the goal of care is palliative. -he agrees with DNR/DNI    Plan  -Nivo today, changed to every 3 weeks -refilled Xarelto -F/u in 3 weeks    All questions were answered.  The patient knows to call the clinic with any problems, questions or concerns.  I spent 20 minutes counseling the patient face to face. Pt's partner had many questions, and I answered to her satisfaction.  The total time spent in the appointment was 25 minutes and more than 50% was on counseling.  This document serves as a record of services personally performed by Truitt Merle, MD. It was created on her behalf by Theresia Bough, a trained medical scribe. The creation of this record is based on the scribe's personal observations and the provider's statements to them.   I have reviewed the above documentation for accuracy and completeness, and I agree with the above.   Truitt Merle  04/09/2017 12:00 PM

## 2017-04-09 ENCOUNTER — Telehealth: Payer: Self-pay | Admitting: Hematology

## 2017-04-09 ENCOUNTER — Inpatient Hospital Stay: Payer: Medicare Other

## 2017-04-09 ENCOUNTER — Encounter: Payer: Self-pay | Admitting: Hematology

## 2017-04-09 ENCOUNTER — Inpatient Hospital Stay: Payer: Medicare Other | Attending: Hematology | Admitting: Hematology

## 2017-04-09 VITALS — BP 159/64 | HR 89 | Temp 97.9°F | Resp 20 | Ht 73.0 in | Wt 209.7 lb

## 2017-04-09 DIAGNOSIS — B182 Chronic viral hepatitis C: Secondary | ICD-10-CM | POA: Diagnosis not present

## 2017-04-09 DIAGNOSIS — G893 Neoplasm related pain (acute) (chronic): Secondary | ICD-10-CM

## 2017-04-09 DIAGNOSIS — C220 Liver cell carcinoma: Secondary | ICD-10-CM | POA: Diagnosis not present

## 2017-04-09 DIAGNOSIS — E039 Hypothyroidism, unspecified: Secondary | ICD-10-CM

## 2017-04-09 DIAGNOSIS — Z8619 Personal history of other infectious and parasitic diseases: Secondary | ICD-10-CM

## 2017-04-09 DIAGNOSIS — R946 Abnormal results of thyroid function studies: Secondary | ICD-10-CM | POA: Diagnosis not present

## 2017-04-09 DIAGNOSIS — R5383 Other fatigue: Secondary | ICD-10-CM

## 2017-04-09 DIAGNOSIS — F419 Anxiety disorder, unspecified: Secondary | ICD-10-CM | POA: Diagnosis not present

## 2017-04-09 DIAGNOSIS — K7031 Alcoholic cirrhosis of liver with ascites: Secondary | ICD-10-CM

## 2017-04-09 DIAGNOSIS — F17210 Nicotine dependence, cigarettes, uncomplicated: Secondary | ICD-10-CM | POA: Diagnosis not present

## 2017-04-09 DIAGNOSIS — M545 Low back pain: Secondary | ICD-10-CM | POA: Diagnosis not present

## 2017-04-09 DIAGNOSIS — I10 Essential (primary) hypertension: Secondary | ICD-10-CM

## 2017-04-09 DIAGNOSIS — I81 Portal vein thrombosis: Secondary | ICD-10-CM | POA: Diagnosis not present

## 2017-04-09 DIAGNOSIS — Z5112 Encounter for antineoplastic immunotherapy: Secondary | ICD-10-CM | POA: Diagnosis not present

## 2017-04-09 DIAGNOSIS — Z66 Do not resuscitate: Secondary | ICD-10-CM

## 2017-04-09 DIAGNOSIS — G4700 Insomnia, unspecified: Secondary | ICD-10-CM

## 2017-04-09 DIAGNOSIS — K7469 Other cirrhosis of liver: Secondary | ICD-10-CM

## 2017-04-09 DIAGNOSIS — Z7901 Long term (current) use of anticoagulants: Secondary | ICD-10-CM | POA: Diagnosis not present

## 2017-04-09 DIAGNOSIS — D6481 Anemia due to antineoplastic chemotherapy: Secondary | ICD-10-CM

## 2017-04-09 DIAGNOSIS — K5900 Constipation, unspecified: Secondary | ICD-10-CM

## 2017-04-09 DIAGNOSIS — F3289 Other specified depressive episodes: Secondary | ICD-10-CM | POA: Diagnosis not present

## 2017-04-09 DIAGNOSIS — T451X5A Adverse effect of antineoplastic and immunosuppressive drugs, initial encounter: Secondary | ICD-10-CM

## 2017-04-09 LAB — CMP (CANCER CENTER ONLY)
ALBUMIN: 3.7 g/dL (ref 3.5–5.0)
ALT: 14 U/L (ref 0–55)
ANION GAP: 9 (ref 3–11)
AST: 23 U/L (ref 5–34)
Alkaline Phosphatase: 106 U/L (ref 40–150)
BILIRUBIN TOTAL: 0.4 mg/dL (ref 0.2–1.2)
BUN: 15 mg/dL (ref 7–26)
CHLORIDE: 107 mmol/L (ref 98–109)
CO2: 23 mmol/L (ref 22–29)
Calcium: 9.1 mg/dL (ref 8.4–10.4)
Creatinine: 1.26 mg/dL (ref 0.70–1.30)
GFR, EST NON AFRICAN AMERICAN: 58 mL/min — AB (ref >60)
GFR, Est AFR Am: >60 mL/min (ref >60)
Glucose, Bld: 122 mg/dL (ref 70–140)
POTASSIUM: 3.9 mmol/L (ref 3.5–5.1)
Sodium: 139 mmol/L (ref 136–145)
TOTAL PROTEIN: 7.2 g/dL (ref 6.4–8.3)

## 2017-04-09 LAB — CBC WITH DIFFERENTIAL (CANCER CENTER ONLY)
BASOS ABS: 0 10*3/uL (ref 0.0–0.1)
Basophils Relative: 0 %
Eosinophils Absolute: 0.3 10*3/uL (ref 0.0–0.5)
Eosinophils Relative: 5 %
HEMATOCRIT: 33.1 % — AB (ref 38.4–49.9)
Hemoglobin: 11.2 g/dL — ABNORMAL LOW (ref 13.0–17.1)
LYMPHS ABS: 2 10*3/uL (ref 0.9–3.3)
LYMPHS PCT: 29 %
MCH: 29.4 pg (ref 27.2–33.4)
MCHC: 33.8 g/dL (ref 32.0–36.0)
MCV: 86.9 fL (ref 79.3–98.0)
Monocytes Absolute: 0.7 10*3/uL (ref 0.1–0.9)
Monocytes Relative: 10 %
Neutro Abs: 3.9 10*3/uL (ref 1.5–6.5)
Neutrophils Relative %: 56 %
Platelet Count: 137 10*3/uL — ABNORMAL LOW (ref 140–400)
RBC: 3.81 MIL/uL — AB (ref 4.20–5.82)
RDW: 13.7 % (ref 11.0–14.6)
WBC: 7 10*3/uL (ref 4.0–10.3)

## 2017-04-09 LAB — T4, FREE: FREE T4: 1.01 ng/dL (ref 0.61–1.12)

## 2017-04-09 MED ORDER — SODIUM CHLORIDE 0.9 % IV SOLN
240.0000 mg | Freq: Once | INTRAVENOUS | Status: AC
  Administered 2017-04-09: 240 mg via INTRAVENOUS
  Filled 2017-04-09: qty 24

## 2017-04-09 MED ORDER — SODIUM CHLORIDE 0.9% FLUSH
10.0000 mL | INTRAVENOUS | Status: DC | PRN
  Administered 2017-04-09: 10 mL
  Filled 2017-04-09: qty 10

## 2017-04-09 MED ORDER — RIVAROXABAN 20 MG PO TABS: ORAL_TABLET | ORAL | 3 refills | Status: DC

## 2017-04-09 MED ORDER — HEPARIN SOD (PORK) LOCK FLUSH 100 UNIT/ML IV SOLN
500.0000 [IU] | Freq: Once | INTRAVENOUS | Status: AC | PRN
  Administered 2017-04-09: 500 [IU]
  Filled 2017-04-09: qty 5

## 2017-04-09 MED ORDER — SODIUM CHLORIDE 0.9 % IV SOLN
Freq: Once | INTRAVENOUS | Status: AC
  Administered 2017-04-09: 12:00:00 via INTRAVENOUS

## 2017-04-09 MED FILL — XARELTO 20 MG TABLET: 20 | 30 days supply | Qty: 30 | Fill #1

## 2017-04-09 NOTE — Patient Instructions (Signed)
[facility] Discharge Instructions for Patients Receiving Chemotherapy  Today you received the following chemotherapy agents nivolumab (Opdivo)  To help prevent nausea and vomiting after your treatment, we encourage you to take your nausea medication as directed  If you develop nausea and vomiting that is not controlled by your nausea medication, call the clinic.   BELOW ARE SYMPTOMS THAT SHOULD BE REPORTED IMMEDIATELY:  *FEVER GREATER THAN 100.5 F  *CHILLS WITH OR WITHOUT FEVER  NAUSEA AND VOMITING THAT IS NOT CONTROLLED WITH YOUR NAUSEA MEDICATION  *UNUSUAL SHORTNESS OF BREATH  *UNUSUAL BRUISING OR BLEEDING  TENDERNESS IN MOUTH AND THROAT WITH OR WITHOUT PRESENCE OF ULCERS  *URINARY PROBLEMS  *BOWEL PROBLEMS  UNUSUAL RASH Items with * indicate a potential emergency and should be followed up as soon as possible.  Feel free to call the clinic should you have any questions or concerns. The clinic phone number is [phone].  Please show the [facility] at check-in to the Emergency Department and triage nurse.

## 2017-04-09 NOTE — Telephone Encounter (Signed)
Appointments scheduled AVS printed per 3/22 los

## 2017-04-10 LAB — AFP TUMOR MARKER: AFP, SERUM, TUMOR MARKER: 4 ng/mL (ref 0.0–8.3)

## 2017-04-12 LAB — T3, REVERSE: T3, Reverse: 30.6 ng/dL — ABNORMAL HIGH (ref 9.2–24.1)

## 2017-04-14 ENCOUNTER — Other Ambulatory Visit: Payer: Self-pay | Admitting: Hematology

## 2017-04-14 ENCOUNTER — Telehealth: Payer: Self-pay | Admitting: *Deleted

## 2017-04-14 MED ORDER — OXYCODONE HCL 20 MG PO TABS: 20.0000 mg | ORAL_TABLET | ORAL | 0 refills | Status: DC | PRN

## 2017-04-14 MED FILL — oxyCODONE HCL 20 MG TABS: 20 | 20 days supply | Qty: 120 | Fill #0

## 2017-04-14 NOTE — Telephone Encounter (Signed)
Received call from wife Kevin Dillon requesting refill of Oxycodone 30 mg to [facility].   Kevin Dillon stated pt would like to pick up refill on Thursday  04/15/17.

## 2017-04-15 NOTE — Telephone Encounter (Signed)
It appears Dr. Burr Medico refilled oxycodone 20 mg on 04/14/17, just want to make sure this was done. Thanks, Regan Rakers

## 2017-04-22 ENCOUNTER — Other Ambulatory Visit: Payer: Self-pay | Admitting: Nurse Practitioner

## 2017-04-22 ENCOUNTER — Telehealth: Payer: Self-pay | Admitting: *Deleted

## 2017-04-22 DIAGNOSIS — C220 Liver cell carcinoma: Secondary | ICD-10-CM

## 2017-04-22 DIAGNOSIS — G4700 Insomnia, unspecified: Secondary | ICD-10-CM

## 2017-04-22 DIAGNOSIS — D6481 Anemia due to antineoplastic chemotherapy: Secondary | ICD-10-CM

## 2017-04-22 DIAGNOSIS — T451X5A Adverse effect of antineoplastic and immunosuppressive drugs, initial encounter: Secondary | ICD-10-CM

## 2017-04-22 MED ORDER — ALPRAZOLAM 1 MG PO TABS: 1.0000 mg | ORAL_TABLET | Freq: Four times a day (QID) | ORAL | 0 refills | Status: DC | PRN

## 2017-04-22 MED ORDER — OXYCODONE HCL ER 80 MG PO T12A: 160.0000 mg | EXTENDED_RELEASE_TABLET | Freq: Two times a day (BID) | ORAL | 0 refills | Status: DC

## 2017-04-22 NOTE — Telephone Encounter (Signed)
Done. Thanks, Regan Rakers

## 2017-04-22 NOTE — Telephone Encounter (Signed)
Received call from wife [name] requesting refills of  Oxycontin and  Xanax.   Pt uses  [facility].

## 2017-04-23 ENCOUNTER — Ambulatory Visit: Payer: Medicare Other

## 2017-04-23 ENCOUNTER — Other Ambulatory Visit: Payer: Self-pay | Admitting: Radiation Oncology

## 2017-04-23 MED FILL — OxyCONTIN 80 MG T12A: 80 | 30 days supply | Qty: 120 | Fill #0

## 2017-04-23 MED FILL — ALPRAZolam 1 MG TABS: 1 | 30 days supply | Qty: 120 | Fill #0

## 2017-04-27 NOTE — Progress Notes (Deleted)
Discussed case in Multidisciplinary Supportive Care Conference: - Recommend consideration of Cymbalta - Recommend consideration of Physical Therapy - Recommend Spiritual Consult - Given progressive cirrhosis over the past 5 years, consideration was also given to discussion of potential causes of liver injury (EtOH, etc.)

## 2017-04-29 NOTE — Progress Notes (Signed)
[name]  [phone] [phone]  Clinic Follow up Note   Patient Care Team: Antonietta Jewel, MD as PCP - General (Internal Medicine) [name], Cristopher Estimable, MD as Attending Physician (Gastroenterology) Antonietta Jewel, MD as Referring Physician (Internal Medicine) Truitt Merle, MD as Consulting Physician (Hematology) [name], CRNP as Nurse Practitioner (Nurse Practitioner)   Date of Service:  04/30/2017   CHIEF COMPLAINTS:  Follow up recurrent [place]   Oncology History   Hepatocellular carcinoma ([place])   Staging form: Liver (Excluding Intrahepatic Bile Ducts), AJCC 7th Edition   - Clinical stage from 04/16/2012: Stage II (T2(m), N0, M0) - Signed by Truitt Merle, MD on 10/12/2015   - Pathologic stage from 04/16/2013: Stage II (T2, N0, cM0) - Signed by Truitt Merle, MD on 10/12/2015      Hepatocellular carcinoma ([place])   09/14/2012 Tumor Marker    AFP 28.9      04/16/2013 Imaging    Abdominal MRI with and without contrast reviewed multifocal (4) liver lesions in both left and right lobes, most consistent with HCC, largest measuring 2.7 cm      04/16/2013 Initial Diagnosis    Hepatocellular carcinoma ([place])      04/28/2013 Procedure    Right TACE with lipiodol       06/15/2013 Procedure    Left TACE with [facility]       07/14/2013 Procedure    Right TACE with [facility]      09/05/2015 Imaging    CT abdomen with and without contrast showed a new 5.0 x 2.3 cm mass in the hepatic segment 8, invading and occluding the right anterior portal vein, most compatible with [place]. A portacaval node measuring 3.0 x 1.4 cm, previously 2.9 x 1.1 cm.      10/11/2015 Tumor Marker    AFP 5.3      11/11/2015 - 02/03/2016 Chemotherapy    sorafenib 200mg  bid started on 11/11/2015, increased to 400mg  bid in 2 weeks, stopped due to poor tolerance and probable disease progression       11/14/2015 Imaging    CT CAP 11/14/2015 IMPRESSION: Chest Impression: 1. RIGHT upper lobe pulmonary  nodule is indeterminate. No comparison available. 2. Ground-glass opacity and peripheral nodules in the RIGHT middle lobe appear post infectious or inflammatory.  Abdomen / Pelvis Impression: 1. 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 Thrombus also likely within the LEFT and RIGHT portal vein. Difficult to distinguish tumor thrombus versus bland thrombus. 2. Individual lesions are difficult to define in the RIGHT hepatic lobe. There is overall impression of progression of disease in the RIGHT hepatic lobe with multiple ill-defined enhancing lesions. 3. Periportal and shotty retroperitoneal adenopathy similar to prior.      01/25/2016 Imaging    CT Abdomen Pelvis w/ Contrast IMPRESSION: 1. Findings consistent with multifocal liver carcinoma with extensive portal vein thrombosis, and subsequent development of porta hepatis venous collaterals. Thrombus in the central portal vein is no longer expansile, but still expands the more peripheral portal vein and the right and left portal veins. There are few right liver bile ducts there are now dilated, which suggests mild progression of liver disease. 2. There is mild splenomegaly consistent or venous hypertension, which has mildly increased from the prior CT. 3. There are new lung abnormalities with interstitial thickening and ill-defined peribronchovascular nodular opacities mostly at the right lung base with a smaller area noted along  the medial left lower lobe. The etiology may be infectious. It may be inflammatory, possibly related to chemotherapy. Neoplastic disease is also possible. 4. There is no other evidence of metastatic disease within the abdomen or pelvis. No acute findings within the abdomen or pelvis      01/25/2016 - 01/25/2016 Hospital Admission    Pt was seen in ED for worsening abdominal pain, treated with pain meds        02/03/2016 -  Chemotherapy    The patient had nivolumab (OPDIVO) 240 mg in sodium chloride 0.9 % 100 mL chemo infusion, 240 mg, Intravenous, Once, 23 of 24 cycles Dose modification: 480 mg (original dose 240 mg, Cycle 12, Reason: Provider Judgment, Comment: every 4 weeks ), 240 mg (original dose 240 mg, Cycle 16, Reason: Provider Judgment) Administration: 240 mg (02/04/2016), 240 mg (02/18/2016), 240 mg (03/05/2016), 240 mg (03/20/2016), 240 mg (04/02/2016), 240 mg (04/16/2016), 240 mg (05/08/2016), 240 mg (06/04/2016), 240 mg (06/25/2016), 240 mg (07/16/2016), 240 mg (08/06/2016), 480 mg (09/03/2016), 480 mg (10/08/2016), 480 mg (11/05/2016), 480 mg (12/03/2016), 240 mg (01/02/2017), 240 mg (01/15/2017), 240 mg (01/29/2017), 240 mg (02/12/2017), 240 mg (02/26/2017), 240 mg (03/12/2017), 240 mg (04/09/2017), 240 mg (04/30/2017)  for chemotherapy treatment.       02/04/2016 -  Chemotherapy    The patient started on immunotherapy treatment nivolumab every 2 weeks; on 04/30/16 dose was reduced to 420mg  every 3 weeks       02/14/2016 Imaging    CT CAP w/ contrast 1. Slight interval increase in size of the mediastinal lymph nodes. 2. Persistent extensive lower lobe peribronchial thickening and patchy infiltrates. Possible aspiration pneumonia. 3. Persistent tree-in-bud appearance in the right middle lobe, likely chronic inflammation or atypical infection. 4. Improved CT appearance of the liver. Two small residual arterial phase enhancing lesions are identified. 5. Chronic portal vein thrombosis with cavernous transformation of portal venous collaterals. Esophageal varices are again noted. 6. Stable upper abdominal lymph nodes likely related to cirrhosis.       04/16/2016 Tumor Marker    AFP 4.1       04/17/2016 Imaging     CT CAP W PELVIS IMPRESSION: 1. Stable mild mediastinal lymphadenopathy. 2. Interval improvement in the bibasilar peribronchial thickening, airway impaction, and peribronchovascular nodularity.  Imaging features suggest marked improvement in bilateral low the atypical infection. Changes in the right middle lobe with more stable and may represent scarring from previous infectious/inflammatory etiology. 3. The small hypervascular lesions seen in the anterior left liver on the previous study are not evident today. There is some subtle, ill-defined hyperenhancement in the central left liver which is nonspecific. Attention to this area on followup imaging recommended. 4. Chronic portal vein inclusion with cavernous transformation in the porta hepatis and paraesophageal varices. 5. Stable appearance hepatoduodenal and retroperitoneal lymphadenopathy, likely chronic and related to liver disease.      04/17/2016 Imaging    CT CAP CONTRAST IMPRESSION: 1. Stable mild mediastinal lymphadenopathy. 2. Interval improvement in the bibasilar peribronchial thickening, airway impaction, and peribronchovascular nodularity. Imaging features suggest marked improvement in bilateral low the atypical infection. Changes in the right middle lobe with more stable and may represent scarring from previous infectious/inflammatory etiology. 3. The small hypervascular lesions seen in the anterior left liver on the previous study are not evident today. There is some subtle, ill-defined hyperenhancement in the central left liver which is nonspecific. Attention to this area on followup imaging recommended. 4. Chronic portal vein inclusion with cavernous transformation in  the porta hepatis and paraesophageal varices. 5. Stable appearance hepatoduodenal and retroperitoneal lymphadenopathy, likely chronic and related to liver disease.      05/21/2016 Imaging    MR Abdomen W WO Contrast IMPRESSION: 1. Limited motion degraded scan. Cirrhosis. No evidence of a liver mass within these limitations. 2. Mild splenomegaly. No ascites. Stable mild paraumbilical and gastroesophageal varices. 3. Stable nonspecific mild  retroperitoneal lymphadenopathy. 4. 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      06/04/2016 Tumor Marker    AFP 3.5       08/25/2016 Imaging    MR Abdomen w wo contrast  IMPRESSION: 1. Mild-to-moderate motion degradation, preferentially involving the pre and postcontrast dynamic images. On follow-up, given the extent of motion on this exam, multiphase CT may be preferred. 2. Given these limitations, no evidence of hepatocellular carcinoma. 3. Marked cirrhosis and portal venous hypertension. 4. Slight increase in abdominal adenopathy, which is most likely reactive. Recommend attention on follow-up. 5. Cholelithiasis.       11/02/2016 Procedure    Upper endoscopy and colonoscopy  Diagnosis 1. Stomach, biopsy - MILD CHRONIC GASTRITIS. - NEGATIVE FOR HELICOBACTER PYLORI. - NO INTESTINAL METAPLASIA, DYSPLASIA, OR MALIGNANCY. 2. Colon, polyp(s), opposite ileocecal valve - TUBULAR ADENOMA. - NO HIGH GRADE DYSPLASIA OR MALIGNANCY. 3. Rectum, polyp(s) - HYPERPLASTIC POLYP. - NO DYSPLASIA OR MALIGNANCY.      01/08/2017 Imaging    CT Chest WO Contrast 01/08/17  IMPRESSION: 1. Stable chest CT. 2. Prominent to mildly enlarged mediastinal lymph nodes are stable. Based on stability, these are favored to be reactive. 3. Stable emphysema and right upper lobe nodularity. 4. Mild atherosclerosis.      01/08/2017 Imaging    MRI Abd W WO Contrast 01/08/17  IMPRESSION: 1. Cirrhotic liver now with severe iron deposition throughout the hepatic parenchyma, as well as areas of developing confluent hepatic fibrosis. Today's study clearly demonstrates 2 new hypervascular nodules in segment 4A of the liver, which demonstrate washout but no definite pseudocapsule, the largest which measures 1.5 cm. At this time, these are considered suspicious for hepatocellular carcinoma, bladder classified as Li-RADS 3 lesions. 2. Occlusion of the portal  vein with cavernous transformation in the porta hepatis. 3. Cholelithiasis without evidence of acute cholecystitis at this time. 4. Mild splenomegaly.      03/08/2017 Imaging    MRI Abdomen W WO Contrast 03/08/17 IMPRESSION: 1. 15 mm hypervascular lesion identified on the previous study is stable in the interval measuring 14 mm today. A lesion of this size demonstrating arterial phase hypervascularity and washout is consistent with LI-RADS 4, suggestive of but not diagnostic for hepatoma. Follow-up MRI in 3 months may be warranted. 2. 9 mm hypervascular lesion identified in segment IV on the previous study is not identified today. Attention on follow-up is recommended. 3. 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 4. Cholelithiasis.       HISTORY OF PRESENTING ILLNESS (10/11/2015):  Kevin Dillon 67 y.o. male with past medical history of treated hepatitis C, liver cirrhosis, and hepatocellular carcinoma is here because of recurrent hepatocellular carcinoma. He is accompanied by his significant other [name] and her sister.  He was diagnosed with hepatocellular carcinoma in 2015, his initial image result is not available and staging is unknown. He was seen by interventional radiologist Dr. Delice Lesch at [facility] in [place] and underwent TACE procedure 3 times in 2015. He was  subsequently followed, and recently repeated CT scan showed a new 5.0 cm mass in segment 8, with portal vein invasion. He was felt not to be a candidate for further liver targeted therapy, and was referred to [place] for further systemic therapy.  He complains about mid epigastric pain for the past 2 years, which significantly improved after his TACE procedure in 2015. It has been getting worse lately in the past 6 months. He states he gets about 7-8 out of 10, persistent, he has been taking oxycodone 20 mg every 6 hours, but his  pain is not well controlled. He is quite fatigued, is low appetite. He last 40 lbs in the pat 4 months. He is able to function at home, in trying to remain to be physically active.  His hepatitis C was successfully treated. He has history of hepatitis C, complicated with ascites and encephalopathy, but it has been well controlled with medical management.   CURRENT THERAPY:  Nivolumab 240mg  every 2 weeks, started on 02/04/2016; on 04/30/16 switched to every 3 weeks due to fatigue; change from 240mg  to 480mg  every 4 weeks starting 09/03/2016. Given his poor toleration, will reduce him to every 2 weeks at 240mg  starting 01/02/17. Changed to every 3 weeks on 04/09/17  INTERIM HISTORY:   Kevin Dillon returns for follow up and Nivolumab infusion. He presents to the clinic today by himself. He notes he is doing well. He is suffering from seasonal allergies currently and he is using Clar[place]. He is congested with rhinorrhea. He also reports problems with constipation and his Colace was not working. He used magnesium citrate with a good response.   On review of systems, pt denies any other complaints at this time. Pertinent positives are listed and detailed within the above HPI.   MEDICAL HISTORY:  Past Medical History:  Diagnosis Date  . Cancer ([place])    liver  . Cholelithiasis   . Chronic hepatitis C ([place])   . Chronic knee pain   . Chronic left shoulder pain   . CVA (cerebral infarction)   . Gout   . Hepatitis C    genotype 1b.  pt has been vaccinated against hep A and B.  . Hypertension   . Osteoarthritis   . Schatzki's ring   . Tubular adenoma of colon 12/2011    SURGICAL HISTORY: Past Surgical History:  Procedure Laterality Date  . AMPUTATION Left 09/17/2012   Procedure: SMALL FINGER EXTENSOR TENDON REPAIR; METACARPAL LEVEL AMPUTATION RING FINGER; PROXIMAL PHALANX LEVEL AMPUTATION LONG FINGER;  Surgeon: Schuyler Amor, MD;  Location: [place];  Service: Orthopedics;  Laterality: Left;  .  Arm surgery     right/plate in arm  . BIOPSY  11/02/2016   Procedure: BIOPSY;  Surgeon: Daneil Dolin, MD;  [facility];  Service: Endoscopy;;  gastric   . COLONOSCOPY WITH ESOPHAGOGASTRODUODENOSCOPY (EGD)  12/30/2011   RMR: Noncritical Schatzki's ring;  Hiatal hernia, Tubular ADENOMA removed from splenic flexure, otherwise normal colonoscopy  . COLONOSCOPY WITH PROPOFOL N/A 11/02/2016   Procedure: COLONOSCOPY WITH PROPOFOL;  Surgeon: Daneil Dolin, MD;  [facility];  Service: Endoscopy;  Laterality: N/A;  1045  . ESOPHAGOGASTRODUODENOSCOPY (EGD) WITH PROPOFOL N/A 11/02/2016   Procedure: ESOPHAGOGASTRODUODENOSCOPY (EGD) WITH PROPOFOL;  Surgeon: Daneil Dolin, MD;  [facility];  Service: Endoscopy;  Laterality: N/A;  . IR FLUORO GUIDE PORT INSERTION RIGHT  10/21/2016  . IR US GUIDE VASC ACCESS RIGHT  10/21/2016  . LEG  SURGERY     left  . POLYPECTOMY  11/02/2016   Procedure: POLYPECTOMY;  Surgeon: Daneil Dolin, MD;  [facility];  Service: Endoscopy;;  colon  . WOUND EXPLORATION Left 09/17/2012   Procedure: WOUND EXPLORATION;  Surgeon: Schuyler Amor, MD;  Location: [place];  Service: Orthopedics;  Laterality: Left;    SOCIAL HISTORY: Social History   Socioeconomic History  . Marital status: Significant Other    Spouse name: Not on file  . Number of children: 0  . Years of education: Not on file  . Highest education level: Not on file  Occupational History  . Occupation: disabled  Social Needs  . Financial resource strain: Not on file  . Food insecurity:    Worry: Not on file    Inability: Not on file  . Transportation needs:    Medical: Not on file    Non-medical: Not on file  Tobacco Use  . Smoking status: Former Smoker    Packs/day: 0.50    Years: 40.00    Pack years: 20.00    Types: Cigarettes    Last attempt to quit: 01/19/2012    Years since quitting: 5.2  . Smokeless tobacco: Never Used  . Tobacco comment:  Smokes 1/2 pack of cigarettes daily  Substance and Sexual Activity  . Alcohol use: No    Comment: HX 2 beers 3-4 days per week; QUIT DEC 2013  . Drug use: No    Comment: Hx cocaine yrs ago, Last marijuana OCT 2013  . Sexual activity: Not Currently    Partners: Female  Lifestyle  . Physical activity:    Days per week: Not on file    Minutes per session: Not on file  . Stress: Not on file  Relationships  . Social connections:    Talks on phone: Not on file    Gets together: Not on file    Attends religious service: Not on file    Active member of club or organization: Not on file    Attends meetings of clubs or organizations: Not on file    Relationship status: Not on file  . Intimate partner violence:    Fear of current or ex partner: Not on file    Emotionally abused: Not on file    Physically abused: Not on file    Forced sexual activity: Not on file  Other Topics Concern  . Not on file  Social History Narrative   Lives w/ significant other, [name]    FAMILY HISTORY: Family History  Problem Relation Age of Onset  . Cirrhosis Father 53  . Cancer Father        liver cancer   . Lung cancer Mother 2  . Colon cancer Neg Hx     ALLERGIES:  has No Known Allergies.  MEDICATIONS:  Current Outpatient Medications  Medication Sig Dispense Refill  . ALPRAZolam (XANAX) 1 MG tablet Take 1 tablet (1 mg total) by mouth every 6 (six) hours as needed for anxiety. 120 tablet 0  . cyclobenzaprine (FLEXERIL) 5 MG tablet Take 1 tablet (5 mg total) by mouth 3 (three) times daily as needed for muscle spasms. 30 tablet 0  . DULoxetine (CYMBALTA) 60 MG capsule Take 1 capsule (60 mg total) by mouth daily. 30 capsule 3  . hydrALAZINE (APRESOLINE) 25 MG tablet Take 75 mg by mouth daily.     Marland Kitchen lactulose (CHRONULAC) 10 GM/15ML solution Take 30 g by mouth daily as needed for moderate  constipation.     . lidocaine-prilocaine (EMLA) cream Apply 1 application topically as needed. Apply to  portacath 1 1/2 hours - 2 hours prior to procedures as needed. 30 g 1  . megestrol (MEGACE) 40 MG/ML suspension Take 400 mg by mouth daily.     . Multiple Vitamins-Minerals (CENTRUM SILVER PO) Take 1 tablet by mouth daily.     Marland Kitchen oxyCODONE (OXYCONTIN) 80 mg 12 hr tablet Take 2 tablets (160 mg total) by mouth every 12 (twelve) hours. 120 tablet 0  . Oxycodone HCl 20 MG TABS Take 1 tablet (20 mg total) by mouth every 4 (four) hours as needed. 120 tablet 0  . prochlorperazine (COMPAZINE) 10 MG tablet TAKE 1 TABLET BY MOUTH EVERY 8 HOURS AS NEEDED FOR NAUSEA/VOMITING (Patient taking differently: No sig reported) 30 tablet 1  . rivaroxaban (XARELTO) 20 MG TABS tablet TAKE 1 TABLET BY MOUTH DAILY WITH SUPPER. 30 tablet 3   No current facility-administered medications for this visit.     REVIEW OF SYSTEMS:  Constitutional: Denies fevers, chills or abnormal night sweats  (+) weight gain (+) fatigue Eyes: Denies blurriness of vision, double vision or watery eyes Ears, nose, mouth, throat, and face: Denies mucositis or sore throat (+) seasonal allergies  Respiratory: Denies cough, dyspnea or wheezes Cardiovascular: Denies palpitation, chest discomfort or lower extremity swelling Gastrointestinal:  Denies nausea, heartburn or change in bowel habits, (+) constipation  Skin: Denies abnormal skin rashes Lymphatics: Denies new lymphadenopathy or easy bruising Neurological:Denies numbness, tingling or new weaknesses Behavioral/Psych: normal Musculoskeletal: (+) back pain (+) lower extremity myalgia pain (+) pain controlled  All other systems were reviewed with the patient and are negative.  PHYSICAL EXAMINATION  ECOG PERFORMANCE STATUS: 2  Vitals:   04/30/17 1318  BP: (!) 157/79  Pulse: 87  Resp: 17  Temp: 97.7 F (36.5 C)  SpO2: 96%   Filed Weights   04/30/17 1318  Weight: 207 lb 1.6 oz (93.9 kg)     GENERAL:alert, no distress and comfortable SKIN: skin color, texture, turgor are normal,  no rashes or significant lesions EYES: normal, conjunctiva are pink and non-injected, sclera clear OROPHARYNX:no exudate, no erythema and lips, buccal mucosa, and tongue normal  NECK: supple, thyroid normal size, non-tender, without nodularity LYMPH:  no palpable lymphadenopathy in the cervical, axillary or inguinal LUNGS: clear to auscultation and percussion with normal breathing effort HEART: regular rate & rhythm and no murmurs and no lower extremity edema ABDOMEN:(+) abdominal bloating, no tenderness Musculoskeletal:no cyanosis of digits and no clubbing  PSYCH: alert & oriented x 3 with fluent speech NEURO: no focal motor/sensory deficits  LABORATORY DATA:  I have reviewed the data as listed CBC Latest Ref Rng & Units 04/30/2017 04/09/2017 03/12/2017  WBC 4.0 - 10.3 K/uL 7.5 7.0 6.6  Hemoglobin 13.0 - 17.1 g/dL - - -  Hematocrit 38.4 - 49.9 % 32.3(L) 33.1(L) 36.6(L)  Platelets 140 - 400 K/uL 142 137(L) 165   CMP Latest Ref Rng & Units 04/30/2017 04/09/2017 03/12/2017  Glucose 70 - 140 mg/dL 121 122 121  BUN 7 - 26 mg/dL 13 15 11   Creatinine 0.70 - 1.30 mg/dL 1.36(H) 1.26 1.32(H)  Sodium 136 - 145 mmol/L 139 139 138  Potassium 3.5 - 5.1 mmol/L 4.0 3.9 3.8  Chloride 98 - 109 mmol/L 107 107 107  CO2 22 - 29 mmol/L 25 23 21(L)  Calcium 8.4 - 10.4 mg/dL 9.1 9.1 9.6  Total Protein 6.4 - 8.3 g/dL 7.1 7.2 7.5  Total  Bilirubin 0.2 - 1.2 mg/dL 0.3 0.4 0.4  Alkaline Phos 40 - 150 U/L 106 106 114  AST 5 - 34 U/L 19 23 25   ALT 0 - 55 U/L 13 14 19     AFP:  09/14/2012: 28.9 10/11/2015: 5.3 11/01/2015: 3.7 12/02/2015: 3.7 01/22/2016: 3.4 02/18/2016: 7.1 03/17/16: 5.8 04/16/16: 4.1 06/04/2016: 3.5 07/16/2016:4.4 08/27/2016: 5.6 10/08/16: 4.3 11/05/16: 4.8 12/03/16: 5.4 01/01/17: 5.1 01/29/17: 4.5 02/26/17: 5.5 04/09/17: 4.0  PATHOLOGY   Diagnosis 11/02/16  1. Stomach, biopsy - MILD CHRONIC GASTRITIS. - NEGATIVE FOR HELICOBACTER PYLORI. - NO INTESTINAL METAPLASIA, DYSPLASIA, OR  MALIGNANCY. 2. Colon, polyp(s), opposite ileocecal valve - TUBULAR ADENOMA. - NO HIGH GRADE DYSPLASIA OR MALIGNANCY. 3. Rectum, polyp(s) - HYPERPLASTIC POLYP. - NO DYSPLASIA OR MALIGNANCY. Microscopic Comment 1. A Warthin-Starry stain is performed to determine the possibility of the presence of Helicobacter pylori. The Warthin-Starry stain is negative for organisms of Helicobacter pylori.   PROCEDURE  Upper Endoscopy by Dr. [name] 11/02/16  IMPRESSION:  - mildly obstructing Schatzki ring. - dilated with scope passage. erosive reflux esophagitis - Small hiatal hernia. - Portal hypertensive gastropathy. biopsied - Normal duodenal bulb and second portion of the duodenum. Biopsied.   Colonoscopy by Dr. [name] 11/02/16  IMPRESSION:  - Two 3 to 5 mm polyps in the rectum and at the ileocecal valve, removed with a cold snare. Resected and retrieved. - Diverticulosis in the sigmoid colon and in the descending colon. - The examination was otherwise normal on direct and retroflexion views.   RADIOGRAPHIC STUDIES: I have personally reviewed the radiological images as listed and agreed with the findings in the report.  04/17/16 CT CAP W PELVIS IMPRESSION: 1. Stable mild mediastinal lymphadenopathy. 2. Interval improvement in the bibasilar peribronchial thickening, airway impaction, and peribronchovascular nodularity. Imaging features suggest marked improvement in bilateral low the atypical infection. Changes in the right middle lobe with more stable and may represent scarring from previous infectious/inflammatory etiology. 3. The small hypervascular lesions seen in the anterior left liver on the previous study are not evident today. There is some subtle, ill-defined hyperenhancement in the central left liver which is nonspecific. Attention to this area on followup imaging recommended. 4. Chronic portal vein inclusion with cavernous transformation in the porta hepatis and paraesophageal  varices. 5. Stable appearance hepatoduodenal and retroperitoneal lymphadenopathy, likely chronic and related to liver disease.   MR Abdomen W WO Contrast 05/21/16 IMPRESSION: 1. Limited motion degraded scan. Cirrhosis. No evidence of a liver mass within these limitations. 2. Mild splenomegaly. No ascites. Stable mild paraumbilical and gastroesophageal varices. 3. Stable nonspecific mild retroperitoneal lymphadenopathy. 4. 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  MR Abdomen w wo contrast 08/25/2016 IMPRESSION: 1. Mild-to-moderate motion degradation, preferentially involving the pre and postcontrast dynamic images. On follow-up, given the extent of motion on this exam, multiphase CT may be preferred. 2. Given these limitations, no evidence of hepatocellular carcinoma. 3. Marked cirrhosis and portal venous hypertension. 4. Slight increase in abdominal adenopathy, which is most likely reactive. Recommend attention on follow-up. 5. Cholelithiasis.   CT Chest WO Contrast 01/08/17  IMPRESSION: 1. Stable chest CT. 2. Prominent to mildly enlarged mediastinal lymph nodes are stable. Based on stability, these are favored to be reactive. 3. Stable emphysema and right upper lobe nodularity. 4. Mild atherosclerosis.    MRI Abd W WO Contrast 01/08/17  IMPRESSION: 1. Cirrhotic liver now with severe iron deposition throughout the hepatic parenchyma, as well as areas of developing confluent  hepatic fibrosis. Today's study clearly demonstrates 2 new hypervascular nodules in segment 4A of the liver, which demonstrate washout but no definite pseudocapsule, the largest which measures 1.5 cm. At this time, these are considered suspicious for hepatocellular carcinoma, bladder classified as Li-RADS 3 lesions. 2. 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 3. Cholelithiasis without evidence of acute cholecystitis  at this time. 4. Mild splenomegaly.  MRI Abdomen W WO Contrast 03/08/17 IMPRESSION: 1. 15 mm hypervascular lesion identified on the previous study is stable in the interval measuring 14 mm today. A lesion of this size demonstrating arterial phase hypervascularity and washout is consistent with LI-RADS 4, suggestive of but not diagnostic for hepatoma. Follow-up MRI in 3 months may be warranted. 2. 9 mm hypervascular lesion identified in segment IV on the previous study is not identified today. Attention on follow-up is recommended. 3. 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 4. Cholelithiasis.   ASSESSMENT & PLAN: 67 y.o. Caucasian male with past medical history of successfully treated hepatitis C, liver cirrhosis, history of ascites and encephalopathy, recurrent HCC   1. Recurrent HCC, initially stage II -I previously reviewed his previous image and outside medical records extensively, confirmed key findings with patient and his family members -He initially had a multifocal disease, status post TACE 3 times in 2015 -he developed symptomatic local recurrence in 08/2015, with a 5cm new lesion in segment 8 with direct invasion into portal vein, and a portocaval lymph node. He is not a candidate for liver targeted therapy per his IR Dr. Delice Lesch -He was started on first line sorafenib, tolerated poorly. His previous CT abdomen and pelvis on 01/25/2016 revealed extensive portal hypertension, and a probable cancer progression in the liver. -His treatment has changed to immunotherapy Nivolumab on 02/04/16, he tolerated very well, and clinically he was doing much better, with less pain and better energy. Continue.  -I previously discussed his restaging CT scan which was done on 02/14/2016, it actually showed decreased liver cancer size compared to the scan a month ago. No other new metastasis. -restaging CT from  04/17/2016 showed no visible liver mass, but the imaging was very limited due to his liver cirrhosis  -I previously reviewed his recent abdominal MRI from 05/21/2016, which showed no visible mass in the liver. He has had complete radiographic response. -I reviewed his restaging MRI from 08/25/2016, which showed no visible liver mass, but the images quality with significant compromised due to the motion.  -Nivo has previously been changed to 480mg  every 4 weeks in 08/2016. Due to poor toleration (fatigue) I changed his nivo back to 240mg  every 2 weeks starting 01/02/17.  -We discussed his MRI abd and CT chest from 01/08/17 which showed no concern for metastatic disease in the chest. MRI shows two small new lesions in his liver. This was reviewed in tumor board, and close f/u with imaging was recommended -MRI Abdomen from 03/08/17 reveals that his 15 mm hypervascular lesion identified on the previous study is stable in the interval measuring 14 mm today, demonstrating arterial phase hypervascularity and washout is consistent with LI-RADS 4, suggestive of but not diagnostic for hepatoma, 9 mm hypervascular lesion identified in segment IV on the previous study is not identified today. I previously discussed results with pt. I recommended continue Nivolumab. -On 04/09/17, he requested to change treatment to every 3 weeks at 240 mg dose due to the fatigue.  He  knows the standard is every 2 weeks, but I will accommodate his request. -He states he has fatigue from treatment that lasts for several days and he finally starts to feel better right before treatment starts again. He has expressed wanting to stop treatment. I discussed the high like hood of cancer progression after stopping therapy, and encourage him to continue treatment, and if there is no tumor growth, treatment can be stopped after 2 years therapy. He has been on treatment so far for 1 year and 3 months. I offered to let him switch to every 4 weeks. After a  lengthy discussion he agrees to proceed with same treatment every 3 weeks.  -Labs reviewed and overall stable and adequate to proceed with nivo today. -Plan for CT CAP in 6 weeks before he sees me  -F/u in 3 weeks with Lacie   2. Abdominal pain, low back pain and Constipation  -His abdominal pain his much improved since he started treatment -overall better now with oxycodone and OxyContin, he is on very high dose, overall pain is controlled  -I previously recommended him to see pain management clinic, he declined. - I previously prescribed flexeril in attempt to help with his pain. Due to his high tolerance, he can take up to 2 a day -The patient previously stated his pain was not well controlled, I increased his OxyContin from 80 mg every 8 hours to 160 mg every 12 hours, pain is better controlled now  -Advised the patient to remain active to aid with increased fatigue.  - I changed his dose of oxycodone from 30 mg to 20 mg on 02/12/17 he is doing well on this  -His constipation is likely related to his narcotic use, he uses colace and I advised him he can use Miralax or Synokot that is stronger.  -I had a lengthy conversation with pt today about reducing his narcotic pain medication and the risks associated with taking high dose (including shortening his life) for a long period of time. His cancer is well controlled currently and it should not cause him a lot of pain. I am concerned that he has narcotic addition, and his depression/anxiety may also contribute to his pain.  He is reluctant to reducing his dose. I offered Cymbalta today and he agreed to try. Our social worker Webb Silversmith will see him today and I recommend he see psychiatrist someone to help manage his depression/anxiety/addiction.   3. 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 Since improving I suggest he reduce down to half tablet or once every other day.  -he will continue to follow up with [facility]  -He reports that he is not taking lasix as prescribed at this time.   5. Hep C, successfully treated -Continue follow-up with liver clinic NP Dawn  6. Anxiety and insomnia  -He has Xanax for anxiety. I previously refilled Xanax 1 mg. -continue Ativan 1 mg at bedtime as needed for insomnia, he knows not to take with Xanax together,  he knows not to take during the day. He does not feel Ativan is working well for sleep -He is taking xanax for insomnia, refilled on 12/03/16  7. Fatigue and weight gain -He reports more fatigue and weak and lately -His TSH has been moderately elevated lately, we discussed the side effect of hypothyroidism from Nivolumab, I previously referred him to Dr. Loanne Drilling for evaluation to see if he needs low-dose Synthroid -He continues to gain weight and his wife is concerned. -He does not eat regularly and eats 2 ensure boosts daily along with Megace as needed.  -I do not suspect his weight gain is related to fluid retention. I previously suggested he hold Megace and reduce ensure boosts to once daily. He should use a balanced diet and exercise to help.  -I previously advised him to hold megace as he has been gaining weight  -I previously advised him that taking a lot of narcotic pain medication can contribute to fatigue  8. HTN -He is on Hydralazine 50 mg daily, and follow-up of his primary care physician  9. Smoking - Patient has started back smoking due to the fear of not getting better -  I previously recommended a smoking cessation program. He refused.  10. Depression - The patient reports feelings of depression related to his diagnosis. - He reports fears he is going to die soon. - I previously encouraged the patient that his recent scan shows an excellent response to treatment. -I am worried that he feels hopeless, he was seen by our social worker Webb Silversmith today and she is going to [facility]  11. Iron deficient anemia  -His hemoglobin dropped to 7.3 as of 10/16/16, lab results was consistent with iron deficiency, possible related to occluded GI bleeding -She is on Xarelto, no clinical overt bleeding. -He received IV feraheme 10/16/16 and in 10/2016  -He had GI work up with upper endoscopy and colonoscopy per Dr. [name] on 11/02/16. Biopsy of the stomach showed mild chronic gastritis, negative for H. Pylori or malignancy. Colon and rectal polyps were negative for dysplasia or malignancy. He will f/u with GI again in 01/2017.  -anemia has resolved, Hg 12.3 on 01/01/17 -if he develops anemia or GI bleeding again, will stop Xarelto   11. Goal of care discussion, DNR/DNI  -We previously discussed the incurable nature of his cancer, and the overall poor prognosis, especially if he does not have good response to chemotherapy or progress on chemo -The patient understands the goal of care is palliative. -he agrees with DNR/DNI    Plan  -Nivo today and every 3 weeks -prescribed Cymbalta today -social work referral, Webb Silversmith saw pt -CT CAP W Contrast in 6 weeks, will order on his next visit   -Lab and f/u with Lacie in 3 weeks and me in 6 weeks after scan  All questions were answered. The patient knows to call the clinic with any problems, questions or concerns.  I spent 30 minutes counseling the patient face to face. Pt's partner had many questions, and I answered to her satisfaction.  The total time spent in the appointment was 40 minutes and more  than 50% was on counseling.  This document serves as a record of services personally performed by Truitt Merle, MD. It was created on her behalf by Theresia Bough, a trained medical scribe. The creation of this record is based on the scribe's personal observations and the provider's statements to them.   I have reviewed  the above documentation for accuracy and completeness, and I agree with the above.   Truitt Merle  04/30/2017 2:06 PM

## 2017-04-30 ENCOUNTER — Encounter: Payer: Self-pay | Admitting: General Practice

## 2017-04-30 ENCOUNTER — Encounter: Payer: Self-pay | Admitting: Hematology

## 2017-04-30 ENCOUNTER — Inpatient Hospital Stay: Payer: Medicare Other

## 2017-04-30 ENCOUNTER — Inpatient Hospital Stay: Payer: Medicare Other | Attending: Hematology

## 2017-04-30 ENCOUNTER — Inpatient Hospital Stay (HOSPITAL_BASED_OUTPATIENT_CLINIC_OR_DEPARTMENT_OTHER): Payer: Medicare Other | Admitting: Hematology

## 2017-04-30 VITALS — BP 157/79 | HR 87 | Temp 97.7°F | Resp 17 | Ht 73.0 in | Wt 207.1 lb

## 2017-04-30 DIAGNOSIS — Z801 Family history of malignant neoplasm of trachea, bronchus and lung: Secondary | ICD-10-CM | POA: Diagnosis not present

## 2017-04-30 DIAGNOSIS — Z7901 Long term (current) use of anticoagulants: Secondary | ICD-10-CM | POA: Insufficient documentation

## 2017-04-30 DIAGNOSIS — Z800 Family history of malignant neoplasm of digestive organs: Secondary | ICD-10-CM | POA: Insufficient documentation

## 2017-04-30 DIAGNOSIS — G4700 Insomnia, unspecified: Secondary | ICD-10-CM | POA: Diagnosis not present

## 2017-04-30 DIAGNOSIS — F329 Major depressive disorder, single episode, unspecified: Secondary | ICD-10-CM

## 2017-04-30 DIAGNOSIS — K5900 Constipation, unspecified: Secondary | ICD-10-CM | POA: Diagnosis not present

## 2017-04-30 DIAGNOSIS — F17210 Nicotine dependence, cigarettes, uncomplicated: Secondary | ICD-10-CM | POA: Diagnosis not present

## 2017-04-30 DIAGNOSIS — C220 Liver cell carcinoma: Secondary | ICD-10-CM

## 2017-04-30 DIAGNOSIS — R635 Abnormal weight gain: Secondary | ICD-10-CM | POA: Diagnosis not present

## 2017-04-30 DIAGNOSIS — F419 Anxiety disorder, unspecified: Secondary | ICD-10-CM

## 2017-04-30 DIAGNOSIS — Z86718 Personal history of other venous thrombosis and embolism: Secondary | ICD-10-CM | POA: Diagnosis not present

## 2017-04-30 DIAGNOSIS — R530 Neoplastic (malignant) related fatigue: Secondary | ICD-10-CM | POA: Diagnosis not present

## 2017-04-30 DIAGNOSIS — Z8619 Personal history of other infectious and parasitic diseases: Secondary | ICD-10-CM

## 2017-04-30 DIAGNOSIS — G893 Neoplasm related pain (acute) (chronic): Secondary | ICD-10-CM | POA: Diagnosis not present

## 2017-04-30 DIAGNOSIS — K7469 Other cirrhosis of liver: Secondary | ICD-10-CM | POA: Insufficient documentation

## 2017-04-30 DIAGNOSIS — Z5112 Encounter for antineoplastic immunotherapy: Secondary | ICD-10-CM | POA: Insufficient documentation

## 2017-04-30 DIAGNOSIS — D500 Iron deficiency anemia secondary to blood loss (chronic): Secondary | ICD-10-CM

## 2017-04-30 LAB — CBC WITH DIFFERENTIAL (CANCER CENTER ONLY)
BASOS ABS: 0 10*3/uL (ref 0.0–0.1)
BASOS PCT: 0 %
EOS ABS: 0.3 10*3/uL (ref 0.0–0.5)
EOS PCT: 4 %
HEMATOCRIT: 32.3 % — AB (ref 38.4–49.9)
Hemoglobin: 11.2 g/dL — ABNORMAL LOW (ref 13.0–17.1)
Lymphocytes Relative: 25 %
Lymphs Abs: 1.9 10*3/uL (ref 0.9–3.3)
MCH: 29.9 pg (ref 27.2–33.4)
MCHC: 34.7 g/dL (ref 32.0–36.0)
MCV: 86.4 fL (ref 79.3–98.0)
MONO ABS: 0.6 10*3/uL (ref 0.1–0.9)
MONOS PCT: 8 %
NEUTROS ABS: 4.6 10*3/uL (ref 1.5–6.5)
Neutrophils Relative %: 63 %
PLATELETS: 142 10*3/uL (ref 140–400)
RBC: 3.74 MIL/uL — ABNORMAL LOW (ref 4.20–5.82)
RDW: 13.2 % (ref 11.0–14.6)
WBC Count: 7.5 10*3/uL (ref 4.0–10.3)

## 2017-04-30 LAB — CMP (CANCER CENTER ONLY)
ALK PHOS: 106 U/L (ref 40–150)
ALT: 13 U/L (ref 0–55)
AST: 19 U/L (ref 5–34)
Albumin: 3.6 g/dL (ref 3.5–5.0)
Anion gap: 7 (ref 3–11)
BUN: 13 mg/dL (ref 7–26)
CALCIUM: 9.1 mg/dL (ref 8.4–10.4)
CO2: 25 mmol/L (ref 22–29)
CREATININE: 1.36 mg/dL — AB (ref 0.70–1.30)
Chloride: 107 mmol/L (ref 98–109)
GFR, Estimated: 53 mL/min — ABNORMAL LOW (ref >60)
Glucose, Bld: 121 mg/dL (ref 70–140)
Potassium: 4 mmol/L (ref 3.5–5.1)
Sodium: 139 mmol/L (ref 136–145)
Total Bilirubin: 0.3 mg/dL (ref 0.2–1.2)
Total Protein: 7.1 g/dL (ref 6.4–8.3)

## 2017-04-30 MED ORDER — DULOXETINE HCL 60 MG PO CPEP: 60.0000 mg | ORAL_CAPSULE | Freq: Every day | ORAL | 3 refills | Status: DC

## 2017-04-30 MED ORDER — HEPARIN SOD (PORK) LOCK FLUSH 100 UNIT/ML IV SOLN
500.0000 [IU] | Freq: Once | INTRAVENOUS | Status: DC | PRN
  Filled 2017-04-30: qty 5

## 2017-04-30 MED ORDER — SODIUM CHLORIDE 0.9 % IV SOLN
240.0000 mg | Freq: Once | INTRAVENOUS | Status: AC
  Administered 2017-04-30: 240 mg via INTRAVENOUS
  Filled 2017-04-30: qty 24

## 2017-04-30 MED ORDER — SODIUM CHLORIDE 0.9% FLUSH
10.0000 mL | INTRAVENOUS | Status: DC | PRN
  Filled 2017-04-30: qty 10

## 2017-04-30 MED ORDER — SODIUM CHLORIDE 0.9% FLUSH
10.0000 mL | Freq: Once | INTRAVENOUS | Status: AC
  Administered 2017-04-30: 10 mL
  Filled 2017-04-30: qty 10

## 2017-04-30 MED ORDER — SODIUM CHLORIDE 0.9 % IV SOLN
Freq: Once | INTRAVENOUS | Status: AC
  Administered 2017-04-30: 15:00:00 via INTRAVENOUS

## 2017-04-30 MED FILL — DULoxetine HCL 60 MG CPEP: 60 | 30 days supply | Qty: 30 | Fill #0

## 2017-04-30 NOTE — Patient Instructions (Signed)
[facility] Discharge Instructions for Patients Receiving Chemotherapy  Today you received the following chemotherapy agents :  Opdivo.  To help prevent nausea and vomiting after your treatment, we encourage you to take your nausea medication as prescribed.   If you develop nausea and vomiting that is not controlled by your nausea medication, call the clinic.   BELOW ARE SYMPTOMS THAT SHOULD BE REPORTED IMMEDIATELY:  *FEVER GREATER THAN 100.5 F  *CHILLS WITH OR WITHOUT FEVER  NAUSEA AND VOMITING THAT IS NOT CONTROLLED WITH YOUR NAUSEA MEDICATION  *UNUSUAL SHORTNESS OF BREATH  *UNUSUAL BRUISING OR BLEEDING  TENDERNESS IN MOUTH AND THROAT WITH OR WITHOUT PRESENCE OF ULCERS  *URINARY PROBLEMS  *BOWEL PROBLEMS  UNUSUAL RASH Items with * indicate a potential emergency and should be followed up as soon as possible.  Feel free to call the clinic should you have any questions or concerns. The clinic phone number is [phone].  Please show the CHEMO ALERT CARD at check-in to the Emergency Department and triage nurse.   

## 2017-04-30 NOTE — Progress Notes (Signed)
   [facility] Work  Clinical Social Work was referred by medical oncologist Dr Burr Medico for assessment of psychosocial needs.  Clinical Social Worker met with patient at [facility] in exam room to offer support and assess for needs.  Per MD, patient needs to transition away from opiate pain management to other modalities for pain control.  CSW discussed issues w patient.  Patient describes diagnosis of cancer w "being given 6 months to live" as a "shock."  Describes multiple days of lethargy following each chemotherapy session, "I find it hard to even get out of bed some days."  Activities that used to bring pleasure are no longer enjoyable, "I used to like work work out in my yard, now I can only do 10 minutes", "I am used to working hard, now I [employer] for wife who has COPD and activity limitations.  States "some days its just too much for me, I'd just as soon go on."  CSW asked patient to clarify - patient reports that he has no plan for suicide, but is in significant pain from "my nerves", "anyone would be depressed if they were given 6 months to live."  Per oncologist, patients cancer is under good control, patient has lived well beyond initial 6 month prediction and oncologist feels patient can be encouraged to find ways to live w stable cancer and treatment.  Patient describes feeling anxious often, depressed mood most days.  "I take the pain pills because they just help me - sometimes my liver pain is really bad, I don't know what I'd do if I don't have them."  Patient reports that "I used to drink before I was diagnosed w Hep C, now I know it's bad for my liver."  States he has not used alcohol in 4 years.  Patient understands that oncologist will be tapering dose of opiates due to concerns about their long term toxicity, agreeable to "do whatever the doctor thinks is best."  Patient agreeable to referral for psychiatric assessment, advised that complete assessment should include discussion of  substance use issues as well as depression/anxiety.  Referral made to [facility] if suitable/timely appointment is available.  Patient given CSW contact information and asked to call in 1 - 2 weeks to check in w CSW.  Clinical Social Work interventions: Reflective listening Motivational interviewing Referral for psychiatric assessment, consider need for substance use assessment and/or therapy  Edwyna Shell, [place] Worker Phone:  [phone]

## 2017-05-01 ENCOUNTER — Encounter: Payer: Self-pay | Admitting: Hematology

## 2017-05-01 LAB — AFP TUMOR MARKER: AFP, Serum, Tumor Marker: 3.3 ng/mL (ref 0.0–8.3)

## 2017-05-03 ENCOUNTER — Telehealth: Payer: Self-pay | Admitting: Hematology

## 2017-05-03 NOTE — Telephone Encounter (Signed)
Next appointment scheduled per 4/12 los

## 2017-05-06 ENCOUNTER — Other Ambulatory Visit: Payer: Self-pay | Admitting: Hematology

## 2017-05-06 ENCOUNTER — Encounter: Payer: Self-pay | Admitting: General Practice

## 2017-05-06 MED ORDER — OXYCODONE HCL 20 MG PO TABS: 20.0000 mg | ORAL_TABLET | ORAL | 0 refills | Status: DC | PRN

## 2017-05-06 MED FILL — oxyCODONE HCL 20 MG TABS: 20 | 20 days supply | Qty: 120 | Fill #0

## 2017-05-06 NOTE — Progress Notes (Signed)
[facility] CSW Progress Notes  Call from patient to check in w CSW re mood and coping.  "I am feeling better after this chemo than before, I think I have a little more energy."  Aware that MD is tapering him off opioids.  Agreeable to "talking to someone about what's going on with me."  CSW consulted w substance abuse counselor at [facility] to assist w appropriate referral.  Counselor will contact CSW w suggestions for patient.  Edwyna Shell, LCSW Clinical Social Worker Phone:  [phone]

## 2017-05-06 NOTE — Telephone Encounter (Signed)
Received vm call from [facility] asking for refill on pt's oxycodone 20 mg to be sent to [facility] for p/u tomorrow.  Script sent electronically to [facility] by Dr Burr Medico.

## 2017-05-07 ENCOUNTER — Encounter: Payer: Self-pay | Admitting: General Practice

## 2017-05-07 NOTE — Progress Notes (Signed)
[facility] CSW Progress Notes  CSW called patient, reports he is feeling "up and down but maybe a little bit more energy."  CSW asked him to call at end of next week to check in, CSW will continue to monitor and refer if needed for more help.  Edwyna Shell, LCSW Clinical Social Worker Phone:  [phone]

## 2017-05-14 ENCOUNTER — Other Ambulatory Visit: Payer: Self-pay | Admitting: Nurse Practitioner

## 2017-05-14 DIAGNOSIS — G4700 Insomnia, unspecified: Secondary | ICD-10-CM

## 2017-05-14 DIAGNOSIS — C220 Liver cell carcinoma: Secondary | ICD-10-CM

## 2017-05-17 ENCOUNTER — Telehealth: Payer: Self-pay | Admitting: Nurse Practitioner

## 2017-05-17 NOTE — Telephone Encounter (Signed)
I called patient to assess pain and see if cymbalta is helping before I place orders for requested refills (xanax, oxycontin). He was not able to come to the phone. Male I spoke with was not sure if cymbalta has helped yet or if he is out of medication, she reports he expects refill to be available on Friday with his appointment. I requested she call back if he will run out of medicine before 5/3. She verbalizes understanding and appreciates the call back.

## 2017-05-18 ENCOUNTER — Other Ambulatory Visit: Payer: Self-pay | Admitting: Hematology

## 2017-05-18 MED FILL — XARELTO 20 MG TABLET: 20 | 30 days supply | Qty: 30 | Fill #2

## 2017-05-18 MED FILL — PROCHLORPERAZINE 10 MG TAB: 10 | 10 days supply | Qty: 30 | Fill #0

## 2017-05-21 ENCOUNTER — Inpatient Hospital Stay (HOSPITAL_BASED_OUTPATIENT_CLINIC_OR_DEPARTMENT_OTHER): Payer: Medicare Other | Admitting: Nurse Practitioner

## 2017-05-21 ENCOUNTER — Telehealth: Payer: Self-pay | Admitting: Hematology

## 2017-05-21 ENCOUNTER — Encounter: Payer: Self-pay | Admitting: Nurse Practitioner

## 2017-05-21 ENCOUNTER — Inpatient Hospital Stay: Payer: Medicare Other

## 2017-05-21 ENCOUNTER — Inpatient Hospital Stay: Payer: Medicare Other | Attending: Hematology

## 2017-05-21 VITALS — BP 181/77 | HR 93 | Temp 97.6°F | Resp 20 | Ht 73.0 in | Wt 212.7 lb

## 2017-05-21 DIAGNOSIS — R635 Abnormal weight gain: Secondary | ICD-10-CM

## 2017-05-21 DIAGNOSIS — R530 Neoplastic (malignant) related fatigue: Secondary | ICD-10-CM

## 2017-05-21 DIAGNOSIS — F419 Anxiety disorder, unspecified: Secondary | ICD-10-CM | POA: Diagnosis not present

## 2017-05-21 DIAGNOSIS — G893 Neoplasm related pain (acute) (chronic): Secondary | ICD-10-CM

## 2017-05-21 DIAGNOSIS — Z7901 Long term (current) use of anticoagulants: Secondary | ICD-10-CM | POA: Insufficient documentation

## 2017-05-21 DIAGNOSIS — D509 Iron deficiency anemia, unspecified: Secondary | ICD-10-CM | POA: Insufficient documentation

## 2017-05-21 DIAGNOSIS — K5903 Drug induced constipation: Secondary | ICD-10-CM | POA: Diagnosis not present

## 2017-05-21 DIAGNOSIS — I10 Essential (primary) hypertension: Secondary | ICD-10-CM | POA: Insufficient documentation

## 2017-05-21 DIAGNOSIS — Z9221 Personal history of antineoplastic chemotherapy: Secondary | ICD-10-CM | POA: Diagnosis not present

## 2017-05-21 DIAGNOSIS — C220 Liver cell carcinoma: Secondary | ICD-10-CM

## 2017-05-21 DIAGNOSIS — F17210 Nicotine dependence, cigarettes, uncomplicated: Secondary | ICD-10-CM | POA: Insufficient documentation

## 2017-05-21 DIAGNOSIS — G4700 Insomnia, unspecified: Secondary | ICD-10-CM | POA: Diagnosis not present

## 2017-05-21 DIAGNOSIS — F329 Major depressive disorder, single episode, unspecified: Secondary | ICD-10-CM | POA: Insufficient documentation

## 2017-05-21 DIAGNOSIS — Z86718 Personal history of other venous thrombosis and embolism: Secondary | ICD-10-CM | POA: Diagnosis not present

## 2017-05-21 DIAGNOSIS — Z5112 Encounter for antineoplastic immunotherapy: Secondary | ICD-10-CM | POA: Insufficient documentation

## 2017-05-21 DIAGNOSIS — D500 Iron deficiency anemia secondary to blood loss (chronic): Secondary | ICD-10-CM

## 2017-05-21 LAB — CMP (CANCER CENTER ONLY)
ALBUMIN: 3.6 g/dL (ref 3.5–5.0)
ALK PHOS: 108 U/L (ref 40–150)
ALT: 12 U/L (ref 0–55)
ANION GAP: 4 (ref 3–11)
AST: 23 U/L (ref 5–34)
BILIRUBIN TOTAL: 0.2 mg/dL (ref 0.2–1.2)
BUN: 14 mg/dL (ref 7–26)
CALCIUM: 8.7 mg/dL (ref 8.4–10.4)
CO2: 23 mmol/L (ref 22–29)
CREATININE: 1.16 mg/dL (ref 0.70–1.30)
Chloride: 109 mmol/L (ref 98–109)
GFR, Est AFR Am: >60 mL/min (ref >60)
GFR, Estimated: >60 mL/min (ref >60)
GLUCOSE: 188 mg/dL — AB (ref 70–140)
Potassium: 3.8 mmol/L (ref 3.5–5.1)
Sodium: 136 mmol/L (ref 136–145)
TOTAL PROTEIN: 6.8 g/dL (ref 6.4–8.3)

## 2017-05-21 LAB — CBC WITH DIFFERENTIAL (CANCER CENTER ONLY)
BASOS ABS: 0 10*3/uL (ref 0.0–0.1)
BASOS PCT: 0 %
EOS ABS: 0.2 10*3/uL (ref 0.0–0.5)
EOS PCT: 3 %
HCT: 31.1 % — ABNORMAL LOW (ref 38.4–49.9)
Hemoglobin: 10.6 g/dL — ABNORMAL LOW (ref 13.0–17.1)
Lymphocytes Relative: 29 %
Lymphs Abs: 2.2 10*3/uL (ref 0.9–3.3)
MCH: 30 pg (ref 27.2–33.4)
MCHC: 34.1 g/dL (ref 32.0–36.0)
MCV: 88.1 fL (ref 79.3–98.0)
MONOS PCT: 8 %
Monocytes Absolute: 0.6 10*3/uL (ref 0.1–0.9)
Neutro Abs: 4.4 10*3/uL (ref 1.5–6.5)
Neutrophils Relative %: 60 %
PLATELETS: 118 10*3/uL — AB (ref 140–400)
RBC: 3.53 MIL/uL — ABNORMAL LOW (ref 4.20–5.82)
RDW: 14 % (ref 11.0–14.6)
WBC Count: 7.5 10*3/uL (ref 4.0–10.3)

## 2017-05-21 MED ORDER — SODIUM CHLORIDE 0.9 % IV SOLN
Freq: Once | INTRAVENOUS | Status: AC
  Administered 2017-05-21: 13:00:00 via INTRAVENOUS

## 2017-05-21 MED ORDER — SODIUM CHLORIDE 0.9% FLUSH
10.0000 mL | INTRAVENOUS | Status: DC | PRN
  Administered 2017-05-21: 10 mL
  Filled 2017-05-21: qty 10

## 2017-05-21 MED ORDER — OXYCODONE HCL ER 80 MG PO T12A: 160.0000 mg | EXTENDED_RELEASE_TABLET | Freq: Two times a day (BID) | ORAL | 0 refills | Status: DC

## 2017-05-21 MED ORDER — DULOXETINE HCL 30 MG PO CPEP: 30.0000 mg | ORAL_CAPSULE | Freq: Every day | ORAL | 3 refills | Status: DC

## 2017-05-21 MED ORDER — SODIUM CHLORIDE 0.9% FLUSH
10.0000 mL | Freq: Once | INTRAVENOUS | Status: AC
  Administered 2017-05-21: 10 mL
  Filled 2017-05-21: qty 10

## 2017-05-21 MED ORDER — HEPARIN SOD (PORK) LOCK FLUSH 100 UNIT/ML IV SOLN
500.0000 [IU] | Freq: Once | INTRAVENOUS | Status: AC | PRN
  Administered 2017-05-21: 500 [IU]
  Filled 2017-05-21: qty 5

## 2017-05-21 MED ORDER — SODIUM CHLORIDE 0.9 % IV SOLN
240.0000 mg | Freq: Once | INTRAVENOUS | Status: AC
  Administered 2017-05-21: 240 mg via INTRAVENOUS
  Filled 2017-05-21: qty 24

## 2017-05-21 MED ORDER — ALPRAZOLAM 1 MG PO TABS
1.0000 mg | ORAL_TABLET | Freq: Four times a day (QID) | ORAL | 0 refills | Status: DC | PRN
Start: 2017-05-21 — End: 2017-06-21

## 2017-05-21 MED FILL — ALPRAZolam 1 MG TABS: 1 | 30 days supply | Qty: 120 | Fill #0

## 2017-05-21 MED FILL — OxyCONTIN 80 MG T12A: 80 | 30 days supply | Qty: 120 | Fill #0

## 2017-05-21 MED FILL — DULoxetine HCL 30 MG CPEP: 30 | 30 days supply | Qty: 30 | Fill #0

## 2017-05-21 NOTE — Progress Notes (Signed)
[place]  [phone] [phone]  Clinic Follow up Note   Patient Care Team: Antonietta Jewel, MD as PCP - General (Internal Medicine) [name], Cristopher Estimable, MD as Attending Physician (Gastroenterology) Antonietta Jewel, MD as Referring Physician (Internal Medicine) Truitt Merle, MD as Consulting Physician (Hematology) [name], CRNP as Nurse Practitioner (Nurse Practitioner) 05/21/2017  SUMMARY OF ONCOLOGIC HISTORY: Oncology History   Hepatocellular carcinoma [facility])   Staging form: Liver (Excluding Intrahepatic Bile Ducts), AJCC 7th Edition   - Clinical stage from 04/16/2012: Stage II (T2(m), N0, M0) - Signed by Truitt Merle, MD on 10/12/2015   - Pathologic stage from 04/16/2013: Stage II (T2, N0, cM0) - Signed by Truitt Merle, MD on 10/12/2015      Hepatocellular carcinoma ([place])   09/14/2012 Tumor Marker    AFP 28.9      04/16/2013 Imaging    Abdominal MRI with and without contrast reviewed multifocal (4) liver lesions in both left and right lobes, most consistent with HCC, largest measuring 2.7 cm      04/16/2013 Initial Diagnosis    Hepatocellular carcinoma ([place])      04/28/2013 Procedure    Right TACE with lipiodol       06/15/2013 Procedure    Left TACE with [facility]       07/14/2013 Procedure    Right TACE with [facility]      09/05/2015 Imaging    CT abdomen with and without contrast showed a new 5.0 x 2.3 cm mass in the hepatic segment 8, invading and occluding the right anterior portal vein, most compatible with [place]. A portacaval node measuring 3.0 x 1.4 cm, previously 2.9 x 1.1 cm.      10/11/2015 Tumor Marker    AFP 5.3      11/11/2015 - 02/03/2016 Chemotherapy    sorafenib 266m bid started on 11/11/2015, increased to 4041mbid in 2 weeks, stopped due to poor tolerance and probable disease progression       11/14/2015 Imaging    CT CAP 11/14/2015 IMPRESSION: Chest Impression: 1. RIGHT upper lobe pulmonary nodule is indeterminate. No  comparison available. 2. Ground-glass opacity and peripheral nodules in the RIGHT middle lobe appear post infectious or inflammatory.  Abdomen / Pelvis Impression: 1. 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 Thrombus also likely within the LEFT and RIGHT portal vein. Difficult to distinguish tumor thrombus versus bland thrombus. 2. Individual lesions are difficult to define in the RIGHT hepatic lobe. There is overall impression of progression of disease in the RIGHT hepatic lobe with multiple ill-defined enhancing lesions. 3. Periportal and shotty retroperitoneal adenopathy similar to prior.      01/25/2016 Imaging    CT Abdomen Pelvis w/ Contrast IMPRESSION: 1. Findings consistent with multifocal liver carcinoma with extensive portal vein thrombosis, and subsequent development of porta hepatis venous collaterals. Thrombus in the central portal vein is no longer expansile, but still expands the more peripheral portal vein and the right and left portal veins. There are few right liver bile ducts there are now dilated, which suggests mild progression of liver disease. 2. There is mild splenomegaly consistent or venous hypertension, which has mildly increased from the prior CT. 3. There are new lung abnormalities with interstitial thickening and ill-defined peribronchovascular nodular opacities mostly at the right lung base with a smaller area noted along the medial left lower lobe. The etiology may be infectious. It may  be inflammatory, possibly related to chemotherapy. Neoplastic disease is also possible. 4. There is no other evidence of metastatic disease within the abdomen or pelvis. No acute findings within the abdomen or pelvis      01/25/2016 - 01/25/2016 Hospital Admission    Pt was seen in ED for worsening abdominal pain, treated with pain meds       02/03/2016 -  Chemotherapy     The patient had nivolumab (OPDIVO) 240 mg in sodium chloride 0.9 % 100 mL chemo infusion, 240 mg, Intravenous, Once, 24 of 24 cycles Dose modification: 480 mg (original dose 240 mg, Cycle 12, Reason: Provider Judgment, Comment: every 4 weeks ), 240 mg (original dose 240 mg, Cycle 16, Reason: Provider Judgment) Administration: 240 mg (02/04/2016), 240 mg (02/18/2016), 240 mg (03/05/2016), 240 mg (03/20/2016), 240 mg (04/02/2016), 240 mg (04/16/2016), 240 mg (05/08/2016), 240 mg (06/04/2016), 240 mg (06/25/2016), 240 mg (07/16/2016), 240 mg (08/06/2016), 480 mg (09/03/2016), 480 mg (10/08/2016), 480 mg (11/05/2016), 480 mg (12/03/2016), 240 mg (01/02/2017), 240 mg (01/15/2017), 240 mg (01/29/2017), 240 mg (02/12/2017), 240 mg (02/26/2017), 240 mg (03/12/2017), 240 mg (04/09/2017), 240 mg (04/30/2017), 240 mg (05/21/2017)  for chemotherapy treatment.       02/04/2016 -  Chemotherapy    The patient started on immunotherapy treatment nivolumab every 2 weeks; on 04/30/16 dose was reduced to 446m every 3 weeks       02/14/2016 Imaging    CT CAP w/ contrast 1. Slight interval increase in size of the mediastinal lymph nodes. 2. Persistent extensive lower lobe peribronchial thickening and patchy infiltrates. Possible aspiration pneumonia. 3. Persistent tree-in-bud appearance in the right middle lobe, likely chronic inflammation or atypical infection. 4. Improved CT appearance of the liver. Two small residual arterial phase enhancing lesions are identified. 5. Chronic portal vein thrombosis with cavernous transformation of portal venous collaterals. Esophageal varices are again noted. 6. Stable upper abdominal lymph nodes likely related to cirrhosis.       04/16/2016 Tumor Marker    AFP 4.1       04/17/2016 Imaging     CT CAP W PELVIS IMPRESSION: 1. Stable mild mediastinal lymphadenopathy. 2. Interval improvement in the bibasilar peribronchial thickening, airway impaction, and peribronchovascular nodularity.  Imaging features suggest marked improvement in bilateral low the atypical infection. Changes in the right middle lobe with more stable and may represent scarring from previous infectious/inflammatory etiology. 3. The small hypervascular lesions seen in the anterior left liver on the previous study are not evident today. There is some subtle, ill-defined hyperenhancement in the central left liver which is nonspecific. Attention to this area on followup imaging recommended. 4. Chronic portal vein inclusion with cavernous transformation in the porta hepatis and paraesophageal varices. 5. Stable appearance hepatoduodenal and retroperitoneal lymphadenopathy, likely chronic and related to liver disease.      04/17/2016 Imaging    CT CAP CONTRAST IMPRESSION: 1. Stable mild mediastinal lymphadenopathy. 2. Interval improvement in the bibasilar peribronchial thickening, airway impaction, and peribronchovascular nodularity. Imaging features suggest marked improvement in bilateral low the atypical infection. Changes in the right middle lobe with more stable and may represent scarring from previous infectious/inflammatory etiology. 3. The small hypervascular lesions seen in the anterior left liver on the previous study are not evident today. There is some subtle, ill-defined hyperenhancement in the central left liver which is nonspecific. Attention to this area on followup imaging recommended. 4. Chronic portal vein inclusion with cavernous transformation in the porta hepatis and paraesophageal varices. 5. Stable appearance  hepatoduodenal and retroperitoneal lymphadenopathy, likely chronic and related to liver disease.      05/21/2016 Imaging    MR Abdomen W WO Contrast IMPRESSION: 1. Limited motion degraded scan. Cirrhosis. No evidence of a liver mass within these limitations. 2. Mild splenomegaly. No ascites. Stable mild paraumbilical and gastroesophageal varices. 3. Stable nonspecific mild  retroperitoneal lymphadenopathy. 4. 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      06/04/2016 Tumor Marker    AFP 3.5       08/25/2016 Imaging    MR Abdomen w wo contrast  IMPRESSION: 1. Mild-to-moderate motion degradation, preferentially involving the pre and postcontrast dynamic images. On follow-up, given the extent of motion on this exam, multiphase CT may be preferred. 2. Given these limitations, no evidence of hepatocellular carcinoma. 3. Marked cirrhosis and portal venous hypertension. 4. Slight increase in abdominal adenopathy, which is most likely reactive. Recommend attention on follow-up. 5. Cholelithiasis.       11/02/2016 Procedure    Upper endoscopy and colonoscopy  Diagnosis 1. Stomach, biopsy - MILD CHRONIC GASTRITIS. - NEGATIVE FOR HELICOBACTER PYLORI. - NO INTESTINAL METAPLASIA, DYSPLASIA, OR MALIGNANCY. 2. Colon, polyp(s), opposite ileocecal valve - TUBULAR ADENOMA. - NO HIGH GRADE DYSPLASIA OR MALIGNANCY. 3. Rectum, polyp(s) - HYPERPLASTIC POLYP. - NO DYSPLASIA OR MALIGNANCY.      01/08/2017 Imaging    CT Chest WO Contrast 01/08/17  IMPRESSION: 1. Stable chest CT. 2. Prominent to mildly enlarged mediastinal lymph nodes are stable. Based on stability, these are favored to be reactive. 3. Stable emphysema and right upper lobe nodularity. 4. Mild atherosclerosis.      01/08/2017 Imaging    MRI Abd W WO Contrast 01/08/17  IMPRESSION: 1. Cirrhotic liver now with severe iron deposition throughout the hepatic parenchyma, as well as areas of developing confluent hepatic fibrosis. Today's study clearly demonstrates 2 new hypervascular nodules in segment 4A of the liver, which demonstrate washout but no definite pseudocapsule, the largest which measures 1.5 cm. At this time, these are considered suspicious for hepatocellular carcinoma, bladder classified as Li-RADS 3 lesions. 2. Occlusion of the portal  vein with cavernous transformation in the porta hepatis. 3. Cholelithiasis without evidence of acute cholecystitis at this time. 4. Mild splenomegaly.      03/08/2017 Imaging    MRI Abdomen W WO Contrast 03/08/17 IMPRESSION: 1. 15 mm hypervascular lesion identified on the previous study is stable in the interval measuring 14 mm today. A lesion of this size demonstrating arterial phase hypervascularity and washout is consistent with LI-RADS 4, suggestive of but not diagnostic for hepatoma. Follow-up MRI in 3 months may be warranted. 2. 9 mm hypervascular lesion identified in segment IV on the previous study is not identified today. Attention on follow-up is recommended. 3. 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 4. Cholelithiasis.     CURRENT THERAPY:  Nivolumab 277m every 2 weeks, started on 02/04/2016; on 04/30/16 switched to every 3 weeks due to fatigue; change from 2433mto 48055mvery 4 weeks starting 09/03/2016. Given his poor toleration, will reduce him to every 2 weeks at 240m75marting 01/02/17. Changed to every 3 weeks on 04/09/17   INTERVAL HISTORY: Kevin Dillon for follow up as scheduled prior to next cycle nivolumab. He feels less fatigued with last cycle, he had more energy for activities this last week. Appetite is increased with megace, he supplements with 1-2 boost per day.  He started cymbalta after last visit, took for 1 week then developed nausea so he stopped. He did not notice much difference in his pain while taking it. Continues to report intermittent stabbing RUQ pain, well-controlled with current regimen. He takes oxycontin 2 tabs BID and 1-2 oxycodone per day. Requesting refill of long acting and xanax. He had constipation lately that resolved with dulcolax. Takes lasix rarely for leg swelling, usually 1 weekly or every other week. His abdomen feels swollen but stable over  few months.   REVIEW OF SYSTEMS:   Constitutional: Denies fevers, chills or abnormal weight loss (+) fatigue, slightly improved (+) appetite improved with megace  Eyes: Denies blurriness of vision Ears, nose, mouth, throat, and face: Denies mucositis or sore throat Respiratory: Denies cough, dyspnea or wheezes Cardiovascular: Denies palpitation, chest discomfort (+) fluctuant lower extremity swelling Gastrointestinal:  Denies vomiting, diarrhea, heartburn or change in bowel habits (+) nausea with cymbalta (+) intermittent stabbing RUQ pain (+) periodic constipation, resolves with dulcolax  Skin: Denies abnormal skin rashes Lymphatics: Denies new lymphadenopathy or easy bruising Neurological:Denies numbness, tingling or new weaknesses Behavioral/Psych: Mood is stable, no new changes  MSK: (+) periodic back pain  All other systems were reviewed with the patient and are negative.  MEDICAL HISTORY:  Past Medical History:  Diagnosis Date  . Cancer ([place])    liver  . Cholelithiasis   . Chronic hepatitis C ([place])   . Chronic knee pain   . Chronic left shoulder pain   . CVA (cerebral infarction)   . Gout   . Hepatitis C    genotype 1b.  pt has been vaccinated against hep A and B.  . Hypertension   . Osteoarthritis   . Schatzki's ring   . Tubular adenoma of colon 12/2011    SURGICAL HISTORY: Past Surgical History:  Procedure Laterality Date  . AMPUTATION Left 09/17/2012   Procedure: SMALL FINGER EXTENSOR TENDON REPAIR; METACARPAL LEVEL AMPUTATION RING FINGER; PROXIMAL PHALANX LEVEL AMPUTATION LONG FINGER;  Surgeon: Schuyler Amor, MD;  Location: [facility];  Service: Orthopedics;  Laterality: Left;  . Arm surgery     right/plate in arm  . BIOPSY  11/02/2016   Procedure: BIOPSY;  Surgeon: Daneil Dolin, MD;  [facility];  Service: Endoscopy;;  gastric   . COLONOSCOPY WITH ESOPHAGOGASTRODUODENOSCOPY (EGD)  12/30/2011   RMR: Noncritical Schatzki's ring;  Hiatal hernia,  Tubular ADENOMA removed from splenic flexure, otherwise normal colonoscopy  . COLONOSCOPY WITH PROPOFOL N/A 11/02/2016   Procedure: COLONOSCOPY WITH PROPOFOL;  Surgeon: Daneil Dolin, MD;  [facility];  Service: Endoscopy;  Laterality: N/A;  1045  . ESOPHAGOGASTRODUODENOSCOPY (EGD) WITH PROPOFOL N/A 11/02/2016   Procedure: ESOPHAGOGASTRODUODENOSCOPY (EGD) WITH PROPOFOL;  Surgeon: Daneil Dolin, MD;  [facility];  Service: Endoscopy;  Laterality: N/A;  . IR FLUORO GUIDE PORT INSERTION RIGHT  10/21/2016  . IR US GUIDE VASC ACCESS RIGHT  10/21/2016  . LEG SURGERY     left  . POLYPECTOMY  11/02/2016   Procedure: POLYPECTOMY;  Surgeon: Daneil Dolin, MD;  [facility];  Service: Endoscopy;;  colon  . WOUND EXPLORATION Left 09/17/2012   Procedure: WOUND EXPLORATION;  Surgeon: Schuyler Amor, MD;  Location: [facility];  Service: Orthopedics;  Laterality: Left;    I have reviewed the social history and family history with the patient and they are unchanged from previous note.  ALLERGIES:  has No Known Allergies.  MEDICATIONS:  Current  Outpatient Medications  Medication Sig Dispense Refill  . ALPRAZolam (XANAX) 1 MG tablet Take 1 tablet (1 mg total) by mouth every 6 (six) hours as needed for anxiety. 120 tablet 0  . megestrol (MEGACE) 40 MG/ML suspension Take 400 mg by mouth daily.     . Multiple Vitamins-Minerals (CENTRUM SILVER PO) Take 1 tablet by mouth daily.     Marland Kitchen oxyCODONE (OXYCONTIN) 80 mg 12 hr tablet Take 2 tablets (160 mg total) by mouth every 12 (twelve) hours. 120 tablet 0  . Oxycodone HCl 20 MG TABS TAKE 1 TABLET BY MOUTH EVERY 4 HOURS AS NEEDED. 120 tablet 0  . prochlorperazine (COMPAZINE) 10 MG tablet TAKE 1 TABLET BY MOUTH EVERY 8 HOURS AS NEEDED FOR NAUSEA/VOMITING 30 tablet 1  . rivaroxaban (XARELTO) 20 MG TABS tablet TAKE 1 TABLET BY MOUTH DAILY WITH SUPPER. 30 tablet 3  . cyclobenzaprine (FLEXERIL) 5 MG tablet Take 1 tablet (5 mg total) by  mouth 3 (three) times daily as needed for muscle spasms. 30 tablet 0  . DULoxetine (CYMBALTA) 30 MG capsule Take 1 capsule (30 mg total) by mouth daily. 30 capsule 3  . hydrALAZINE (APRESOLINE) 25 MG tablet Take 75 mg by mouth daily.     Marland Kitchen lactulose (CHRONULAC) 10 GM/15ML solution Take 30 g by mouth daily as needed for moderate constipation.     . lidocaine-prilocaine (EMLA) cream Apply 1 application topically as needed. Apply to portacath 1 1/2 hours - 2 hours prior to procedures as needed. 30 g 1   No current facility-administered medications for this visit.    Facility-Administered Medications Ordered in Other Visits  Medication Dose Route Frequency Provider Last Rate Last Dose  . sodium chloride flush (NS) 0.9 % injection 10 mL  10 mL Intracatheter PRN Truitt Merle, MD   10 mL at 05/21/17 1400    PHYSICAL EXAMINATION: ECOG PERFORMANCE STATUS: 2 - Symptomatic, <50% confined to bed  Vitals:   05/21/17 1133  BP: (!) 181/77  Pulse: 93  Resp: 20  Temp: 97.6 F (36.4 C)  SpO2: 96%   Filed Weights   05/21/17 1133  Weight: 212 lb 11.2 oz (96.5 kg)    GENERAL:alert, no distress and comfortable SKIN: skin color, texture, turgor are normal, no rashes or significant lesions EYES: normal, Conjunctiva are pink and non-injected, sclera clear OROPHARYNX:no exudate, no erythema and lips, buccal mucosa, and tongue normal  NECK: supple, thyroid normal size, non-tender, without nodularity LYMPH:  no palpable lymphadenopathy in the cervical, axillary or inguinal LUNGS: clear to auscultation and percussion with normal breathing effort HEART: regular rate & rhythm and no murmurs and no lower extremity edema ABDOMEN:abdomen firm, distended, normal bowel sounds (+) mild tenderness to RUQ  Musculoskeletal:no cyanosis of digits and no clubbing  NEURO: alert & oriented x 3 with fluent speech, no focal motor/sensory deficits PAC without erythema   LABORATORY DATA:  I have reviewed the data as  listed CBC Latest Ref Rng & Units 05/21/2017 04/30/2017 04/09/2017  WBC 4.0 - 10.3 K/uL 7.5 7.5 7.0  Hemoglobin 13.0 - 17.1 g/dL 10.6(L) 11.2(L) 11.2(L)  Hematocrit 38.4 - 49.9 % 31.1(L) 32.3(L) 33.1(L)  Platelets 140 - 400 K/uL 118(L) 142 137(L)     CMP Latest Ref Rng & Units 05/21/2017 04/30/2017 04/09/2017  Glucose 70 - 140 mg/dL 188(H) 121 122  BUN 7 - 26 mg/dL _0 Creatinine 0.70 - 1.30 mg/dL 1.16 1.36(H) 1.26  Sodium 136 - 145 mmol/L 136 139 139  Potassium  3.5 - 5.1 mmol/L 3.8 4.0 3.9  Chloride 98 - 109 mmol/L 109 107 107  CO2 22 - 29 mmol/L _0 Calcium 8.4 - 10.4 mg/dL 8.7 9.1 9.1  Total Protein 6.4 - 8.3 g/dL 6.8 7.1 7.2  Total Bilirubin 0.2 - 1.2 mg/dL 0.2 0.3 0.4  Alkaline Phos 40 - 150 U/L 108 106 106  AST 5 - 34 U/L _1 ALT 0 - 55 U/L _2 AFP:  09/14/2012: 28.9 10/11/2015: 5.3 11/01/2015: 3.7 12/02/2015: 3.7 01/22/2016: 3.4 02/18/2016: 7.1 03/17/16: 5.8 04/16/16: 4.1 06/04/2016: 3.5 07/16/2016:4.4 08/27/2016: 5.6 10/08/16: 4.3 11/05/16: 4.8 12/03/16: 5.4 01/01/17: 5.1 01/29/17: 4.5 02/26/17: 5.5 04/09/17: 4.0  PATHOLOGY   Diagnosis 11/02/16  1. Stomach, biopsy - MILD CHRONIC GASTRITIS. - NEGATIVE FOR HELICOBACTER PYLORI. - NO INTESTINAL METAPLASIA, DYSPLASIA, OR MALIGNANCY. 2. Colon, polyp(s), opposite ileocecal valve - TUBULAR ADENOMA. - NO HIGH GRADE DYSPLASIA OR MALIGNANCY. 3. Rectum, polyp(s) - HYPERPLASTIC POLYP. - NO DYSPLASIA OR MALIGNANCY. Microscopic Comment 1. A Warthin-Starry stain is performed to determine the possibility of the presence of Helicobacter pylori. The Warthin-Starry stain is negative for organisms of Helicobacter pylori.   RADIOGRAPHIC STUDIES: I have personally reviewed the radiological images as listed and agreed with the findings in the report. No results found.   ASSESSMENT & PLAN: 67 y.o. Caucasian male with past medical history of successfully treated hepatitis C, liver cirrhosis, history of ascites  and encephalopathy, recurrent HCC   1. Recurrent HCC, initially stage II 2. Abdominal pain, low back pain, and constipation 3. Portal vein thrombosis 4. 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 Hep C, successfully treated  6. Anxiety and insomnia 7. Fatigue and weight gain  8. HTN 9. Smoking 10. Depression 11. Iron deficiency anemia 12. Goal of care discussion, patient agrees with DNR/DNI  Mr. Hoos appears stable. He completed cycle 23 Nivolumab on 04/30/17 on a 3-week schedule. He tolerated better with improved fatigue. CBC and CMP reviewed, stable and adequate for treatment. Will obtain restaging CT before next cycle, ordered today. F/u in 3 weeks with Dr. Burr Dillon for scan results and next cycle.   For his chronic pain, I discussed de-escalating narcotics and he became anxious. He has met with Edwyna Shell, SW but has not made appointment with mental health provider. I will reach out to her today to help arrange that. He did not tolerate full dose cymbalta well; I'll reduce dose to 30 mg daily. Hopefully when this takes effect will be able to reduce dose of oxycontin, I plan to change from 180 mg daily to 160 mg daily at next visit. The patient is reluctant but understands recommendations. I cautioned him against using xanax and oxycodone together.   PLAN -Labs reviewed, proceed with cycle 24 nivolumab today, continue q3 weeks -Restaging CT CAP prior to next cycle, ordered today -F/u in 3 weeks with CT results and next cycle -Refilled: oxycontin, xanax -Changed Rx: cymbalta 30 mg daily     Orders Placed This Encounter  Procedures  . CT Abdomen Pelvis W Contrast    Standing Status:   Future    Standing Expiration Date:   05/21/2018    Order Specific Question:   If indicated for the ordered procedure, I authorize the administration of contrast media per Radiology protocol    Answer:   Yes    Order Specific Question:   Preferred  imaging location?    Answer:  [facility]    Order Specific Question:   Is Oral Contrast requested for this exam?    Answer:   Yes, Per Radiology protocol    Order Specific Question:   Radiology Contrast Protocol - do NOT remove file path    Answer:   [email or web address]    Order Specific Question:   Reason for Exam additional comments    Answer:   restaging scan for hepatocellular carcinoma on nivolumab  . CT Chest W Contrast    Standing Status:   Future    Standing Expiration Date:   05/21/2018    Order Specific Question:   If indicated for the ordered procedure, I authorize the administration of contrast media per Radiology protocol    Answer:   Yes    Order Specific Question:   Preferred imaging location?    Answer:   [facility]    Order Specific Question:   Radiology Contrast Protocol - do NOT remove file path    Answer:   [email or web address]    Order Specific Question:   Reason for Exam additional comments    Answer:   restaging scan for hepatocellular carcinoma on nivolumab   All questions were answered. The patient knows to call the clinic with any problems, questions or concerns. No barriers to learning was detected. I spent 20 minutes counseling the patient face to face. The total time spent in the appointment was 25 minutes and more than 50% was on counseling and review of test results     Alla Feeling, NP 05/21/17

## 2017-05-21 NOTE — Patient Instructions (Signed)
[facility] Discharge Instructions for Patients Receiving Chemotherapy  Today you received the following chemotherapy agents :  Opdivo.  To help prevent nausea and vomiting after your treatment, we encourage you to take your nausea medication as prescribed.   If you develop nausea and vomiting that is not controlled by your nausea medication, call the clinic.   BELOW ARE SYMPTOMS THAT SHOULD BE REPORTED IMMEDIATELY:  *FEVER GREATER THAN 100.5 F  *CHILLS WITH OR WITHOUT FEVER  NAUSEA AND VOMITING THAT IS NOT CONTROLLED WITH YOUR NAUSEA MEDICATION  *UNUSUAL SHORTNESS OF BREATH  *UNUSUAL BRUISING OR BLEEDING  TENDERNESS IN MOUTH AND THROAT WITH OR WITHOUT PRESENCE OF ULCERS  *URINARY PROBLEMS  *BOWEL PROBLEMS  UNUSUAL RASH Items with * indicate a potential emergency and should be followed up as soon as possible.  Feel free to call the clinic should you have any questions or concerns. The clinic phone number is [phone].  Please show the CHEMO ALERT CARD at check-in to the Emergency Department and triage nurse.   

## 2017-05-21 NOTE — Telephone Encounter (Signed)
Scheduled appt per 5/3 los - Gave patient avs and calender per los.

## 2017-05-24 ENCOUNTER — Other Ambulatory Visit: Payer: Self-pay | Admitting: Nurse Practitioner

## 2017-05-24 DIAGNOSIS — C220 Liver cell carcinoma: Secondary | ICD-10-CM

## 2017-05-25 ENCOUNTER — Other Ambulatory Visit: Payer: Self-pay | Admitting: Hematology

## 2017-05-25 ENCOUNTER — Telehealth: Payer: Self-pay

## 2017-05-25 NOTE — Telephone Encounter (Signed)
I refilled long acting on 05/21/17. He is not due for another refill yet. Do not refill this.

## 2017-05-25 NOTE — Telephone Encounter (Signed)
Advised pt of CT CAP on 5/14@ 2:00 pt is aware to arrive 15 min prior to appt. Advised to be NPO 4 hrs prior and to drink the 1st bottle of contrast @ 12pm and the 2nd @ 1pm. Pt verbalized understanding.

## 2017-05-25 NOTE — Telephone Encounter (Addendum)
Left voicemail msg. To call back in regards to note below. CT scan CAP was set for 5/14 @ 2:00pm. Per instructions pt should be NPO 4 hrs. Prior and is to drink the 1st bottle at 1:00pm and the 2nd bottle @ 2:00pm  ----- Message from Alla Feeling, NP sent at 05/25/2017  1:32 PM EDT ----- Please help me to schedule CT CAP before 5/24. Scan is authorized.  Thanks, Regan Rakers

## 2017-05-25 NOTE — Telephone Encounter (Signed)
Please refill oxycodone 20. Looks like something came in through [facility] from [place] too.

## 2017-05-26 ENCOUNTER — Telehealth: Payer: Self-pay

## 2017-05-26 NOTE — Telephone Encounter (Signed)
Patient called requesting to speak to Dr. Burr Medico about his medication.  Left voice message attempting to get clarification of specific concerns.

## 2017-05-27 ENCOUNTER — Telehealth: Payer: Self-pay

## 2017-05-27 NOTE — Telephone Encounter (Signed)
Spoke with pt caregiver in regards to Cymbalta. Per caregiver pt stopped taking medication today because it was causing diarrhea and nausea. I will advise Cira Rue NP

## 2017-06-01 ENCOUNTER — Ambulatory Visit (HOSPITAL_COMMUNITY)
Admission: RE | Admit: 2017-06-01 | Discharge: 2017-06-01 | Disposition: A | Discharge status: Home / Self Care | Payer: Medicare Other | Source: Ambulatory Visit | Attending: Nurse Practitioner | Admitting: Nurse Practitioner

## 2017-06-01 ENCOUNTER — Telehealth: Payer: Self-pay | Admitting: Hematology

## 2017-06-01 ENCOUNTER — Other Ambulatory Visit: Payer: Self-pay | Admitting: Hematology

## 2017-06-01 ENCOUNTER — Encounter (HOSPITAL_COMMUNITY): Payer: Self-pay

## 2017-06-01 ENCOUNTER — Telehealth: Payer: Self-pay

## 2017-06-01 DIAGNOSIS — J439 Emphysema, unspecified: Secondary | ICD-10-CM | POA: Diagnosis not present

## 2017-06-01 DIAGNOSIS — K8020 Calculus of gallbladder without cholecystitis without obstruction: Secondary | ICD-10-CM | POA: Diagnosis not present

## 2017-06-01 DIAGNOSIS — C220 Liver cell carcinoma: Secondary | ICD-10-CM | POA: Diagnosis not present

## 2017-06-01 DIAGNOSIS — K766 Portal hypertension: Secondary | ICD-10-CM | POA: Diagnosis not present

## 2017-06-01 DIAGNOSIS — K7460 Unspecified cirrhosis of liver: Secondary | ICD-10-CM | POA: Insufficient documentation

## 2017-06-01 DIAGNOSIS — I700 Atherosclerosis of aorta: Secondary | ICD-10-CM | POA: Insufficient documentation

## 2017-06-01 MED ORDER — OXYCODONE HCL 20 MG PO TABS: 1.0000 | ORAL_TABLET | ORAL | 0 refills | Status: DC | PRN

## 2017-06-01 MED ORDER — HEPARIN SOD (PORK) LOCK FLUSH 100 UNIT/ML IV SOLN
INTRAVENOUS | Status: AC
  Filled 2017-06-01: qty 5

## 2017-06-01 MED ORDER — IOPAMIDOL (ISOVUE-300) INJECTION 61%
INTRAVENOUS | Status: AC
  Filled 2017-06-01: qty 100

## 2017-06-01 MED ORDER — IOPAMIDOL (ISOVUE-300) INJECTION 61%
100.0000 mL | Freq: Once | INTRAVENOUS | Status: AC | PRN
  Administered 2017-06-01: 100 mL via INTRAVENOUS

## 2017-06-01 MED ORDER — HEPARIN SOD (PORK) LOCK FLUSH 100 UNIT/ML IV SOLN
500.0000 [IU] | Freq: Once | INTRAVENOUS | Status: AC
  Administered 2017-06-01: 500 [IU] via INTRAVENOUS

## 2017-06-01 MED FILL — oxyCODONE HCL 20 MG TABS: 20 | 17 days supply | Qty: 100 | Fill #0

## 2017-06-01 NOTE — Telephone Encounter (Signed)
I called and spoke with Kevin Dillon. I refilled pt's oxycodone 20mg  #100 (decreased amount) today. I discussed Kevin Dillon's narcotics dependence and depression with Kevin Dillon also, she is aware, and will come in with [name] on 5/24 appointments.   Truitt Merle  06/01/2017

## 2017-06-01 NOTE — Telephone Encounter (Signed)
Patient's significant other past called [place] today, I returned her call, no answers and I left a message for her to call back.  I also tried the patient's phone number, no one answered.  Truitt Merle  06/01/2017

## 2017-06-01 NOTE — Telephone Encounter (Signed)
Carl Best is calling on behalf of this patient.  Requesting for Dr. Burr Medico to call at her convenience concerning patient's condition.  Her [phone]

## 2017-06-02 ENCOUNTER — Telehealth: Payer: Self-pay | Admitting: General Practice

## 2017-06-02 NOTE — Telephone Encounter (Signed)
[facility] CSW Progress Note  Call received from Carl Best, patient's caregiver.  Requests call from [place].  No answer, left generic VM requesting call back.  Edwyna Shell, LCSW Clinical Social Worker Phone:  [phone]

## 2017-06-03 ENCOUNTER — Telehealth: Payer: Self-pay | Admitting: General Practice

## 2017-06-03 NOTE — Telephone Encounter (Signed)
[facility] CSW Progress Note  Returned patient's call re referral for counseling - patient has left message for therapist and has not heard back.  CSW will problem solve and work w patient to link to appropriate resources.  Edwyna Shell, LCSW Clinical Social Worker Phone:  [phone]

## 2017-06-04 ENCOUNTER — Encounter: Payer: Self-pay | Admitting: General Practice

## 2017-06-04 NOTE — Progress Notes (Signed)
[facility] CSW Progress Note  Patient referred to Laurence Ferrari, LCSW w [facility] [phone] [facility] for scheduling initial appointment).  Provider will call patient to schedule at mutually convenient time.  Spoke w patient who is awaiting their call.  Asked patient to check back w me next week in order to ensure secure transition to this providers care for behavioral health care needs.  Edwyna Shell, LCSW Clinical Social Worker Phone:  [phone]

## 2017-06-09 ENCOUNTER — Other Ambulatory Visit: Payer: Self-pay | Admitting: Hematology

## 2017-06-10 NOTE — Progress Notes (Signed)
[place]  [phone] [phone]  Clinic Follow up Note   Patient Care Team: Antonietta Jewel, MD as PCP - General (Internal Medicine) [name], Cristopher Estimable, MD as Attending Physician (Gastroenterology) Antonietta Jewel, MD as Referring Physician (Internal Medicine) Truitt Merle, MD as Consulting Physician (Hematology) [name], CRNP as Nurse Practitioner (Nurse Practitioner) Lynnda Shields, Frances Nickels, DO as Referring Physician (Osteopathic Medicine)   Date of Service:  06/11/2017   CHIEF COMPLAINTS:  Follow up recurrent [place]   Oncology History   Hepatocellular carcinoma [facility])   Staging form: Liver (Excluding Intrahepatic Bile Ducts), AJCC 7th Edition   - Clinical stage from 04/16/2012: Stage II (T2(m), N0, M0) - Signed by Truitt Merle, MD on 10/12/2015   - Pathologic stage from 04/16/2013: Stage II (T2, N0, cM0) - Signed by Truitt Merle, MD on 10/12/2015      Hepatocellular carcinoma ([place])   09/14/2012 Tumor Marker    AFP 28.9      04/16/2013 Imaging    Abdominal MRI with and without contrast reviewed multifocal (4) liver lesions in both left and right lobes, most consistent with [place], largest measuring 2.7 cm      04/16/2013 Initial Diagnosis    Hepatocellular carcinoma ([place])      04/28/2013 Procedure    Right TACE with lipiodol       06/15/2013 Procedure    Left [facility] with [facility]       07/14/2013 Procedure    Right TACE with [facility]      09/05/2015 Imaging    CT abdomen with and without contrast showed a new 5.0 x 2.3 cm mass in the hepatic segment 8, invading and occluding the right anterior portal vein, most compatible with [place]. A portacaval node measuring 3.0 x 1.4 cm, previously 2.9 x 1.1 cm.      10/11/2015 Tumor Marker    AFP 5.3      11/11/2015 - 02/03/2016 Chemotherapy    sorafenib 200mg  bid started on 11/11/2015, increased to 400mg  bid in 2 weeks, stopped due to poor tolerance and probable disease progression       11/14/2015 Imaging    CT CAP  11/14/2015 IMPRESSION: Chest Impression: 1. RIGHT upper lobe pulmonary nodule is indeterminate. No comparison available. 2. Ground-glass opacity and peripheral nodules in the RIGHT middle lobe appear post infectious or inflammatory.  Abdomen / Pelvis Impression: 1. 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 Thrombus also likely within the LEFT and RIGHT portal vein. Difficult to distinguish tumor thrombus versus bland thrombus. 2. Individual lesions are difficult to define in the RIGHT hepatic lobe. There is overall impression of progression of disease in the RIGHT hepatic lobe with multiple ill-defined enhancing lesions. 3. Periportal and shotty retroperitoneal adenopathy similar to prior.      01/25/2016 Imaging    CT Abdomen Pelvis w/ Contrast IMPRESSION: 1. Findings consistent with multifocal liver carcinoma with extensive portal vein thrombosis, and subsequent development of porta hepatis venous collaterals. Thrombus in the central portal vein is no longer expansile, but still expands the more peripheral portal vein and the right and left portal veins. There are few right liver bile ducts there are now dilated, which suggests mild progression of liver disease. 2. There is mild splenomegaly consistent or venous hypertension, which has mildly increased from the prior CT. 3. There are new lung abnormalities with interstitial thickening and ill-defined peribronchovascular nodular opacities mostly at the  right lung base with a smaller area noted along the medial left lower lobe. The etiology may be infectious. It may be inflammatory, possibly related to chemotherapy. Neoplastic disease is also possible. 4. There is no other evidence of metastatic disease within the abdomen or pelvis. No acute findings within the abdomen or pelvis      01/25/2016 - 01/25/2016 Hospital Admission    Pt was  seen in ED for worsening abdominal pain, treated with pain meds       02/03/2016 -  Chemotherapy    The patient had nivolumab (OPDIVO) 240 mg in sodium chloride 0.9 % 100 mL chemo infusion, 240 mg, Intravenous, Once, 24 of 28 cycles Dose modification: 480 mg (original dose 240 mg, Cycle 12, Reason: Provider Judgment, Comment: every 4 weeks ), 240 mg (original dose 240 mg, Cycle 16, Reason: Provider Judgment) Administration: 240 mg (02/04/2016), 240 mg (02/18/2016), 240 mg (03/05/2016), 240 mg (03/20/2016), 240 mg (04/02/2016), 240 mg (04/16/2016), 240 mg (05/08/2016), 240 mg (06/04/2016), 240 mg (06/25/2016), 240 mg (07/16/2016), 240 mg (08/06/2016), 480 mg (09/03/2016), 480 mg (10/08/2016), 480 mg (11/05/2016), 480 mg (12/03/2016), 240 mg (01/02/2017), 240 mg (01/15/2017), 240 mg (01/29/2017), 240 mg (02/12/2017), 240 mg (02/26/2017), 240 mg (03/12/2017), 240 mg (04/09/2017), 240 mg (04/30/2017), 240 mg (05/21/2017)  for chemotherapy treatment.       02/04/2016 -  Chemotherapy    The patient started on immunotherapy treatment nivolumab every 2 weeks; on 04/30/16 dose was reduced to 420mg  every 3 weeks       02/14/2016 Imaging    CT CAP w/ contrast 1. Slight interval increase in size of the mediastinal lymph nodes. 2. Persistent extensive lower lobe peribronchial thickening and patchy infiltrates. Possible aspiration pneumonia. 3. Persistent tree-in-bud appearance in the right middle lobe, likely chronic inflammation or atypical infection. 4. Improved CT appearance of the liver. Two small residual arterial phase enhancing lesions are identified. 5. Chronic portal vein thrombosis with cavernous transformation of portal venous collaterals. Esophageal varices are again noted. 6. Stable upper abdominal lymph nodes likely related to cirrhosis.       04/16/2016 Tumor Marker    AFP 4.1       04/17/2016 Imaging     CT CAP W PELVIS IMPRESSION: 1. Stable mild mediastinal lymphadenopathy. 2. Interval improvement in the  bibasilar peribronchial thickening, airway impaction, and peribronchovascular nodularity. Imaging features suggest marked improvement in bilateral low the atypical infection. Changes in the right middle lobe with more stable and may represent scarring from previous infectious/inflammatory etiology. 3. The small hypervascular lesions seen in the anterior left liver on the previous study are not evident today. There is some subtle, ill-defined hyperenhancement in the central left liver which is nonspecific. Attention to this area on followup imaging recommended. 4. Chronic portal vein inclusion with cavernous transformation in the porta hepatis and paraesophageal varices. 5. Stable appearance hepatoduodenal and retroperitoneal lymphadenopathy, likely chronic and related to liver disease.      04/17/2016 Imaging    CT CAP CONTRAST IMPRESSION: 1. Stable mild mediastinal lymphadenopathy. 2. Interval improvement in the bibasilar peribronchial thickening, airway impaction, and peribronchovascular nodularity. Imaging features suggest marked improvement in bilateral low the atypical infection. Changes in the right middle lobe with more stable and may represent scarring from previous infectious/inflammatory etiology. 3. The small hypervascular lesions seen in the anterior left liver on the previous study are not evident today. There is some subtle, ill-defined hyperenhancement in the central left liver which is nonspecific. Attention to this area on  followup imaging recommended. 4. Chronic portal vein inclusion with cavernous transformation in the porta hepatis and paraesophageal varices. 5. Stable appearance hepatoduodenal and retroperitoneal lymphadenopathy, likely chronic and related to liver disease.      05/21/2016 Imaging    MR Abdomen W WO Contrast IMPRESSION: 1. Limited motion degraded scan. Cirrhosis. No evidence of a liver mass within these limitations. 2. Mild splenomegaly. No  ascites. Stable mild paraumbilical and gastroesophageal varices. 3. Stable nonspecific mild retroperitoneal lymphadenopathy. 4. 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      06/04/2016 Tumor Marker    AFP 3.5       08/25/2016 Imaging    MR Abdomen w wo contrast  IMPRESSION: 1. Mild-to-moderate motion degradation, preferentially involving the pre and postcontrast dynamic images. On follow-up, given the extent of motion on this exam, multiphase CT may be preferred. 2. Given these limitations, no evidence of hepatocellular carcinoma. 3. Marked cirrhosis and portal venous hypertension. 4. Slight increase in abdominal adenopathy, which is most likely reactive. Recommend attention on follow-up. 5. Cholelithiasis.       11/02/2016 Procedure    Upper endoscopy and colonoscopy  Diagnosis 1. Stomach, biopsy - MILD CHRONIC GASTRITIS. - NEGATIVE FOR HELICOBACTER PYLORI. - NO INTESTINAL METAPLASIA, DYSPLASIA, OR MALIGNANCY. 2. Colon, polyp(s), opposite ileocecal valve - TUBULAR ADENOMA. - NO HIGH GRADE DYSPLASIA OR MALIGNANCY. 3. Rectum, polyp(s) - HYPERPLASTIC POLYP. - NO DYSPLASIA OR MALIGNANCY.      01/08/2017 Imaging    CT Chest WO Contrast 01/08/17  IMPRESSION: 1. Stable chest CT. 2. Prominent to mildly enlarged mediastinal lymph nodes are stable. Based on stability, these are favored to be reactive. 3. Stable emphysema and right upper lobe nodularity. 4. Mild atherosclerosis.      01/08/2017 Imaging    MRI Abd W WO Contrast 01/08/17  IMPRESSION: 1. Cirrhotic liver now with severe iron deposition throughout the hepatic parenchyma, as well as areas of developing confluent hepatic fibrosis. Today's study clearly demonstrates 2 new hypervascular nodules in segment 4A of the liver, which demonstrate washout but no definite pseudocapsule, the largest which measures 1.5 cm. At this time, these are considered suspicious for  hepatocellular carcinoma, bladder classified as Li-RADS 3 lesions. 2. 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 3. Cholelithiasis without evidence of acute cholecystitis at this time. 4. Mild splenomegaly.      03/08/2017 Imaging    MRI Abdomen W WO Contrast 03/08/17 IMPRESSION: 1. 15 mm hypervascular lesion identified on the previous study is stable in the interval measuring 14 mm today. A lesion of this size demonstrating arterial phase hypervascularity and washout is consistent with LI-RADS 4, suggestive of but not diagnostic for hepatoma. Follow-up MRI in 3 months may be warranted. 2. 9 mm hypervascular lesion identified in segment IV on the previous study is not identified today. Attention on follow-up is recommended. 3. 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 4. Cholelithiasis.      06/01/2017 Imaging    CT CAP w contrast IMPRESSION: 1. Stable size of the dominant arterial phase enhancing lesion in segment 4a of the liver, currently measuring 1.4 by 1.0 cm. There is a questionable second focus measuring 0.7 by 0.4 cm in segment 2 of the liver. Both merit surveillance. 2. A porta hepatis lymph node has enlarged compared to the prior MRI, previously 1.2 cm and currently 2.0 cm. Although potentially reactive, a  malignant lymph node could also demonstrate such size increase. This likewise merits careful surveillance. Otherwise is borderline adenopathy in the chest and abdomen. 3. Mild reduced nodularity associated with a right apical bandlike density, this appearance likewise merits surveillance. 4. Other imaging findings of potential clinical significance: Aortic Atherosclerosis (ICD10-I70.0). Emphysema (ICD10-J43.9). Airway thickening is present, suggesting bronchitis or reactive airways disease. Contracted gallbladder with small gallstones.  Cavernous transformation of the portal vein with recanalized umbilical vein indicating portal venous hypertension. Cirrhosis. Capsular calcifications along the glenohumeral and hip joints.       HISTORY OF PRESENTING ILLNESS (10/11/2015):  Kevin Dillon 67 y.o. male with past medical history of treated hepatitis C, liver cirrhosis, and hepatocellular carcinoma is here because of recurrent hepatocellular carcinoma. He is accompanied by his significant other [name] and her sister.  He was diagnosed with hepatocellular carcinoma in 2015, his initial image result is not available and staging is unknown. He was seen by interventional radiologist Dr. Delice Lesch at [facility] in [place] and underwent TACE procedure 3 times in 2015. He was subsequently followed, and recently repeated CT scan showed a new 5.0 cm mass in segment 8, with portal vein invasion. He was felt not to be a candidate for further liver targeted therapy, and was referred to [place] for further systemic therapy.  He complains about mid epigastric pain for the past 2 years, which significantly improved after his TACE procedure in 2015. It has been getting worse lately in the past 6 months. He states he gets about 7-8 out of 10, persistent, he has been taking oxycodone 20 mg every 6 hours, but his pain is not well controlled. He is quite fatigued, is low appetite. He last 40 lbs in the pat 4 months. He is able to function at home, in trying to remain to be physically active.  His hepatitis C was successfully treated. He has history of hepatitis C, complicated with ascites and encephalopathy, but it has been well controlled with medical management.   CURRENT THERAPY:  Nivolumab 240mg  every 2 weeks, started on 02/04/2016; on 04/30/16 switched to every 3 weeks due to fatigue; change from 240mg  to 480mg  every 4 weeks starting 09/03/2016. Given his poor toleration, will reduce him to every 2 weeks at 240mg  starting 01/02/17. Changed to every 3 weeks  on 04/09/17  INTERIM HISTORY:   Kevin Dillon returns for follow up and Nivolumab infusion. He presents to the clinic today with his wife today. He is doing well overall. He was recently referred by me to behavioral health at [place]. He has a consultation appointment with them on 06/17/2017. He notes that his is to help his opiates addition.   Labs today (06/11/17) of CBC, CMP, AFP tumor marker is as follows: all values are WNL except for CBC with RBC at 3.99, Hgb at 11.9. CMP with Cr at 1.4.   Since his last visit to the office, he underwent CT CAP w contrast on 06/01/2017 with results showing: IMPRESSION: Stable size of the dominant arterial phase enhancing lesion in segment 4a of the liver, currently measuring 1.4 by 1.0 cm. There is a questionable second focus measuring 0.7 by 0.4 cm in segment 2 of the liver. Both merit surveillance. A porta hepatis lymph node has enlarged compared to the prior MRI, previously 1.2 cm and currently 2.0 cm. Although potentially reactive, a malignant lymph node could also demonstrate such size increase. This likewise merits careful surveillance. Otherwise is borderline adenopathy in the chest and  abdomen. Mild reduced nodularity associated with a right apical bandlike density, this appearance likewise merits surveillance. Other imaging findings of potential clinical significance: Aortic Atherosclerosis (ICD10-I70.0). Emphysema (ICD10-J43.9). Airway thickening is present, suggesting bronchitis or reactive airways disease. Contracted gallbladder with small gallstones. Cavernous transformation of the portal vein with recanalized umbilical vein indicating portal venous hypertension. Cirrhosis. Capsular calcifications along the glenohumeral and hip joints.  On review of systems, he reports diarrhea x 1-2 episodes weekly. he denies any other symptoms. His pain is controlled. Pertinent positives are listed and detailed within the above HPI.    MEDICAL HISTORY:  Past  Medical History:  Diagnosis Date  . Cancer ([place])    liver  . Cholelithiasis   . Chronic hepatitis C ([place])   . Chronic knee pain   . Chronic left shoulder pain   . CVA (cerebral infarction)   . Gout   . Hepatitis C    genotype 1b.  pt has been vaccinated against hep A and B.  . Hypertension   . Osteoarthritis   . Schatzki's ring   . Tubular adenoma of colon 12/2011    SURGICAL HISTORY: Past Surgical History:  Procedure Laterality Date  . AMPUTATION Left 09/17/2012   Procedure: SMALL FINGER EXTENSOR TENDON REPAIR; METACARPAL LEVEL AMPUTATION RING FINGER; PROXIMAL PHALANX LEVEL AMPUTATION LONG FINGER;  Surgeon: Schuyler Amor, MD;  Location: [facility];  Service: Orthopedics;  Laterality: Left;  . Arm surgery     right/plate in arm  . BIOPSY  11/02/2016   Procedure: BIOPSY;  Surgeon: Daneil Dolin, MD;  [facility];  Service: Endoscopy;;  gastric   . COLONOSCOPY WITH ESOPHAGOGASTRODUODENOSCOPY (EGD)  12/30/2011   RMR: Noncritical Schatzki's ring;  Hiatal hernia, Tubular ADENOMA removed from splenic flexure, otherwise normal colonoscopy  . COLONOSCOPY WITH PROPOFOL N/A 11/02/2016   Procedure: COLONOSCOPY WITH PROPOFOL;  Surgeon: Daneil Dolin, MD;  [facility];  Service: Endoscopy;  Laterality: N/A;  1045  . ESOPHAGOGASTRODUODENOSCOPY (EGD) WITH PROPOFOL N/A 11/02/2016   Procedure: ESOPHAGOGASTRODUODENOSCOPY (EGD) WITH PROPOFOL;  Surgeon: Daneil Dolin, MD;  [facility];  Service: Endoscopy;  Laterality: N/A;  . IR FLUORO GUIDE PORT INSERTION RIGHT  10/21/2016  . IR US GUIDE VASC ACCESS RIGHT  10/21/2016  . LEG SURGERY     left  . POLYPECTOMY  11/02/2016   Procedure: POLYPECTOMY;  Surgeon: Daneil Dolin, MD;  [facility];  Service: Endoscopy;;  colon  . WOUND EXPLORATION Left 09/17/2012   Procedure: WOUND EXPLORATION;  Surgeon: Schuyler Amor, MD;  Location: [facility];  Service: Orthopedics;  Laterality: Left;    SOCIAL  HISTORY: Social History   Socioeconomic History  . Marital status: Significant Other    Spouse name: Not on file  . Number of children: 0  . Years of education: Not on file  . Highest education level: Not on file  Occupational History  . Occupation: disabled  Social Needs  . Financial resource strain: Not on file  . Food insecurity:    Worry: Not on file    Inability: Not on file  . Transportation needs:    Medical: Not on file    Non-medical: Not on file  Tobacco Use  . Smoking status: Former Smoker    Packs/day: 0.50    Years: 40.00    Pack years: 20.00    Types: Cigarettes    Last attempt to quit: 01/19/2012    Years since quitting: 5.3  .  Smokeless tobacco: Never Used  . Tobacco comment: Smokes 1/2 pack of cigarettes daily  Substance and Sexual Activity  . Alcohol use: No    Comment: HX 2 beers 3-4 days per week; QUIT DEC 2013  . Drug use: No    Comment: Hx cocaine yrs ago, Last marijuana OCT 2013  . Sexual activity: Not Currently    Partners: Female  Lifestyle  . Physical activity:    Days per week: Not on file    Minutes per session: Not on file  . Stress: Not on file  Relationships  . Social connections:    Talks on phone: Not on file    Gets together: Not on file    Attends religious service: Not on file    Active member of club or organization: Not on file    Attends meetings of clubs or organizations: Not on file    Relationship status: Not on file  . Intimate partner violence:    Fear of current or ex partner: Not on file    Emotionally abused: Not on file    Physically abused: Not on file    Forced sexual activity: Not on file  Other Topics Concern  . Not on file  Social History Narrative   Lives w/ significant other, [name]    FAMILY HISTORY: Family History  Problem Relation Age of Onset  . Cirrhosis Father 45  . Cancer Father        liver cancer   . Lung cancer Mother 49  . Colon cancer Neg Hx     ALLERGIES:  has No Known  Allergies.  MEDICATIONS:  Current Outpatient Medications  Medication Sig Dispense Refill  . ALPRAZolam (XANAX) 1 MG tablet Take 1 tablet (1 mg total) by mouth every 6 (six) hours as needed for anxiety. 120 tablet 0  . cyclobenzaprine (FLEXERIL) 5 MG tablet Take 1 tablet (5 mg total) by mouth 3 (three) times daily as needed for muscle spasms. 30 tablet 0  . DULoxetine (CYMBALTA) 30 MG capsule Take 1 capsule (30 mg total) by mouth daily. 30 capsule 3  . hydrALAZINE (APRESOLINE) 25 MG tablet Take 75 mg by mouth daily.     Marland Kitchen lactulose (CHRONULAC) 10 GM/15ML solution Take 30 g by mouth daily as needed for moderate constipation.     . lidocaine-prilocaine (EMLA) cream Apply 1 application topically as needed. Apply to portacath 1 1/2 hours - 2 hours prior to procedures as needed. 30 g 1  . megestrol (MEGACE) 40 MG/ML suspension Take 400 mg by mouth daily.     . Multiple Vitamins-Minerals (CENTRUM SILVER PO) Take 1 tablet by mouth daily.     Marland Kitchen oxyCODONE (OXYCONTIN) 80 mg 12 hr tablet Take 2 tablets (160 mg total) by mouth every 12 (twelve) hours. 60 tablet 0  . Oxycodone HCl 20 MG TABS Take 1 tablet (20 mg total) by mouth every 4 (four) hours as needed. 50 tablet 0  . prochlorperazine (COMPAZINE) 10 MG tablet TAKE 1 TABLET BY MOUTH EVERY 8 HOURS AS NEEDED FOR NAUSEA/VOMITING 30 tablet 1  . rivaroxaban (XARELTO) 20 MG TABS tablet TAKE 1 TABLET BY MOUTH DAILY WITH SUPPER. 30 tablet 3   No current facility-administered medications for this visit.     REVIEW OF SYSTEMS:  Constitutional: Denies fevers, chills or abnormal night sweats  (+) weight gain (+) fatigue Eyes: Denies blurriness of vision, double vision or watery eyes Ears, nose, mouth, throat, and face: Denies mucositis or sore throat (+)  seasonal allergies  Respiratory: Denies cough, dyspnea or wheezes Cardiovascular: Denies palpitation, chest discomfort or lower extremity swelling Gastrointestinal:  Denies nausea, heartburn or change in bowel  habits, (+) constipation  Skin: Denies abnormal skin rashes Lymphatics: Denies new lymphadenopathy or easy bruising Neurological:Denies numbness, tingling or new weaknesses Behavioral/Psych: normal Musculoskeletal: (+) back pain (+) lower extremity myalgia pain (+) pain controlled  All other systems were reviewed with the patient and are negative.  PHYSICAL EXAMINATION  ECOG PERFORMANCE STATUS: 2  Vitals:   06/11/17 1321  BP: (!) 177/74  Pulse: 87  Resp: 18  Temp: 98.1 F (36.7 C)  SpO2: 97%   Filed Weights   06/11/17 1321  Weight: 206 lb 14.4 oz (93.8 kg)     GENERAL:alert, no distress and comfortable SKIN: skin color, texture, turgor are normal, no rashes or significant lesions EYES: normal, conjunctiva are pink and non-injected, sclera clear OROPHARYNX:no exudate, no erythema and lips, buccal mucosa, and tongue normal  NECK: supple, thyroid normal size, non-tender, without nodularity LYMPH:  no palpable lymphadenopathy in the cervical, axillary or inguinal LUNGS: clear to auscultation and percussion with normal breathing effort HEART: regular rate & rhythm and no murmurs and no lower extremity edema ABDOMEN:(+) abdominal bloating, no tenderness Musculoskeletal:no cyanosis of digits and no clubbing  PSYCH: alert & oriented x 3 with fluent speech NEURO: no focal motor/sensory deficits  LABORATORY DATA:  I have reviewed the data as listed CBC Latest Ref Rng & Units 06/11/2017 05/21/2017 04/30/2017  WBC 4.0 - 10.3 K/uL 7.6 7.5 7.5  Hemoglobin 13.0 - 17.1 g/dL 11.9(L) 10.6(L) 11.2(L)  Hematocrit 38.4 - 49.9 % 34.8(L) 31.1(L) 32.3(L)  Platelets 140 - 400 K/uL 151 118(L) 142   CMP Latest Ref Rng & Units 06/11/2017 05/21/2017 04/30/2017  Glucose 70 - 140 mg/dL 107 188(H) 121  BUN 7 - 26 mg/dL 14 14 13   Creatinine 0.70 - 1.30 mg/dL 1.40(H) 1.16 1.36(H)  Sodium 136 - 145 mmol/L 140 136 139  Potassium 3.5 - 5.1 mmol/L 3.8 3.8 4.0  Chloride 98 - 109 mmol/L 107 109 107  CO2 22 - 29  mmol/L 22 23 25   Calcium 8.4 - 10.4 mg/dL 9.1 8.7 9.1  Total Protein 6.4 - 8.3 g/dL 7.6 6.8 7.1  Total Bilirubin 0.2 - 1.2 mg/dL 0.7 0.2 0.3  Alkaline Phos 40 - 150 U/L 145 108 106  AST 5 - 34 U/L 27 23 19   ALT 0 - 55 U/L 15 12 13     AFP:  09/14/2012: 28.9 10/11/2015: 5.3 11/01/2015: 3.7 12/02/2015: 3.7 01/22/2016: 3.4 02/18/2016: 7.1 03/17/16: 5.8 04/16/16: 4.1 06/04/2016: 3.5 07/16/2016:4.4 08/27/2016: 5.6 10/08/16: 4.3 11/05/16: 4.8 12/03/16: 5.4 01/01/17: 5.1 01/29/17: 4.5 02/26/17: 5.5 04/09/17: 4.0  PATHOLOGY   Diagnosis 11/02/16  1. Stomach, biopsy - MILD CHRONIC GASTRITIS. - NEGATIVE FOR HELICOBACTER PYLORI. - NO INTESTINAL METAPLASIA, DYSPLASIA, OR MALIGNANCY. 2. Colon, polyp(s), opposite ileocecal valve - TUBULAR ADENOMA. - NO HIGH GRADE DYSPLASIA OR MALIGNANCY. 3. Rectum, polyp(s) - HYPERPLASTIC POLYP. - NO DYSPLASIA OR MALIGNANCY. Microscopic Comment 1. A Warthin-Starry stain is performed to determine the possibility of the presence of Helicobacter pylori. The Warthin-Starry stain is negative for organisms of Helicobacter pylori.   PROCEDURE  Upper Endoscopy by Dr. [name] 11/02/16  IMPRESSION:  - mildly obstructing Schatzki ring. - dilated with scope passage. erosive reflux esophagitis - Small hiatal hernia. - Portal hypertensive gastropathy. biopsied - Normal duodenal bulb and second portion of the duodenum. Biopsied.   Colonoscopy by Dr. [name] 11/02/16  IMPRESSION:  -  Two 3 to 5 mm polyps in the rectum and at the ileocecal valve, removed with a cold snare. Resected and retrieved. - Diverticulosis in the sigmoid colon and in the descending colon. - The examination was otherwise normal on direct and retroflexion views.   RADIOGRAPHIC STUDIES: I have personally reviewed the radiological images as listed and agreed with the findings in the report.  Ct Chest W Contrast  Result Date: 06/02/2017 CLINICAL DATA:  Hepatocellular carcinoma on nivolumab,  restaging assessment. EXAM: CT CHEST, ABDOMEN, AND PELVIS WITH CONTRAST TECHNIQUE: Multidetector CT imaging of the chest, abdomen and pelvis was performed following the standard protocol during bolus administration of intravenous contrast. CONTRAST:  136mL ISOVUE-300 IOPAMIDOL (ISOVUE-300) INJECTION 61% COMPARISON:  Multiple exams, including liver MRI from 03/08/2017 and CT scan from 01/08/2017 FINDINGS: CT CHEST FINDINGS Cardiovascular: Port-A-Cath tip: Cavoatrial junction. Atherosclerotic calcification of the aortic arch and branch vessels. Mediastinum/Nodes: 0.8 cm right paratracheal lymph node, image 38/8, stable. A lower right paratracheal lymph node has a fatty hilum and measures 1.0 cm in short axis on image 44/8, likewise stable. Other upper normal sized lymph nodes are present in the mediastinum. A right hilar lymph node measures 1.0 cm in short axis on image 54/8, previously 1.1 cm. A lower paraesophageal lymph node measures 1.1 cm in short axis on image 91/8, formerly 1.0 cm. A subcarinal lymph node measures 1.4 cm in short axis on image 62/8, stable. Lungs/Pleura: Biapical pleuroparenchymal scarring. Paraseptal emphysema. Bandlike density posteriorly in the right lung apex with associated nodularity, dominant nodule 1.0 by 0.7 cm on image 48/12, formerly 1.2 by 0.8 cm. Subtle reticulonodular opacity medially in the right lower lobe at the lung base, similar to prior. Mild airway thickening noted. Musculoskeletal: Left trapezius lipoma similar to prior. Stable calcifications in the left glenohumeral joint. Thoracic spondylosis. CT ABDOMEN PELVIS FINDINGS Hepatobiliary: Hepatic cirrhosis. The previous arterial phase enhancing lesion in segment 4a of the liver measures 1.4 by 1.0 cm, formerly the same by my measurements. Questionable 0.7 by 0.4 cm arterial phase enhancing focus in segment 2 of the liver on image 17/3, not well seen on the prior exam. Contracted gallbladder noted with internal density  probably from gallstones. No biliary dilatation. Pancreas: Unremarkable Spleen: The spleen measures 10.1 by 5.6 by 13.6 cm. Adrenals/Urinary Tract: Unremarkable Stomach/Bowel: Unremarkable Vascular/Lymphatic: Cavernous transformation of the portal vein. Aortoiliac atherosclerotic vascular disease. Recanalized umbilical vein. Atheromatous narrowing of the proximal SMA. A dominant porta hepatis/peripancreatic lymph node measures 2.0 cm in diameter on image 121/8, and previously measured 1.2 cm. Scattered retroperitoneal lymph nodes include a 1.0 cm lymph node on image 137/8. A right gastric node measures 1.0 cm in short axis on image 101/8. Upper normal sized pelvic lymph nodes are also present. Reproductive: Unremarkable Other: No supplemental non-categorized findings. Musculoskeletal: Faint capsular calcification along both hips. IM nail in the left femur. IMPRESSION: 1. Stable size of the dominant arterial phase enhancing lesion in segment 4a of the liver, currently measuring 1.4 by 1.0 cm. There is a questionable second focus measuring 0.7 by 0.4 cm in segment 2 of the liver. Both merit surveillance. 2. A porta hepatis lymph node has enlarged compared to the prior MRI, previously 1.2 cm and currently 2.0 cm. Although potentially reactive, a malignant lymph node could also demonstrate such size increase. This likewise merits careful surveillance. Otherwise is borderline adenopathy in the chest and abdomen. 3. Mild reduced nodularity associated with a right apical bandlike density, this appearance likewise merits surveillance. 4. Other imaging  findings of potential clinical significance: Aortic Atherosclerosis (ICD10-I70.0). Emphysema (ICD10-J43.9). Airway thickening is present, suggesting bronchitis or reactive airways disease. Contracted gallbladder with small gallstones. Cavernous transformation of the portal vein with recanalized umbilical vein indicating portal venous hypertension. Cirrhosis. Capsular  calcifications along the glenohumeral and hip joints. Electronically Signed   By: Van Clines M.D.   On: 06/02/2017 10:02   Ct Abdomen Pelvis W Contrast  Result Date: 06/02/2017 CLINICAL DATA:  Hepatocellular carcinoma on nivolumab, restaging assessment. EXAM: CT CHEST, ABDOMEN, AND PELVIS WITH CONTRAST TECHNIQUE: Multidetector CT imaging of the chest, abdomen and pelvis was performed following the standard protocol during bolus administration of intravenous contrast. CONTRAST:  163mL ISOVUE-300 IOPAMIDOL (ISOVUE-300) INJECTION 61% COMPARISON:  Multiple exams, including liver MRI from 03/08/2017 and CT scan from 01/08/2017 FINDINGS: CT CHEST FINDINGS Cardiovascular: Port-A-Cath tip: Cavoatrial junction. Atherosclerotic calcification of the aortic arch and branch vessels. Mediastinum/Nodes: 0.8 cm right paratracheal lymph node, image 38/8, stable. A lower right paratracheal lymph node has a fatty hilum and measures 1.0 cm in short axis on image 44/8, likewise stable. Other upper normal sized lymph nodes are present in the mediastinum. A right hilar lymph node measures 1.0 cm in short axis on image 54/8, previously 1.1 cm. A lower paraesophageal lymph node measures 1.1 cm in short axis on image 91/8, formerly 1.0 cm. A subcarinal lymph node measures 1.4 cm in short axis on image 62/8, stable. Lungs/Pleura: Biapical pleuroparenchymal scarring. Paraseptal emphysema. Bandlike density posteriorly in the right lung apex with associated nodularity, dominant nodule 1.0 by 0.7 cm on image 48/12, formerly 1.2 by 0.8 cm. Subtle reticulonodular opacity medially in the right lower lobe at the lung base, similar to prior. Mild airway thickening noted. Musculoskeletal: Left trapezius lipoma similar to prior. Stable calcifications in the left glenohumeral joint. Thoracic spondylosis. CT ABDOMEN PELVIS FINDINGS Hepatobiliary: Hepatic cirrhosis. The previous arterial phase enhancing lesion in segment 4a of the liver  measures 1.4 by 1.0 cm, formerly the same by my measurements. Questionable 0.7 by 0.4 cm arterial phase enhancing focus in segment 2 of the liver on image 17/3, not well seen on the prior exam. Contracted gallbladder noted with internal density probably from gallstones. No biliary dilatation. Pancreas: Unremarkable Spleen: The spleen measures 10.1 by 5.6 by 13.6 cm. Adrenals/Urinary Tract: Unremarkable Stomach/Bowel: Unremarkable Vascular/Lymphatic: Cavernous transformation of the portal vein. Aortoiliac atherosclerotic vascular disease. Recanalized umbilical vein. Atheromatous narrowing of the proximal SMA. A dominant porta hepatis/peripancreatic lymph node measures 2.0 cm in diameter on image 121/8, and previously measured 1.2 cm. Scattered retroperitoneal lymph nodes include a 1.0 cm lymph node on image 137/8. A right gastric node measures 1.0 cm in short axis on image 101/8. Upper normal sized pelvic lymph nodes are also present. Reproductive: Unremarkable Other: No supplemental non-categorized findings. Musculoskeletal: Faint capsular calcification along both hips. IM nail in the left femur. IMPRESSION: 1. Stable size of the dominant arterial phase enhancing lesion in segment 4a of the liver, currently measuring 1.4 by 1.0 cm. There is a questionable second focus measuring 0.7 by 0.4 cm in segment 2 of the liver. Both merit surveillance. 2. A porta hepatis lymph node has enlarged compared to the prior MRI, previously 1.2 cm and currently 2.0 cm. Although potentially reactive, a malignant lymph node could also demonstrate such size increase. This likewise merits careful surveillance. Otherwise is borderline adenopathy in the chest and abdomen. 3. Mild reduced nodularity associated with a right apical bandlike density, this appearance likewise merits surveillance. 4. Other imaging findings  of potential clinical significance: Aortic Atherosclerosis (ICD10-I70.0). Emphysema (ICD10-J43.9). Airway thickening is  present, suggesting bronchitis or reactive airways disease. Contracted gallbladder with small gallstones. Cavernous transformation of the portal vein with recanalized umbilical vein indicating portal venous hypertension. Cirrhosis. Capsular calcifications along the glenohumeral and hip joints. Electronically Signed   By: Van Clines M.D.   On: 06/02/2017 10:02    04/17/16 CT CAP W PELVIS IMPRESSION: 1. Stable mild mediastinal lymphadenopathy. 2. Interval improvement in the bibasilar peribronchial thickening, airway impaction, and peribronchovascular nodularity. Imaging features suggest marked improvement in bilateral low the atypical infection. Changes in the right middle lobe with more stable and may represent scarring from previous infectious/inflammatory etiology. 3. The small hypervascular lesions seen in the anterior left liver on the previous study are not evident today. There is some subtle, ill-defined hyperenhancement in the central left liver which is nonspecific. Attention to this area on followup imaging recommended. 4. Chronic portal vein inclusion with cavernous transformation in the porta hepatis and paraesophageal varices. 5. Stable appearance hepatoduodenal and retroperitoneal lymphadenopathy, likely chronic and related to liver disease.   MR Abdomen W WO Contrast 05/21/16 IMPRESSION: 1. Limited motion degraded scan. Cirrhosis. No evidence of a liver mass within these limitations. 2. Mild splenomegaly. No ascites. Stable mild paraumbilical and gastroesophageal varices. 3. Stable nonspecific mild retroperitoneal lymphadenopathy. 4. 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  MR Abdomen w wo contrast 08/25/2016 IMPRESSION: 1. Mild-to-moderate motion degradation, preferentially involving the pre and postcontrast dynamic images. On follow-up, given the extent of motion on this exam, multiphase CT may be preferred. 2.  Given these limitations, no evidence of hepatocellular carcinoma. 3. Marked cirrhosis and portal venous hypertension. 4. Slight increase in abdominal adenopathy, which is most likely reactive. Recommend attention on follow-up. 5. Cholelithiasis.   CT Chest WO Contrast 01/08/17  IMPRESSION: 1. Stable chest CT. 2. Prominent to mildly enlarged mediastinal lymph nodes are stable. Based on stability, these are favored to be reactive. 3. Stable emphysema and right upper lobe nodularity. 4. Mild atherosclerosis.    MRI Abd W WO Contrast 01/08/17  IMPRESSION: 1. Cirrhotic liver now with severe iron deposition throughout the hepatic parenchyma, as well as areas of developing confluent hepatic fibrosis. Today's study clearly demonstrates 2 new hypervascular nodules in segment 4A of the liver, which demonstrate washout but no definite pseudocapsule, the largest which measures 1.5 cm. At this time, these are considered suspicious for hepatocellular carcinoma, bladder classified as Li-RADS 3 lesions. 2. 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 3. Cholelithiasis without evidence of acute cholecystitis at this time. 4. Mild splenomegaly.  MRI Abdomen W WO Contrast 03/08/17 IMPRESSION: 1. 15 mm hypervascular lesion identified on the previous study is stable in the interval measuring 14 mm today. A lesion of this size demonstrating arterial phase hypervascularity and washout is consistent with LI-RADS 4, suggestive of but not diagnostic for hepatoma. Follow-up MRI in 3 months may be warranted. 2. 9 mm hypervascular lesion identified in segment IV on the previous study is not identified today. Attention on follow-up is recommended. 3. 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 4. Cholelithiasis.   ASSESSMENT & PLAN: 67 y.o. Caucasian male with past medical  history of successfully treated hepatitis C, liver cirrhosis, history of ascites and encephalopathy, recurrent HCC   1. Recurrent HCC, initially stage II -I previously reviewed his previous image and outside  medical records extensively, confirmed key findings with patient and his family members -He initially had a multifocal disease, status post TACE 3 times in 2015 -he developed symptomatic local recurrence in 08/2015, with a 5cm new lesion in segment 8 with direct invasion into portal vein, and a portocaval lymph node. He is not a candidate for liver targeted therapy per his IR Dr. Delice Lesch -He was started on first line sorafenib, tolerated poorly. His previous CT abdomen and pelvis on 01/25/2016 revealed extensive portal hypertension, and a probable cancer progression in the liver. -His treatment has changed to immunotherapy Nivolumab on 02/04/16, he tolerated very well, and clinically he was doing much better, with less pain and better energy.  -I previously discussed his restaging CT scan which was done on 02/14/2016, it actually showed decreased liver cancer size compared to the scan a month ago. No other new metastasis. -restaging CT from 04/17/2016 showed no visible liver mass, but the imaging was very limited due to his liver cirrhosis  -I previously reviewed his recent abdominal MRI from 05/21/2016, which showed no visible mass in the liver. He has had complete radiographic response. -I reviewed his restaging MRI from 08/25/2016, which showed no visible liver mass, but the images quality with significant compromised due to the motion.  -Nivo has previously been changed to 480mg  every 4 weeks in 08/2016. Due to poor toleration (fatigue) I changed his nivo back to 240mg  every 2 weeks starting 01/02/17.  -We discussed his MRI abd and CT chest from 01/08/17 which showed no concern for metastatic disease in the chest. MRI shows two small new lesions in his liver. This was reviewed in tumor board, felt to be  indeterminate and close f/u with imaging was recommended -MRI Abdomen from 03/08/17 reveals that his 15 mm hypervascular lesion identified on the previous study is stable in the interval measuring 14 mm today, demonstrating arterial phase hypervascularity and washout is consistent with LI-RADS 4, suggestive of but not diagnostic for hepatoma, 9 mm hypervascular lesion identified in segment IV on the previous study is not identified today. I previously discussed results with pt. I recommended continue Nivolumab. -On 04/09/17, he requested to change treatment to every 3 weeks at 240 mg dose due to the fatigue.  He knows the standard is every 2 weeks, but I will accommodate his request. -Labs reviewed and overall stable and adequate to proceed with nivo today. -CT CAP w contrast on 06/01/2017 showed: Stable size of the dominant arterial phase enhancing lesion in segment 4a of the liver, currently measuring 1.4 by 1.0 cm. There is a questionable second focus measuring 0.7 by 0.4 cm in segment 2 of the liver. Both merit surveillance. A porta hepatis lymph node has enlarged compared to the prior MRI, previously 1.2 cm and currently 2.0 cm. Although potentially reactive, a malignant lymph node could also demonstrate such size increase. This likewise merits careful surveillance. Otherwise is borderline adenopathy in the chest and abdomen. Mild reduced nodularity associated with a right apical bandlike density, this appearance likewise merits surveillance. -Recent imaging scan reviewed with patient and his wife today.  -I discussed that I will review the recent findings with the tumor board on next week regarding if the patient will need changing his therapy, depends on how confident he has disease progression  -Due to the probably slow disease progression, I recommend pt to have nivolumab q 2 weeks instead of  q 3 weeks. He agrees.    2. Abdominal pain, low back pain and  Constipation  -His abdominal pain his much  improved since he started treatment -overall better now with oxycodone and OxyContin, he is on very high dose, overall pain is controlled  -I previously recommended him to see pain management clinic, he declined. - I previously prescribed flexeril in attempt to help with his pain. Due to his high tolerance, he can take up to 2 a day -The patient previously stated his pain was not well controlled, I increased his OxyContin from 80 mg every 8 hours to 160 mg every 12 hours, pain is better controlled now  -Advised the patient to remain active to aid with increased fatigue.  - I changed his dose of oxycodone from 30 mg to 20 mg on 02/12/17 he is doing well on this  -His constipation is likely related to his narcotic use, he uses colace and I advised him he can use Miralax or Synokot that is stronger.  -I previously had a lengthy conversation with pt today about reducing his narcotic pain medication and the risks associated with taking high dose (including shortening his life) for a long period of time. His cancer is well controlled currently and it should not cause him a lot of pain. I am concerned that he has narcotic addition, and his depression/anxiety may also contribute to his pain.  He is reluctant to reducing his dose. I offered Cymbalta today and he agreed to try. Our social worker Webb Silversmith will see him today and I previously recommended that he see psychiatrist someone to help manage his depression/anxiety/addiction.   3. 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 Since improving I suggest he reduce down to half tablet or once every other day.  -he will continue to follow up with [facility]  -He reports that he is not taking lasix as prescribed at this time.   5. Hep C, successfully treated -Continue follow-up with liver clinic NP [facility]  6. Anxiety and insomnia  -He has Xanax for anxiety. I previously refilled Xanax 1 mg. -continue Ativan 1 mg at bedtime as needed for insomnia, he knows not to take with Xanax together, he knows not to take during the day. He does not feel Ativan is working well for sleep -He is taking xanax for insomnia, refilled on 12/03/16  7. Fatigue and weight gain -He reports more fatigue and weak and lately -His TSH has been moderately elevated lately, we discussed the side effect of hypothyroidism from Nivolumab, I previously referred him to Dr. Loanne Drilling for evaluation to see if he needs low-dose Synthroid -He continues to gain weight and his wife is concerned. -He does not eat regularly and eats 2 ensure boosts daily along with Megace as needed.  -I do not suspect his weight gain is related to fluid retention. I previously suggested he hold Megace and reduce ensure boosts to once daily. He should use a balanced diet and exercise to help.  -I previously advised him to hold megace as he has been gaining weight  -I  previously advised him that taking a lot of narcotic pain medication can contribute to fatigue  8. HTN -He is on Hydralazine 50 mg daily, and follow-up of his primary care physician  9. Smoking - Patient has started back smoking due to the fear of not getting better - I previously recommended a smoking cessation program. He refused.  10. Depression - The patient reports feelings of depression related to his diagnosis. - He reports fears he is going to die soon. - I previously encouraged the patient that his recent scan shows an excellent  response to treatment. -I am worried that he feels hopeless, he was seen by our social worker Webb Silversmith today and she is going to refer him to a psychiatrist at [facility]  11. Iron deficient anemia  -His hemoglobin dropped to 7.3 as of 10/16/16, lab results was consistent with iron deficiency, possible related to occluded GI bleeding -She is on Xarelto, no clinical overt bleeding. -He received IV feraheme 10/16/16 and in 10/2016  -He had GI work up with upper endoscopy and colonoscopy per Dr. [name] on 11/02/16. Biopsy of the stomach showed mild chronic gastritis, negative for H. Pylori or malignancy. Colon and rectal polyps were negative for dysplasia or malignancy. He will f/u with GI again in 01/2017.  -anemia has resolved, Hg 12.3 on 01/01/17 -if he develops anemia or GI bleeding again, will stop Xarelto   11. Goal of care discussion, DNR/DNI  -We previously discussed the incurable nature of his cancer, and the overall poor prognosis, especially if he does not have good response to chemotherapy or progress on chemo -The patient understands the goal of care is palliative. -he agrees with DNR/DNI    Plan  -I will refill oxycontin and oxycodone today for 2 weeks amount. I advised the patient to have his behavioral health provider call me following the appointment in a few weeks  -lab reviewed, adequate for treatment, will proceed Nivolumab today. -Lab, flush, f/u with me or Lacie and nivolumab in 2 and 4 weeks  -will present his images in GI tumor board next week    All questions were answered. The patient knows to call the clinic with any problems, questions or concerns.  I spent 20 minutes counseling the patient face to face. Pt's partner had many questions, and I answered to her satisfaction.  The total time spent in the appointment was 25 minutes and more than 50% was on counseling.  This document serves as a record of services personally performed by Truitt Merle, MD. It was created on her  behalf by Steva Colder, a trained medical scribe. The creation of this record is based on the scribe's personal observations and the provider's statements to them.   I have reviewed the above documentation for accuracy and completeness, and I agree with the above.    Truitt Merle  06/11/2017 2:08 PM

## 2017-06-11 ENCOUNTER — Inpatient Hospital Stay (HOSPITAL_BASED_OUTPATIENT_CLINIC_OR_DEPARTMENT_OTHER): Payer: Medicare Other | Admitting: Hematology

## 2017-06-11 ENCOUNTER — Inpatient Hospital Stay: Payer: Medicare Other

## 2017-06-11 ENCOUNTER — Telehealth: Payer: Self-pay | Admitting: Hematology

## 2017-06-11 VITALS — BP 177/74 | HR 87 | Temp 98.1°F | Resp 18 | Ht 73.0 in | Wt 206.9 lb

## 2017-06-11 DIAGNOSIS — D509 Iron deficiency anemia, unspecified: Secondary | ICD-10-CM

## 2017-06-11 DIAGNOSIS — Z9221 Personal history of antineoplastic chemotherapy: Secondary | ICD-10-CM

## 2017-06-11 DIAGNOSIS — R530 Neoplastic (malignant) related fatigue: Secondary | ICD-10-CM

## 2017-06-11 DIAGNOSIS — Z7901 Long term (current) use of anticoagulants: Secondary | ICD-10-CM

## 2017-06-11 DIAGNOSIS — F419 Anxiety disorder, unspecified: Secondary | ICD-10-CM | POA: Diagnosis not present

## 2017-06-11 DIAGNOSIS — C220 Liver cell carcinoma: Secondary | ICD-10-CM

## 2017-06-11 DIAGNOSIS — F17210 Nicotine dependence, cigarettes, uncomplicated: Secondary | ICD-10-CM | POA: Diagnosis not present

## 2017-06-11 DIAGNOSIS — D500 Iron deficiency anemia secondary to blood loss (chronic): Secondary | ICD-10-CM

## 2017-06-11 DIAGNOSIS — R635 Abnormal weight gain: Secondary | ICD-10-CM | POA: Diagnosis not present

## 2017-06-11 DIAGNOSIS — K5903 Drug induced constipation: Secondary | ICD-10-CM

## 2017-06-11 DIAGNOSIS — K7469 Other cirrhosis of liver: Secondary | ICD-10-CM

## 2017-06-11 DIAGNOSIS — G893 Neoplasm related pain (acute) (chronic): Secondary | ICD-10-CM

## 2017-06-11 DIAGNOSIS — I10 Essential (primary) hypertension: Secondary | ICD-10-CM

## 2017-06-11 DIAGNOSIS — Z86718 Personal history of other venous thrombosis and embolism: Secondary | ICD-10-CM | POA: Diagnosis not present

## 2017-06-11 DIAGNOSIS — Z5112 Encounter for antineoplastic immunotherapy: Secondary | ICD-10-CM | POA: Diagnosis not present

## 2017-06-11 DIAGNOSIS — F329 Major depressive disorder, single episode, unspecified: Secondary | ICD-10-CM

## 2017-06-11 DIAGNOSIS — Z8619 Personal history of other infectious and parasitic diseases: Secondary | ICD-10-CM

## 2017-06-11 LAB — CMP (CANCER CENTER ONLY)
ALBUMIN: 3.8 g/dL (ref 3.5–5.0)
ALT: 15 U/L (ref 0–55)
AST: 27 U/L (ref 5–34)
Alkaline Phosphatase: 145 U/L (ref 40–150)
Anion gap: 11 (ref 3–11)
BUN: 14 mg/dL (ref 7–26)
CHLORIDE: 107 mmol/L (ref 98–109)
CO2: 22 mmol/L (ref 22–29)
Calcium: 9.1 mg/dL (ref 8.4–10.4)
Creatinine: 1.4 mg/dL — ABNORMAL HIGH (ref 0.70–1.30)
GFR, Est AFR Am: 59 mL/min — ABNORMAL LOW (ref >60)
GFR, Estimated: 51 mL/min — ABNORMAL LOW (ref >60)
GLUCOSE: 107 mg/dL (ref 70–140)
POTASSIUM: 3.8 mmol/L (ref 3.5–5.1)
SODIUM: 140 mmol/L (ref 136–145)
Total Bilirubin: 0.7 mg/dL (ref 0.2–1.2)
Total Protein: 7.6 g/dL (ref 6.4–8.3)

## 2017-06-11 LAB — CBC WITH DIFFERENTIAL (CANCER CENTER ONLY)
Basophils Absolute: 0 10*3/uL (ref 0.0–0.1)
Basophils Relative: 1 %
EOS ABS: 0.3 10*3/uL (ref 0.0–0.5)
EOS PCT: 4 %
HCT: 34.8 % — ABNORMAL LOW (ref 38.4–49.9)
Hemoglobin: 11.9 g/dL — ABNORMAL LOW (ref 13.0–17.1)
LYMPHS ABS: 2 10*3/uL (ref 0.9–3.3)
LYMPHS PCT: 26 %
MCH: 29.9 pg (ref 27.2–33.4)
MCHC: 34.2 g/dL (ref 32.0–36.0)
MCV: 87.3 fL (ref 79.3–98.0)
Monocytes Absolute: 0.6 10*3/uL (ref 0.1–0.9)
Monocytes Relative: 8 %
NEUTROS ABS: 4.7 10*3/uL (ref 1.5–6.5)
Neutrophils Relative %: 61 %
PLATELETS: 151 10*3/uL (ref 140–400)
RBC: 3.99 MIL/uL — AB (ref 4.20–5.82)
RDW: 13.8 % (ref 11.0–14.6)
WBC Count: 7.6 10*3/uL (ref 4.0–10.3)

## 2017-06-11 MED ORDER — SODIUM CHLORIDE 0.9% FLUSH
10.0000 mL | INTRAVENOUS | Status: DC | PRN
  Administered 2017-06-11: 10 mL
  Filled 2017-06-11: qty 10

## 2017-06-11 MED ORDER — OXYCODONE HCL ER 80 MG PO T12A: 160.0000 mg | EXTENDED_RELEASE_TABLET | Freq: Two times a day (BID) | ORAL | 0 refills | Status: DC

## 2017-06-11 MED ORDER — NIVOLUMAB CHEMO INJECTION 100 MG/10ML
240.0000 mg | Freq: Once | INTRAVENOUS | Status: AC
  Administered 2017-06-11: 240 mg via INTRAVENOUS
  Filled 2017-06-11: qty 24

## 2017-06-11 MED ORDER — HEPARIN SOD (PORK) LOCK FLUSH 100 UNIT/ML IV SOLN
500.0000 [IU] | Freq: Once | INTRAVENOUS | Status: AC | PRN
  Administered 2017-06-11: 500 [IU]
  Filled 2017-06-11: qty 5

## 2017-06-11 MED ORDER — SODIUM CHLORIDE 0.9% FLUSH
10.0000 mL | Freq: Once | INTRAVENOUS | Status: AC
  Administered 2017-06-11: 10 mL
  Filled 2017-06-11: qty 10

## 2017-06-11 MED ORDER — SODIUM CHLORIDE 0.9 % IV SOLN
Freq: Once | INTRAVENOUS | Status: AC
  Administered 2017-06-11: 15:00:00 via INTRAVENOUS

## 2017-06-11 MED ORDER — OXYCODONE HCL 20 MG PO TABS: 1.0000 | ORAL_TABLET | ORAL | 0 refills | Status: DC | PRN

## 2017-06-11 NOTE — Telephone Encounter (Signed)
Scheduled apt per 5/24 los - Gave patient AVS and calender per los. Unable to schedule 2 wk treatment due to capped day - logged - will contact patient when appt is scheduled .

## 2017-06-11 NOTE — Patient Instructions (Signed)
[facility] Discharge Instructions for Patients Receiving Chemotherapy  Today you received the following chemotherapy agents :  Opdivo.  To help prevent nausea and vomiting after your treatment, we encourage you to take your nausea medication as prescribed.   If you develop nausea and vomiting that is not controlled by your nausea medication, call the clinic.   BELOW ARE SYMPTOMS THAT SHOULD BE REPORTED IMMEDIATELY:  *FEVER GREATER THAN 100.5 F  *CHILLS WITH OR WITHOUT FEVER  NAUSEA AND VOMITING THAT IS NOT CONTROLLED WITH YOUR NAUSEA MEDICATION  *UNUSUAL SHORTNESS OF BREATH  *UNUSUAL BRUISING OR BLEEDING  TENDERNESS IN MOUTH AND THROAT WITH OR WITHOUT PRESENCE OF ULCERS  *URINARY PROBLEMS  *BOWEL PROBLEMS  UNUSUAL RASH Items with * indicate a potential emergency and should be followed up as soon as possible.  Feel free to call the clinic should you have any questions or concerns. The clinic phone number is [phone].  Please show the CHEMO ALERT CARD at check-in to the Emergency Department and triage nurse.   

## 2017-06-12 LAB — AFP TUMOR MARKER: AFP, Serum, Tumor Marker: 4 ng/mL (ref 0.0–8.3)

## 2017-06-13 ENCOUNTER — Encounter: Payer: Self-pay | Admitting: Hematology

## 2017-06-16 ENCOUNTER — Telehealth: Payer: Self-pay | Admitting: Hematology

## 2017-06-16 NOTE — Telephone Encounter (Signed)
Scheduled appt per 5/24 los - pt is aware of appt date and time for 6/7 appts.

## 2017-06-17 MED FILL — SUBOXONE 8 MG-2 MG SL FILM: 8-2 | 7 days supply | Qty: 21 | Fill #0

## 2017-06-18 ENCOUNTER — Emergency Department (HOSPITAL_COMMUNITY): Payer: Medicare Other

## 2017-06-18 ENCOUNTER — Telehealth: Payer: Self-pay | Admitting: Hematology

## 2017-06-18 ENCOUNTER — Emergency Department (HOSPITAL_COMMUNITY)
Admission: EM | Admit: 2017-06-18 | Discharge: 2017-06-18 | Disposition: A | Discharge status: Home / Self Care | Payer: Medicare Other | Attending: Emergency Medicine | Admitting: Emergency Medicine

## 2017-06-18 DIAGNOSIS — Z7901 Long term (current) use of anticoagulants: Secondary | ICD-10-CM | POA: Insufficient documentation

## 2017-06-18 DIAGNOSIS — Z87891 Personal history of nicotine dependence: Secondary | ICD-10-CM | POA: Insufficient documentation

## 2017-06-18 DIAGNOSIS — F1193 Opioid use, unspecified with withdrawal: Secondary | ICD-10-CM

## 2017-06-18 DIAGNOSIS — R531 Weakness: Secondary | ICD-10-CM | POA: Diagnosis present

## 2017-06-18 DIAGNOSIS — F1123 Opioid dependence with withdrawal: Secondary | ICD-10-CM | POA: Diagnosis not present

## 2017-06-18 DIAGNOSIS — E039 Hypothyroidism, unspecified: Secondary | ICD-10-CM | POA: Insufficient documentation

## 2017-06-18 DIAGNOSIS — R0602 Shortness of breath: Secondary | ICD-10-CM | POA: Diagnosis not present

## 2017-06-18 DIAGNOSIS — Z79899 Other long term (current) drug therapy: Secondary | ICD-10-CM | POA: Diagnosis not present

## 2017-06-18 LAB — CBC WITH DIFFERENTIAL/PLATELET
Basophils Absolute: 0 10*3/uL (ref 0.0–0.1)
Basophils Relative: 0 %
EOS PCT: 2 %
Eosinophils Absolute: 0.2 10*3/uL (ref 0.0–0.7)
HCT: 36 % — ABNORMAL LOW (ref 39.0–52.0)
Hemoglobin: 12.5 g/dL — ABNORMAL LOW (ref 13.0–17.0)
Lymphocytes Relative: 23 %
Lymphs Abs: 2.1 10*3/uL (ref 0.7–4.0)
MCH: 29.8 pg (ref 26.0–34.0)
MCHC: 34.7 g/dL (ref 30.0–36.0)
MCV: 85.7 fL (ref 78.0–100.0)
Monocytes Absolute: 0.7 10*3/uL (ref 0.1–1.0)
Monocytes Relative: 7 %
Neutro Abs: 6.1 10*3/uL (ref 1.7–7.7)
Neutrophils Relative %: 68 %
PLATELETS: 181 10*3/uL (ref 150–400)
RBC: 4.2 MIL/uL — AB (ref 4.22–5.81)
RDW: 13.8 % (ref 11.5–15.5)
WBC: 9.1 10*3/uL (ref 4.0–10.5)

## 2017-06-18 LAB — COMPREHENSIVE METABOLIC PANEL
ALT: 15 U/L — AB (ref 17–63)
ANION GAP: 8 (ref 5–15)
AST: 29 U/L (ref 15–41)
Albumin: 4.1 g/dL (ref 3.5–5.0)
Alkaline Phosphatase: 127 U/L — ABNORMAL HIGH (ref 38–126)
BUN: 13 mg/dL (ref 6–20)
CHLORIDE: 107 mmol/L (ref 101–111)
CO2: 23 mmol/L (ref 22–32)
Calcium: 9.1 mg/dL (ref 8.9–10.3)
Creatinine, Ser: 1.09 mg/dL (ref 0.61–1.24)
GFR calc Af Amer: >60 mL/min (ref >60)
GFR calc non Af Amer: >60 mL/min (ref >60)
Glucose, Bld: 112 mg/dL — ABNORMAL HIGH (ref 65–99)
POTASSIUM: 3.6 mmol/L (ref 3.5–5.1)
SODIUM: 138 mmol/L (ref 135–145)
Total Bilirubin: 0.5 mg/dL (ref 0.3–1.2)
Total Protein: 8.1 g/dL (ref 6.5–8.1)

## 2017-06-18 LAB — URINALYSIS, ROUTINE W REFLEX MICROSCOPIC
Bilirubin Urine: NEGATIVE
GLUCOSE, UA: NEGATIVE mg/dL
HGB URINE DIPSTICK: NEGATIVE
Ketones, ur: NEGATIVE mg/dL
Leukocytes, UA: NEGATIVE
Nitrite: NEGATIVE
PH: 7 (ref 5.0–8.0)
PROTEIN: NEGATIVE mg/dL
SPECIFIC GRAVITY, URINE: 1.01 (ref 1.005–1.030)

## 2017-06-18 LAB — AMMONIA: Ammonia: 21 umol/L (ref 9–35)

## 2017-06-18 LAB — I-STAT CG4 LACTIC ACID, ED: LACTIC ACID, VENOUS: 0.7 mmol/L (ref 0.5–1.9)

## 2017-06-18 MED ORDER — ONDANSETRON HCL 4 MG/2ML IJ SOLN
4.0000 mg | Freq: Once | INTRAMUSCULAR | Status: AC
  Administered 2017-06-18: 4 mg via INTRAVENOUS
  Filled 2017-06-18: qty 2

## 2017-06-18 MED ORDER — HYDROMORPHONE HCL 1 MG/ML IJ SOLN
1.0000 mg | Freq: Once | INTRAMUSCULAR | Status: AC
  Administered 2017-06-18: 1 mg via INTRAVENOUS
  Filled 2017-06-18: qty 1

## 2017-06-18 MED ORDER — SODIUM CHLORIDE 0.9 % IV BOLUS
1000.0000 mL | Freq: Once | INTRAVENOUS | Status: AC
  Administered 2017-06-18: 1000 mL via INTRAVENOUS

## 2017-06-18 MED ORDER — HEPARIN SOD (PORK) LOCK FLUSH 100 UNIT/ML IV SOLN
500.0000 [IU] | Freq: Once | INTRAVENOUS | Status: AC
  Administered 2017-06-18: 500 [IU]
  Filled 2017-06-18: qty 5

## 2017-06-18 NOTE — ED Provider Notes (Signed)
[name] Provider Note   CSN: [id number] Arrival date & time: 06/18/17  1358     History   Chief Complaint Chief Complaint  Patient presents with  . Weakness  . Diarrhea    HPI Kevin Dillon is a 67 y.o. male.  67 yo M with a chief complaint of weakness and diarrhea.  Is also been having subjective fevers and chills at home.  Is been going on for the past 2 or 3 days.  He denies sick contacts.  He was recently started on Suboxone for his chronic narcotic use and the family attributes this medication change to his symptoms.  He has had some congestion and cough.  He denies black dark stool or blood in stool.  He denies crushing or injecting his medication.  The history is provided by the patient.  Weakness  Pertinent negatives include no shortness of breath, no chest pain, no vomiting, no confusion and no headaches.  Diarrhea   Associated symptoms include abdominal pain (chronic and unchanged). Pertinent negatives include no vomiting, no chills, no headaches, no arthralgias and no myalgias.  Illness  This is a new problem. The current episode started more than 2 days ago. The problem occurs constantly. The problem has been gradually worsening. Associated symptoms include abdominal pain (chronic and unchanged). Pertinent negatives include no chest pain, no headaches and no shortness of breath. Nothing aggravates the symptoms. Nothing relieves the symptoms. He has tried nothing for the symptoms. The treatment provided no relief.    Past Medical History:  Diagnosis Date  . Cancer ([place])    liver  . Cholelithiasis   . Chronic hepatitis C ([place])   . Chronic knee pain   . Chronic left shoulder pain   . CVA (cerebral infarction)   . Gout   . Hepatitis C    genotype 1b.  pt has been vaccinated against hep A and B.  . Hypertension   . Osteoarthritis   . Schatzki's ring   . Tubular adenoma of colon 12/2011    Patient Active Problem List   Diagnosis Date Noted  . Hypothyroidism 03/14/2017  . Spasm of muscle of lower back 11/05/2016  . Iron deficiency anemia due to chronic blood loss 10/16/2016  . Anemia due to antineoplastic chemotherapy 10/08/2016  . Goals of care, counseling/discussion 01/28/2016  . 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 Cancer related pain 10/25/2015  . Liver cirrhosis ([place]) 10/25/2015  . Hepatocellular carcinoma ([place]) 04/18/2013  . Unspecified constipation 03/01/2012  . Tubular adenoma of colon 03/01/2012  . Hx of hepatitis C 12/16/2011  . Elevated LFTs 12/16/2011    Past Surgical History:  Procedure Laterality Date  . AMPUTATION Left 09/17/2012   Procedure: SMALL FINGER EXTENSOR TENDON REPAIR; METACARPAL LEVEL AMPUTATION RING FINGER; PROXIMAL PHALANX LEVEL AMPUTATION LONG FINGER;  Surgeon: Schuyler Amor, MD;  Location: [place];  Service: Orthopedics;  Laterality: Left;  . Arm surgery     right/plate in arm  . BIOPSY  11/02/2016   Procedure: BIOPSY;  Surgeon: Daneil Dolin, MD;  [facility];  Service: Endoscopy;;  gastric   . COLONOSCOPY WITH ESOPHAGOGASTRODUODENOSCOPY (EGD)  12/30/2011   RMR: Noncritical Schatzki's ring;  Hiatal hernia, Tubular ADENOMA removed from splenic flexure, otherwise normal colonoscopy  . COLONOSCOPY WITH PROPOFOL N/A 11/02/2016   Procedure: COLONOSCOPY WITH PROPOFOL;  Surgeon: Daneil Dolin, MD;  [facility];  Service: Endoscopy;  Laterality: N/A;  1045  .  ESOPHAGOGASTRODUODENOSCOPY (EGD) WITH PROPOFOL N/A 11/02/2016   Procedure: ESOPHAGOGASTRODUODENOSCOPY (EGD) WITH PROPOFOL;  Surgeon: Daneil Dolin, MD;  [facility];  Service: Endoscopy;  Laterality: N/A;  . IR FLUORO GUIDE PORT INSERTION RIGHT  10/21/2016  . IR US GUIDE VASC ACCESS RIGHT  10/21/2016  . LEG SURGERY     left  . POLYPECTOMY  11/02/2016   Procedure: POLYPECTOMY;  Surgeon: Daneil Dolin, MD;  [facility];  Service:  Endoscopy;;  colon  . WOUND EXPLORATION Left 09/17/2012   Procedure: WOUND EXPLORATION;  Surgeon: Schuyler Amor, MD;  Location: [facility];  Service: Orthopedics;  Laterality: Left;        Home Medications    Prior to Admission medications   Medication Sig Start Date End Date Taking? Authorizing Provider  ALPRAZolam Duanne Moron) 1 MG tablet Take 1 tablet (1 mg total) by mouth every 6 (six) hours as needed for anxiety. 05/21/17  Yes Alla Feeling, NP  cyclobenzaprine (FLEXERIL) 5 MG tablet Take 1 tablet (5 mg total) by mouth 3 (three) times daily as needed for muscle spasms. 11/05/16  Yes Alla Feeling, NP  DULoxetine (CYMBALTA) 30 MG capsule Take 1 capsule (30 mg total) by mouth daily. 05/21/17  Yes Alla Feeling, NP  hydrALAZINE (APRESOLINE) 25 MG tablet Take 75 mg by mouth daily.  08/21/15  Yes [provider]  lactulose (CHRONULAC) 10 GM/15ML solution Take 30 g by mouth daily as needed for moderate constipation.  01/06/16  Yes [provider]  lidocaine-prilocaine (EMLA) cream Apply 1 application topically as needed. Apply to portacath 1 1/2 hours - 2 hours prior to procedures as needed. 10/26/16  Yes Truitt Merle, MD  megestrol (MEGACE) 40 MG/ML suspension Take 400 mg by mouth daily.  10/07/15  Yes [provider]  Multiple Vitamins-Minerals (CENTRUM SILVER PO) Take 1 tablet by mouth daily.    Yes [provider]  oxyCODONE (OXYCONTIN) 80 mg 12 hr tablet Take 2 tablets (160 mg total) by mouth every 12 (twelve) hours. 06/11/17  Yes Truitt Merle, MD  Oxycodone HCl 20 MG TABS Take 1 tablet (20 mg total) by mouth every 4 (four) hours as needed. Patient taking differently: Take 1 tablet by mouth every 4 (four) hours as needed (pain).  06/11/17  Yes Truitt Merle, MD  prochlorperazine (COMPAZINE) 10 MG tablet TAKE 1 TABLET BY MOUTH EVERY 8 HOURS AS NEEDED FOR NAUSEA/VOMITING 05/18/17  Yes Truitt Merle, MD  rivaroxaban (XARELTO) 20 MG TABS tablet TAKE 1 TABLET BY MOUTH DAILY WITH  SUPPER. 04/09/17  Yes Truitt Merle, MD  SUBOXONE 8-2 MG FILM Take 1 Film by mouth 3 (three) times daily. 06/17/17  Yes [provider]    Family History Family History  Problem Relation Age of Onset  . Cirrhosis Father 57  . Cancer Father        liver cancer   . Lung cancer Mother 60  . Colon cancer Neg Hx     Social History Social History   Tobacco Use  . Smoking status: Former Smoker    Packs/day: 0.50    Years: 40.00    Pack years: 20.00    Types: Cigarettes    Last attempt to quit: 01/19/2012    Years since quitting: 5.4  . Smokeless tobacco: Never Used  . Tobacco comment: Smokes 1/2 pack of cigarettes daily  Substance Use Topics  . Alcohol use: No    Comment: HX 2 beers 3-4 days per week; QUIT  DEC 2013  . Drug use: No    Comment: Hx cocaine yrs ago, Last marijuana OCT 2013     Allergies   Patient has no known allergies.   Review of Systems Review of Systems  Constitutional: Negative for chills and fever.  HENT: Negative for congestion and facial swelling.   Eyes: Negative for discharge and visual disturbance.  Respiratory: Negative for shortness of breath.   Cardiovascular: Negative for chest pain and palpitations.  Gastrointestinal: Positive for abdominal pain (chronic and unchanged) and diarrhea. Negative for vomiting.  Musculoskeletal: Negative for arthralgias and myalgias.  Skin: Negative for color change and rash.  Neurological: Positive for weakness. Negative for tremors, syncope and headaches.  Psychiatric/Behavioral: Negative for confusion and dysphoric mood.     Physical Exam Updated Vital Signs BP (!) 198/94   Pulse 80   Temp 97.8 F (36.6 C) (Oral)   Resp 20   SpO2 95%   Physical Exam  Constitutional: He is oriented to person, place, and time. He appears well-developed and well-nourished.  HENT:  Head: Normocephalic and atraumatic.  Eyes: Pupils are equal, round, and reactive to light. EOM are normal.  Neck: Normal range of  motion. Neck supple. No JVD present.  Cardiovascular: Normal rate and regular rhythm. Exam reveals no gallop and no friction rub.  No murmur heard. Pulmonary/Chest: No respiratory distress. He has decreased breath sounds in the left lower field. He has no wheezes.  Abdominal: He exhibits no distension. There is no rebound and no guarding.  Musculoskeletal: Normal range of motion.  Neurological: He is alert and oriented to person, place, and time.  Skin: No rash noted. No pallor.  Psychiatric: He has a normal mood and affect. His behavior is normal.  Nursing note and vitals reviewed.    ED Treatments / Results  Labs (all labs ordered are listed, but only abnormal results are displayed) Labs Reviewed  COMPREHENSIVE METABOLIC PANEL - Abnormal; Notable for the following components:      Result Value   Glucose, Bld 112 (*)    ALT 15 (*)    Alkaline Phosphatase 127 (*)    All other components within normal limits  CBC WITH DIFFERENTIAL/PLATELET - Abnormal; Notable for the following components:   RBC 4.20 (*)    Hemoglobin 12.5 (*)    HCT 36.0 (*)    All other components within normal limits  URINALYSIS, ROUTINE W REFLEX MICROSCOPIC  AMMONIA  I-STAT CG4 LACTIC ACID, ED    EKG None  Radiology Dg Chest 2 View  Result Date: 06/18/2017 CLINICAL DATA:  Shortness of breath and fevers EXAM: CHEST - 2 VIEW COMPARISON:  06/01/2017 FINDINGS: Cardiac shadow is mildly enlarged but stable. Right chest wall port is noted in satisfactory position. The lungs are well aerated bilaterally with mild vascular congestion without significant interstitial edema. No focal infiltrate or effusion is noted. No acute bony abnormality is seen. IMPRESSION: Mild vascular congestion. Electronically Signed   By: Inez Catalina M.D.   On: 06/18/2017 16:57    Procedures Procedures (including critical care time)  Medications Ordered in ED Medications  ondansetron (ZOFRAN) injection 4 mg (4 mg Intravenous Given  06/18/17 1546)  HYDROmorphone (DILAUDID) injection 1 mg (1 mg Intravenous Given 06/18/17 1546)  sodium chloride 0.9 % bolus 1,000 mL (1,000 mLs Intravenous New Bag/Given 06/18/17 1546)     Initial Impression / Assessment and Plan / ED Course  I have reviewed the triage vital signs and the nursing notes.  Pertinent labs &  imaging results that were available during my care of the patient were reviewed by me and considered in my medical decision making (see chart for details).     67 yo M with a chief complaint of subjective fevers and chills and congestion and diarrhea.  The patient is currently being treated for liver cancer.  The patient is currently feeling better after a milligram of Dilaudid.  Likely the patient's oncologist happens to see that he was in the emergency department and came and talked to the patient as well as myself.  She thinks that the patient likely took the Suboxone when he still had narcotics in his system and it displaced the opiates from the receptors and precipitated a withdrawal.  As the patient is feeling much better I will attempt to ambulate him.  If he is able will have him follow-up with his pain management and oncologist.  Ambulates without difficulty.   6:39 PM:  I have discussed the diagnosis/risks/treatment options with the patient and family and believe the pt to be eligible for discharge home to follow-up with Oncology. We also discussed returning to the ED immediately if new or worsening sx occur. We discussed the sx which are most concerning (e.g., sudden worsening pain, fever, inability to tolerate by mouth) that necessitate immediate return. Medications administered to the patient during their visit and any new prescriptions provided to the patient are listed below.  Medications given during this visit Medications  ondansetron (ZOFRAN) injection 4 mg (4 mg Intravenous Given 06/18/17 1546)  HYDROmorphone (DILAUDID) injection 1 mg (1 mg Intravenous Given  06/18/17 1546)  sodium chloride 0.9 % bolus 1,000 mL (1,000 mLs Intravenous New Bag/Given 06/18/17 1546)      The patient appears reasonably screen and/or stabilized for discharge and I doubt any other medical condition or other [facility] requiring further screening, evaluation, or treatment in the ED at this time prior to discharge.    Final Clinical Impressions(s) / ED Diagnoses   Final diagnoses:  Opiate withdrawal [facility])    ED Discharge Orders    None       Deno Etienne, DO 06/18/17 1839

## 2017-06-18 NOTE — ED Notes (Signed)
ED Provider at bedside. 

## 2017-06-18 NOTE — ED Notes (Signed)
Pt ambulated to the restroom with no assistance.

## 2017-06-18 NOTE — Discharge Instructions (Signed)
Please take your medications as prescribed.  Discuss with your oncologist and pain management doc on Monday.

## 2017-06-18 NOTE — ED Triage Notes (Addendum)
Hx of liver cancer, currently receives chemotherapy every 2 weeks. Diagnosed 3 years ago. Wife reports that patient was recently prescribed Suboxone and did not react well to it. +weakness, AMS, diarrhea, chills, persistent pain.

## 2017-06-18 NOTE — Telephone Encounter (Signed)
I called pt's significant other Pat back. Geran was [facility] Dr. Rachael Fee yesterday and started Suboxone.  He got very sick after taking the medication, with severe nausea, extreme fatigue, no appetite, not able to eat and drink. Pt called [place] and is very upset due to the reactions he had. Per Harriett Rush followed the instruction of taking suboxone.   I called Dr. Lynnda Shields and he explained to me how to take suboxone and potential severe withdrawal symptoms if patient did not wait until opiates withdrawal symptoms started when they took suboxone, which is likely what happened to him.  He recommends him to continue suboxone as instructed, which will improve his withdraw symptoms. He has tried to call pt but could not get hold of him. My nurse called pt and [name] and left a message for them, so we can get him connected to Dr. Lynnda Shields. He will see him in his clinic next Wednesday.   I recommend pt to go to [facility] ED to be evaluated. Please do not prescribe any narcotics. Call me if needed.   Truitt Merle  06/18/2017 3:23 PM

## 2017-06-21 ENCOUNTER — Inpatient Hospital Stay: Payer: Medicare Other | Attending: Hematology | Admitting: Hematology

## 2017-06-21 ENCOUNTER — Inpatient Hospital Stay: Payer: Medicare Other

## 2017-06-21 ENCOUNTER — Encounter: Payer: Self-pay | Admitting: Hematology

## 2017-06-21 DIAGNOSIS — R109 Unspecified abdominal pain: Secondary | ICD-10-CM | POA: Diagnosis not present

## 2017-06-21 DIAGNOSIS — Z9221 Personal history of antineoplastic chemotherapy: Secondary | ICD-10-CM | POA: Insufficient documentation

## 2017-06-21 DIAGNOSIS — Z800 Family history of malignant neoplasm of digestive organs: Secondary | ICD-10-CM | POA: Insufficient documentation

## 2017-06-21 DIAGNOSIS — M545 Low back pain: Secondary | ICD-10-CM | POA: Diagnosis not present

## 2017-06-21 DIAGNOSIS — Z5112 Encounter for antineoplastic immunotherapy: Secondary | ICD-10-CM | POA: Insufficient documentation

## 2017-06-21 DIAGNOSIS — R531 Weakness: Secondary | ICD-10-CM | POA: Insufficient documentation

## 2017-06-21 DIAGNOSIS — Z87891 Personal history of nicotine dependence: Secondary | ICD-10-CM | POA: Diagnosis not present

## 2017-06-21 DIAGNOSIS — F1123 Opioid dependence with withdrawal: Secondary | ICD-10-CM | POA: Diagnosis not present

## 2017-06-21 DIAGNOSIS — R634 Abnormal weight loss: Secondary | ICD-10-CM | POA: Diagnosis not present

## 2017-06-21 DIAGNOSIS — G893 Neoplasm related pain (acute) (chronic): Secondary | ICD-10-CM | POA: Diagnosis not present

## 2017-06-21 DIAGNOSIS — R5383 Other fatigue: Secondary | ICD-10-CM | POA: Diagnosis not present

## 2017-06-21 DIAGNOSIS — D500 Iron deficiency anemia secondary to blood loss (chronic): Secondary | ICD-10-CM

## 2017-06-21 DIAGNOSIS — F17210 Nicotine dependence, cigarettes, uncomplicated: Secondary | ICD-10-CM | POA: Diagnosis not present

## 2017-06-21 DIAGNOSIS — Z7901 Long term (current) use of anticoagulants: Secondary | ICD-10-CM | POA: Insufficient documentation

## 2017-06-21 DIAGNOSIS — F419 Anxiety disorder, unspecified: Secondary | ICD-10-CM | POA: Diagnosis not present

## 2017-06-21 DIAGNOSIS — F329 Major depressive disorder, single episode, unspecified: Secondary | ICD-10-CM | POA: Diagnosis not present

## 2017-06-21 DIAGNOSIS — K7460 Unspecified cirrhosis of liver: Secondary | ICD-10-CM | POA: Diagnosis not present

## 2017-06-21 DIAGNOSIS — Z801 Family history of malignant neoplasm of trachea, bronchus and lung: Secondary | ICD-10-CM | POA: Diagnosis not present

## 2017-06-21 DIAGNOSIS — D509 Iron deficiency anemia, unspecified: Secondary | ICD-10-CM | POA: Insufficient documentation

## 2017-06-21 DIAGNOSIS — R197 Diarrhea, unspecified: Secondary | ICD-10-CM | POA: Insufficient documentation

## 2017-06-21 DIAGNOSIS — G4700 Insomnia, unspecified: Secondary | ICD-10-CM | POA: Diagnosis not present

## 2017-06-21 DIAGNOSIS — I10 Essential (primary) hypertension: Secondary | ICD-10-CM | POA: Diagnosis not present

## 2017-06-21 DIAGNOSIS — C220 Liver cell carcinoma: Secondary | ICD-10-CM | POA: Diagnosis not present

## 2017-06-21 DIAGNOSIS — Z66 Do not resuscitate: Secondary | ICD-10-CM | POA: Insufficient documentation

## 2017-06-21 MED ORDER — SODIUM CHLORIDE 0.9% FLUSH
10.0000 mL | Freq: Once | INTRAVENOUS | Status: AC
  Administered 2017-06-21: 10 mL
  Filled 2017-06-21: qty 10

## 2017-06-21 MED ORDER — SODIUM CHLORIDE 0.9 % IV SOLN
Freq: Once | INTRAVENOUS | Status: AC
  Administered 2017-06-21: 15:00:00 via INTRAVENOUS

## 2017-06-21 MED ORDER — ALPRAZOLAM 1 MG PO TABS: 1.0000 mg | ORAL_TABLET | Freq: Every evening | ORAL | 0 refills | Status: DC | PRN

## 2017-06-21 MED ORDER — SODIUM CHLORIDE 0.9 % IV SOLN
INTRAVENOUS | Status: DC
  Administered 2017-06-21: 16:00:00 via INTRAVENOUS

## 2017-06-21 MED ORDER — HEPARIN SOD (PORK) LOCK FLUSH 100 UNIT/ML IV SOLN
500.0000 [IU] | Freq: Once | INTRAVENOUS | Status: AC
  Administered 2017-06-21: 500 [IU]
  Filled 2017-06-21: qty 5

## 2017-06-21 MED ORDER — OXYCODONE HCL 5 MG PO TABS: 5.0000 mg | ORAL_TABLET | Freq: Four times a day (QID) | ORAL | 0 refills | Status: DC | PRN

## 2017-06-21 MED FILL — ALPRAZolam 1 MG TABS: 1 | 30 days supply | Qty: 30 | Fill #0

## 2017-06-21 MED FILL — oxyCODONE HCL 5 MG TABS: 5 | 5 days supply | Qty: 20 | Fill #0

## 2017-06-21 NOTE — Patient Instructions (Signed)
Dehydration, Adult Dehydration is a condition in which there is not enough fluid or water in the body. This happens when you lose more fluids than you take in. Important organs, such as the kidneys, brain, and heart, cannot function without a proper amount of fluids. Any loss of fluids from the body can lead to dehydration. Dehydration can range from mild to severe. This condition should be treated right away to prevent it from becoming severe. What are the causes? This condition may be caused by:  Vomiting.  Diarrhea.  Excessive sweating, such as from heat exposure or exercise.  Not drinking enough fluid, especially: ? When ill. ? While doing activity that requires a lot of energy.  Excessive urination.  Fever.  Infection.  Certain medicines, such as medicines that cause the body to lose excess fluid (diuretics).  Inability to access safe drinking water.  Reduced physical ability to get adequate water and food.  What increases the risk? This condition is more likely to develop in people:  Who have a poorly controlled long-term (chronic) illness, such as diabetes, heart disease, or kidney disease.  Who are age 65 or older.  Who are disabled.  Who live in a place with high altitude.  Who play endurance sports.  What are the signs or symptoms? Symptoms of mild dehydration may include:  Thirst.  Dry lips.  Slightly dry mouth.  Dry, warm skin.  Dizziness. Symptoms of moderate dehydration may include:  Very dry mouth.  Muscle cramps.  Dark urine. Urine may be the color of tea.  Decreased urine production.  Decreased tear production.  Heartbeat that is irregular or faster than normal (palpitations).  Headache.  Light-headedness, especially when you stand up from a sitting position.  Fainting (syncope). Symptoms of severe dehydration may include:  Changes in skin, such as: ? Cold and clammy skin. ? Blotchy (mottled) or pale skin. ? Skin that does  not quickly return to normal after being lightly pinched and released (poor skin turgor).  Changes in body fluids, such as: ? Extreme thirst. ? No tear production. ? Inability to sweat when body temperature is high, such as in hot weather. ? Very little urine production.  Changes in vital signs, such as: ? Weak pulse. ? Pulse that is more than 100 beats a minute when sitting still. ? Rapid breathing. ? Low blood pressure.  Other changes, such as: ? Sunken eyes. ? Cold hands and feet. ? Confusion. ? Lack of energy (lethargy). ? Difficulty waking up from sleep. ? Short-term weight loss. ? Unconsciousness. How is this diagnosed? This condition is diagnosed based on your symptoms and a physical exam. Blood and urine tests may be done to help confirm the diagnosis. How is this treated? Treatment for this condition depends on the severity. Mild or moderate dehydration can often be treated at home. Treatment should be started right away. Do not wait until dehydration becomes severe. Severe dehydration is an emergency and it needs to be treated in a hospital. Treatment for mild dehydration may include:  Drinking more fluids.  Replacing salts and minerals in your blood (electrolytes) that you may have lost. Treatment for moderate dehydration may include:  Drinking an oral rehydration solution (ORS). This is a drink that helps you replace fluids and electrolytes (rehydrate). It can be found at pharmacies and retail stores. Treatment for severe dehydration may include:  Receiving fluids through an IV tube.  Receiving an electrolyte solution through a feeding tube that is passed through your nose   and into your stomach (nasogastric tube, or NG tube).  Correcting any abnormalities in electrolytes.  Treating the underlying cause of dehydration. Follow these instructions at home:  If directed by your health care provider, drink an ORS: ? Make an ORS by following instructions on the  package. ? Start by drinking small amounts, about  cup (120 mL) every 5-10 minutes. ? Slowly increase how much you drink until you have taken the amount recommended by your health care provider.  Drink enough clear fluid to keep your urine clear or pale yellow. If you were told to drink an ORS, finish the ORS first, then start slowly drinking other clear fluids. Drink fluids such as: ? Water. Do not drink only water. Doing that can lead to having too little salt (sodium) in the body (hyponatremia). ? Ice chips. ? Fruit juice that you have added water to (diluted fruit juice). ? Low-calorie sports drinks.  Avoid: ? Alcohol. ? Drinks that contain a lot of sugar. These include high-calorie sports drinks, fruit juice that is not diluted, and soda. ? Caffeine. ? Foods that are greasy or contain a lot of fat or sugar.  Take over-the-counter and prescription medicines only as told by your health care provider.  Do not take sodium tablets. This can lead to having too much sodium in the body (hypernatremia).  Eat foods that contain a healthy balance of electrolytes, such as bananas, oranges, potatoes, tomatoes, and spinach.  Keep all follow-up visits as told by your health care provider. This is important. Contact a health care provider if:  You have abdominal pain that: ? Gets worse. ? Stays in one area (localizes).  You have a rash.  You have a stiff neck.  You are more irritable than usual.  You are sleepier or more difficult to wake up than usual.  You feel weak or dizzy.  You feel very thirsty.  You have urinated only a small amount of very dark urine over 6-8 hours. Get help right away if:  You have symptoms of severe dehydration.  You cannot drink fluids without vomiting.  Your symptoms get worse with treatment.  You have a fever.  You have a severe headache.  You have vomiting or diarrhea that: ? Gets worse. ? Does not go away.  You have blood or green matter  (bile) in your vomit.  You have blood in your stool. This may cause stool to look black and tarry.  You have not urinated in 6-8 hours.  You faint.  Your heart rate while sitting still is over 100 beats a minute.  You have trouble breathing. This information is not intended to replace advice given to you by your health care provider. Make sure you discuss any questions you have with your health care provider. Document Released: 01/05/2005 Document Revised: 08/02/2015 Document Reviewed: 03/01/2015 Elsevier Interactive Patient Education  2018 [employer]  

## 2017-06-21 NOTE — Progress Notes (Signed)
[place]  [phone] [phone]  Clinic Follow up Note   Patient Care Team: Antonietta Jewel, MD as PCP - General (Internal Medicine) [name], Cristopher Estimable, MD as Attending Physician (Gastroenterology) Antonietta Jewel, MD as Referring Physician (Internal Medicine) Truitt Merle, MD as Consulting Physician (Hematology) [name], CRNP as Nurse Practitioner (Nurse Practitioner) Lynnda Shields, Frances Nickels, DO as Referring Physician (Osteopathic Medicine)   Date of Service:  06/21/2017   CHIEF COMPLAINTS:  Follow up recurrent [place]   Oncology History   Hepatocellular carcinoma [facility])   Staging form: Liver (Excluding Intrahepatic Bile Ducts), AJCC 7th Edition   - Clinical stage from 04/16/2012: Stage II (T2(m), N0, M0) - Signed by Truitt Merle, MD on 10/12/2015   - Pathologic stage from 04/16/2013: Stage II (T2, N0, cM0) - Signed by Truitt Merle, MD on 10/12/2015      Hepatocellular carcinoma ([place])   09/14/2012 Tumor Marker    AFP 28.9      04/16/2013 Imaging    Abdominal MRI with and without contrast reviewed multifocal (4) liver lesions in both left and right lobes, most consistent with [place], largest measuring 2.7 cm      04/16/2013 Initial Diagnosis    Hepatocellular carcinoma ([place])      04/28/2013 Procedure    Right TACE with lipiodol       06/15/2013 Procedure    Left TACE with [facility]       07/14/2013 Procedure    Right TACE with [facility]      09/05/2015 Imaging    CT abdomen with and without contrast showed a new 5.0 x 2.3 cm mass in the hepatic segment 8, invading and occluding the right anterior portal vein, most compatible with [place]. A portacaval node measuring 3.0 x 1.4 cm, previously 2.9 x 1.1 cm.      10/11/2015 Tumor Marker    AFP 5.3      11/11/2015 - 02/03/2016 Chemotherapy    sorafenib 200mg  bid started on 11/11/2015, increased to 400mg  bid in 2 weeks, stopped due to poor tolerance and probable disease progression       11/14/2015 Imaging    CT CAP  11/14/2015 IMPRESSION: Chest Impression: 1. RIGHT upper lobe pulmonary nodule is indeterminate. No comparison available. 2. Ground-glass opacity and peripheral nodules in the RIGHT middle lobe appear post infectious or inflammatory.  Abdomen / Pelvis Impression: 1. 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 Thrombus also likely within the LEFT and RIGHT portal vein. Difficult to distinguish tumor thrombus versus bland thrombus. 2. Individual lesions are difficult to define in the RIGHT hepatic lobe. There is overall impression of progression of disease in the RIGHT hepatic lobe with multiple ill-defined enhancing lesions. 3. Periportal and shotty retroperitoneal adenopathy similar to prior.      01/25/2016 Imaging    CT Abdomen Pelvis w/ Contrast IMPRESSION: 1. Findings consistent with multifocal liver carcinoma with extensive portal vein thrombosis, and subsequent development of porta hepatis venous collaterals. Thrombus in the central portal vein is no longer expansile, but still expands the Dillon peripheral portal vein and the right and left portal veins. There are few right liver bile ducts there are now dilated, which suggests mild progression of liver disease. 2. There is mild splenomegaly consistent or venous hypertension, which has mildly increased from the prior CT. 3. There are new lung abnormalities with interstitial thickening and ill-defined peribronchovascular nodular opacities mostly at the  right lung base with a smaller area noted along the medial left lower lobe. The etiology may be infectious. It may be inflammatory, possibly related to chemotherapy. Neoplastic disease is also possible. 4. There is no other evidence of metastatic disease within the abdomen or pelvis. No acute findings within the abdomen or pelvis      01/25/2016 - 01/25/2016 Hospital Admission    Pt was  seen in ED for worsening abdominal pain, treated with pain meds       02/04/2016 -  Chemotherapy    Nivolumab 240mg  every 2 weeks, started on 02/04/2016; on 04/30/16 switched to every 3 weeks due to fatigue; change from 240mg  to 480mg  every 4 weeks starting 09/03/2016. Given his poor toleration, will reduce him to every 2 weeks at 240mg  starting 01/02/17. Changed to every 3 weeks on 04/09/17       02/14/2016 Imaging    CT CAP w/ contrast 1. Slight interval increase in size of the mediastinal lymph nodes. 2. Persistent extensive lower lobe peribronchial thickening and patchy infiltrates. Possible aspiration pneumonia. 3. Persistent tree-in-bud appearance in the right middle lobe, likely chronic inflammation or atypical infection. 4. Improved CT appearance of the liver. Two small residual arterial phase enhancing lesions are identified. 5. Chronic portal vein thrombosis with cavernous transformation of portal venous collaterals. Esophageal varices are again noted. 6. Stable upper abdominal lymph nodes likely related to cirrhosis.       04/16/2016 Tumor Marker    AFP 4.1       04/17/2016 Imaging     CT CAP W PELVIS IMPRESSION: 1. Stable mild mediastinal lymphadenopathy. 2. Interval improvement in the bibasilar peribronchial thickening, airway impaction, and peribronchovascular nodularity. Imaging features suggest marked improvement in bilateral low the atypical infection. Changes in the right middle lobe with Dillon stable and may represent scarring from previous infectious/inflammatory etiology. 3. The small hypervascular lesions seen in the anterior left liver on the previous study are not evident today. There is some subtle, ill-defined hyperenhancement in the central left liver which is nonspecific. Attention to this area on followup imaging recommended. 4. Chronic portal vein inclusion with cavernous transformation in the porta hepatis and paraesophageal varices. 5. Stable appearance  hepatoduodenal and retroperitoneal lymphadenopathy, likely chronic and related to liver disease.      04/17/2016 Imaging    CT CAP CONTRAST IMPRESSION: 1. Stable mild mediastinal lymphadenopathy. 2. Interval improvement in the bibasilar peribronchial thickening, airway impaction, and peribronchovascular nodularity. Imaging features suggest marked improvement in bilateral low the atypical infection. Changes in the right middle lobe with Dillon stable and may represent scarring from previous infectious/inflammatory etiology. 3. The small hypervascular lesions seen in the anterior left liver on the previous study are not evident today. There is some subtle, ill-defined hyperenhancement in the central left liver which is nonspecific. Attention to this area on followup imaging recommended. 4. Chronic portal vein inclusion with cavernous transformation in the porta hepatis and paraesophageal varices. 5. Stable appearance hepatoduodenal and retroperitoneal lymphadenopathy, likely chronic and related to liver disease.      05/21/2016 Imaging    MR Abdomen W WO Contrast IMPRESSION: 1. Limited motion degraded scan. Cirrhosis. No evidence of a liver mass within these limitations. 2. Mild splenomegaly. No ascites. Stable mild paraumbilical and gastroesophageal varices. 3. Stable nonspecific mild retroperitoneal lymphadenopathy. 4. 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      06/04/2016 Tumor Marker    AFP 3.5  08/25/2016 Imaging    MR Abdomen w wo contrast  IMPRESSION: 1. Mild-to-moderate motion degradation, preferentially involving the pre and postcontrast dynamic images. On follow-up, given the extent of motion on this exam, multiphase CT may be preferred. 2. Given these limitations, no evidence of hepatocellular carcinoma. 3. Marked cirrhosis and portal venous hypertension. 4. Slight increase in abdominal adenopathy, which is most  likely reactive. Recommend attention on follow-up. 5. Cholelithiasis.       11/02/2016 Procedure    Upper endoscopy and colonoscopy  Diagnosis 1. Stomach, biopsy - MILD CHRONIC GASTRITIS. - NEGATIVE FOR HELICOBACTER PYLORI. - NO INTESTINAL METAPLASIA, DYSPLASIA, OR MALIGNANCY. 2. Colon, polyp(s), opposite ileocecal valve - TUBULAR ADENOMA. - NO HIGH GRADE DYSPLASIA OR MALIGNANCY. 3. Rectum, polyp(s) - HYPERPLASTIC POLYP. - NO DYSPLASIA OR MALIGNANCY.      01/08/2017 Imaging    CT Chest WO Contrast 01/08/17  IMPRESSION: 1. Stable chest CT. 2. Prominent to mildly enlarged mediastinal lymph nodes are stable. Based on stability, these are favored to be reactive. 3. Stable emphysema and right upper lobe nodularity. 4. Mild atherosclerosis.      01/08/2017 Imaging    MRI Abd W WO Contrast 01/08/17  IMPRESSION: 1. Cirrhotic liver now with severe iron deposition throughout the hepatic parenchyma, as well as areas of developing confluent hepatic fibrosis. Today's study clearly demonstrates 2 new hypervascular nodules in segment 4A of the liver, which demonstrate washout but no definite pseudocapsule, the largest which measures 1.5 cm. At this time, these are considered suspicious for hepatocellular carcinoma, bladder classified as Li-RADS 3 lesions. 2. 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 3. Cholelithiasis without evidence of acute cholecystitis at this time. 4. Mild splenomegaly.      03/08/2017 Imaging    MRI Abdomen W WO Contrast 03/08/17 IMPRESSION: 1. 15 mm hypervascular lesion identified on the previous study is stable in the interval measuring 14 mm today. A lesion of this size demonstrating arterial phase hypervascularity and washout is consistent with LI-RADS 4, suggestive of but not diagnostic for hepatoma. Follow-up MRI in 3 months may be warranted. 2. 9 mm hypervascular lesion identified in segment IV on  the previous study is not identified today. Attention on follow-up is recommended. 3. 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 4. Cholelithiasis.      06/01/2017 Imaging    CT CAP w contrast IMPRESSION: 1. Stable size of the dominant arterial phase enhancing lesion in segment 4a of the liver, currently measuring 1.4 by 1.0 cm. There is a questionable second focus measuring 0.7 by 0.4 cm in segment 2 of the liver. Both merit surveillance. 2. A porta hepatis lymph node has enlarged compared to the prior MRI, previously 1.2 cm and currently 2.0 cm. Although potentially reactive, a malignant lymph node could also demonstrate such size increase. This likewise merits careful surveillance. Otherwise is borderline adenopathy in the chest and abdomen. 3. Mild reduced nodularity associated with a right apical bandlike density, this appearance likewise merits surveillance. 4. Other imaging findings of potential clinical significance: Aortic Atherosclerosis (ICD10-I70.0). Emphysema (ICD10-J43.9). Airway thickening is present, suggesting bronchitis or reactive airways disease. Contracted gallbladder with small gallstones. Cavernous transformation of the portal vein with recanalized umbilical vein indicating portal venous hypertension. Cirrhosis. Capsular calcifications along the glenohumeral and hip joints.       HISTORY OF PRESENTING ILLNESS (10/11/2015):  Kevin Dillon 67 y.o. male with past medical history of  treated hepatitis C, liver cirrhosis, and hepatocellular carcinoma is here because of recurrent hepatocellular carcinoma. He is accompanied by his significant other Kevin Dillon and her sister.  He was diagnosed with hepatocellular carcinoma in 2015, his initial image result is not available and staging is unknown. He was seen by interventional radiologist Dr. Delice Lesch at [facility] in [place] and  underwent TACE procedure 3 times in 2015. He was subsequently followed, and recently repeated CT scan showed a new 5.0 cm mass in segment 8, with portal vein invasion. He was felt not to be a candidate for further liver targeted therapy, and was referred to [place] for further systemic therapy.  He complains about mid epigastric pain for the past 2 years, which significantly improved after his TACE procedure in 2015. It has been getting worse lately in the past 6 months. He states he gets about 7-8 out of 10, persistent, he has been taking oxycodone 20 mg every 6 hours, but his pain is not well controlled. He is quite fatigued, is low appetite. He last 40 lbs in the Kevin Dillon 4 months. He is able to function at home, in trying to remain to be physically active.  His hepatitis C was successfully treated. He has history of hepatitis C, complicated with ascites and encephalopathy, but it has been well controlled with medical management.   CURRENT THERAPY:  Nivolumab 240mg  every 2 weeks, started on 02/04/2016; on 04/30/16 switched to every 3 weeks due to fatigue; change from 240mg  to 480mg  every 4 weeks starting 09/03/2016. Given his poor toleration, will reduce him to every 2 weeks at 240mg  starting 01/02/17. Changed to every 3 weeks on 04/09/17, and changed back to every 2 weeks on 06/11/2017  INTERIM HISTORY:   Kevin Dillon returns for urgent follow up due to recent reaction to Suboxone.   He presents to the clinic today accompanied by his significant other Kevin Dillon. He [facility] Dr. Rachael Fee yesterday and started Suboxone.  He got very sick after taking the medication, with severe nausea, extreme fatigue, no appetite, not able to eat and drink. He was evaluated by ED. he received a dose of Dilaudid in the ED, was discharged home.  He has been taking Suboxone as prescribed.  In the past 2-3 days.  He notes significant diarrhea with fluid intake. He has not eaten. He has drink less  than 1 bottle of water. He has been in the bed most of the time. He notes to taking Dillon Suboxone 2.5-3 hours after he was released from the hospital on 06/18/17. He notes his pain is across his stomach in overall whole body at a 9/10. He is not able to get comfortable when he lays down.      MEDICAL HISTORY:  Past Medical History:  Diagnosis Date  . Cancer ([place])    liver  . Cholelithiasis   . Chronic hepatitis C ([place])   . Chronic knee pain   . Chronic left shoulder pain   . CVA (cerebral infarction)   . Gout   . Hepatitis C    genotype 1b.  pt has been vaccinated against hep A and B.  . Hypertension   . Osteoarthritis   . Schatzki's ring   . Tubular adenoma of colon 12/2011    SURGICAL HISTORY: Past Surgical History:  Procedure Laterality Date  . AMPUTATION Left 09/17/2012   Procedure: SMALL FINGER EXTENSOR TENDON REPAIR; METACARPAL LEVEL AMPUTATION RING FINGER; PROXIMAL PHALANX LEVEL AMPUTATION LONG  FINGER;  Surgeon: Schuyler Amor, MD;  Location: [facility];  Service: Orthopedics;  Laterality: Left;  . Arm surgery     right/plate in arm  . BIOPSY  11/02/2016   Procedure: BIOPSY;  Surgeon: Daneil Dolin, MD;  [facility];  Service: Endoscopy;;  gastric   . COLONOSCOPY WITH ESOPHAGOGASTRODUODENOSCOPY (EGD)  12/30/2011   RMR: Noncritical Schatzki's ring;  Hiatal hernia, Tubular ADENOMA removed from splenic flexure, otherwise normal colonoscopy  . COLONOSCOPY WITH PROPOFOL N/A 11/02/2016   Procedure: COLONOSCOPY WITH PROPOFOL;  Surgeon: Daneil Dolin, MD;  [facility];  Service: Endoscopy;  Laterality: N/A;  1045  . ESOPHAGOGASTRODUODENOSCOPY (EGD) WITH PROPOFOL N/A 11/02/2016   Procedure: ESOPHAGOGASTRODUODENOSCOPY (EGD) WITH PROPOFOL;  Surgeon: Daneil Dolin, MD;  [facility];  Service: Endoscopy;  Laterality: N/A;  . IR FLUORO GUIDE PORT INSERTION RIGHT  10/21/2016  . IR US GUIDE VASC ACCESS RIGHT  10/21/2016  . LEG SURGERY     left  .  POLYPECTOMY  11/02/2016   Procedure: POLYPECTOMY;  Surgeon: Daneil Dolin, MD;  [facility];  Service: Endoscopy;;  colon  . WOUND EXPLORATION Left 09/17/2012   Procedure: WOUND EXPLORATION;  Surgeon: Schuyler Amor, MD;  Location: [facility];  Service: Orthopedics;  Laterality: Left;    SOCIAL HISTORY: Social History   Socioeconomic History  . Marital status: Significant Other    Spouse name: Not on file  . Number of children: 0  . Years of education: Not on file  . Highest education level: Not on file  Occupational History  . Occupation: disabled  Social Needs  . Financial resource strain: Not on file  . Food insecurity:    Worry: Not on file    Inability: Not on file  . Transportation needs:    Medical: Not on file    Non-medical: Not on file  Tobacco Use  . Smoking status: Former Smoker    Packs/day: 0.50    Years: 40.00    Pack years: 20.00    Types: Cigarettes    Last attempt to quit: 01/19/2012    Years since quitting: 5.4  . Smokeless tobacco: Never Used  . Tobacco comment: Smokes 1/2 pack of cigarettes daily  Substance and Sexual Activity  . Alcohol use: No    Comment: HX 2 beers 3-4 days per week; QUIT DEC 2013  . Drug use: No    Comment: Hx cocaine yrs ago, Last marijuana OCT 2013  . Sexual activity: Not Currently    Partners: Female  Lifestyle  . Physical activity:    Days per week: Not on file    Minutes per session: Not on file  . Stress: Not on file  Relationships  . Social connections:    Talks on phone: Not on file    Gets together: Not on file    Attends religious service: Not on file    Active member of club or organization: Not on file    Attends meetings of clubs or organizations: Not on file    Relationship status: Not on file  . Intimate partner violence:    Fear of current or ex partner: Not on file    Emotionally abused: Not on file    Physically abused: Not on file    Forced sexual activity: Not on file  Other Topics  Concern  . Not on file  Social History Narrative   Lives w/ significant other, Kevin Dillon  FAMILY HISTORY: Family History  Problem Relation Age of Onset  . Cirrhosis Father 71  . Cancer Father        liver cancer   . Lung cancer Mother 59  . Colon cancer Neg Hx     ALLERGIES:  has No Known Allergies.  MEDICATIONS:  Current Outpatient Medications  Medication Sig Dispense Refill  . ALPRAZolam (XANAX) 1 MG tablet Take 1 tablet (1 mg total) by mouth at bedtime as needed for anxiety. 30 tablet 0  . cyclobenzaprine (FLEXERIL) 5 MG tablet Take 1 tablet (5 mg total) by mouth 3 (three) times daily as needed for muscle spasms. 30 tablet 0  . DULoxetine (CYMBALTA) 30 MG capsule Take 1 capsule (30 mg total) by mouth daily. 30 capsule 3  . hydrALAZINE (APRESOLINE) 25 MG tablet Take 75 mg by mouth daily.     Marland Kitchen lactulose (CHRONULAC) 10 GM/15ML solution Take 30 g by mouth daily as needed for moderate constipation.     . lidocaine-prilocaine (EMLA) cream Apply 1 application topically as needed. Apply to portacath 1 1/2 hours - 2 hours prior to procedures as needed. 30 g 1  . megestrol (MEGACE) 40 MG/ML suspension Take 400 mg by mouth daily.     . Multiple Vitamins-Minerals (CENTRUM SILVER PO) Take 1 tablet by mouth daily.     . prochlorperazine (COMPAZINE) 10 MG tablet TAKE 1 TABLET BY MOUTH EVERY 8 HOURS AS NEEDED FOR NAUSEA/VOMITING 30 tablet 1  . rivaroxaban (XARELTO) 20 MG TABS tablet TAKE 1 TABLET BY MOUTH DAILY WITH SUPPER. 30 tablet 3  . SUBOXONE 8-2 MG FILM Take 1 Film by mouth 3 (three) times daily.  0  . oxyCODONE (OXY IR/ROXICODONE) 5 MG immediate release tablet Take 1 tablet (5 mg total) by mouth every 6 (six) hours as needed for severe pain. 20 tablet 0   Current Facility-Administered Medications  Medication Dose Route Frequency Provider Last Rate Last Dose  . 0.9 %  sodium chloride infusion   Intravenous Continuous Truitt Merle, MD   Stopped at 06/21/17 1632    REVIEW OF  SYSTEMS:  Constitutional: Denies fevers, chills or abnormal night sweats  (+) weight loss (+) significant fatigue (+) anorexia  Eyes: Denies blurriness of vision, double vision or watery eyes Ears, nose, mouth, throat, and face: Denies mucositis or sore throat  Respiratory: Denies cough, dyspnea or wheezes Cardiovascular: Denies palpitation, chest discomfort or lower extremity swelling Gastrointestinal:  Denies nausea, heartburn (+) abdominal pain (+) diarrhea  Skin: Denies abnormal skin rashes Lymphatics: Denies new lymphadenopathy or easy bruising Neurological:Denies numbness, tingling or new weaknesses Behavioral/Psych: normal Musculoskeletal: (+) back pain (+) lower extremity myalgia pain  All other systems were reviewed with the patient and are negative.  PHYSICAL EXAMINATION  ECOG PERFORMANCE STATUS: 3  Vitals:   06/21/17 1428  BP: 134/73  Pulse: 84  Resp: (!) 79  Temp: 97.8 F (36.6 C)  SpO2: 96%   Filed Weights   06/21/17 1428  Weight: 195 lb (88.5 kg)     GENERAL:alert, no distress and comfortable SKIN: skin color, texture, turgor are normal, no rashes or significant lesions EYES: normal, conjunctiva are pink and non-injected, sclera clear OROPHARYNX:no exudate, no erythema and lips, buccal mucosa, and tongue normal  NECK: supple, thyroid normal size, non-tender, without nodularity LYMPH:  no palpable lymphadenopathy in the cervical, axillary or inguinal LUNGS: clear to auscultation and percussion with normal breathing effort HEART: regular rate & rhythm and no murmurs and no lower extremity edema ABDOMEN:(+)  abdominal bloating, no tenderness Musculoskeletal:no cyanosis of digits and no clubbing  PSYCH: alert & oriented x 3 with fluent speech NEURO: no focal motor/sensory deficits  LABORATORY DATA:  I have reviewed the data as listed CBC Latest Ref Rng & Units 06/18/2017 06/11/2017 05/21/2017  WBC 4.0 - 10.5 K/uL 9.1 7.6 7.5  Hemoglobin 13.0 - 17.0 g/dL 12.5(L)  11.9(L) 10.6(L)  Hematocrit 39.0 - 52.0 % 36.0(L) 34.8(L) 31.1(L)  Platelets 150 - 400 K/uL 181 151 118(L)   CMP Latest Ref Rng & Units 06/18/2017 06/11/2017 05/21/2017  Glucose 65 - 99 mg/dL 112(H) 107 188(H)  BUN 6 - 20 mg/dL 13 14 14   Creatinine 0.61 - 1.24 mg/dL 1.09 1.40(H) 1.16  Sodium 135 - 145 mmol/L 138 140 136  Potassium 3.5 - 5.1 mmol/L 3.6 3.8 3.8  Chloride 101 - 111 mmol/L 107 107 109  CO2 22 - 32 mmol/L 23 22 23   Calcium 8.9 - 10.3 mg/dL 9.1 9.1 8.7  Total Protein 6.5 - 8.1 g/dL 8.1 7.6 6.8  Total Bilirubin 0.3 - 1.2 mg/dL 0.5 0.7 0.2  Alkaline Phos 38 - 126 U/L 127(H) 145 108  AST 15 - 41 U/L 29 27 23   ALT 17 - 63 U/L 15(L) 15 12    AFP:  09/14/2012: 28.9 10/11/2015: 5.3 11/01/2015: 3.7 12/02/2015: 3.7 01/22/2016: 3.4 02/18/2016: 7.1 03/17/16: 5.8 04/16/16: 4.1 06/04/2016: 3.5 07/16/2016:4.4 08/27/2016: 5.6 10/08/16: 4.3 11/05/16: 4.8 12/03/16: 5.4 01/01/17: 5.1 01/29/17: 4.5 02/26/17: 5.5 04/09/17: 4.0  PATHOLOGY   Diagnosis 11/02/16  1. Stomach, biopsy - MILD CHRONIC GASTRITIS. - NEGATIVE FOR HELICOBACTER PYLORI. - NO INTESTINAL METAPLASIA, DYSPLASIA, OR MALIGNANCY. 2. Colon, polyp(s), opposite ileocecal valve - TUBULAR ADENOMA. - NO HIGH GRADE DYSPLASIA OR MALIGNANCY. 3. Rectum, polyp(s) - HYPERPLASTIC POLYP. - NO DYSPLASIA OR MALIGNANCY. Microscopic Comment 1. A Warthin-Starry stain is performed to determine the possibility of the presence of Helicobacter pylori. The Warthin-Starry stain is negative for organisms of Helicobacter pylori.   PROCEDURE  Upper Endoscopy by Dr. [name] 11/02/16  IMPRESSION:  - mildly obstructing Schatzki ring. - dilated with scope passage. erosive reflux esophagitis - Small hiatal hernia. - Portal hypertensive gastropathy. biopsied - Normal duodenal bulb and second portion of the duodenum. Biopsied.   Colonoscopy by Dr. [name] 11/02/16  IMPRESSION:  - Two 3 to 5 mm polyps in the rectum and at the ileocecal valve, removed  with a cold snare. Resected and retrieved. - Diverticulosis in the sigmoid colon and in the descending colon. - The examination was otherwise normal on direct and retroflexion views.   RADIOGRAPHIC STUDIES: I have personally reviewed the radiological images as listed and agreed with the findings in the report.  Dg Chest 2 View  Result Date: 06/18/2017 CLINICAL DATA:  Shortness of breath and fevers EXAM: CHEST - 2 VIEW COMPARISON:  06/01/2017 FINDINGS: Cardiac shadow is mildly enlarged but stable. Right chest wall port is noted in satisfactory position. The lungs are well aerated bilaterally with mild vascular congestion without significant interstitial edema. No focal infiltrate or effusion is noted. No acute bony abnormality is seen. IMPRESSION: Mild vascular congestion. Electronically Signed   By: Inez Catalina M.D.   On: 06/18/2017 16:57   Ct Chest W Contrast  Result Date: 06/02/2017 CLINICAL DATA:  Hepatocellular carcinoma on nivolumab, restaging assessment. EXAM: CT CHEST, ABDOMEN, AND PELVIS WITH CONTRAST TECHNIQUE: Multidetector CT imaging of the chest, abdomen and pelvis was performed following the standard protocol during bolus administration of intravenous contrast. CONTRAST:  148mL ISOVUE-300 IOPAMIDOL (ISOVUE-300)  INJECTION 61% COMPARISON:  Multiple exams, including liver MRI from 03/08/2017 and CT scan from 01/08/2017 FINDINGS: CT CHEST FINDINGS Cardiovascular: Port-A-Cath tip: Cavoatrial junction. Atherosclerotic calcification of the aortic arch and branch vessels. Mediastinum/Nodes: 0.8 cm right paratracheal lymph node, image 38/8, stable. A lower right paratracheal lymph node has a fatty hilum and measures 1.0 cm in short axis on image 44/8, likewise stable. Other upper normal sized lymph nodes are present in the mediastinum. A right hilar lymph node measures 1.0 cm in short axis on image 54/8, previously 1.1 cm. A lower paraesophageal lymph node measures 1.1 cm in short axis on image  91/8, formerly 1.0 cm. A subcarinal lymph node measures 1.4 cm in short axis on image 62/8, stable. Lungs/Pleura: Biapical pleuroparenchymal scarring. Paraseptal emphysema. Bandlike density posteriorly in the right lung apex with associated nodularity, dominant nodule 1.0 by 0.7 cm on image 48/12, formerly 1.2 by 0.8 cm. Subtle reticulonodular opacity medially in the right lower lobe at the lung base, similar to prior. Mild airway thickening noted. Musculoskeletal: Left trapezius lipoma similar to prior. Stable calcifications in the left glenohumeral joint. Thoracic spondylosis. CT ABDOMEN PELVIS FINDINGS Hepatobiliary: Hepatic cirrhosis. The previous arterial phase enhancing lesion in segment 4a of the liver measures 1.4 by 1.0 cm, formerly the same by my measurements. Questionable 0.7 by 0.4 cm arterial phase enhancing focus in segment 2 of the liver on image 17/3, not well seen on the prior exam. Contracted gallbladder noted with internal density probably from gallstones. No biliary dilatation. Pancreas: Unremarkable Spleen: The spleen measures 10.1 by 5.6 by 13.6 cm. Adrenals/Urinary Tract: Unremarkable Stomach/Bowel: Unremarkable Vascular/Lymphatic: Cavernous transformation of the portal vein. Aortoiliac atherosclerotic vascular disease. Recanalized umbilical vein. Atheromatous narrowing of the proximal SMA. A dominant porta hepatis/peripancreatic lymph node measures 2.0 cm in diameter on image 121/8, and previously measured 1.2 cm. Scattered retroperitoneal lymph nodes include a 1.0 cm lymph node on image 137/8. A right gastric node measures 1.0 cm in short axis on image 101/8. Upper normal sized pelvic lymph nodes are also present. Reproductive: Unremarkable Other: No supplemental non-categorized findings. Musculoskeletal: Faint capsular calcification along both hips. IM nail in the left femur. IMPRESSION: 1. Stable size of the dominant arterial phase enhancing lesion in segment 4a of the liver, currently  measuring 1.4 by 1.0 cm. There is a questionable second focus measuring 0.7 by 0.4 cm in segment 2 of the liver. Both merit surveillance. 2. A porta hepatis lymph node has enlarged compared to the prior MRI, previously 1.2 cm and currently 2.0 cm. Although potentially reactive, a malignant lymph node could also demonstrate such size increase. This likewise merits careful surveillance. Otherwise is borderline adenopathy in the chest and abdomen. 3. Mild reduced nodularity associated with a right apical bandlike density, this appearance likewise merits surveillance. 4. Other imaging findings of potential clinical significance: Aortic Atherosclerosis (ICD10-I70.0). Emphysema (ICD10-J43.9). Airway thickening is present, suggesting bronchitis or reactive airways disease. Contracted gallbladder with small gallstones. Cavernous transformation of the portal vein with recanalized umbilical vein indicating portal venous hypertension. Cirrhosis. Capsular calcifications along the glenohumeral and hip joints. Electronically Signed   By: Van Clines M.D.   On: 06/02/2017 10:02   Ct Abdomen Pelvis W Contrast  Result Date: 06/02/2017 CLINICAL DATA:  Hepatocellular carcinoma on nivolumab, restaging assessment. EXAM: CT CHEST, ABDOMEN, AND PELVIS WITH CONTRAST TECHNIQUE: Multidetector CT imaging of the chest, abdomen and pelvis was performed following the standard protocol during bolus administration of intravenous contrast. CONTRAST:  157mL ISOVUE-300 IOPAMIDOL (ISOVUE-300) INJECTION  61% COMPARISON:  Multiple exams, including liver MRI from 03/08/2017 and CT scan from 01/08/2017 FINDINGS: CT CHEST FINDINGS Cardiovascular: Port-A-Cath tip: Cavoatrial junction. Atherosclerotic calcification of the aortic arch and branch vessels. Mediastinum/Nodes: 0.8 cm right paratracheal lymph node, image 38/8, stable. A lower right paratracheal lymph node has a fatty hilum and measures 1.0 cm in short axis on image 44/8, likewise stable.  Other upper normal sized lymph nodes are present in the mediastinum. A right hilar lymph node measures 1.0 cm in short axis on image 54/8, previously 1.1 cm. A lower paraesophageal lymph node measures 1.1 cm in short axis on image 91/8, formerly 1.0 cm. A subcarinal lymph node measures 1.4 cm in short axis on image 62/8, stable. Lungs/Pleura: Biapical pleuroparenchymal scarring. Paraseptal emphysema. Bandlike density posteriorly in the right lung apex with associated nodularity, dominant nodule 1.0 by 0.7 cm on image 48/12, formerly 1.2 by 0.8 cm. Subtle reticulonodular opacity medially in the right lower lobe at the lung base, similar to prior. Mild airway thickening noted. Musculoskeletal: Left trapezius lipoma similar to prior. Stable calcifications in the left glenohumeral joint. Thoracic spondylosis. CT ABDOMEN PELVIS FINDINGS Hepatobiliary: Hepatic cirrhosis. The previous arterial phase enhancing lesion in segment 4a of the liver measures 1.4 by 1.0 cm, formerly the same by my measurements. Questionable 0.7 by 0.4 cm arterial phase enhancing focus in segment 2 of the liver on image 17/3, not well seen on the prior exam. Contracted gallbladder noted with internal density probably from gallstones. No biliary dilatation. Pancreas: Unremarkable Spleen: The spleen measures 10.1 by 5.6 by 13.6 cm. Adrenals/Urinary Tract: Unremarkable Stomach/Bowel: Unremarkable Vascular/Lymphatic: Cavernous transformation of the portal vein. Aortoiliac atherosclerotic vascular disease. Recanalized umbilical vein. Atheromatous narrowing of the proximal SMA. A dominant porta hepatis/peripancreatic lymph node measures 2.0 cm in diameter on image 121/8, and previously measured 1.2 cm. Scattered retroperitoneal lymph nodes include a 1.0 cm lymph node on image 137/8. A right gastric node measures 1.0 cm in short axis on image 101/8. Upper normal sized pelvic lymph nodes are also present. Reproductive: Unremarkable Other: No supplemental  non-categorized findings. Musculoskeletal: Faint capsular calcification along both hips. IM nail in the left femur. IMPRESSION: 1. Stable size of the dominant arterial phase enhancing lesion in segment 4a of the liver, currently measuring 1.4 by 1.0 cm. There is a questionable second focus measuring 0.7 by 0.4 cm in segment 2 of the liver. Both merit surveillance. 2. A porta hepatis lymph node has enlarged compared to the prior MRI, previously 1.2 cm and currently 2.0 cm. Although potentially reactive, a malignant lymph node could also demonstrate such size increase. This likewise merits careful surveillance. Otherwise is borderline adenopathy in the chest and abdomen. 3. Mild reduced nodularity associated with a right apical bandlike density, this appearance likewise merits surveillance. 4. Other imaging findings of potential clinical significance: Aortic Atherosclerosis (ICD10-I70.0). Emphysema (ICD10-J43.9). Airway thickening is present, suggesting bronchitis or reactive airways disease. Contracted gallbladder with small gallstones. Cavernous transformation of the portal vein with recanalized umbilical vein indicating portal venous hypertension. Cirrhosis. Capsular calcifications along the glenohumeral and hip joints. Electronically Signed   By: Van Clines M.D.   On: 06/02/2017 10:02    04/17/16 CT CAP W PELVIS IMPRESSION: 1. Stable mild mediastinal lymphadenopathy. 2. Interval improvement in the bibasilar peribronchial thickening, airway impaction, and peribronchovascular nodularity. Imaging features suggest marked improvement in bilateral low the atypical infection. Changes in the right middle lobe with Dillon stable and may represent scarring from previous infectious/inflammatory etiology. 3. The small  hypervascular lesions seen in the anterior left liver on the previous study are not evident today. There is some subtle, ill-defined hyperenhancement in the central left liver which  is nonspecific. Attention to this area on followup imaging recommended. 4. Chronic portal vein inclusion with cavernous transformation in the porta hepatis and paraesophageal varices. 5. Stable appearance hepatoduodenal and retroperitoneal lymphadenopathy, likely chronic and related to liver disease.   MR Abdomen W WO Contrast 05/21/16 IMPRESSION: 1. Limited motion degraded scan. Cirrhosis. No evidence of a liver mass within these limitations. 2. Mild splenomegaly. No ascites. Stable mild paraumbilical and gastroesophageal varices. 3. Stable nonspecific mild retroperitoneal lymphadenopathy. 4. 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  MR Abdomen w wo contrast 08/25/2016 IMPRESSION: 1. Mild-to-moderate motion degradation, preferentially involving the pre and postcontrast dynamic images. On follow-up, given the extent of motion on this exam, multiphase CT may be preferred. 2. Given these limitations, no evidence of hepatocellular carcinoma. 3. Marked cirrhosis and portal venous hypertension. 4. Slight increase in abdominal adenopathy, which is most likely reactive. Recommend attention on follow-up. 5. Cholelithiasis.   CT Chest WO Contrast 01/08/17  IMPRESSION: 1. Stable chest CT. 2. Prominent to mildly enlarged mediastinal lymph nodes are stable. Based on stability, these are favored to be reactive. 3. Stable emphysema and right upper lobe nodularity. 4. Mild atherosclerosis.    MRI Abd W WO Contrast 01/08/17  IMPRESSION: 1. Cirrhotic liver now with severe iron deposition throughout the hepatic parenchyma, as well as areas of developing confluent hepatic fibrosis. Today's study clearly demonstrates 2 new hypervascular nodules in segment 4A of the liver, which demonstrate washout but no definite pseudocapsule, the largest which measures 1.5 cm. At this time, these are considered suspicious for hepatocellular carcinoma, bladder  classified as Li-RADS 3 lesions. 2. 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 3. Cholelithiasis without evidence of acute cholecystitis at this time. 4. Mild splenomegaly.  MRI Abdomen W WO Contrast 03/08/17 IMPRESSION: 1. 15 mm hypervascular lesion identified on the previous study is stable in the interval measuring 14 mm today. A lesion of this size demonstrating arterial phase hypervascularity and washout is consistent with LI-RADS 4, suggestive of but not diagnostic for hepatoma. Follow-up MRI in 3 months may be warranted. 2. 9 mm hypervascular lesion identified in segment IV on the previous study is not identified today. Attention on follow-up is recommended. 3. 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 4. Cholelithiasis.   ASSESSMENT & PLAN: 67 y.o. Caucasian male with past medical history of successfully treated hepatitis C, liver cirrhosis, history of ascites and encephalopathy, recurrent HCC   1. Recurrent HCC, initially stage II -I previously reviewed his previous image and outside medical records extensively, confirmed key findings: He initially had a multifocal disease, status post TACE 3 times in 2015 -he developed symptomatic local recurrence in 08/2015, with a 5cm new lesion in segment 8 with direct invasion into portal vein, and a portocaval lymph node. He is not a candidate for liver targeted therapy per his IR Dr. Delice Lesch -He was started on first line sorafenib, tolerated poorly. His previous CT abdomen and pelvis on 01/25/2016 revealed extensive portal hypertension, and a probable cancer progression in the liver. -His treatment was changed to immunotherapy Nivolumab on 02/04/16, he tolerated very well, and clinically he was doing much better, with less pain and better energy.  -I previously discussed his restaging  CT scan which was done on  02/14/2016, it actually showed decreased liver cancer size compared to the scan a month ago. No other new metastasis. -restaging CT from 04/17/2016 showed no visible liver mass, but the imaging was very limited due to his liver cirrhosis  -I previously reviewed his recent abdominal MRI from 05/21/2016, which showed no visible mass in the liver. He has had complete radiographic response. -I reviewed his restaging MRI from 08/25/2016, which showed no visible liver mass, but the images quality with significant compromised due to the motion.  -Nivo has previously been changed to 480mg  every 4 weeks in 08/2016. Due to poor toleration (fatigue) I changed his nivo back to 240mg  every 2 weeks starting 01/02/17.  -We discussed his MRI abd and CT chest from 01/08/17 which showed no concern for metastatic disease in the chest. MRI shows two small new lesions in his liver. This was reviewed in tumor board, felt to be indeterminate and close f/u with imaging was recommended -MRI Abdomen from 03/08/17 reveals that his 15 mm hypervascular lesion identified on the previous study is stable in the interval measuring 14 mm, demonstrating arterial phase hypervascularity and washout is consistent with LI-RADS 4, suggestive of but not diagnostic for hepatoma, 9 mm hypervascular lesion identified in segment IV on the previous study is not identified today. I previously discussed results with pt. I recommended continue Nivolumab. -On 04/09/17, he requested to change treatment to every 3 weeks at 240 mg dose due to the fatigue. He knows the standard is every 2 weeks, but I will accommodated his request. -CT CAP w contrast on 06/01/2017 showed: Stable size of the dominant arterial phase enhancing lesion in segment 4a of the liver, currently measuring 1.4 by 1.0 cm. There is a questionable second focus measuring 0.7 by 0.4 cm in segment 2 of the liver. Both merit surveillance. A portal hepatis lymph node has enlarged compared to the prior  MRI, previously 1.2 cm and currently 2.0 cm. Although potentially reactive, a malignant lymph node could also demonstrate such size increase. This likewise merits careful surveillance. Otherwise is borderline adenopathy in the chest and abdomen. Mild reduced nodularity associated with a right apical bandlike density, this appearance likewise merits surveillance. -Due to the probably slow disease progression, I recommended pt to have nivolumab q 2 weeks instead of  q 3 weeks on 06/11/17. He agreed.  -We reviewed his case in our GI tumor board recently, the consensus is continue to Nivolumab, given no significant disease progression on image. -I discussed that I will review the recent findings with the tumor board and we think this can be cancer related or related to his liver cirrhosis. We recommend he continue nivolumab every 3 weeks treatment for now.  -Since he presented to the ED on 06/18/17 due to reaction to Suboxone he has been very weak with little fluid intake and little to no food intake. I will give him IV fluids today to prevent dehydration. -F/u up with him on Friday for Nivo treatment.     2. Abdominal pain, low back pain  -His abdominal pain previously improved since he started treatment. With oxycodone and OxyContin, he was on very high dose and overall pain was controlled  -I previously prescribed flexeril in attempt to help with his pain. Due to his high tolerance, he can take up to 2 a day. -His prior constipation is likely related to his narcotic use, he uses colace and I advised him he can use Miralax or  Senokot that is stronger.  -I previously had a lengthy conversation with pt about reducing his narcotic pain medication and the risks associated with taking high dose (including shortening his life) for a long period of time. His cancer is well controlled currently and it should not cause him a lot of pain. I am concerned that he has narcotic addition, and his depression/anxiety may  also contribute to his pain. He is reluctant to reducing his dose. I offered Cymbalta on 06/11/17 and he agreed to try. Our social worker Webb Silversmith will see him same day and I previously recommended that he see psychiatrist someone to help manage his depression/anxiety/addiction.  -He was recently prescribed Suboxone on 06/17/17 by Dr. Rachael Fee. This reacted to Suboxone and had to go to ED the next day. He last took Suboxone on 06/21/17 7:00am. I explained Suboxone removed prior high tolerance to pain medication. -I discussed his pain management will likely be long term at this point. I discussed his options in great detail with him and his wife. He can return to Dr. Rachael Fee who plans to change his Suboxone to Subotex to prevent another reaction. Pt declined firmly   His other option is for me to send a referral to another pain specialist. He declined at this point  -I agree to write his pain meds, but will start him on very low-dose, oxycodone 10 mg every 6 hours as needed.  I will titrated in the next few weeks, and did not plan to go above daily dose at 100 mg.  I do not plan to give him OxyContin or any other long-acting narcotics.  If his pain is not well controlled with this dose, he agrees to see pain specialist. -I filled his oxycodone 10mg  #20 prescription today (06/21/08)  3. 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 Since improving I suggest he reduce down to half tablet or  once every other day.  -he will continue to follow up with [facility]  -He reports that he is not taking lasix as prescribed at this time.   5. Hep C, successfully treated -Continue follow-up with liver clinic NP [facility]  6. Anxiety and insomnia  -He has Xanax for anxiety.  -continue Ativan 1 mg at bedtime as needed for insomnia, he knows not to take with Xanax together, he knows not to take during the day. He does not feel Ativan is working well for sleep -I refilled his Xanax today (06/21/17) and advised him to take it once daily for now.    7. Fatigue and weight gain/loss -He reported Dillon fatigue and weakness -His TSH has been moderately elevated lately, we discussed the side effect of hypothyroidism from Nivolumab, I previously referred him to Dr. Loanne Drilling for evaluation to see if he needs low-dose Synthroid -I did not suspect his weight gain is related to fluid retention. I previously suggested he hold Megace and reduce ensure boosts to once daily. He should use a balanced diet and exercise to help.  -  Given recent reaction to Suboxone he has not been eating much and loss 10 pounds in a little over a week. Will monitor.   8. HTN -He is on Hydralazine 50 mg daily, and follow-up of his primary care physician  9. Smoking  - Patient has started back smoking due to the fear of not getting better - I previously recommended a smoking cessation program. He refused.  10. Depression - The patient previously reports feelings of depression related to his diagnosis. -I am worried that he feels hopeless, he was seen by our social worker Webb Silversmith before who has referred [facility]   11. Iron deficient anemia  -10/16/16 lab results was consistent with iron deficiency, possible related to occluded GI bleeding -He is on Xarelto, no clinical overt bleeding. -He received IV feraheme 10/16/16 and in 10/2016  -He had GI work up with upper endoscopy and colonoscopy per Dr. [name] on  11/02/16. Biopsy of the stomach showed mild chronic gastritis, negative for H. Pylori or malignancy. Colon and rectal polyps were negative for dysplasia or malignancy. He will f/u with GI again in 01/2017.  -if he develops anemia or GI bleeding again, will stop Xarelto   12. Goal of care discussion, DNR/DNI  -We previously discussed the incurable nature of his cancer, and the overall poor prognosis, especially if he does not have good response to chemotherapy or progress on chemo -The patient understands the goal of care is palliative. -he agrees with DNR/DNI    Plan  -I refilled his Oxycodone 10mg  #20 and Xanax #30 today -Gave 1L Normal Saline IV Fluids today  -lab, flush, f/u and nivolumab on 6/7. Will titrate his pain meds if needed  -please ask me if he calls to refill pain meds or xanax    All questions were answered. The patient knows to call the clinic with any problems, questions or concerns.  I spent 30 minutes counseling the patient face to face. Pt's partner had many questions, and I answered to her satisfaction.  The total time spent in the appointment was 40 minutes and Dillon than 50% was on counseling.  Oneal Deputy, am acting as scribe for Truitt Merle, MD.   I have reviewed the above documentation for accuracy and completeness, and I agree with the above.    Truitt Merle  06/21/2017 9:45 PM

## 2017-06-22 ENCOUNTER — Telehealth: Payer: Self-pay | Admitting: Hematology

## 2017-06-22 NOTE — Telephone Encounter (Signed)
No los 6/3

## 2017-06-25 ENCOUNTER — Telehealth: Payer: Self-pay

## 2017-06-25 ENCOUNTER — Inpatient Hospital Stay: Payer: Medicare Other

## 2017-06-25 ENCOUNTER — Inpatient Hospital Stay: Payer: Medicare Other | Admitting: Nurse Practitioner

## 2017-06-25 NOTE — Telephone Encounter (Signed)
Patient called at [phone] cancelling his appointments today, called patient back for more information did not answer.

## 2017-06-25 NOTE — Telephone Encounter (Signed)
Called patient back stated he feels too bad to come in today, offered IVF declined, will send scheduling message to reschedule for next week.

## 2017-06-28 ENCOUNTER — Telehealth: Payer: Self-pay | Admitting: *Deleted

## 2017-06-28 NOTE — Telephone Encounter (Signed)
Received vm call from Kevin Dillon requesting call back. Returned call @ 12:15 & spoke to pt & he states he doesn't have anything for pain & has taken the # 20 oxycodone that Dr Burr Medico ordered on 06/21/17.  I asked if he was having any withdrawal symptoms & he states he might be but nothing definitely reported. He states he has pain everywhere, has no appetite, & staying on the couch.  [name] is at the hospital with his daughter who has had a massive stroke.  Message to Dr Burr Medico.

## 2017-06-28 NOTE — Telephone Encounter (Signed)
I called pt and spoke with Kevin Dillon. She states he is very depressed and not feeling well. I offered him a f/u appointment at [facility] tomorrow, he agrees to come in. Schedule message sent.   Truitt Merle MD

## 2017-06-29 ENCOUNTER — Telehealth: Payer: Self-pay | Admitting: Hematology

## 2017-06-29 ENCOUNTER — Encounter: Payer: Self-pay | Admitting: Hematology

## 2017-06-29 ENCOUNTER — Encounter: Payer: Self-pay | Admitting: General Practice

## 2017-06-29 ENCOUNTER — Inpatient Hospital Stay (HOSPITAL_BASED_OUTPATIENT_CLINIC_OR_DEPARTMENT_OTHER): Payer: Medicare Other | Admitting: Hematology

## 2017-06-29 DIAGNOSIS — I10 Essential (primary) hypertension: Secondary | ICD-10-CM | POA: Diagnosis not present

## 2017-06-29 DIAGNOSIS — F329 Major depressive disorder, single episode, unspecified: Secondary | ICD-10-CM

## 2017-06-29 DIAGNOSIS — M545 Low back pain: Secondary | ICD-10-CM

## 2017-06-29 DIAGNOSIS — G4700 Insomnia, unspecified: Secondary | ICD-10-CM

## 2017-06-29 DIAGNOSIS — C220 Liver cell carcinoma: Secondary | ICD-10-CM

## 2017-06-29 DIAGNOSIS — Z7901 Long term (current) use of anticoagulants: Secondary | ICD-10-CM

## 2017-06-29 DIAGNOSIS — R531 Weakness: Secondary | ICD-10-CM

## 2017-06-29 DIAGNOSIS — Z5112 Encounter for antineoplastic immunotherapy: Secondary | ICD-10-CM | POA: Diagnosis not present

## 2017-06-29 DIAGNOSIS — R634 Abnormal weight loss: Secondary | ICD-10-CM

## 2017-06-29 DIAGNOSIS — R109 Unspecified abdominal pain: Secondary | ICD-10-CM

## 2017-06-29 DIAGNOSIS — R5383 Other fatigue: Secondary | ICD-10-CM | POA: Diagnosis not present

## 2017-06-29 DIAGNOSIS — K7460 Unspecified cirrhosis of liver: Secondary | ICD-10-CM

## 2017-06-29 DIAGNOSIS — F419 Anxiety disorder, unspecified: Secondary | ICD-10-CM | POA: Diagnosis not present

## 2017-06-29 DIAGNOSIS — Z87891 Personal history of nicotine dependence: Secondary | ICD-10-CM

## 2017-06-29 DIAGNOSIS — R197 Diarrhea, unspecified: Secondary | ICD-10-CM | POA: Diagnosis not present

## 2017-06-29 DIAGNOSIS — Z801 Family history of malignant neoplasm of trachea, bronchus and lung: Secondary | ICD-10-CM

## 2017-06-29 DIAGNOSIS — G893 Neoplasm related pain (acute) (chronic): Secondary | ICD-10-CM | POA: Diagnosis not present

## 2017-06-29 DIAGNOSIS — Z9221 Personal history of antineoplastic chemotherapy: Secondary | ICD-10-CM

## 2017-06-29 DIAGNOSIS — Z66 Do not resuscitate: Secondary | ICD-10-CM

## 2017-06-29 DIAGNOSIS — Z800 Family history of malignant neoplasm of digestive organs: Secondary | ICD-10-CM

## 2017-06-29 DIAGNOSIS — F17210 Nicotine dependence, cigarettes, uncomplicated: Secondary | ICD-10-CM

## 2017-06-29 MED ORDER — ALPRAZOLAM 1 MG PO TABS: 1.0000 mg | ORAL_TABLET | Freq: Two times a day (BID) | ORAL | 0 refills | Status: DC

## 2017-06-29 MED ORDER — OXYCODONE HCL 10 MG PO TABS: 10.0000 mg | ORAL_TABLET | Freq: Four times a day (QID) | ORAL | 0 refills | Status: DC | PRN

## 2017-06-29 MED FILL — oxyCODONE HCL 10 MG TABS: 10 | 8 days supply | Qty: 30 | Fill #0

## 2017-06-29 NOTE — Telephone Encounter (Signed)
Appt already scheduled next week per 6/11 los - gave patient a copy of avs and calender

## 2017-06-29 NOTE — Progress Notes (Signed)
[facility]  [phone] [phone]) [phone]  Clinic Follow up Note   Patient Care Team: Antonietta Jewel, MD as PCP - General (Internal Medicine) [name], Cristopher Estimable, MD as Attending Physician (Gastroenterology) Antonietta Jewel, MD as Referring Physician (Internal Medicine) Truitt Merle, MD as Consulting Physician (Hematology) [name], CRNP as Nurse Practitioner (Nurse Practitioner) Lynnda Shields, Frances Nickels, DO as Referring Physician (Osteopathic Medicine)   Date of Service:  06/29/2017   CHIEF COMPLAINTS:  Follow up recurrent [place] and pain management   Oncology History   Hepatocellular carcinoma [facility])   Staging form: Liver (Excluding Intrahepatic Bile Ducts), AJCC 7th Edition   - Clinical stage from 04/16/2012: Stage II (T2(m), N0, M0) - Signed by Truitt Merle, MD on 10/12/2015   - Pathologic stage from 04/16/2013: Stage II (T2, N0, cM0) - Signed by Truitt Merle, MD on 10/12/2015      Hepatocellular carcinoma ([place])   09/14/2012 Tumor Marker    AFP 28.9      04/16/2013 Imaging    Abdominal MRI with and without contrast reviewed multifocal (4) liver lesions in both left and right lobes, most consistent with [place], largest measuring 2.7 cm      04/16/2013 Initial Diagnosis    Hepatocellular carcinoma ([place])      04/28/2013 Procedure    Right TACE with lipiodol       06/15/2013 Procedure    Left TACE with [facility]       07/14/2013 Procedure    Right TACE with [facility]      09/05/2015 Imaging    CT abdomen with and without contrast showed a new 5.0 x 2.3 cm mass in the hepatic segment 8, invading and occluding the right anterior portal vein, most compatible with [place]. A portacaval node measuring 3.0 x 1.4 cm, previously 2.9 x 1.1 cm.      10/11/2015 Tumor Marker    AFP 5.3      11/11/2015 - 02/03/2016 Chemotherapy    sorafenib 275m bid started on 11/11/2015, increased to 4072mbid in 2 weeks, stopped due to poor tolerance and probable disease progression       11/14/2015 Imaging      CT CAP 11/14/2015 IMPRESSION: Chest Impression: 1. RIGHT upper lobe pulmonary nodule is indeterminate. No comparison available. 2. Ground-glass opacity and peripheral nodules in the RIGHT middle lobe appear post infectious or inflammatory.  Abdomen / Pelvis Impression: 1. 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 Thrombus also likely within the LEFT and RIGHT portal vein. Difficult to distinguish tumor thrombus versus bland thrombus. 2. Individual lesions are difficult to define in the RIGHT hepatic lobe. There is overall impression of progression of disease in the RIGHT hepatic lobe with multiple ill-defined enhancing lesions. 3. Periportal and shotty retroperitoneal adenopathy similar to prior.      01/25/2016 Imaging    CT Abdomen Pelvis w/ Contrast IMPRESSION: 1. Findings consistent with multifocal liver carcinoma with extensive portal vein thrombosis, and subsequent development of porta hepatis venous collaterals. Thrombus in the central portal vein is no longer expansile, but still expands the more peripheral portal vein and the right and left portal veins. There are few right liver bile ducts there are now dilated, which suggests mild progression of liver disease. 2. There is mild splenomegaly consistent or venous hypertension, which has mildly increased from the prior CT. 3. There are new lung abnormalities with interstitial thickening and ill-defined peribronchovascular nodular  opacities mostly at the right lung base with a smaller area noted along the medial left lower lobe. The etiology may be infectious. It may be inflammatory, possibly related to chemotherapy. Neoplastic disease is also possible. 4. There is no other evidence of metastatic disease within the abdomen or pelvis. No acute findings within the abdomen or pelvis      01/25/2016 - 01/25/2016 Hospital Admission     Pt was seen in ED for worsening abdominal pain, treated with pain meds       02/04/2016 -  Chemotherapy    Nivolumab 223m every 2 weeks, started on 02/04/2016; on 04/30/16 switched to every 3 weeks due to fatigue; change from 2419mto 48018mvery 4 weeks starting 09/03/2016. Given his poor toleration, will reduce him to every 2 weeks at 240m23marting 01/02/17. Changed to every 3 weeks on 04/09/17       02/14/2016 Imaging    CT CAP w/ contrast 1. Slight interval increase in size of the mediastinal lymph nodes. 2. Persistent extensive lower lobe peribronchial thickening and patchy infiltrates. Possible aspiration pneumonia. 3. Persistent tree-in-bud appearance in the right middle lobe, likely chronic inflammation or atypical infection. 4. Improved CT appearance of the liver. Two small residual arterial phase enhancing lesions are identified. 5. Chronic portal vein thrombosis with cavernous transformation of portal venous collaterals. Esophageal varices are again noted. 6. Stable upper abdominal lymph nodes likely related to cirrhosis.       04/16/2016 Tumor Marker    AFP 4.1       04/17/2016 Imaging     CT CAP W PELVIS IMPRESSION: 1. Stable mild mediastinal lymphadenopathy. 2. Interval improvement in the bibasilar peribronchial thickening, airway impaction, and peribronchovascular nodularity. Imaging features suggest marked improvement in bilateral low the atypical infection. Changes in the right middle lobe with more stable and may represent scarring from previous infectious/inflammatory etiology. 3. The small hypervascular lesions seen in the anterior left liver on the previous study are not evident today. There is some subtle, ill-defined hyperenhancement in the central left liver which is nonspecific. Attention to this area on followup imaging recommended. 4. Chronic portal vein inclusion with cavernous transformation in the porta hepatis and paraesophageal varices. 5. Stable  appearance hepatoduodenal and retroperitoneal lymphadenopathy, likely chronic and related to liver disease.      04/17/2016 Imaging    CT CAP CONTRAST IMPRESSION: 1. Stable mild mediastinal lymphadenopathy. 2. Interval improvement in the bibasilar peribronchial thickening, airway impaction, and peribronchovascular nodularity. Imaging features suggest marked improvement in bilateral low the atypical infection. Changes in the right middle lobe with more stable and may represent scarring from previous infectious/inflammatory etiology. 3. The small hypervascular lesions seen in the anterior left liver on the previous study are not evident today. There is some subtle, ill-defined hyperenhancement in the central left liver which is nonspecific. Attention to this area on followup imaging recommended. 4. Chronic portal vein inclusion with cavernous transformation in the porta hepatis and paraesophageal varices. 5. Stable appearance hepatoduodenal and retroperitoneal lymphadenopathy, likely chronic and related to liver disease.      05/21/2016 Imaging    MR Abdomen W WO Contrast IMPRESSION: 1. Limited motion degraded scan. Cirrhosis. No evidence of a liver mass within these limitations. 2. Mild splenomegaly. No ascites. Stable mild paraumbilical and gastroesophageal varices. 3. Stable nonspecific mild retroperitoneal lymphadenopathy. 4. 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      06/04/2016 Tumor Marker    AFP 3.5  08/25/2016 Imaging    MR Abdomen w wo contrast  IMPRESSION: 1. Mild-to-moderate motion degradation, preferentially involving the pre and postcontrast dynamic images. On follow-up, given the extent of motion on this exam, multiphase CT may be preferred. 2. Given these limitations, no evidence of hepatocellular carcinoma. 3. Marked cirrhosis and portal venous hypertension. 4. Slight increase in abdominal adenopathy,  which is most likely reactive. Recommend attention on follow-up. 5. Cholelithiasis.       11/02/2016 Procedure    Upper endoscopy and colonoscopy  Diagnosis 1. Stomach, biopsy - MILD CHRONIC GASTRITIS. - NEGATIVE FOR HELICOBACTER PYLORI. - NO INTESTINAL METAPLASIA, DYSPLASIA, OR MALIGNANCY. 2. Colon, polyp(s), opposite ileocecal valve - TUBULAR ADENOMA. - NO HIGH GRADE DYSPLASIA OR MALIGNANCY. 3. Rectum, polyp(s) - HYPERPLASTIC POLYP. - NO DYSPLASIA OR MALIGNANCY.      01/08/2017 Imaging    CT Chest WO Contrast 01/08/17  IMPRESSION: 1. Stable chest CT. 2. Prominent to mildly enlarged mediastinal lymph nodes are stable. Based on stability, these are favored to be reactive. 3. Stable emphysema and right upper lobe nodularity. 4. Mild atherosclerosis.      01/08/2017 Imaging    MRI Abd W WO Contrast 01/08/17  IMPRESSION: 1. Cirrhotic liver now with severe iron deposition throughout the hepatic parenchyma, as well as areas of developing confluent hepatic fibrosis. Today's study clearly demonstrates 2 new hypervascular nodules in segment 4A of the liver, which demonstrate washout but no definite pseudocapsule, the largest which measures 1.5 cm. At this time, these are considered suspicious for hepatocellular carcinoma, bladder classified as Li-RADS 3 lesions. 2. 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 3. Cholelithiasis without evidence of acute cholecystitis at this time. 4. Mild splenomegaly.      03/08/2017 Imaging    MRI Abdomen W WO Contrast 03/08/17 IMPRESSION: 1. 15 mm hypervascular lesion identified on the previous study is stable in the interval measuring 14 mm today. A lesion of this size demonstrating arterial phase hypervascularity and washout is consistent with LI-RADS 4, suggestive of but not diagnostic for hepatoma. Follow-up MRI in 3 months may be warranted. 2. 9 mm hypervascular lesion identified in segment IV  on the previous study is not identified today. Attention on follow-up is recommended. 3. 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 4. Cholelithiasis.      06/01/2017 Imaging    CT CAP w contrast IMPRESSION: 1. Stable size of the dominant arterial phase enhancing lesion in segment 4a of the liver, currently measuring 1.4 by 1.0 cm. There is a questionable second focus measuring 0.7 by 0.4 cm in segment 2 of the liver. Both merit surveillance. 2. A porta hepatis lymph node has enlarged compared to the prior MRI, previously 1.2 cm and currently 2.0 cm. Although potentially reactive, a malignant lymph node could also demonstrate such size increase. This likewise merits careful surveillance. Otherwise is borderline adenopathy in the chest and abdomen. 3. Mild reduced nodularity associated with a right apical bandlike density, this appearance likewise merits surveillance. 4. Other imaging findings of potential clinical significance: Aortic Atherosclerosis (ICD10-I70.0). Emphysema (ICD10-J43.9). Airway thickening is present, suggesting bronchitis or reactive airways disease. Contracted gallbladder with small gallstones. Cavernous transformation of the portal vein with recanalized umbilical vein indicating portal venous hypertension. Cirrhosis. Capsular calcifications along the glenohumeral and hip joints.       HISTORY OF PRESENTING ILLNESS (10/11/2015):  Kevin Dillon 67 y.o. male with past medical history of  treated hepatitis C, liver cirrhosis, and hepatocellular carcinoma is here because of recurrent hepatocellular carcinoma. He is accompanied by his significant other [name] and her sister.  He was diagnosed with hepatocellular carcinoma in 2015, his initial image result is not available and staging is unknown. He was seen by interventional radiologist Dr. Delice Lesch at [facility] in [place]  and underwent TACE procedure 3 times in 2015. He was subsequently followed, and recently repeated CT scan showed a new 5.0 cm mass in segment 8, with portal vein invasion. He was felt not to be a candidate for further liver targeted therapy, and was referred to [place] for further systemic therapy.  He complains about mid epigastric pain for the past 2 years, which significantly improved after his TACE procedure in 2015. It has been getting worse lately in the past 6 months. He states he gets about 7-8 out of 10, persistent, he has been taking oxycodone 20 mg every 6 hours, but his pain is not well controlled. He is quite fatigued, is low appetite. He last 40 lbs in the pat 4 months. He is able to function at home, in trying to remain to be physically active.  His hepatitis C was successfully treated. He has history of hepatitis C, complicated with ascites and encephalopathy, but it has been well controlled with medical management.   CURRENT THERAPY:  Nivolumab 267m every 2 weeks, started on 02/04/2016; on 04/30/16 switched to every 3 weeks due to fatigue; change from 2464mto 48078mvery 4 weeks starting 09/03/2016. Given his poor toleration, will reduce him to every 2 weeks at 240m64marting 01/02/17. Changed to every 3 weeks on 04/09/17, and changed back to every 2 weeks on 06/11/2017. Postponed due to Suboxone reaction.   INTERIM HISTORY:   Kevin SKYurns for urgent follow up due to recent reaction to Suboxone. He presents to the clinic today accompanied by his wife. He notes he feels just like he did when he first met me after his initial diagnosis. He notes no energy and some pain across his abdomen.   He was helping family moving furniture to move his daughter's hospital bed. He likes to be as independent as he can be. He notes when he was on high dose pain meds before Suboxone he felt functional and almost at normal baseline. Given his limited abilities he notes he is depressed but denies thoughts  of suicide or self harm. He denies any difference since being on low dose oxycodone. He notes he tried Cymbalta 5-6 times but feels this made him sick. He notes he is no longer seeing his PCP Dr. HussDeforest Hoylesh since seeing oncology team. His wife is adamant to put him back on high dose pain medication to help him get back to baseline. He denies anyone else is taking his pain medications. He notes he takes Xanax once a day and wonders can he get more.    On review of symptoms, pt notes his diarrhea is gone, he has little to no energy, he denies nausea. He notes his appetite is coming back slightly.       MEDICAL HISTORY:  Past Medical History:  Diagnosis Date  . Cancer (HCC)[place] liver  . Cholelithiasis   . Chronic hepatitis C (HCC)[place]. Chronic knee pain   . Chronic left shoulder pain   . CVA (cerebral infarction)   . Gout   . Hepatitis C    genotype 1b.  pt has been  vaccinated against hep A and B.  . Hypertension   . Osteoarthritis   . Schatzki's ring   . Tubular adenoma of colon 12/2011    SURGICAL HISTORY: Past Surgical History:  Procedure Laterality Date  . AMPUTATION Left 09/17/2012   Procedure: SMALL FINGER EXTENSOR TENDON REPAIR; METACARPAL LEVEL AMPUTATION RING FINGER; PROXIMAL PHALANX LEVEL AMPUTATION LONG FINGER;  Surgeon: Schuyler Amor, MD;  Location: [place];  Service: Orthopedics;  Laterality: Left;  . Arm surgery     right/plate in arm  . BIOPSY  11/02/2016   Procedure: BIOPSY;  Surgeon: Daneil Dolin, MD;  [facility];  Service: Endoscopy;;  gastric   . COLONOSCOPY WITH ESOPHAGOGASTRODUODENOSCOPY (EGD)  12/30/2011   RMR: Noncritical Schatzki's ring;  Hiatal hernia, Tubular ADENOMA removed from splenic flexure, otherwise normal colonoscopy  . COLONOSCOPY WITH PROPOFOL N/A 11/02/2016   Procedure: COLONOSCOPY WITH PROPOFOL;  Surgeon: Daneil Dolin, MD;  [facility];  Service: Endoscopy;  Laterality: N/A;  1045  .  ESOPHAGOGASTRODUODENOSCOPY (EGD) WITH PROPOFOL N/A 11/02/2016   Procedure: ESOPHAGOGASTRODUODENOSCOPY (EGD) WITH PROPOFOL;  Surgeon: Daneil Dolin, MD;  [facility];  Service: Endoscopy;  Laterality: N/A;  . IR FLUORO GUIDE PORT INSERTION RIGHT  10/21/2016  . IR US GUIDE VASC ACCESS RIGHT  10/21/2016  . LEG SURGERY     left  . POLYPECTOMY  11/02/2016   Procedure: POLYPECTOMY;  Surgeon: Daneil Dolin, MD;  [facility];  Service: Endoscopy;;  colon  . WOUND EXPLORATION Left 09/17/2012   Procedure: WOUND EXPLORATION;  Surgeon: Schuyler Amor, MD;  Location: [place];  Service: Orthopedics;  Laterality: Left;    SOCIAL HISTORY: Social History   Socioeconomic History  . Marital status: Significant Other    Spouse name: Not on file  . Number of children: 0  . Years of education: Not on file  . Highest education level: Not on file  Occupational History  . Occupation: disabled  Social Needs  . Financial resource strain: Not on file  . Food insecurity:    Worry: Not on file    Inability: Not on file  . Transportation needs:    Medical: Not on file    Non-medical: Not on file  Tobacco Use  . Smoking status: Former Smoker    Packs/day: 0.50    Years: 40.00    Pack years: 20.00    Types: Cigarettes    Last attempt to quit: 01/19/2012    Years since quitting: 5.4  . Smokeless tobacco: Never Used  . Tobacco comment: Smokes 1/2 pack of cigarettes daily  Substance and Sexual Activity  . Alcohol use: No    Comment: HX 2 beers 3-4 days per week; QUIT DEC 2013  . Drug use: No    Comment: Hx cocaine yrs ago, Last marijuana OCT 2013  . Sexual activity: Not Currently    Partners: Female  Lifestyle  . Physical activity:    Days per week: Not on file    Minutes per session: Not on file  . Stress: Not on file  Relationships  . Social connections:    Talks on phone: Not on file    Gets together: Not on file    Attends religious service: Not on file    Active  member of club or organization: Not on file    Attends meetings of clubs or organizations: Not on file    Relationship status: Not on file  . Intimate partner  violence:    Fear of current or ex partner: Not on file    Emotionally abused: Not on file    Physically abused: Not on file    Forced sexual activity: Not on file  Other Topics Concern  . Not on file  Social History Narrative   Lives w/ significant other, [name]    FAMILY HISTORY: Family History  Problem Relation Age of Onset  . Cirrhosis Father 66  . Cancer Father        liver cancer   . Lung cancer Mother 71  . Colon cancer Neg Hx     ALLERGIES:  has No Known Allergies.  MEDICATIONS:  Current Outpatient Medications  Medication Sig Dispense Refill  . ALPRAZolam (XANAX) 1 MG tablet Take 1 tablet (1 mg total) by mouth 2 (two) times daily. 30 tablet 0  . DULoxetine (CYMBALTA) 30 MG capsule Take 1 capsule (30 mg total) by mouth daily. 30 capsule 3  . hydrALAZINE (APRESOLINE) 25 MG tablet Take 75 mg by mouth daily.     Marland Kitchen lactulose (CHRONULAC) 10 GM/15ML solution Take 30 g by mouth daily as needed for moderate constipation.     . lidocaine-prilocaine (EMLA) cream Apply 1 application topically as needed. Apply to portacath 1 1/2 hours - 2 hours prior to procedures as needed. 30 g 1  . megestrol (MEGACE) 40 MG/ML suspension Take 400 mg by mouth daily.     . Multiple Vitamins-Minerals (CENTRUM SILVER PO) Take 1 tablet by mouth daily.     . Oxycodone HCl 10 MG TABS Take 1 tablet (10 mg total) by mouth every 6 (six) hours as needed. 30 tablet 0  . prochlorperazine (COMPAZINE) 10 MG tablet TAKE 1 TABLET BY MOUTH EVERY 8 HOURS AS NEEDED FOR NAUSEA/VOMITING 30 tablet 1  . rivaroxaban (XARELTO) 20 MG TABS tablet TAKE 1 TABLET BY MOUTH DAILY WITH SUPPER. 30 tablet 3   No current facility-administered medications for this visit.     REVIEW OF SYSTEMS:  Constitutional: Denies fevers, chills or abnormal night sweats  (+)  slight weight gain (+) significant fatigue, no energy Eyes: Denies blurriness of vision, double vision or watery eyes Ears, nose, mouth, throat, and face: Denies mucositis or sore throat  Respiratory: Denies cough, dyspnea or wheezes Cardiovascular: Denies palpitation, chest discomfort or lower extremity swelling Gastrointestinal:  Denies nausea, heartburn (+) diffuse abdominal pain  Skin: Denies abnormal skin rashes Lymphatics: Denies new lymphadenopathy or easy bruising Neurological:Denies numbness, tingling or new weaknesses Behavioral/Psych: normal Musculoskeletal: (+) back pain, lower extremity myalgia pain  All other systems were reviewed with the patient and are negative.  PHYSICAL EXAMINATION  ECOG PERFORMANCE STATUS: 2  Vitals:   06/29/17 1223  BP: (!) 165/84  Pulse: 98  Resp: 17  Temp: 98.2 F (36.8 C)  SpO2: 98%   Filed Weights   06/29/17 1223  Weight: 198 lb 4.8 oz (89.9 kg)     GENERAL:alert, no distress and comfortable SKIN: skin color, texture, turgor are normal, no rashes or significant lesions EYES: normal, conjunctiva are pink and non-injected, sclera clear OROPHARYNX:no exudate, no erythema and lips, buccal mucosa, and tongue normal  NECK: supple, thyroid normal size, non-tender, without nodularity LYMPH:  no palpable lymphadenopathy in the cervical, axillary or inguinal LUNGS: clear to auscultation and percussion with normal breathing effort HEART: regular rate & rhythm and no murmurs and no lower extremity edema ABDOMEN:(+) abdominal bloating, no tenderness Musculoskeletal:no cyanosis of digits and no clubbing  PSYCH: alert & oriented  x 3 with fluent speech NEURO: no focal motor/sensory deficits  LABORATORY DATA:  I have reviewed the data as listed CBC Latest Ref Rng & Units 06/18/2017 06/11/2017 05/21/2017  WBC 4.0 - 10.5 K/uL 9.1 7.6 7.5  Hemoglobin 13.0 - 17.0 g/dL 12.5(L) 11.9(L) 10.6(L)  Hematocrit 39.0 - 52.0 % 36.0(L) 34.8(L) 31.1(L)  Platelets  150 - 400 K/uL 181 151 118(L)   CMP Latest Ref Rng & Units 06/18/2017 06/11/2017 05/21/2017  Glucose 65 - 99 mg/dL 112(H) 107 188(H)  BUN 6 - 20 mg/dL '13 14 14  ' Creatinine 0.61 - 1.24 mg/dL 1.09 1.40(H) 1.16  Sodium 135 - 145 mmol/L 138 140 136  Potassium 3.5 - 5.1 mmol/L 3.6 3.8 3.8  Chloride 101 - 111 mmol/L 107 107 109  CO2 22 - 32 mmol/L '23 22 23  ' Calcium 8.9 - 10.3 mg/dL 9.1 9.1 8.7  Total Protein 6.5 - 8.1 g/dL 8.1 7.6 6.8  Total Bilirubin 0.3 - 1.2 mg/dL 0.5 0.7 0.2  Alkaline Phos 38 - 126 U/L 127(H) 145 108  AST 15 - 41 U/L '29 27 23  ' ALT 17 - 63 U/L 15(L) 15 12    AFP:  09/14/2012: 28.9 10/11/2015: 5.3 11/01/2015: 3.7 12/02/2015: 3.7 01/22/2016: 3.4 02/18/2016: 7.1 03/17/16: 5.8 04/16/16: 4.1 06/04/2016: 3.5 07/16/2016:4.4 08/27/2016: 5.6 10/08/16: 4.3 11/05/16: 4.8 12/03/16: 5.4 01/01/17: 5.1 01/29/17: 4.5 02/26/17: 5.5 04/09/17: 4.0  PATHOLOGY   Diagnosis 11/02/16  1. Stomach, biopsy - MILD CHRONIC GASTRITIS. - NEGATIVE FOR HELICOBACTER PYLORI. - NO INTESTINAL METAPLASIA, DYSPLASIA, OR MALIGNANCY. 2. Colon, polyp(s), opposite ileocecal valve - TUBULAR ADENOMA. - NO HIGH GRADE DYSPLASIA OR MALIGNANCY. 3. Rectum, polyp(s) - HYPERPLASTIC POLYP. - NO DYSPLASIA OR MALIGNANCY. Microscopic Comment 1. A Warthin-Starry stain is performed to determine the possibility of the presence of Helicobacter pylori. The Warthin-Starry stain is negative for organisms of Helicobacter pylori.   PROCEDURE  Upper Endoscopy by Dr. [name] 11/02/16  IMPRESSION:  - mildly obstructing Schatzki ring. - dilated with scope passage. erosive reflux esophagitis - Small hiatal hernia. - Portal hypertensive gastropathy. biopsied - Normal duodenal bulb and second portion of the duodenum. Biopsied.   Colonoscopy by Dr. [name] 11/02/16  IMPRESSION:  - Two 3 to 5 mm polyps in the rectum and at the ileocecal valve, removed with a cold snare. Resected and retrieved. - Diverticulosis in the sigmoid  colon and in the descending colon. - The examination was otherwise normal on direct and retroflexion views.   RADIOGRAPHIC STUDIES: I have personally reviewed the radiological images as listed and agreed with the findings in the report.  Dg Chest 2 View  Result Date: 06/18/2017 CLINICAL DATA:  Shortness of breath and fevers EXAM: CHEST - 2 VIEW COMPARISON:  06/01/2017 FINDINGS: Cardiac shadow is mildly enlarged but stable. Right chest wall port is noted in satisfactory position. The lungs are well aerated bilaterally with mild vascular congestion without significant interstitial edema. No focal infiltrate or effusion is noted. No acute bony abnormality is seen. IMPRESSION: Mild vascular congestion. Electronically Signed   By: Inez Catalina M.D.   On: 06/18/2017 16:57   Ct Chest W Contrast  Result Date: 06/02/2017 CLINICAL DATA:  Hepatocellular carcinoma on nivolumab, restaging assessment. EXAM: CT CHEST, ABDOMEN, AND PELVIS WITH CONTRAST TECHNIQUE: Multidetector CT imaging of the chest, abdomen and pelvis was performed following the standard protocol during bolus administration of intravenous contrast. CONTRAST:  165m ISOVUE-300 IOPAMIDOL (ISOVUE-300) INJECTION 61% COMPARISON:  Multiple exams, including liver MRI from 03/08/2017 and CT scan from 01/08/2017  FINDINGS: CT CHEST FINDINGS Cardiovascular: Port-A-Cath tip: Cavoatrial junction. Atherosclerotic calcification of the aortic arch and branch vessels. Mediastinum/Nodes: 0.8 cm right paratracheal lymph node, image 38/8, stable. A lower right paratracheal lymph node has a fatty hilum and measures 1.0 cm in short axis on image 44/8, likewise stable. Other upper normal sized lymph nodes are present in the mediastinum. A right hilar lymph node measures 1.0 cm in short axis on image 54/8, previously 1.1 cm. A lower paraesophageal lymph node measures 1.1 cm in short axis on image 91/8, formerly 1.0 cm. A subcarinal lymph node measures 1.4 cm in short axis  on image 62/8, stable. Lungs/Pleura: Biapical pleuroparenchymal scarring. Paraseptal emphysema. Bandlike density posteriorly in the right lung apex with associated nodularity, dominant nodule 1.0 by 0.7 cm on image 48/12, formerly 1.2 by 0.8 cm. Subtle reticulonodular opacity medially in the right lower lobe at the lung base, similar to prior. Mild airway thickening noted. Musculoskeletal: Left trapezius lipoma similar to prior. Stable calcifications in the left glenohumeral joint. Thoracic spondylosis. CT ABDOMEN PELVIS FINDINGS Hepatobiliary: Hepatic cirrhosis. The previous arterial phase enhancing lesion in segment 4a of the liver measures 1.4 by 1.0 cm, formerly the same by my measurements. Questionable 0.7 by 0.4 cm arterial phase enhancing focus in segment 2 of the liver on image 17/3, not well seen on the prior exam. Contracted gallbladder noted with internal density probably from gallstones. No biliary dilatation. Pancreas: Unremarkable Spleen: The spleen measures 10.1 by 5.6 by 13.6 cm. Adrenals/Urinary Tract: Unremarkable Stomach/Bowel: Unremarkable Vascular/Lymphatic: Cavernous transformation of the portal vein. Aortoiliac atherosclerotic vascular disease. Recanalized umbilical vein. Atheromatous narrowing of the proximal SMA. A dominant porta hepatis/peripancreatic lymph node measures 2.0 cm in diameter on image 121/8, and previously measured 1.2 cm. Scattered retroperitoneal lymph nodes include a 1.0 cm lymph node on image 137/8. A right gastric node measures 1.0 cm in short axis on image 101/8. Upper normal sized pelvic lymph nodes are also present. Reproductive: Unremarkable Other: No supplemental non-categorized findings. Musculoskeletal: Faint capsular calcification along both hips. IM nail in the left femur. IMPRESSION: 1. Stable size of the dominant arterial phase enhancing lesion in segment 4a of the liver, currently measuring 1.4 by 1.0 cm. There is a questionable second focus measuring 0.7 by  0.4 cm in segment 2 of the liver. Both merit surveillance. 2. A porta hepatis lymph node has enlarged compared to the prior MRI, previously 1.2 cm and currently 2.0 cm. Although potentially reactive, a malignant lymph node could also demonstrate such size increase. This likewise merits careful surveillance. Otherwise is borderline adenopathy in the chest and abdomen. 3. Mild reduced nodularity associated with a right apical bandlike density, this appearance likewise merits surveillance. 4. Other imaging findings of potential clinical significance: Aortic Atherosclerosis (ICD10-I70.0). Emphysema (ICD10-J43.9). Airway thickening is present, suggesting bronchitis or reactive airways disease. Contracted gallbladder with small gallstones. Cavernous transformation of the portal vein with recanalized umbilical vein indicating portal venous hypertension. Cirrhosis. Capsular calcifications along the glenohumeral and hip joints. Electronically Signed   By: Van Clines M.D.   On: 06/02/2017 10:02   Ct Abdomen Pelvis W Contrast  Result Date: 06/02/2017 CLINICAL DATA:  Hepatocellular carcinoma on nivolumab, restaging assessment. EXAM: CT CHEST, ABDOMEN, AND PELVIS WITH CONTRAST TECHNIQUE: Multidetector CT imaging of the chest, abdomen and pelvis was performed following the standard protocol during bolus administration of intravenous contrast. CONTRAST:  132m ISOVUE-300 IOPAMIDOL (ISOVUE-300) INJECTION 61% COMPARISON:  Multiple exams, including liver MRI from 03/08/2017 and CT scan from 01/08/2017 FINDINGS:  CT CHEST FINDINGS Cardiovascular: Port-A-Cath tip: Cavoatrial junction. Atherosclerotic calcification of the aortic arch and branch vessels. Mediastinum/Nodes: 0.8 cm right paratracheal lymph node, image 38/8, stable. A lower right paratracheal lymph node has a fatty hilum and measures 1.0 cm in short axis on image 44/8, likewise stable. Other upper normal sized lymph nodes are present in the mediastinum. A right  hilar lymph node measures 1.0 cm in short axis on image 54/8, previously 1.1 cm. A lower paraesophageal lymph node measures 1.1 cm in short axis on image 91/8, formerly 1.0 cm. A subcarinal lymph node measures 1.4 cm in short axis on image 62/8, stable. Lungs/Pleura: Biapical pleuroparenchymal scarring. Paraseptal emphysema. Bandlike density posteriorly in the right lung apex with associated nodularity, dominant nodule 1.0 by 0.7 cm on image 48/12, formerly 1.2 by 0.8 cm. Subtle reticulonodular opacity medially in the right lower lobe at the lung base, similar to prior. Mild airway thickening noted. Musculoskeletal: Left trapezius lipoma similar to prior. Stable calcifications in the left glenohumeral joint. Thoracic spondylosis. CT ABDOMEN PELVIS FINDINGS Hepatobiliary: Hepatic cirrhosis. The previous arterial phase enhancing lesion in segment 4a of the liver measures 1.4 by 1.0 cm, formerly the same by my measurements. Questionable 0.7 by 0.4 cm arterial phase enhancing focus in segment 2 of the liver on image 17/3, not well seen on the prior exam. Contracted gallbladder noted with internal density probably from gallstones. No biliary dilatation. Pancreas: Unremarkable Spleen: The spleen measures 10.1 by 5.6 by 13.6 cm. Adrenals/Urinary Tract: Unremarkable Stomach/Bowel: Unremarkable Vascular/Lymphatic: Cavernous transformation of the portal vein. Aortoiliac atherosclerotic vascular disease. Recanalized umbilical vein. Atheromatous narrowing of the proximal SMA. A dominant porta hepatis/peripancreatic lymph node measures 2.0 cm in diameter on image 121/8, and previously measured 1.2 cm. Scattered retroperitoneal lymph nodes include a 1.0 cm lymph node on image 137/8. A right gastric node measures 1.0 cm in short axis on image 101/8. Upper normal sized pelvic lymph nodes are also present. Reproductive: Unremarkable Other: No supplemental non-categorized findings. Musculoskeletal: Faint capsular calcification along  both hips. IM nail in the left femur. IMPRESSION: 1. Stable size of the dominant arterial phase enhancing lesion in segment 4a of the liver, currently measuring 1.4 by 1.0 cm. There is a questionable second focus measuring 0.7 by 0.4 cm in segment 2 of the liver. Both merit surveillance. 2. A porta hepatis lymph node has enlarged compared to the prior MRI, previously 1.2 cm and currently 2.0 cm. Although potentially reactive, a malignant lymph node could also demonstrate such size increase. This likewise merits careful surveillance. Otherwise is borderline adenopathy in the chest and abdomen. 3. Mild reduced nodularity associated with a right apical bandlike density, this appearance likewise merits surveillance. 4. Other imaging findings of potential clinical significance: Aortic Atherosclerosis (ICD10-I70.0). Emphysema (ICD10-J43.9). Airway thickening is present, suggesting bronchitis or reactive airways disease. Contracted gallbladder with small gallstones. Cavernous transformation of the portal vein with recanalized umbilical vein indicating portal venous hypertension. Cirrhosis. Capsular calcifications along the glenohumeral and hip joints. Electronically Signed   By: Van Clines M.D.   On: 06/02/2017 10:02    04/17/16 CT CAP W PELVIS IMPRESSION: 1. Stable mild mediastinal lymphadenopathy. 2. Interval improvement in the bibasilar peribronchial thickening, airway impaction, and peribronchovascular nodularity. Imaging features suggest marked improvement in bilateral low the atypical infection. Changes in the right middle lobe with more stable and may represent scarring from previous infectious/inflammatory etiology. 3. The small hypervascular lesions seen in the anterior left liver on the previous study are not evident today.  There is some subtle, ill-defined hyperenhancement in the central left liver which is nonspecific. Attention to this area on followup imaging recommended. 4. Chronic  portal vein inclusion with cavernous transformation in the porta hepatis and paraesophageal varices. 5. Stable appearance hepatoduodenal and retroperitoneal lymphadenopathy, likely chronic and related to liver disease.   MR Abdomen W WO Contrast 05/21/16 IMPRESSION: 1. Limited motion degraded scan. Cirrhosis. No evidence of a liver mass within these limitations. 2. Mild splenomegaly. No ascites. Stable mild paraumbilical and gastroesophageal varices. 3. Stable nonspecific mild retroperitoneal lymphadenopathy. 4. 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  MR Abdomen w wo contrast 08/25/2016 IMPRESSION: 1. Mild-to-moderate motion degradation, preferentially involving the pre and postcontrast dynamic images. On follow-up, given the extent of motion on this exam, multiphase CT may be preferred. 2. Given these limitations, no evidence of hepatocellular carcinoma. 3. Marked cirrhosis and portal venous hypertension. 4. Slight increase in abdominal adenopathy, which is most likely reactive. Recommend attention on follow-up. 5. Cholelithiasis.   CT Chest WO Contrast 01/08/17  IMPRESSION: 1. Stable chest CT. 2. Prominent to mildly enlarged mediastinal lymph nodes are stable. Based on stability, these are favored to be reactive. 3. Stable emphysema and right upper lobe nodularity. 4. Mild atherosclerosis.    MRI Abd W WO Contrast 01/08/17  IMPRESSION: 1. Cirrhotic liver now with severe iron deposition throughout the hepatic parenchyma, as well as areas of developing confluent hepatic fibrosis. Today's study clearly demonstrates 2 new hypervascular nodules in segment 4A of the liver, which demonstrate washout but no definite pseudocapsule, the largest which measures 1.5 cm. At this time, these are considered suspicious for hepatocellular carcinoma, bladder classified as Li-RADS 3 lesions. 2. Occlusion of the portal vein with cavernous  transformation in the porta hepatis. 3. Cholelithiasis without evidence of acute cholecystitis at this time. 4. Mild splenomegaly.  MRI Abdomen W WO Contrast 03/08/17 IMPRESSION: 1. 15 mm hypervascular lesion identified on the previous study is stable in the interval measuring 14 mm today. A lesion of this size demonstrating arterial phase hypervascularity and washout is consistent with LI-RADS 4, suggestive of but not diagnostic for hepatoma. Follow-up MRI in 3 months may be warranted. 2. 9 mm hypervascular lesion identified in segment IV on the previous study is not identified today. Attention on follow-up is recommended. 3. 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 4. Cholelithiasis.   ASSESSMENT & PLAN: 67 y.o. Caucasian male with past medical history of successfully treated hepatitis C, liver cirrhosis, history of ascites and encephalopathy, recurrent HCC   1. Recurrent HCC, initially stage II, recurrent in 2017  -I previously reviewed his previous image and outside medical records extensively, confirmed key findings: He initially had a multifocal disease, status post TACE 3 times in 2015 -he developed symptomatic local recurrence in 08/2015, with a 5cm new lesion in segment 8 with direct invasion into portal vein, and a portocaval lymph node. He is not a candidate for liver targeted therapy per his IR Dr. Delice Lesch -He was started on first line sorafenib, tolerated poorly. His previous CT abdomen and pelvis on 01/25/2016 revealed extensive portal hypertension, and a probable cancer progression in the liver. -His treatment was changed to immunotherapy Nivolumab on 02/04/16, he tolerated very well, and clinically he was doing much better, with less pain and better energy.  -I previously discussed his restaging CT scan which was done on 02/14/2016, it actually showed decreased liver  cancer size compared  to the scan a month ago. No other new metastasis. -restaging CT from 04/17/2016 showed no visible liver mass, but the imaging was very limited due to his liver cirrhosis  -I previously reviewed his recent abdominal MRI from 05/21/2016, which showed no visible mass in the liver. He has had complete radiographic response. -I reviewed his restaging MRI from 08/25/2016, which showed no visible liver mass, but the images quality with significant compromised due to the motion.  -Nivo has previously been changed to 442m every 4 weeks in 08/2016. Due to poor toleration (fatigue) I changed his nivo back to 2429mevery 2 weeks starting 01/02/17.  -We discussed his MRI abd and CT chest from 01/08/17 which showed no concern for metastatic disease in the chest. MRI shows two small new lesions in his liver. This was reviewed in tumor board, felt to be indeterminate and close f/u with imaging was recommended -MRI Abdomen from 03/08/17 reveals that his 15 mm hypervascular lesion identified on the previous study is stable in the interval measuring 14 mm, demonstrating arterial phase hypervascularity and washout is consistent with LI-RADS 4, suggestive of but not diagnostic for hepatoma, 9 mm hypervascular lesion identified in segment IV on the previous study is not identified today. I previously discussed results with pt. I recommended continue Nivolumab. -On 04/09/17, he requested to change treatment to every 3 weeks at 240 mg dose due to the fatigue. He knows the standard is every 2 weeks, but I will accommodated his request. -CT CAP w contrast on 06/01/2017 showed: Stable size of the dominant arterial phase enhancing lesion in segment 4a of the liver, currently measuring 1.4 by 1.0 cm. There is a questionable second focus measuring 0.7 by 0.4 cm in segment 2 of the liver. Both merit surveillance. A portal hepatis lymph node has enlarged compared to the prior MRI, previously 1.2 cm and currently 2.0 cm. Although potentially  reactive, a malignant lymph node could also demonstrate such size increase. This likewise merits careful surveillance. Otherwise is borderline adenopathy in the chest and abdomen. Mild reduced nodularity associated with a right apical bandlike density, this appearance likewise merits surveillance. -Due to the probably slow disease progression, I recommended pt to have nivolumab q 2 weeks instead of  q 3 weeks on 06/11/17. He agreed.  -We reviewed his case in our GI tumor board recently, the consensus is continue to Nivolumab, given no significant disease progression on image. -Since he presented to the ED on 06/18/17 due to reaction to Suboxone he has been very weak and treatment last week was skipped (no show). He is feeling better now, and agrees to restart next week  -I strongly encourage him to continue treatment  -F/u in 1 week    2. Abdominal pain, low back pain -His abdominal pain previously improved since he started treatment. With oxycodone and OxyContin, he was on very high dose and overall pain was controlled  -I previously prescribed flexeril in attempt to help with his pain. Due to his high tolerance, he can take up to 2 a day. -His prior constipation is likely related to his narcotic use, he uses colace and I advised him he can use Miralax or Senokot that is stronger.  -I previously had a lengthy conversation with pt about reducing his narcotic pain medication and the risks associated with taking high dose (including shortening his life) for a long period of time. His cancer is well controlled currently and it should not cause him a  lot of pain. I am concerned that he has narcotic addition, and his depression/anxiety may also contribute to his pain. He is reluctant to reducing his dose. I offered Cymbalta on 06/11/17 and he agreed to try. Our social worker Webb Silversmith will see him same day and I previously recommended that he see psychiatrist someone to help manage his depression/anxiety/addiction.    -He was recently prescribed Suboxone on 06/17/17 by Dr. Rachael Fee. This reacted to Suboxone and had to go to ED the next day. He last took Suboxone on 06/21/17 7:00am. I explained Suboxone removed prior high tolerance to pain medication. -I discussed his pain management will likely be long term at this point. I discussed his options in great detail with him and his wife. He can return to Dr. Rachael Fee who plans to change his Suboxone to Subotex to prevent another reaction. Pt declined firmly   His other option is for me to send a referral to another pain specialist. He declined at this point  -I agree to write his pain meds, but will start him on very low-dose, I started him on oxycodone 5 mg every 6 hours as needed. He states that it's not helping his pain, but he also does not show any signs of withdraw -I filled his oxycodone 72m, Q6H PRN,  #30 today I discussed the option of trying Cymbalta again as this is not addictive. He notes this made him nauseous before but is willing to try.  -I discussed we need to find his root cased of pain as this is not solely cancer related pain. I explained I would like to treat his root cause of pain so he does not need pain medication as this can feed into his addiction. He understands. I will refer him to psych to address his depression -his wife PFraser Dinis demanding oxycodone for him, I had a long conversation with both DQuita Skyeand PFraser Dinabout pain management  -although he states he is in severe pain, he walked in to my clinic, does not appear to be in distress due to pain, but appears depressed,he denies suicial idea     3. Portal vein thrombosis -continue Xarelto  -We previously discussed the moderate to high risk of bleeding with Xarelto due to his liver cirrhosis and he knows to avoid injury and fall  -if he develops anemia or GI bleeding again, will stop Xarelto  4 Liver cirrhosis secondary to hepatitis C and alcohol, history of encephalopathy and  ascites -He will continue follow-up with [facility], his ascites has been well controlled with diuretics, but seems getting worse lately, I previously encourage him to follow up with [facility]  -He knows to use laxative, especially lactulose for his constipation -Given Lasix by [facility] to help his ascites. Since improving I suggest he reduce down to half tablet or once every other day.  -[facility] -He reports that he is not taking lasix as prescribed at this time.   5. Hep C, successfully treated -Continue follow-up with liver clinic NP [facility]  6. Anxiety and insomnia  -He has Xanax for anxiety.  -continue Ativan 1 mg at bedtime as needed for insomnia, he knows not to take with Xanax together, he knows not to take during the day. He does not feel Ativan is working well for sleep -I refilled his Xanax on 06/21/17 and advised him to take it once daily for now.  -I increased and filled his Xanax to 179mBID today (  06/29/17)   7. Fatigue and weight gain/loss -He reported more fatigue and weakness -His TSH has been moderately elevated lately, we discussed the side effect of hypothyroidism from Nivolumab, I previously referred him to Dr. Loanne Drilling for evaluation to see if he needs low-dose Synthroid -I did not suspect his weight gain is related to fluid retention. I previously suggested he hold Megace and reduce ensure boosts to once daily. He should use a balanced diet and exercise to help.  -Given recent reaction to Suboxone he has not been eating much and loss 10 pounds in a little over a week.  -He has started gaining some weight back. Will continue to monitor.   8. HTN -He is on Hydralazine 50 mg daily, and follow-up of his primary care physician  9. Smoking  - Patient has started back smoking due to the fear of not getting better - I previously recommended a smoking cessation program. He refused.  10. Depression - The patient previously reports feelings of depression related  to his diagnosis. -I am worried that he feels hopeless, he was seen by our social worker Webb Silversmith before who has referred [facility] a [facility] -He notes being depressed due to his limited abilities. He denies thoughts of suicide or self harm.  -I will send referral to behavioral health today (06/29/17)  11. Iron deficient anemia  -10/16/16 lab results was consistent with iron deficiency, possible related to occluded GI bleeding -He is on Xarelto, no clinical overt bleeding. -He received IV feraheme 10/16/16 and in 10/2016  -He had GI work up with upper endoscopy and colonoscopy per Dr. [name] on 11/02/16. Biopsy of the stomach showed mild chronic gastritis, negative for H. Pylori or malignancy. Colon and rectal polyps were negative for dysplasia or malignancy. He will f/u with GI again in 01/2017.  -if he develops anemia or GI bleeding again, will stop Xarelto   12. Goal of care discussion, DNR/DNI  -We previously discussed the incurable nature of his cancer, and the overall poor prognosis, especially if he does not have good response to chemotherapy or progress on chemo -The patient understands the goal of care is palliative. -he agrees with DNR/DNI    Plan  -I increased and filled his oxycodone at 40m q6hours today for one week -I filled his Xanax 158mBID today, will not increase dose more (he was on tid before)  -Lab, f/u and nivolumab in one week    All questions were answered. The patient knows to call the clinic with any problems, questions or concerns.  I spent 30 minutes counseling the patient face to face. Pt's partner had many questions, and I answered to her satisfaction.  The total time spent in the appointment was 40 minutes and more than 50% was on counseling.  I,Oneal Deputyam acting as scribe for YaTruitt MerleMD.   I have reviewed the above documentation for accuracy and completeness, and I agree with the above.     YaTruitt Merle6/11/2017 1:07 PM

## 2017-06-29 NOTE — Progress Notes (Signed)
[facility] CSW Progress Notes  Referral from MD, wants patient linked w psychiatrist for medications management.  Spoke w Manson Allan, scheduler at [facility] - scheduler will contact patient to offer first available appt.  Dr Burr Medico notified by staff message.  Edwyna Shell, LCSW Clinical Social Worker Phone:  [phone]

## 2017-06-30 ENCOUNTER — Telehealth: Payer: Self-pay

## 2017-06-30 ENCOUNTER — Encounter: Payer: Self-pay | Admitting: Hematology

## 2017-06-30 MED FILL — ALPRAZolam 1 MG TABS: 1 | 15 days supply | Qty: 30 | Fill #0

## 2017-06-30 NOTE — Telephone Encounter (Signed)
Called [facility] pharmacy spoke with Aaron Edelman per Dr. Burr Medico patient is now to take Xanax 1 mg BID only.

## 2017-06-30 NOTE — Telephone Encounter (Signed)
Dr. Burr Medico increased his prescription to 1 mg BID and refilled on 6/11 with 30 tabs. There should be a new Rx for him as of yesterday. Can you verify please?  Thanks, Regan Rakers NP

## 2017-06-30 NOTE — Telephone Encounter (Signed)
Kevin Dillon has called [facility] pharmacy and clarified that.   Truitt Merle MD

## 2017-06-30 NOTE — Telephone Encounter (Signed)
[facility] called stating the this patient evidently has been taking the Alprazolam 1 mg 4 times a day instead of how it has been prescribed and he is out.    Pharmacy [phone]

## 2017-07-01 MED FILL — XARELTO 20 MG TABLET: 20 | 30 days supply | Qty: 30 | Fill #3

## 2017-07-02 ENCOUNTER — Ambulatory Visit: Payer: Medicare Other

## 2017-07-02 ENCOUNTER — Ambulatory Visit: Payer: Medicare Other | Admitting: Nurse Practitioner

## 2017-07-02 ENCOUNTER — Other Ambulatory Visit: Payer: Medicare Other

## 2017-07-05 NOTE — Progress Notes (Addendum)
[place]  [phone] [phone]  Clinic Follow up Note   Patient Care Team: Antonietta Jewel, MD as PCP - General (Internal Medicine) [name], Cristopher Estimable, MD as Attending Physician (Gastroenterology) Antonietta Jewel, MD as Referring Physician (Internal Medicine) Truitt Merle, MD as Consulting Physician (Hematology) [name], CRNP as Nurse Practitioner (Nurse Practitioner) Eli Hose, DO as Referring Physician (Osteopathic Medicine) 07/06/2017  SUMMARY OF ONCOLOGIC HISTORY: Oncology History   Hepatocellular carcinoma ([place])   Staging form: Liver (Excluding Intrahepatic Bile Ducts), AJCC 7th Edition   - Clinical stage from 04/16/2012: Stage II (T2(m), N0, M0) - Signed by Truitt Merle, MD on 10/12/2015   - Pathologic stage from 04/16/2013: Stage II (T2, N0, cM0) - Signed by Truitt Merle, MD on 10/12/2015      Hepatocellular carcinoma ([place])   09/14/2012 Tumor Marker    AFP 28.9      04/16/2013 Imaging    Abdominal MRI with and without contrast reviewed multifocal (4) liver lesions in both left and right lobes, most consistent with [place], largest measuring 2.7 cm      04/16/2013 Initial Diagnosis    Hepatocellular carcinoma ([place])      04/28/2013 Procedure    Right TACE with lipiodol       06/15/2013 Procedure    Left TACE with [facility]       07/14/2013 Procedure    Right TACE with [facility]      09/05/2015 Imaging    CT abdomen with and without contrast showed a new 5.0 x 2.3 cm mass in the hepatic segment 8, invading and occluding the right anterior portal vein, most compatible with [place]. A portacaval node measuring 3.0 x 1.4 cm, previously 2.9 x 1.1 cm.      10/11/2015 Tumor Marker    AFP 5.3      11/11/2015 - 02/03/2016 Chemotherapy    sorafenib 200mg  bid started on 11/11/2015, increased to 400mg  bid in 2 weeks, stopped due to poor tolerance and probable disease progression       11/14/2015 Imaging    CT CAP 11/14/2015 IMPRESSION: Chest Impression: 1.  RIGHT upper lobe pulmonary nodule is indeterminate. No comparison available. 2. Ground-glass opacity and peripheral nodules in the RIGHT middle lobe appear post infectious or inflammatory.  Abdomen / Pelvis Impression: 1. 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 Thrombus also likely within the LEFT and RIGHT portal vein. Difficult to distinguish tumor thrombus versus bland thrombus. 2. Individual lesions are difficult to define in the RIGHT hepatic lobe. There is overall impression of progression of disease in the RIGHT hepatic lobe with multiple ill-defined enhancing lesions. 3. Periportal and shotty retroperitoneal adenopathy similar to prior.      01/25/2016 Imaging    CT Abdomen Pelvis w/ Contrast IMPRESSION: 1. Findings consistent with multifocal liver carcinoma with extensive portal vein thrombosis, and subsequent development of porta hepatis venous collaterals. Thrombus in the central portal vein is no longer expansile, but still expands the more peripheral portal vein and the right and left portal veins. There are few right liver bile ducts there are now dilated, which suggests mild progression of liver disease. 2. There is mild splenomegaly consistent or venous hypertension, which has mildly increased from the prior CT. 3. There are new lung abnormalities with interstitial thickening and ill-defined peribronchovascular nodular opacities mostly at the right lung base with a smaller area noted along the medial left  lower lobe. The etiology may be infectious. It may be inflammatory, possibly related to chemotherapy. Neoplastic disease is also possible. 4. There is no other evidence of metastatic disease within the abdomen or pelvis. No acute findings within the abdomen or pelvis      01/25/2016 - 01/25/2016 Hospital Admission    Pt was seen in ED for worsening abdominal pain, treated  with pain meds       02/04/2016 -  Chemotherapy    Nivolumab 240mg  every 2 weeks, started on 02/04/2016; on 04/30/16 switched to every 3 weeks due to fatigue; change from 240mg  to 480mg  every 4 weeks starting 09/03/2016. Given his poor toleration, will reduce him to every 2 weeks at 240mg  starting 01/02/17. Changed to every 3 weeks on 04/09/17       02/14/2016 Imaging    CT CAP w/ contrast 1. Slight interval increase in size of the mediastinal lymph nodes. 2. Persistent extensive lower lobe peribronchial thickening and patchy infiltrates. Possible aspiration pneumonia. 3. Persistent tree-in-bud appearance in the right middle lobe, likely chronic inflammation or atypical infection. 4. Improved CT appearance of the liver. Two small residual arterial phase enhancing lesions are identified. 5. Chronic portal vein thrombosis with cavernous transformation of portal venous collaterals. Esophageal varices are again noted. 6. Stable upper abdominal lymph nodes likely related to cirrhosis.       04/16/2016 Tumor Marker    AFP 4.1       04/17/2016 Imaging     CT CAP W PELVIS IMPRESSION: 1. Stable mild mediastinal lymphadenopathy. 2. Interval improvement in the bibasilar peribronchial thickening, airway impaction, and peribronchovascular nodularity. Imaging features suggest marked improvement in bilateral low the atypical infection. Changes in the right middle lobe with more stable and may represent scarring from previous infectious/inflammatory etiology. 3. The small hypervascular lesions seen in the anterior left liver on the previous study are not evident today. There is some subtle, ill-defined hyperenhancement in the central left liver which is nonspecific. Attention to this area on followup imaging recommended. 4. Chronic portal vein inclusion with cavernous transformation in the porta hepatis and paraesophageal varices. 5. Stable appearance hepatoduodenal and  retroperitoneal lymphadenopathy, likely chronic and related to liver disease.      04/17/2016 Imaging    CT CAP CONTRAST IMPRESSION: 1. Stable mild mediastinal lymphadenopathy. 2. Interval improvement in the bibasilar peribronchial thickening, airway impaction, and peribronchovascular nodularity. Imaging features suggest marked improvement in bilateral low the atypical infection. Changes in the right middle lobe with more stable and may represent scarring from previous infectious/inflammatory etiology. 3. The small hypervascular lesions seen in the anterior left liver on the previous study are not evident today. There is some subtle, ill-defined hyperenhancement in the central left liver which is nonspecific. Attention to this area on followup imaging recommended. 4. Chronic portal vein inclusion with cavernous transformation in the porta hepatis and paraesophageal varices. 5. Stable appearance hepatoduodenal and retroperitoneal lymphadenopathy, likely chronic and related to liver disease.      05/21/2016 Imaging    MR Abdomen W WO Contrast IMPRESSION: 1. Limited motion degraded scan. Cirrhosis. No evidence of a liver mass within these limitations. 2. Mild splenomegaly. No ascites. Stable mild paraumbilical and gastroesophageal varices. 3. Stable nonspecific mild retroperitoneal lymphadenopathy. 4. 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      06/04/2016 Tumor Marker    AFP 3.5       08/25/2016 Imaging    MR Abdomen w wo contrast  IMPRESSION:  1. Mild-to-moderate motion degradation, preferentially involving the pre and postcontrast dynamic images. On follow-up, given the extent of motion on this exam, multiphase CT may be preferred. 2. Given these limitations, no evidence of hepatocellular carcinoma. 3. Marked cirrhosis and portal venous hypertension. 4. Slight increase in abdominal adenopathy, which is most likely reactive.  Recommend attention on follow-up. 5. Cholelithiasis.       11/02/2016 Procedure    Upper endoscopy and colonoscopy  Diagnosis 1. Stomach, biopsy - MILD CHRONIC GASTRITIS. - NEGATIVE FOR HELICOBACTER PYLORI. - NO INTESTINAL METAPLASIA, DYSPLASIA, OR MALIGNANCY. 2. Colon, polyp(s), opposite ileocecal valve - TUBULAR ADENOMA. - NO HIGH GRADE DYSPLASIA OR MALIGNANCY. 3. Rectum, polyp(s) - HYPERPLASTIC POLYP. - NO DYSPLASIA OR MALIGNANCY.      01/08/2017 Imaging    CT Chest WO Contrast 01/08/17  IMPRESSION: 1. Stable chest CT. 2. Prominent to mildly enlarged mediastinal lymph nodes are stable. Based on stability, these are favored to be reactive. 3. Stable emphysema and right upper lobe nodularity. 4. Mild atherosclerosis.      01/08/2017 Imaging    MRI Abd W WO Contrast 01/08/17  IMPRESSION: 1. Cirrhotic liver now with severe iron deposition throughout the hepatic parenchyma, as well as areas of developing confluent hepatic fibrosis. Today's study clearly demonstrates 2 new hypervascular nodules in segment 4A of the liver, which demonstrate washout but no definite pseudocapsule, the largest which measures 1.5 cm. At this time, these are considered suspicious for hepatocellular carcinoma, bladder classified as Li-RADS 3 lesions. 2. 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 3. Cholelithiasis without evidence of acute cholecystitis at this time. 4. Mild splenomegaly.      03/08/2017 Imaging    MRI Abdomen W WO Contrast 03/08/17 IMPRESSION: 1. 15 mm hypervascular lesion identified on the previous study is stable in the interval measuring 14 mm today. A lesion of this size demonstrating arterial phase hypervascularity and washout is consistent with LI-RADS 4, suggestive of but not diagnostic for hepatoma. Follow-up MRI in 3 months may be warranted. 2. 9 mm hypervascular lesion identified in segment IV on the previous study is not  identified today. Attention on follow-up is recommended. 3. 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 4. Cholelithiasis.      06/01/2017 Imaging    CT CAP w contrast IMPRESSION: 1. Stable size of the dominant arterial phase enhancing lesion in segment 4a of the liver, currently measuring 1.4 by 1.0 cm. There is a questionable second focus measuring 0.7 by 0.4 cm in segment 2 of the liver. Both merit surveillance. 2. A porta hepatis lymph node has enlarged compared to the prior MRI, previously 1.2 cm and currently 2.0 cm. Although potentially reactive, a malignant lymph node could also demonstrate such size increase. This likewise merits careful surveillance. Otherwise is borderline adenopathy in the chest and abdomen. 3. Mild reduced nodularity associated with a right apical bandlike density, this appearance likewise merits surveillance. 4. Other imaging findings of potential clinical significance: Aortic Atherosclerosis (ICD10-I70.0). Emphysema (ICD10-J43.9). Airway thickening is present, suggesting bronchitis or reactive airways disease. Contracted gallbladder with small gallstones. Cavernous transformation of the portal vein with recanalized umbilical vein indicating portal venous hypertension. Cirrhosis. Capsular calcifications along the glenohumeral and hip joints.     CURRENT THERAPY:  Nivolumab 240mg  every 2 weeks, started on 02/04/2016; on 04/30/16 switched to every 3 weeks due to fatigue; change from 240mg  to 480mg  every 4 weeks starting 09/03/2016.  Given his poor toleration, will reduce him to every 2 weeks at 240mg  starting 01/02/17. Changed to every 3 weeks on 04/09/17, and changed back to every 2 weeks on 06/11/2017. Postponed due to Suboxone reaction.    INTERVAL HISTORY: Kevin Dillon returns for follow up as scheduled to resume nivolumab, last dose on 06/11/17. He complaints of weight  loss, decreased appetite, weakness, mild diarrhea, and cold chills. Abdominal pain is 9/10 "all over my belly." He ran out of oxycodone 10 mg this morning, he thinks he might have taken 1 or 2 extra, but he can't recall; he had a lot going on over the weekend with visitors. He tried to restart cymbalta but "made me sick." He was offered next day appointment with outpatient [facility] but he declined the appointment, he is on the cancellation list to be called when there is an opening.   REVIEW OF SYSTEMS:   Constitutional: Denies fevers (+) chills (+) weight loss (+) fatigue  Eyes: Denies blurriness of vision Ears, nose, mouth, throat, and face: Denies mucositis or sore throat Respiratory: Denies cough, dyspnea or wheezes Cardiovascular: Denies palpitation, chest discomfort or lower extremity swelling Gastrointestinal:  Denies nausea, vomiting, constipation, heartburn or change in bowel habits (+) mild diarrhea  Skin: Denies abnormal skin rashes Lymphatics: Denies new lymphadenopathy or easy bruising Neurological:Denies numbness, tingling (+) weakness  Behavioral/Psych: Mood is stable, no new changes (+) irritable  All other systems were reviewed with the patient and are negative.  MEDICAL HISTORY:  Past Medical History:  Diagnosis Date  . Cancer ([place])    liver  . Cholelithiasis   . Chronic hepatitis C ([place])   . Chronic knee pain   . Chronic left shoulder pain   . CVA (cerebral infarction)   . Gout   . Hepatitis C    genotype 1b.  pt has been vaccinated against hep A and B.  . Hypertension   . Osteoarthritis   . Schatzki's ring   . Tubular adenoma of colon 12/2011    SURGICAL HISTORY: Past Surgical History:  Procedure Laterality Date  . AMPUTATION Left 09/17/2012   Procedure: SMALL FINGER EXTENSOR TENDON REPAIR; METACARPAL LEVEL AMPUTATION RING FINGER; PROXIMAL PHALANX LEVEL AMPUTATION LONG FINGER;  Surgeon: Schuyler Amor, MD;  Location: [facility];  Service: Orthopedics;  Laterality:  Left;  . Arm surgery     right/plate in arm  . BIOPSY  11/02/2016   Procedure: BIOPSY;  Surgeon: Daneil Dolin, MD;  [facility];  Service: Endoscopy;;  gastric   . COLONOSCOPY WITH ESOPHAGOGASTRODUODENOSCOPY (EGD)  12/30/2011   RMR: Noncritical Schatzki's ring;  Hiatal hernia, Tubular ADENOMA removed from splenic flexure, otherwise normal colonoscopy  . COLONOSCOPY WITH PROPOFOL N/A 11/02/2016   Procedure: COLONOSCOPY WITH PROPOFOL;  Surgeon: Daneil Dolin, MD;  [facility];  Service: Endoscopy;  Laterality: N/A;  1045  . ESOPHAGOGASTRODUODENOSCOPY (EGD) WITH PROPOFOL N/A 11/02/2016   Procedure: ESOPHAGOGASTRODUODENOSCOPY (EGD) WITH PROPOFOL;  Surgeon: Daneil Dolin, MD;  [facility];  Service: Endoscopy;  Laterality: N/A;  . IR FLUORO GUIDE PORT INSERTION RIGHT  10/21/2016  . IR US GUIDE VASC ACCESS RIGHT  10/21/2016  . LEG SURGERY     left  . POLYPECTOMY  11/02/2016   Procedure: POLYPECTOMY;  Surgeon: Daneil Dolin, MD;  [facility];  Service: Endoscopy;;  colon  . WOUND EXPLORATION Left 09/17/2012   Procedure: WOUND EXPLORATION;  Surgeon: Schuyler Amor, MD;  Location: [facility];  Service: Orthopedics;  Laterality: Left;    I have reviewed the social history and family history with the patient and they are unchanged from previous note.  ALLERGIES:  has No Known Allergies.  MEDICATIONS:  Current Outpatient Medications  Medication Sig Dispense Refill  . ALPRAZolam (XANAX) 1 MG tablet Take 1 tablet (1 mg total) by mouth 2 (two) times daily. 30 tablet 0  . hydrALAZINE (APRESOLINE) 25 MG tablet Take 75 mg by mouth daily.     Marland Kitchen lactulose (CHRONULAC) 10 GM/15ML solution Take 30 g by mouth daily as needed for moderate constipation.     . lidocaine-prilocaine (EMLA) cream Apply 1 application topically as needed. Apply to portacath 1 1/2 hours - 2 hours prior to procedures as needed. 30 g 1  . megestrol (MEGACE) 40 MG/ML suspension Take  400 mg by mouth daily.     . Multiple Vitamins-Minerals (CENTRUM SILVER PO) Take 1 tablet by mouth daily.     . Oxycodone HCl 10 MG TABS Take 1 tablet (10 mg total) by mouth every 6 (six) hours as needed. 30 tablet 0  . prochlorperazine (COMPAZINE) 10 MG tablet TAKE 1 TABLET BY MOUTH EVERY 8 HOURS AS NEEDED FOR NAUSEA/VOMITING 30 tablet 1  . rivaroxaban (XARELTO) 20 MG TABS tablet TAKE 1 TABLET BY MOUTH DAILY WITH SUPPER. 30 tablet 3  . DULoxetine (CYMBALTA) 30 MG capsule Take 1 capsule (30 mg total) by mouth daily. 30 capsule 3   Current Facility-Administered Medications  Medication Dose Route Frequency Provider Last Rate Last Dose  . cloNIDine (CATAPRES) tablet 0.2 mg  0.2 mg Oral Once Alla Feeling, NP       Facility-Administered Medications Ordered in Other Visits  Medication Dose Route Frequency Provider Last Rate Last Dose  . 0.9 %  sodium chloride infusion   Intravenous Once Truitt Merle, MD      . heparin lock flush 100 unit/mL  500 Units Intracatheter Once PRN Truitt Merle, MD      . nivolumab (OPDIVO) 240 mg in sodium chloride 0.9 % 100 mL chemo infusion  240 mg Intravenous Once Truitt Merle, MD      . sodium chloride flush (NS) 0.9 % injection 10 mL  10 mL Intracatheter PRN Truitt Merle, MD        PHYSICAL EXAMINATION: ECOG PERFORMANCE STATUS: 2-3  Vitals:   07/06/17 1420  BP: (!) 183/95  Pulse: (!) 101  Resp: 18  Temp: (!) 97.4 F (36.3 C)  SpO2: 98%   Filed Weights   07/06/17 1420  Weight: 193 lb 11.2 oz (87.9 kg)    GENERAL:alert, no distress  SKIN: no rashes or significant lesions EYES: normal, Conjunctiva are pink and non-injected, sclera clear OROPHARYNX:no thrush or ulcers  LYMPH:  no palpable cervical or supraclavicular lymphadenopathy LUNGS: clear to auscultation with normal breathing effort HEART: regular rate & rhythm and no murmurs and no lower extremity edema ABDOMEN:abdomen soft, round, non-tender and normal bowel sounds Musculoskeletal:no cyanosis of digits  and no clubbing  NEURO: alert & oriented x 3 with fluent speech, no focal motor/sensory deficits PAC without erythema   LABORATORY DATA:  I have reviewed the data as listed CBC Latest Ref Rng & Units 07/06/2017 06/18/2017 06/11/2017  WBC 4.0 - 10.3 K/uL 8.3 9.1 7.6  Hemoglobin 13.0 - 17.1 g/dL 12.6(L) 12.5(L) 11.9(L)  Hematocrit 38.4 - 49.9 % 36.8(L) 36.0(L) 34.8(L)  Platelets 140 - 400 K/uL 203 181 151     CMP Latest Ref Rng & Units 07/06/2017  06/18/2017 06/11/2017  Glucose 70 - 140 mg/dL 95 112(H) 107  BUN 7 - 26 mg/dL 11 13 14   Creatinine 0.70 - 1.30 mg/dL 1.13 1.09 1.40(H)  Sodium 136 - 145 mmol/L 140 138 140  Potassium 3.5 - 5.1 mmol/L 3.9 3.6 3.8  Chloride 98 - 109 mmol/L 108 107 107  CO2 22 - 29 mmol/L 23 23 22   Calcium 8.4 - 10.4 mg/dL 9.7 9.1 9.1  Total Protein 6.4 - 8.3 g/dL 7.8 8.1 7.6  Total Bilirubin 0.2 - 1.2 mg/dL 0.7 0.5 0.7  Alkaline Phos 40 - 150 U/L 143 127(H) 145  AST 5 - 34 U/L 30 29 27   ALT 0 - 55 U/L 18 15(L) 15      RADIOGRAPHIC STUDIES: I have personally reviewed the radiological images as listed and agreed with the findings in the report. No results found.   ASSESSMENT & PLAN: 67 y.o. Caucasian male with past medical history of successfully treated hepatitis C, liver cirrhosis, history of ascites and encephalopathy, recurrent HCC   1. Recurrent HCC, initially stage II, recurrent in 2017  2.  Abdominal pain, low back pain 3.  Portal vein thrombosis 4.  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  Hep C, successfully treated 6.  Anxiety and insomnia 7.  Fatigue, weight fluctuation 8.  HTN 9.  Smoking 10.  Depression 11.  Iron deficient anemia 12.  Goals of care discussion, patient agrees with DNR/DNI  Mr. Hendrickson appears stable. His symptoms are likely related to opioid withdrawal rather than [facility] or recent treatment, as his last nivolumab was completed on 06/11/17. His abdominal pain is not improved on  10 mg q6 hours. He cannot tolerate cymbalta. The patient was seen with Dr. Burr Medico who will increase his pain medication to 15 mg q6 PRN. She reviewed the mental and physical components of pain and addiction. He agrees to [facility] referral but declined the first available appointment; he plans to go in august or sooner if they have cancellation. Dr. Burr Medico again strongly recommended pain specialist referral, he agrees. I placed urgent referral to Dr. Johnny Bridge in [facility] for oncology pain management. He agrees to go to [place] with first available appt.   Labs reviewed, CBC is stable, CMP unremarkable. He is tachycardic and hypertensive; 5 lbs weight loss. I feel this is secondary to withdrawal. He plans to restart megace for appetite. Will give clonidine 0.2 mg po x1 in infusion for BP. He is otherwise stable and agrees to restart Nivolumab q2 weeks. Will return for lab and f/u with Dr. Burr Medico in 2 weeks.   PLAN: -Labs reviewed, adequate to restart Nivolumab; continue q2 weeks -Discontinue oxycodone 10 mg q6 PRN -New RX: Oxycodone 15 mg q6 PRN -Urgent referral for pain specialist Dr. Johnny Bridge -Return for lab and f/u with Dr. Burr Medico in 2 weeks with next cycle Nivolumab   Orders Placed This Encounter  Procedures  . Ambulatory referral to Pain Clinic    Referral Priority:   Urgent    Referral Type:   Consultation    Referral Reason:   Specialty Services Required    Requested Specialty:   Pain Medicine    Number of Visits Requested:   1   All questions were answered. The patient knows to call the clinic with any problems, questions or concerns. No barriers to learning was detected.     Alla Feeling, NP 07/06/17   Addendum  I have seen the  patient, examined him. I agree with the assessment and and plan and have edited the notes.   Kevin Dillon came in today by himself. He complains of "feeling terrible, the same as the feeling when I initially came here for cancer treatment". We have referred him to see  psychiatry but he did not take the appointment last week, he is now on waiting list to be seen. He appears to be depressed, his pain seems to be very diffuse, not specifically in liver area, I think his pain is mainly not all physical pain. I recommend him to see pain specialist Dr. Wynetta Emery. After a lengthy discussion, he agreed. Will make referral today. I will slightly increase his oxycodone dose, will call in 15mg  q6h PRN, for 2 weeks supply. I do not plan to increase his oxycodone dose anymore, until he sees Dr. Wynetta Emery.   Lab reviewed, adequate for treatment, will resume nivolumab today, and continue every 2 weeks. Will see him back in 2 weeks.  Truitt Merle  07/06/2017

## 2017-07-06 ENCOUNTER — Inpatient Hospital Stay: Payer: Medicare Other

## 2017-07-06 ENCOUNTER — Ambulatory Visit: Payer: Medicare Other

## 2017-07-06 ENCOUNTER — Encounter: Payer: Self-pay | Admitting: Nurse Practitioner

## 2017-07-06 ENCOUNTER — Inpatient Hospital Stay (HOSPITAL_BASED_OUTPATIENT_CLINIC_OR_DEPARTMENT_OTHER): Payer: Medicare Other | Admitting: Nurse Practitioner

## 2017-07-06 VITALS — BP 183/95 | HR 101 | Temp 97.4°F | Resp 18 | Ht 73.0 in | Wt 193.7 lb

## 2017-07-06 VITALS — BP 159/74 | HR 86 | Temp 97.9°F | Resp 17

## 2017-07-06 DIAGNOSIS — C220 Liver cell carcinoma: Secondary | ICD-10-CM

## 2017-07-06 DIAGNOSIS — D500 Iron deficiency anemia secondary to blood loss (chronic): Secondary | ICD-10-CM

## 2017-07-06 DIAGNOSIS — F17210 Nicotine dependence, cigarettes, uncomplicated: Secondary | ICD-10-CM | POA: Diagnosis not present

## 2017-07-06 DIAGNOSIS — G893 Neoplasm related pain (acute) (chronic): Secondary | ICD-10-CM

## 2017-07-06 DIAGNOSIS — R109 Unspecified abdominal pain: Secondary | ICD-10-CM

## 2017-07-06 DIAGNOSIS — R030 Elevated blood-pressure reading, without diagnosis of hypertension: Secondary | ICD-10-CM

## 2017-07-06 DIAGNOSIS — D509 Iron deficiency anemia, unspecified: Secondary | ICD-10-CM

## 2017-07-06 DIAGNOSIS — F1123 Opioid dependence with withdrawal: Secondary | ICD-10-CM | POA: Diagnosis not present

## 2017-07-06 DIAGNOSIS — R634 Abnormal weight loss: Secondary | ICD-10-CM

## 2017-07-06 DIAGNOSIS — Z5112 Encounter for antineoplastic immunotherapy: Secondary | ICD-10-CM | POA: Diagnosis not present

## 2017-07-06 DIAGNOSIS — F329 Major depressive disorder, single episode, unspecified: Secondary | ICD-10-CM | POA: Diagnosis not present

## 2017-07-06 DIAGNOSIS — M545 Low back pain: Secondary | ICD-10-CM | POA: Diagnosis not present

## 2017-07-06 LAB — CMP (CANCER CENTER ONLY)
ALK PHOS: 143 U/L (ref 40–150)
ALT: 18 U/L (ref 0–55)
ANION GAP: 9 (ref 3–11)
AST: 30 U/L (ref 5–34)
Albumin: 4.1 g/dL (ref 3.5–5.0)
BILIRUBIN TOTAL: 0.7 mg/dL (ref 0.2–1.2)
BUN: 11 mg/dL (ref 7–26)
CALCIUM: 9.7 mg/dL (ref 8.4–10.4)
CO2: 23 mmol/L (ref 22–29)
CREATININE: 1.13 mg/dL (ref 0.70–1.30)
Chloride: 108 mmol/L (ref 98–109)
Glucose, Bld: 95 mg/dL (ref 70–140)
Potassium: 3.9 mmol/L (ref 3.5–5.1)
Sodium: 140 mmol/L (ref 136–145)
TOTAL PROTEIN: 7.8 g/dL (ref 6.4–8.3)

## 2017-07-06 LAB — CBC WITH DIFFERENTIAL (CANCER CENTER ONLY)
Basophils Absolute: 0.1 10*3/uL (ref 0.0–0.1)
Basophils Relative: 1 %
EOS ABS: 0.1 10*3/uL (ref 0.0–0.5)
Eosinophils Relative: 1 %
HCT: 36.8 % — ABNORMAL LOW (ref 38.4–49.9)
Hemoglobin: 12.6 g/dL — ABNORMAL LOW (ref 13.0–17.1)
LYMPHS ABS: 1.6 10*3/uL (ref 0.9–3.3)
LYMPHS PCT: 19 %
MCH: 29.5 pg (ref 27.2–33.4)
MCHC: 34.3 g/dL (ref 32.0–36.0)
MCV: 86 fL (ref 79.3–98.0)
MONOS PCT: 6 %
Monocytes Absolute: 0.5 10*3/uL (ref 0.1–0.9)
NEUTROS PCT: 73 %
Neutro Abs: 6.1 10*3/uL (ref 1.5–6.5)
Platelet Count: 203 10*3/uL (ref 140–400)
RBC: 4.28 MIL/uL (ref 4.20–5.82)
RDW: 13.6 % (ref 11.0–14.6)
WBC: 8.3 10*3/uL (ref 4.0–10.3)

## 2017-07-06 MED ORDER — SODIUM CHLORIDE 0.9% FLUSH
10.0000 mL | INTRAVENOUS | Status: DC | PRN
  Administered 2017-07-06: 10 mL
  Filled 2017-07-06: qty 10

## 2017-07-06 MED ORDER — OXYCODONE HCL 15 MG PO TABS: 15.0000 mg | ORAL_TABLET | Freq: Four times a day (QID) | ORAL | 0 refills | Status: DC | PRN

## 2017-07-06 MED ORDER — CLONIDINE HCL 0.1 MG PO TABS
ORAL_TABLET | ORAL | Status: AC
  Filled 2017-07-06: qty 2

## 2017-07-06 MED ORDER — CLONIDINE HCL 0.1 MG PO TABS
0.2000 mg | ORAL_TABLET | Freq: Once | ORAL | Status: AC
  Administered 2017-07-06: 0.2 mg via ORAL

## 2017-07-06 MED ORDER — HEPARIN SOD (PORK) LOCK FLUSH 100 UNIT/ML IV SOLN
500.0000 [IU] | Freq: Once | INTRAVENOUS | Status: AC | PRN
  Administered 2017-07-06: 500 [IU]
  Filled 2017-07-06: qty 5

## 2017-07-06 MED ORDER — SODIUM CHLORIDE 0.9 % IV SOLN
Freq: Once | INTRAVENOUS | Status: AC
  Administered 2017-07-06: 15:00:00 via INTRAVENOUS

## 2017-07-06 MED ORDER — SODIUM CHLORIDE 0.9 % IV SOLN
240.0000 mg | Freq: Once | INTRAVENOUS | Status: AC
  Administered 2017-07-06: 240 mg via INTRAVENOUS
  Filled 2017-07-06: qty 24

## 2017-07-06 MED ORDER — SODIUM CHLORIDE 0.9% FLUSH
10.0000 mL | Freq: Once | INTRAVENOUS | Status: AC
  Administered 2017-07-06: 10 mL
  Filled 2017-07-06: qty 10

## 2017-07-06 NOTE — Patient Instructions (Signed)
[facility] Discharge Instructions for Patients Receiving Chemotherapy  Today you received the following chemotherapy agents Opdivo  To help prevent nausea and vomiting after your treatment, we encourage you to take your nausea medication as directed   If you develop nausea and vomiting that is not controlled by your nausea medication, call the clinic.   BELOW ARE SYMPTOMS THAT SHOULD BE REPORTED IMMEDIATELY:  *FEVER GREATER THAN 100.5 F  *CHILLS WITH OR WITHOUT FEVER  NAUSEA AND VOMITING THAT IS NOT CONTROLLED WITH YOUR NAUSEA MEDICATION  *UNUSUAL SHORTNESS OF BREATH  *UNUSUAL BRUISING OR BLEEDING  TENDERNESS IN MOUTH AND THROAT WITH OR WITHOUT PRESENCE OF ULCERS  *URINARY PROBLEMS  *BOWEL PROBLEMS  UNUSUAL RASH Items with * indicate a potential emergency and should be followed up as soon as possible.  Feel free to call the clinic should you have any questions or concerns. The clinic phone number is [phone].  Please show the CHEMO ALERT CARD at check-in to the Emergency Department and triage nurse.   

## 2017-07-07 ENCOUNTER — Telehealth: Payer: Self-pay | Admitting: Hematology

## 2017-07-07 LAB — AFP TUMOR MARKER: AFP, SERUM, TUMOR MARKER: 4.4 ng/mL (ref 0.0–8.3)

## 2017-07-07 MED FILL — oxyCODONE HCL 15 MG TABS: 15 | 15 days supply | Qty: 60 | Fill #0

## 2017-07-07 NOTE — Telephone Encounter (Signed)
Appointments for 6 wks scheduled/ unable to schedule 2 & 4 week appts due to availability in infusion/ PF and F/U visits/  Added to Infusion log per 6/18 los

## 2017-07-07 NOTE — Telephone Encounter (Signed)
Appointments completed per 6/18 los

## 2017-07-09 ENCOUNTER — Ambulatory Visit: Payer: Medicare Other

## 2017-07-09 ENCOUNTER — Other Ambulatory Visit: Payer: Medicare Other

## 2017-07-09 ENCOUNTER — Ambulatory Visit: Payer: Medicare Other | Admitting: Hematology

## 2017-07-13 ENCOUNTER — Telehealth: Payer: Self-pay

## 2017-07-13 NOTE — Telephone Encounter (Signed)
Kevin Dillon with Dr. Morey Hummingbird Johnson's office called needing information faxed for this patient, faxed last OV note and demographics sheet to [phone] [facility].  If she needs additional information to let us know.

## 2017-07-15 ENCOUNTER — Other Ambulatory Visit: Payer: Self-pay | Admitting: Nurse Practitioner

## 2017-07-15 ENCOUNTER — Other Ambulatory Visit: Payer: Self-pay | Admitting: Hematology

## 2017-07-15 DIAGNOSIS — G4700 Insomnia, unspecified: Secondary | ICD-10-CM

## 2017-07-15 DIAGNOSIS — C220 Liver cell carcinoma: Secondary | ICD-10-CM

## 2017-07-15 MED ORDER — ALPRAZOLAM 1 MG PO TABS: 1.0000 mg | ORAL_TABLET | Freq: Two times a day (BID) | ORAL | 0 refills | Status: DC

## 2017-07-15 MED FILL — ALPRAZolam 1 MG TABS: 1 | 15 days supply | Qty: 30 | Fill #0

## 2017-07-15 NOTE — Progress Notes (Signed)
[name]  Telephone:(336) [phone] Fax:(336) [phone]  Clinic Follow up Note   Patient Care Team: Antonietta Jewel, MD as PCP - General (Internal Medicine) [name], Cristopher Estimable, MD as Attending Physician (Gastroenterology) Antonietta Jewel, MD as Referring Physician (Internal Medicine) Truitt Merle, MD as Consulting Physician (Hematology) [name], CRNP as Nurse Practitioner (Nurse Practitioner) Lynnda Shields, Frances Nickels, DO as Referring Physician (Osteopathic Medicine)   Date of Service:  07/20/2017   CHIEF COMPLAINTS:  Follow up recurrent [place] and pain management   Oncology History   Cancer Staging Hepatocellular carcinoma [facility]) Staging form: Liver (Excluding Intrahepatic Bile Ducts), AJCC 7th Edition - Clinical stage from 04/16/2012: Stage II (T2(m), N0, M0) - Signed by Truitt Merle, MD on 10/12/2015       Hepatocellular carcinoma ([place])   09/14/2012 Tumor Marker    AFP 28.9      04/16/2013 Imaging    Abdominal MRI with and without contrast reviewed multifocal (4) liver lesions in both left and right lobes, most consistent with HCC, largest measuring 2.7 cm      04/16/2013 Initial Diagnosis    Hepatocellular carcinoma ([place])      04/28/2013 Procedure    Right TACE with lipiodol       06/15/2013 Procedure    Left TACE with [facility]       07/14/2013 Procedure    Right TACE with [facility]      09/05/2015 Imaging    CT abdomen with and without contrast showed a new 5.0 x 2.3 cm mass in the hepatic segment 8, invading and occluding the right anterior portal vein, most compatible with [place]. A portacaval node measuring 3.0 x 1.4 cm, previously 2.9 x 1.1 cm.      10/11/2015 Tumor Marker    AFP 5.3      11/11/2015 - 02/03/2016 Chemotherapy    sorafenib 200mg  bid started on 11/11/2015, increased to 400mg  bid in 2 weeks, stopped due to poor tolerance and probable disease progression       11/14/2015 Imaging    CT CAP 11/14/2015 IMPRESSION: Chest Impression: 1. RIGHT upper lobe pulmonary  nodule is indeterminate. No comparison available. 2. Ground-glass opacity and peripheral nodules in the RIGHT middle lobe appear post infectious or inflammatory.  Abdomen / Pelvis Impression: 1. 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 Thrombus also likely within the LEFT and RIGHT portal vein. Difficult to distinguish tumor thrombus versus bland thrombus. 2. Individual lesions are difficult to define in the RIGHT hepatic lobe. There is overall impression of progression of disease in the RIGHT hepatic lobe with multiple ill-defined enhancing lesions. 3. Periportal and shotty retroperitoneal adenopathy similar to prior.      01/25/2016 Imaging    CT Abdomen Pelvis w/ Contrast IMPRESSION: 1. Findings consistent with multifocal liver carcinoma with extensive portal vein thrombosis, and subsequent development of porta hepatis venous collaterals. Thrombus in the central portal vein is no longer expansile, but still expands the more peripheral portal vein and the right and left portal veins. There are few right liver bile ducts there are now dilated, which suggests mild progression of liver disease. 2. There is mild splenomegaly consistent or venous hypertension, which has mildly increased from the prior CT. 3. There are new lung abnormalities with interstitial thickening and ill-defined peribronchovascular nodular opacities mostly at the right lung base with a smaller area noted along the medial left lower lobe. The etiology may be  infectious. It may be inflammatory, possibly related to chemotherapy. Neoplastic disease is also possible. 4. There is no other evidence of metastatic disease within the abdomen or pelvis. No acute findings within the abdomen or pelvis      01/25/2016 - 01/25/2016 Hospital Admission    Pt was seen in ED for worsening abdominal pain, treated with pain meds        02/04/2016 -  Chemotherapy    Nivolumab 240mg  every 2 weeks, started on 02/04/2016; on 04/30/16 switched to every 3 weeks due to fatigue; change from 240mg  to 480mg  every 4 weeks starting 09/03/2016. Given his poor toleration, will reduce him to every 2 weeks at 240mg  starting 01/02/17. Changed to every 3 weeks on 04/09/17, and changed back to every 2 weeks on 06/11/2017. Postponed due to Suboxone reaction.         02/14/2016 Imaging    CT CAP w/ contrast 1. Slight interval increase in size of the mediastinal lymph nodes. 2. Persistent extensive lower lobe peribronchial thickening and patchy infiltrates. Possible aspiration pneumonia. 3. Persistent tree-in-bud appearance in the right middle lobe, likely chronic inflammation or atypical infection. 4. Improved CT appearance of the liver. Two small residual arterial phase enhancing lesions are identified. 5. Chronic portal vein thrombosis with cavernous transformation of portal venous collaterals. Esophageal varices are again noted. 6. Stable upper abdominal lymph nodes likely related to cirrhosis.       04/16/2016 Tumor Marker    AFP 4.1       04/17/2016 Imaging     CT CAP W PELVIS IMPRESSION: 1. Stable mild mediastinal lymphadenopathy. 2. Interval improvement in the bibasilar peribronchial thickening, airway impaction, and peribronchovascular nodularity. Imaging features suggest marked improvement in bilateral low the atypical infection. Changes in the right middle lobe with more stable and may represent scarring from previous infectious/inflammatory etiology. 3. The small hypervascular lesions seen in the anterior left liver on the previous study are not evident today. There is some subtle, ill-defined hyperenhancement in the central left liver which is nonspecific. Attention to this area on followup imaging recommended. 4. Chronic portal vein inclusion with cavernous transformation in the porta hepatis and paraesophageal varices. 5.  Stable appearance hepatoduodenal and retroperitoneal lymphadenopathy, likely chronic and related to liver disease.      04/17/2016 Imaging    CT CAP CONTRAST IMPRESSION: 1. Stable mild mediastinal lymphadenopathy. 2. Interval improvement in the bibasilar peribronchial thickening, airway impaction, and peribronchovascular nodularity. Imaging features suggest marked improvement in bilateral low the atypical infection. Changes in the right middle lobe with more stable and may represent scarring from previous infectious/inflammatory etiology. 3. The small hypervascular lesions seen in the anterior left liver on the previous study are not evident today. There is some subtle, ill-defined hyperenhancement in the central left liver which is nonspecific. Attention to this area on followup imaging recommended. 4. Chronic portal vein inclusion with cavernous transformation in the porta hepatis and paraesophageal varices. 5. Stable appearance hepatoduodenal and retroperitoneal lymphadenopathy, likely chronic and related to liver disease.      05/21/2016 Imaging    MR Abdomen W WO Contrast IMPRESSION: 1. Limited motion degraded scan. Cirrhosis. No evidence of a liver mass within these limitations. 2. Mild splenomegaly. No ascites. Stable mild paraumbilical and gastroesophageal varices. 3. Stable nonspecific mild retroperitoneal lymphadenopathy. 4. 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      06/04/2016 Tumor Marker    AFP 3.5       08/25/2016 Imaging  MR Abdomen w wo contrast  IMPRESSION: 1. Mild-to-moderate motion degradation, preferentially involving the pre and postcontrast dynamic images. On follow-up, given the extent of motion on this exam, multiphase CT may be preferred. 2. Given these limitations, no evidence of hepatocellular carcinoma. 3. Marked cirrhosis and portal venous hypertension. 4. Slight increase in abdominal  adenopathy, which is most likely reactive. Recommend attention on follow-up. 5. Cholelithiasis.       11/02/2016 Procedure    Upper endoscopy and colonoscopy  Diagnosis 1. Stomach, biopsy - MILD CHRONIC GASTRITIS. - NEGATIVE FOR HELICOBACTER PYLORI. - NO INTESTINAL METAPLASIA, DYSPLASIA, OR MALIGNANCY. 2. Colon, polyp(s), opposite ileocecal valve - TUBULAR ADENOMA. - NO HIGH GRADE DYSPLASIA OR MALIGNANCY. 3. Rectum, polyp(s) - HYPERPLASTIC POLYP. - NO DYSPLASIA OR MALIGNANCY.      01/08/2017 Imaging    CT Chest WO Contrast 01/08/17  IMPRESSION: 1. Stable chest CT. 2. Prominent to mildly enlarged mediastinal lymph nodes are stable. Based on stability, these are favored to be reactive. 3. Stable emphysema and right upper lobe nodularity. 4. Mild atherosclerosis.      01/08/2017 Imaging    MRI Abd W WO Contrast 01/08/17  IMPRESSION: 1. Cirrhotic liver now with severe iron deposition throughout the hepatic parenchyma, as well as areas of developing confluent hepatic fibrosis. Today's study clearly demonstrates 2 new hypervascular nodules in segment 4A of the liver, which demonstrate washout but no definite pseudocapsule, the largest which measures 1.5 cm. At this time, these are considered suspicious for hepatocellular carcinoma, bladder classified as Li-RADS 3 lesions. 2. 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 3. Cholelithiasis without evidence of acute cholecystitis at this time. 4. Mild splenomegaly.      03/08/2017 Imaging    MRI Abdomen W WO Contrast 03/08/17 IMPRESSION: 1. 15 mm hypervascular lesion identified on the previous study is stable in the interval measuring 14 mm today. A lesion of this size demonstrating arterial phase hypervascularity and washout is consistent with LI-RADS 4, suggestive of but not diagnostic for hepatoma. Follow-up MRI in 3 months may be warranted. 2. 9 mm hypervascular lesion identified in  segment IV on the previous study is not identified today. Attention on follow-up is recommended. 3. 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 4. Cholelithiasis.      06/01/2017 Imaging    CT CAP w contrast IMPRESSION: 1. Stable size of the dominant arterial phase enhancing lesion in segment 4a of the liver, currently measuring 1.4 by 1.0 cm. There is a questionable second focus measuring 0.7 by 0.4 cm in segment 2 of the liver. Both merit surveillance. 2. A porta hepatis lymph node has enlarged compared to the prior MRI, previously 1.2 cm and currently 2.0 cm. Although potentially reactive, a malignant lymph node could also demonstrate such size increase. This likewise merits careful surveillance. Otherwise is borderline adenopathy in the chest and abdomen. 3. Mild reduced nodularity associated with a right apical bandlike density, this appearance likewise merits surveillance. 4. Other imaging findings of potential clinical significance: Aortic Atherosclerosis (ICD10-I70.0). Emphysema (ICD10-J43.9). Airway thickening is present, suggesting bronchitis or reactive airways disease. Contracted gallbladder with small gallstones. Cavernous transformation of the portal vein with recanalized umbilical vein indicating portal venous hypertension. Cirrhosis. Capsular calcifications along the glenohumeral and hip joints.       HISTORY OF PRESENTING ILLNESS (10/11/2015):  Kevin Dillon 67 y.o. male with past medical history of treated hepatitis C, liver cirrhosis,  and hepatocellular carcinoma is here because of recurrent hepatocellular carcinoma. He is accompanied by his significant other [name] and her sister.  He was diagnosed with hepatocellular carcinoma in 2015, his initial image result is not available and staging is unknown. He was seen by interventional radiologist Dr. Delice Lesch at [facility] in  [place] and underwent TACE procedure 3 times in 2015. He was subsequently followed, and recently repeated CT scan showed a new 5.0 cm mass in segment 8, with portal vein invasion. He was felt not to be a candidate for further liver targeted therapy, and was referred to [place] for further systemic therapy.  He complains about mid epigastric pain for the past 2 years, which significantly improved after his TACE procedure in 2015. It has been getting worse lately in the past 6 months. He states he gets about 7-8 out of 10, persistent, he has been taking oxycodone 20 mg every 6 hours, but his pain is not well controlled. He is quite fatigued, is low appetite. He last 40 lbs in the pat 4 months. He is able to function at home, in trying to remain to be physically active.  His hepatitis C was successfully treated. He has history of hepatitis C, complicated with ascites and encephalopathy, but it has been well controlled with medical management.   CURRENT THERAPY:  Nivolumab 240mg  every 2 weeks, started on 02/04/2016; on 04/30/16 switched to every 3 weeks due to fatigue; change from 240mg  to 480mg  every 4 weeks starting 09/03/2016. Given his poor toleration, reduced back to every 2 weeks at 240mg  starting 01/02/17. Changed to every 3 weeks on 04/09/17, and changed back to every 2 weeks on 06/11/2017.   INTERIM HISTORY:   IZEL EISENHARDT is a 67 y.o. male who returns for follow up. He is here today by himself.  He feels moderately well overall.  He had episode of severe abdominal pain in the epigastric area last week, which lasted about several hours, he took oxycodone 3 pills, and finally resolved.  His chronic body pain remains to be stable, he takes oxycodone 15mg  4 times a day.  He saw my nurse practitioner Bella Kennedy last time, his Xanax was only refilled for 30 tablets, he is very unhappy about that.  And does not want see her anymore in the future.  He has chronic fatigue, although he is able to function at home.  He  complains of taste change since he took one dose of Suboxone, and low appetite.  His weight is stable.  He wants to see his PCP Dr. Lorenda Hatchet to refill oxycodone.  I referred him to pain specialist Dr. Wynetta Emery on his last visit, but he declined his appointment when Dr. Durenda Age office called him.   MEDICAL HISTORY:  Past Medical History:  Diagnosis Date  . Cancer ([place])    liver  . Cholelithiasis   . Chronic hepatitis C ([place])   . Chronic knee pain   . Chronic left shoulder pain   . CVA (cerebral infarction)   . Gout   . Hepatitis C    genotype 1b.  pt has been vaccinated against hep A and B.  . Hypertension   . Osteoarthritis   . Schatzki's ring   . Tubular adenoma of colon 12/2011    SURGICAL HISTORY: Past Surgical History:  Procedure Laterality Date  . AMPUTATION Left 09/17/2012   Procedure: SMALL FINGER EXTENSOR TENDON REPAIR; METACARPAL LEVEL AMPUTATION RING FINGER; PROXIMAL PHALANX LEVEL AMPUTATION LONG FINGER;  Surgeon: Sheral Apley  Burney Gauze, MD;  Location: [facility];  Service: Orthopedics;  Laterality: Left;  . Arm surgery     right/plate in arm  . BIOPSY  11/02/2016   Procedure: BIOPSY;  Surgeon: Daneil Dolin, MD;  [facility];  Service: Endoscopy;;  gastric   . COLONOSCOPY WITH ESOPHAGOGASTRODUODENOSCOPY (EGD)  12/30/2011   RMR: Noncritical Schatzki's ring;  Hiatal hernia, Tubular ADENOMA removed from splenic flexure, otherwise normal colonoscopy  . COLONOSCOPY WITH PROPOFOL N/A 11/02/2016   Procedure: COLONOSCOPY WITH PROPOFOL;  Surgeon: Daneil Dolin, MD;  [facility];  Service: Endoscopy;  Laterality: N/A;  1045  . ESOPHAGOGASTRODUODENOSCOPY (EGD) WITH PROPOFOL N/A 11/02/2016   Procedure: ESOPHAGOGASTRODUODENOSCOPY (EGD) WITH PROPOFOL;  Surgeon: Daneil Dolin, MD;  [facility];  Service: Endoscopy;  Laterality: N/A;  . IR FLUORO GUIDE PORT INSERTION RIGHT  10/21/2016  . IR US GUIDE VASC ACCESS RIGHT  10/21/2016  . LEG SURGERY      left  . POLYPECTOMY  11/02/2016   Procedure: POLYPECTOMY;  Surgeon: Daneil Dolin, MD;  [facility];  Service: Endoscopy;;  colon  . WOUND EXPLORATION Left 09/17/2012   Procedure: WOUND EXPLORATION;  Surgeon: Schuyler Amor, MD;  Location: [place];  Service: Orthopedics;  Laterality: Left;    SOCIAL HISTORY: Social History   Socioeconomic History  . Marital status: Significant Other    Spouse name: Not on file  . Number of children: 0  . Years of education: Not on file  . Highest education level: Not on file  Occupational History  . Occupation: disabled  Social Needs  . Financial resource strain: Not on file  . Food insecurity:    Worry: Not on file    Inability: Not on file  . Transportation needs:    Medical: Not on file    Non-medical: Not on file  Tobacco Use  . Smoking status: Former Smoker    Packs/day: 0.50    Years: 40.00    Pack years: 20.00    Types: Cigarettes    Last attempt to quit: 01/19/2012    Years since quitting: 5.5  . Smokeless tobacco: Never Used  . Tobacco comment: Smokes 1/2 pack of cigarettes daily  Substance and Sexual Activity  . Alcohol use: No    Comment: HX 2 beers 3-4 days per week; QUIT DEC 2013  . Drug use: No    Comment: Hx cocaine yrs ago, Last marijuana OCT 2013  . Sexual activity: Not Currently    Partners: Female  Lifestyle  . Physical activity:    Days per week: Not on file    Minutes per session: Not on file  . Stress: Not on file  Relationships  . Social connections:    Talks on phone: Not on file    Gets together: Not on file    Attends religious service: Not on file    Active member of club or organization: Not on file    Attends meetings of clubs or organizations: Not on file    Relationship status: Not on file  . Intimate partner violence:    Fear of current or ex partner: Not on file    Emotionally abused: Not on file    Physically abused: Not on file    Forced sexual activity: Not on file  Other  Topics Concern  . Not on file  Social History Narrative   Lives w/ significant other, [name]    FAMILY HISTORY: Family History  Problem Relation Age of Onset  . Cirrhosis Father 42  . Cancer Father        liver cancer   . Lung cancer Mother 59  . Colon cancer Neg Hx     ALLERGIES:  has No Known Allergies.  MEDICATIONS:  Current Outpatient Medications  Medication Sig Dispense Refill  . ALPRAZolam (XANAX) 1 MG tablet Take 1 tablet (1 mg total) by mouth 2 (two) times daily. 60 tablet 0  . DULoxetine (CYMBALTA) 30 MG capsule Take 1 capsule (30 mg total) by mouth daily. 30 capsule 3  . hydrALAZINE (APRESOLINE) 25 MG tablet Take 75 mg by mouth daily.     Marland Kitchen lactulose (CHRONULAC) 10 GM/15ML solution Take 30 g by mouth daily as needed for moderate constipation.     . lidocaine-prilocaine (EMLA) cream Apply 1 application topically as needed. Apply to portacath 1 1/2 hours - 2 hours prior to procedures as needed. 30 g 1  . megestrol (MEGACE) 40 MG/ML suspension Take 400 mg by mouth daily.     . Multiple Vitamins-Minerals (CENTRUM SILVER PO) Take 1 tablet by mouth daily.     Marland Kitchen oxyCODONE (ROXICODONE) 15 MG immediate release tablet Take 1 tablet (15 mg total) by mouth every 6 (six) hours as needed for pain. 60 tablet 0  . prochlorperazine (COMPAZINE) 10 MG tablet TAKE 1 TABLET BY MOUTH EVERY 8 HOURS AS NEEDED FOR NAUSEA/VOMITING 30 tablet 1  . rivaroxaban (XARELTO) 20 MG TABS tablet TAKE 1 TABLET BY MOUTH DAILY WITH SUPPER. 30 tablet 3   No current facility-administered medications for this visit.    Facility-Administered Medications Ordered in Other Visits  Medication Dose Route Frequency Provider Last Rate Last Dose  . sodium chloride flush (NS) 0.9 % injection 10 mL  10 mL Intracatheter PRN Truitt Merle, MD   10 mL at 07/20/17 1541    REVIEW OF SYSTEMS:  Constitutional: Denies fevers, chills or abnormal night sweats  (+) loss of appetite and taste Eyes: Denies blurriness of vision,  double vision or watery eyes Ears, nose, mouth, throat, and face: Denies mucositis or sore throat  Respiratory: Denies cough, dyspnea or wheezes Cardiovascular: Denies palpitation, chest discomfort or lower extremity swelling Gastrointestinal:  Denies nausea, heartburn (+) diffuse abdominal pain  Skin: Denies abnormal skin rashes Lymphatics: Denies new lymphadenopathy or easy bruising Neurological:Denies numbness, tingling or new weaknesses Behavioral/Psych: normal Musculoskeletal: (+) back pain, lower extremity myalgia pain  All other systems were reviewed with the patient and are negative.  PHYSICAL EXAMINATION  ECOG PERFORMANCE STATUS: 2  Vitals:   07/20/17 1300  BP: (!) 162/84  Pulse: 93  Resp: 20  Temp: 98 F (36.7 C)  SpO2: 100%   Filed Weights   07/20/17 1300  Weight: 193 lb 3.2 oz (87.6 kg)     GENERAL:alert, no distress and comfortable SKIN: skin color, texture, turgor are normal, no rashes or significant lesions EYES: normal, conjunctiva are pink and non-injected, sclera clear OROPHARYNX:no exudate, no erythema and lips, buccal mucosa, and tongue normal  NECK: supple, thyroid normal size, non-tender, without nodularity LYMPH:  no palpable lymphadenopathy in the cervical, axillary or inguinal LUNGS: clear to auscultation and percussion with normal breathing effort HEART: regular rate & rhythm and no murmurs and no lower extremity edema ABDOMEN:(+) abdominal bloating with mild RUQ tenderness Musculoskeletal:no cyanosis of digits and no clubbing (+) bilateral lower limb pain PSYCH: alert & oriented x 3 with fluent speech NEURO: no focal motor/sensory deficits  LABORATORY DATA:  I have  reviewed the data as listed CBC Latest Ref Rng & Units 07/20/2017 07/06/2017 06/18/2017  WBC 4.0 - 10.3 K/uL 8.0 8.3 9.1  Hemoglobin 13.0 - 17.1 g/dL 11.8(L) 12.6(L) 12.5(L)  Hematocrit 38.4 - 49.9 % 35.1(L) 36.8(L) 36.0(L)  Platelets 140 - 400 K/uL 170 203 181   CMP Latest Ref Rng &  Units 07/20/2017 07/06/2017 06/18/2017  Glucose 70 - 99 mg/dL 108(H) 95 112(H)  BUN 8 - 23 mg/dL 13 11 13   Creatinine 0.61 - 1.24 mg/dL 1.21 1.13 1.09  Sodium 135 - 145 mmol/L 137 140 138  Potassium 3.5 - 5.1 mmol/L 3.6 3.9 3.6  Chloride 98 - 111 mmol/L 107 108 107  CO2 22 - 32 mmol/L 23 23 23   Calcium 8.9 - 10.3 mg/dL 9.3 9.7 9.1  Total Protein 6.5 - 8.1 g/dL 7.3 7.8 8.1  Total Bilirubin 0.3 - 1.2 mg/dL 1.0 0.7 0.5  Alkaline Phos 38 - 126 U/L 156(H) 143 127(H)  AST 15 - 41 U/L 42(H) 30 29  ALT 0 - 44 U/L 30 18 15(L)    AFP:  09/14/2012: 28.9 10/11/2015: 5.3 11/01/2015: 3.7 12/02/2015: 3.7 01/22/2016: 3.4 02/18/2016: 7.1 03/17/16: 5.8 04/16/16: 4.1 06/04/2016: 3.5 07/16/2016:4.4 08/27/2016: 5.6 10/08/16: 4.3 11/05/16: 4.8 12/03/16: 5.4 01/01/17: 5.1 01/29/17: 4.5 02/26/17: 5.5 04/09/17: 4.0 04/30/17: 3.3 06/11/17: 4.0 07/06/17: 4.4   PATHOLOGY   Diagnosis 11/02/16  1. Stomach, biopsy - MILD CHRONIC GASTRITIS. - NEGATIVE FOR HELICOBACTER PYLORI. - NO INTESTINAL METAPLASIA, DYSPLASIA, OR MALIGNANCY. 2. Colon, polyp(s), opposite ileocecal valve - TUBULAR ADENOMA. - NO HIGH GRADE DYSPLASIA OR MALIGNANCY. 3. Rectum, polyp(s) - HYPERPLASTIC POLYP. - NO DYSPLASIA OR MALIGNANCY. Microscopic Comment 1. A Warthin-Starry stain is performed to determine the possibility of the presence of Helicobacter pylori. The Warthin-Starry stain is negative for organisms of Helicobacter pylori.   PROCEDURE  Upper Endoscopy by Dr. [name] 11/02/16  IMPRESSION:  - mildly obstructing Schatzki ring. - dilated with scope passage. erosive reflux esophagitis - Small hiatal hernia. - Portal hypertensive gastropathy. biopsied - Normal duodenal bulb and second portion of the duodenum. Biopsied.   Colonoscopy by Dr. [name] 11/02/16  IMPRESSION:  - Two 3 to 5 mm polyps in the rectum and at the ileocecal valve, removed with a cold snare. Resected and retrieved. - Diverticulosis in the sigmoid colon and in  the descending colon. - The examination was otherwise normal on direct and retroflexion views.   RADIOGRAPHIC STUDIES: I have personally reviewed the radiological images as listed and agreed with the findings in the report.  No results found.  04/17/16 CT CAP W PELVIS IMPRESSION: 1. Stable mild mediastinal lymphadenopathy. 2. Interval improvement in the bibasilar peribronchial thickening, airway impaction, and peribronchovascular nodularity. Imaging features suggest marked improvement in bilateral low the atypical infection. Changes in the right middle lobe with more stable and may represent scarring from previous infectious/inflammatory etiology. 3. The small hypervascular lesions seen in the anterior left liver on the previous study are not evident today. There is some subtle, ill-defined hyperenhancement in the central left liver which is nonspecific. Attention to this area on followup imaging recommended. 4. Chronic portal vein inclusion with cavernous transformation in the porta hepatis and paraesophageal varices. 5. Stable appearance hepatoduodenal and retroperitoneal lymphadenopathy, likely chronic and related to liver disease.   MR Abdomen W WO Contrast 05/21/16 IMPRESSION: 1. Limited motion degraded scan. Cirrhosis. No evidence of a liver mass within these limitations. 2. Mild splenomegaly. No ascites. Stable mild paraumbilical and gastroesophageal varices. 3. Stable nonspecific mild retroperitoneal  lymphadenopathy. 4. 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  MR Abdomen w wo contrast 08/25/2016 IMPRESSION: 1. Mild-to-moderate motion degradation, preferentially involving the pre and postcontrast dynamic images. On follow-up, given the extent of motion on this exam, multiphase CT may be preferred. 2. Given these limitations, no evidence of hepatocellular carcinoma. 3. Marked cirrhosis and portal venous hypertension. 4.  Slight increase in abdominal adenopathy, which is most likely reactive. Recommend attention on follow-up. 5. Cholelithiasis.  CT Chest WO Contrast 01/08/17  IMPRESSION: 1. Stable chest CT. 2. Prominent to mildly enlarged mediastinal lymph nodes are stable. Based on stability, these are favored to be reactive. 3. Stable emphysema and right upper lobe nodularity. 4. Mild atherosclerosis.  MRI Abd W WO Contrast 01/08/17  IMPRESSION: 1. Cirrhotic liver now with severe iron deposition throughout the hepatic parenchyma, as well as areas of developing confluent hepatic fibrosis. Today's study clearly demonstrates 2 new hypervascular nodules in segment 4A of the liver, which demonstrate washout but no definite pseudocapsule, the largest which measures 1.5 cm. At this time, these are considered suspicious for hepatocellular carcinoma, bladder classified as Li-RADS 3 lesions. 2. 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 3. Cholelithiasis without evidence of acute cholecystitis at this time. 4. Mild splenomegaly.  MRI Abdomen W WO Contrast 03/08/17 IMPRESSION: 1. 15 mm hypervascular lesion identified on the previous study is stable in the interval measuring 14 mm today. A lesion of this size demonstrating arterial phase hypervascularity and washout is consistent with LI-RADS 4, suggestive of but not diagnostic for hepatoma. Follow-up MRI in 3 months may be warranted. 2. 9 mm hypervascular lesion identified in segment IV on the previous study is not identified today. Attention on follow-up is recommended. 3. 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 4. Cholelithiasis.  ASSESSMENT & PLAN:  Kevin Dillon is a 67 y.o. Caucasian male with past medical history of successfully treated hepatitis C, liver cirrhosis, history of ascites and encephalopathy, recurrent  HCC   1. Recurrent HCC, initially stage II, recurrent in 2017  -I previously reviewed his previous image and outside medical records extensively, confirmed key findings: He initially had a multifocal disease, status post TACE 3 times in 2015 -he developed symptomatic local recurrence in 08/2015, with a 5cm new lesion in segment 8 with direct invasion into portal vein, and a portocaval lymph node. He is not a candidate for liver targeted therapy per his IR Dr. Delice Lesch -He was started on first line sorafenib, tolerated poorly. His previous CT abdomen and pelvis on 01/25/2016 revealed extensive portal hypertension, and a probable cancer progression in the liver. -His treatment was changed to immunotherapy Nivolumab on 02/04/16, he tolerated very well, and clinically he was doing much better, with less pain and better energy.  -I previously discussed his restaging CT scan which was done on 02/14/2016, it actually showed decreased liver cancer size compared to the scan a month ago. No other new metastasis. -restaging CT from 04/17/2016 showed no visible liver mass, but the imaging was very limited due to his liver cirrhosis  -I previously reviewed his recent abdominal MRI from 05/21/2016, which showed no visible mass in the liver. He has had complete radiographic response. -I reviewed his restaging MRI from 08/25/2016, which showed no visible liver mass, but the images quality with significant compromised due to the motion.  -Nivo has previously been changed to 480mg   every 4 weeks in 08/2016. Due to poor toleration (fatigue) I changed his nivo back to 240mg  every 2 weeks starting 01/02/17.  -We discussed his MRI abd and CT chest from 01/08/17 which showed no concern for metastatic disease in the chest. MRI shows two small new lesions in his liver. This was reviewed in tumor board, felt to be indeterminate and close f/u with imaging was recommended -MRI Abdomen from 03/08/17 reveals that his 15 mm hypervascular lesion  identified on the previous study is stable in the interval measuring 14 mm, demonstrating arterial phase hypervascularity and washout is consistent with LI-RADS 4, suggestive of but not diagnostic for hepatoma, 9 mm hypervascular lesion identified in segment IV on the previous study is not identified today. I previously discussed results with pt. I recommended continue Nivolumab. -On 04/09/17, he requested to change treatment to every 3 weeks at 240 mg dose due to the fatigue. He knows the standard is every 2 weeks, but I will accommodated his request. -CT CAP w contrast on 06/01/2017 showed: Stable size of the dominant arterial phase enhancing lesion in segment 4a of the liver, currently measuring 1.4 by 1.0 cm. There is a questionable second focus measuring 0.7 by 0.4 cm in segment 2 of the liver. Both merit surveillance. A portal hepatis lymph node has enlarged compared to the prior MRI, previously 1.2 cm and currently 2.0 cm. Although potentially reactive, a malignant lymph node could also demonstrate such size increase. This likewise merits careful surveillance. Otherwise is borderline adenopathy in the chest and abdomen. Mild reduced nodularity associated with a right apical bandlike density, this appearance likewise merits surveillance. -Due to the probably slow disease progression, I recommended pt to have nivolumab q 2 weeks instead of  q 3 weeks on 06/11/17. He agreed.  -We reviewed his case in our GI tumor board recently, the consensus is continue to Nivolumab, given no significant disease progression on image. -Since he presented to the ED on 06/18/17 due to reaction to Suboxone he skipped a few Nivo treatment, he did restart on 07/06/17 -He is clinically stable, will continue to Nivolumab every 2 weeks.  He wants to change to every 3 weeks again, I encouraged him to stay on every 2 weeks for now. -plan to repeat restaging scan in late August or early Sep    2. Abdominal pain, low back pain -His  abdominal pain previously improved since he started treatment. With oxycodone and OxyContin, he was on very high dose and overall pain was controlled  -I previously prescribed flexeril in attempt to help with his pain. Due to his high tolerance, he can take up to 2 a day. -His prior constipation is likely related to his narcotic use, he uses colace and I advised him he can use Miralax or Senokot that is stronger.  -I previously had a lengthy conversation with pt about reducing his narcotic pain medication and the risks associated with taking high dose (including shortening his life) for a long period of time. His cancer is well controlled currently and it should not cause him a lot of pain. I am concerned that he has narcotic addition, and his depression/anxiety may also contribute to his pain. He is reluctant to reducing his dose. I offered Cymbalta on 06/11/17 and he agreed to try. Our social worker Webb Silversmith will see him same day and I previously recommended that he see psychiatrist someone to help manage his depression/anxiety/addiction.  -He was recently prescribed Suboxone on 06/17/17 by Dr. Rachael Fee.  This reacted to Suboxone and had to go to ED the next day. He last took Suboxone on 06/21/17 7:00am. I explained Suboxone removed prior high tolerance to pain medication. -I discussed his pain management will likely be long term at this point. I discussed his options in great detail with him and his wife. He can return to Dr. Rachael Fee who plans to change his Suboxone to Subotex to prevent another reaction. Pt declined firmly   His other option is for me to send a referral to another pain specialist. He declined at this point  -I agree to write his pain meds, but will start him on very low-dose, I started him on oxycodone 5 mg every 6 hours as needed. He states that it's not helping his pain, but he also does not show any signs of withdraw -I filled his oxycodone 10mg , Q6H PRN,  #30 today I discussed  the option of trying Cymbalta again as this is not addictive. He notes this made him nauseous before but is willing to try.  -I discussed we need to find his root cased of pain as this is not solely cancer related pain. I explained I would like him to get treatment for his depression, and probable addiction, to help improve his overall feeling. He voiced good understanding, but he declined pain clinic referral -I will continue his oxycodone 15 mg every 6 hours as needed, I do not plan to increase the dose unless he has disease progression or injury  -Patient states he will ask his primary care physician Dr. Landry Corporal to prescribe his pain meds, I did call Dr. Sheryle Hail and updated him about his recent pain management issue   3. Portal vein thrombosis -continue Xarelto  -We previously discussed the moderate to high risk of bleeding with Xarelto due to his liver cirrhosis and he knows to avoid injury and fall  -if he develops anemia or GI bleeding again, will stop Xarelto  4 Liver cirrhosis secondary to hepatitis C and alcohol, history of encephalopathy and ascites -He will continue follow-up with [facility], his ascites has been well controlled with diuretics, but seems getting worse lately, I previously encourage him to follow up with [facility]  -He knows to use laxative, especially lactulose for his constipation -Given Lasix by [facility] to help his ascites. Since improving I suggest he reduce down to half tablet or once every other day.  -he will continue to follow up with [facility]  -He reports that he is not taking lasix as prescribed at this time.   5. Hep C, successfully treated -Continue follow-up with liver clinic NP [facility]  6. Anxiety and insomnia  -He has Xanax for anxiety.  -continue Ativan 1 mg at bedtime as needed for insomnia, he knows not to take with Xanax together, he knows not to take during the day. He does not feel Ativan is working well for sleep -I refilled his Xanax on 06/21/17 and advised him to  take it once daily for now.  -I increased and filled his Xanax to 1mg  BID today (06/29/17)   7. Fatigue and weight gain/loss -He reported more fatigue and weakness since his narcotics dose has been significantly decreased -His TSH was previously elevated but T3 and T4 levels were normal. We discussed the side effect of hypothyroidism from Nivolumab, I previously referred him to Dr. Loanne Drilling for evaluation to see if he needs low-dose Synthroid but he did not go. Will continue monitoring  -I did not suspect his weight gain is  related to fluid retention. I previously suggested he hold Megace and reduce ensure boosts to once daily. He should use a balanced diet and exercise to help.  -Given recent reaction to Suboxone he has not been eating much and loss 10 pounds in a little over a week.  -He has started gaining some weight back. Will continue to monitor.   8. HTN -He is on Hydralazine 50 mg daily, and follow-up of his primary care physician  9. Smoking  - Patient has started back smoking due to the fear of not getting better - I previously recommended a smoking cessation program. He refused.  10. Depression and anxiety  - The patient previously reports feelings of depression related to his diagnosis. -I am worried that he feels hopeless, he was seen by our social worker Webb Silversmith before who has referred him to a psychiatrist [facility] -He notes being depressed due to his limited abilities. He denies thoughts of suicide or self harm.  -I sent referral to behavioral health (06/29/17), and he has an appointment in August   11. Iron deficient anemia  -10/16/16 lab results was consistent with iron deficiency, possible related to occluded GI bleeding -He is on Xarelto, no clinical overt bleeding. -He received IV feraheme 10/16/16 and in 10/2016  -He had GI work up with upper endoscopy and colonoscopy per Dr. [name] on 11/02/16. Biopsy of the stomach showed mild chronic gastritis, negative for H.  Pylori or malignancy. Colon and rectal polyps were negative for dysplasia or malignancy. He will f/u with GI again in 01/2017.  -if he develops anemia or GI bleeding again, will stop Xarelto   12. Goal of care discussion, DNR/DNI  -We previously discussed the incurable nature of his cancer, and the overall poor prognosis, especially if he does not have good response to chemotherapy or progress on chemo -The patient understands the goal of care is palliative. -he agrees with DNR/DNI    Plan -Xanax and Xarelto were refilled today -Lab reviewed, adequate for treatment, will proceed Nivolumab today and continue every 2 weeks -I will see him back in 2 weeks -Spoke with his primary care physician Dr. Landry Corporal about his pain management   All questions were answered. The patient knows to call the clinic with any problems, questions or concerns.  I spent 25 minutes counseling the patient face to face. Pt's partner had many questions, and I answered to her satisfaction.  The total time spent in the appointment was 30  minutes and more than 50% was on counseling.  Dierdre Searles Dweik am acting as scribe for Dr. Truitt Merle.  I have reviewed the above documentation for accuracy and completeness, and I agree with the above.    Truitt Merle  07/20/2017 4:51 PM

## 2017-07-19 DIAGNOSIS — C220 Liver cell carcinoma: Secondary | ICD-10-CM | POA: Diagnosis not present

## 2017-07-19 DIAGNOSIS — K7469 Other cirrhosis of liver: Secondary | ICD-10-CM | POA: Diagnosis not present

## 2017-07-20 ENCOUNTER — Telehealth: Payer: Self-pay

## 2017-07-20 ENCOUNTER — Telehealth: Payer: Self-pay | Admitting: Hematology

## 2017-07-20 ENCOUNTER — Inpatient Hospital Stay: Payer: Medicare Other

## 2017-07-20 ENCOUNTER — Encounter: Payer: Self-pay | Admitting: Hematology

## 2017-07-20 ENCOUNTER — Inpatient Hospital Stay: Payer: Medicare Other | Attending: Hematology | Admitting: Hematology

## 2017-07-20 VITALS — BP 162/84 | HR 93 | Temp 98.0°F | Resp 20 | Ht 73.0 in | Wt 193.2 lb

## 2017-07-20 DIAGNOSIS — Z5112 Encounter for antineoplastic immunotherapy: Secondary | ICD-10-CM | POA: Diagnosis not present

## 2017-07-20 DIAGNOSIS — G4700 Insomnia, unspecified: Secondary | ICD-10-CM | POA: Insufficient documentation

## 2017-07-20 DIAGNOSIS — I10 Essential (primary) hypertension: Secondary | ICD-10-CM

## 2017-07-20 DIAGNOSIS — I81 Portal vein thrombosis: Secondary | ICD-10-CM | POA: Diagnosis not present

## 2017-07-20 DIAGNOSIS — F329 Major depressive disorder, single episode, unspecified: Secondary | ICD-10-CM | POA: Insufficient documentation

## 2017-07-20 DIAGNOSIS — B182 Chronic viral hepatitis C: Secondary | ICD-10-CM | POA: Insufficient documentation

## 2017-07-20 DIAGNOSIS — G893 Neoplasm related pain (acute) (chronic): Secondary | ICD-10-CM | POA: Diagnosis not present

## 2017-07-20 DIAGNOSIS — D509 Iron deficiency anemia, unspecified: Secondary | ICD-10-CM | POA: Diagnosis not present

## 2017-07-20 DIAGNOSIS — Z79899 Other long term (current) drug therapy: Secondary | ICD-10-CM | POA: Insufficient documentation

## 2017-07-20 DIAGNOSIS — R530 Neoplastic (malignant) related fatigue: Secondary | ICD-10-CM | POA: Insufficient documentation

## 2017-07-20 DIAGNOSIS — F419 Anxiety disorder, unspecified: Secondary | ICD-10-CM | POA: Insufficient documentation

## 2017-07-20 DIAGNOSIS — C220 Liver cell carcinoma: Secondary | ICD-10-CM

## 2017-07-20 DIAGNOSIS — Z800 Family history of malignant neoplasm of digestive organs: Secondary | ICD-10-CM | POA: Insufficient documentation

## 2017-07-20 DIAGNOSIS — T451X5A Adverse effect of antineoplastic and immunosuppressive drugs, initial encounter: Secondary | ICD-10-CM

## 2017-07-20 DIAGNOSIS — K5903 Drug induced constipation: Secondary | ICD-10-CM | POA: Insufficient documentation

## 2017-07-20 DIAGNOSIS — K7031 Alcoholic cirrhosis of liver with ascites: Secondary | ICD-10-CM | POA: Insufficient documentation

## 2017-07-20 DIAGNOSIS — Z7901 Long term (current) use of anticoagulants: Secondary | ICD-10-CM | POA: Diagnosis not present

## 2017-07-20 DIAGNOSIS — Z87891 Personal history of nicotine dependence: Secondary | ICD-10-CM | POA: Diagnosis not present

## 2017-07-20 DIAGNOSIS — M545 Low back pain: Secondary | ICD-10-CM | POA: Diagnosis not present

## 2017-07-20 DIAGNOSIS — D6481 Anemia due to antineoplastic chemotherapy: Secondary | ICD-10-CM

## 2017-07-20 DIAGNOSIS — D500 Iron deficiency anemia secondary to blood loss (chronic): Secondary | ICD-10-CM

## 2017-07-20 DIAGNOSIS — Z66 Do not resuscitate: Secondary | ICD-10-CM | POA: Diagnosis not present

## 2017-07-20 DIAGNOSIS — Z8619 Personal history of other infectious and parasitic diseases: Secondary | ICD-10-CM

## 2017-07-20 DIAGNOSIS — K7469 Other cirrhosis of liver: Secondary | ICD-10-CM

## 2017-07-20 LAB — CBC WITH DIFFERENTIAL (CANCER CENTER ONLY)
Basophils Absolute: 0 10*3/uL (ref 0.0–0.1)
Basophils Relative: 1 %
EOS PCT: 2 %
Eosinophils Absolute: 0.2 10*3/uL (ref 0.0–0.5)
HCT: 35.1 % — ABNORMAL LOW (ref 38.4–49.9)
Hemoglobin: 11.8 g/dL — ABNORMAL LOW (ref 13.0–17.1)
LYMPHS ABS: 2.3 10*3/uL (ref 0.9–3.3)
Lymphocytes Relative: 29 %
MCH: 29.6 pg (ref 27.2–33.4)
MCHC: 33.7 g/dL (ref 32.0–36.0)
MCV: 87.6 fL (ref 79.3–98.0)
Monocytes Absolute: 0.6 10*3/uL (ref 0.1–0.9)
Monocytes Relative: 8 %
Neutro Abs: 4.9 10*3/uL (ref 1.5–6.5)
Neutrophils Relative %: 60 %
PLATELETS: 170 10*3/uL (ref 140–400)
RBC: 4.01 MIL/uL — AB (ref 4.20–5.82)
RDW: 13.9 % (ref 11.0–14.6)
WBC: 8 10*3/uL (ref 4.0–10.3)

## 2017-07-20 LAB — CMP (CANCER CENTER ONLY)
ALT: 30 U/L (ref 0–44)
AST: 42 U/L — AB (ref 15–41)
Albumin: 4.1 g/dL (ref 3.5–5.0)
Alkaline Phosphatase: 156 U/L — ABNORMAL HIGH (ref 38–126)
Anion gap: 7 (ref 5–15)
BUN: 13 mg/dL (ref 8–23)
CALCIUM: 9.3 mg/dL (ref 8.9–10.3)
CO2: 23 mmol/L (ref 22–32)
Chloride: 107 mmol/L (ref 98–111)
Creatinine: 1.21 mg/dL (ref 0.61–1.24)
Glucose, Bld: 108 mg/dL — ABNORMAL HIGH (ref 70–99)
Potassium: 3.6 mmol/L (ref 3.5–5.1)
SODIUM: 137 mmol/L (ref 135–145)
TOTAL PROTEIN: 7.3 g/dL (ref 6.5–8.1)
Total Bilirubin: 1 mg/dL (ref 0.3–1.2)

## 2017-07-20 MED ORDER — SODIUM CHLORIDE 0.9% FLUSH
10.0000 mL | Freq: Once | INTRAVENOUS | Status: AC
  Administered 2017-07-20: 10 mL
  Filled 2017-07-20: qty 10

## 2017-07-20 MED ORDER — SODIUM CHLORIDE 0.9 % IV SOLN
Freq: Once | INTRAVENOUS | Status: AC
  Administered 2017-07-20: 14:00:00 via INTRAVENOUS

## 2017-07-20 MED ORDER — HEPARIN SOD (PORK) LOCK FLUSH 100 UNIT/ML IV SOLN
500.0000 [IU] | Freq: Once | INTRAVENOUS | Status: AC | PRN
  Administered 2017-07-20: 500 [IU]
  Filled 2017-07-20: qty 5

## 2017-07-20 MED ORDER — SODIUM CHLORIDE 0.9 % IV SOLN
240.0000 mg | Freq: Once | INTRAVENOUS | Status: AC
  Administered 2017-07-20: 240 mg via INTRAVENOUS
  Filled 2017-07-20: qty 24

## 2017-07-20 MED ORDER — SODIUM CHLORIDE 0.9% FLUSH
10.0000 mL | INTRAVENOUS | Status: DC | PRN
  Administered 2017-07-20: 10 mL
  Filled 2017-07-20: qty 10

## 2017-07-20 MED ORDER — ALPRAZOLAM 1 MG PO TABS: 1.0000 mg | ORAL_TABLET | Freq: Two times a day (BID) | ORAL | 0 refills | Status: DC

## 2017-07-20 MED ORDER — RIVAROXABAN 20 MG PO TABS: ORAL_TABLET | ORAL | 3 refills | Status: AC

## 2017-07-20 NOTE — Patient Instructions (Signed)
[facility] Discharge Instructions for Patients Receiving Chemotherapy  Today you received the following chemotherapy agents :  Opdivo.  To help prevent nausea and vomiting after your treatment, we encourage you to take your nausea medication as prescribed.   If you develop nausea and vomiting that is not controlled by your nausea medication, call the clinic.   BELOW ARE SYMPTOMS THAT SHOULD BE REPORTED IMMEDIATELY:  *FEVER GREATER THAN 100.5 F  *CHILLS WITH OR WITHOUT FEVER  NAUSEA AND VOMITING THAT IS NOT CONTROLLED WITH YOUR NAUSEA MEDICATION  *UNUSUAL SHORTNESS OF BREATH  *UNUSUAL BRUISING OR BLEEDING  TENDERNESS IN MOUTH AND THROAT WITH OR WITHOUT PRESENCE OF ULCERS  *URINARY PROBLEMS  *BOWEL PROBLEMS  UNUSUAL RASH Items with * indicate a potential emergency and should be followed up as soon as possible.  Feel free to call the clinic should you have any questions or concerns. The clinic phone number is [phone].  Please show the CHEMO ALERT CARD at check-in to the Emergency Department and triage nurse.   

## 2017-07-20 NOTE — Telephone Encounter (Signed)
No LOS 7/2 °

## 2017-07-20 NOTE — Telephone Encounter (Signed)
Left message with Dr. Wynelle Link Hassan's office to have him call Dr. Burr Medico on her cell phone.

## 2017-07-21 DIAGNOSIS — J449 Chronic obstructive pulmonary disease, unspecified: Secondary | ICD-10-CM | POA: Diagnosis not present

## 2017-07-21 DIAGNOSIS — F419 Anxiety disorder, unspecified: Secondary | ICD-10-CM | POA: Diagnosis not present

## 2017-07-21 DIAGNOSIS — Z0001 Encounter for general adult medical examination with abnormal findings: Secondary | ICD-10-CM | POA: Diagnosis not present

## 2017-07-21 DIAGNOSIS — I10 Essential (primary) hypertension: Secondary | ICD-10-CM | POA: Diagnosis not present

## 2017-07-28 MED FILL — ALPRAZolam 1 MG TABS: 1 | 30 days supply | Qty: 60 | Fill #0

## 2017-08-04 ENCOUNTER — Other Ambulatory Visit: Payer: Medicare Other

## 2017-08-04 ENCOUNTER — Ambulatory Visit: Payer: Medicare Other

## 2017-08-04 ENCOUNTER — Ambulatory Visit: Payer: Medicare Other | Admitting: Hematology

## 2017-08-10 MED FILL — XARELTO 20 MG TABLET: 20 | 30 days supply | Qty: 30 | Fill #0

## 2017-08-13 ENCOUNTER — Telehealth: Payer: Self-pay | Admitting: Student in an Organized Health Care Education/Training Program

## 2017-08-13 ENCOUNTER — Telehealth: Payer: Self-pay

## 2017-08-13 NOTE — Telephone Encounter (Signed)
Patient calls to let us know he will not be coming back to Dr. Burr Medico.  He is searching for another doctor.    He desires me to cancel his appointments.

## 2017-08-13 NOTE — Telephone Encounter (Signed)
All future appointments cancelled per 7/26 [employer] scheduling message

## 2017-08-17 ENCOUNTER — Ambulatory Visit: Payer: Medicare Other | Admitting: Nurse Practitioner

## 2017-08-17 ENCOUNTER — Other Ambulatory Visit: Payer: Medicare Other

## 2017-08-17 ENCOUNTER — Ambulatory Visit: Payer: Medicare Other

## 2017-08-20 DIAGNOSIS — M542 Cervicalgia: Secondary | ICD-10-CM | POA: Diagnosis not present

## 2017-08-20 DIAGNOSIS — I10 Essential (primary) hypertension: Secondary | ICD-10-CM | POA: Diagnosis not present

## 2017-08-20 DIAGNOSIS — Z79899 Other long term (current) drug therapy: Secondary | ICD-10-CM | POA: Diagnosis not present

## 2017-08-20 DIAGNOSIS — J449 Chronic obstructive pulmonary disease, unspecified: Secondary | ICD-10-CM | POA: Diagnosis not present

## 2017-08-26 ENCOUNTER — Telehealth: Payer: Self-pay | Admitting: Hematology

## 2017-08-26 NOTE — Telephone Encounter (Signed)
Spoke w/ pt and confirmed appt for tomorrow

## 2017-08-26 NOTE — Progress Notes (Signed)
[place]  [phone] [phone]  Clinic Follow up Note   Patient Care Team: Antonietta Jewel, MD as PCP - General (Internal Medicine) [name], Cristopher Estimable, MD as Attending Physician (Gastroenterology) Antonietta Jewel, MD as Referring Physician (Internal Medicine) Truitt Merle, MD as Consulting Physician (Hematology) [name], CRNP as Nurse Practitioner (Nurse Practitioner) Lynnda Shields, Frances Nickels, DO as Referring Physician (Osteopathic Medicine)   Date of Service:  08/27/2017   CHIEF COMPLAINTS:  Follow up recurrent [place] and pain management   Oncology History   Cancer Staging Hepatocellular carcinoma [facility]) Staging form: Liver (Excluding Intrahepatic Bile Ducts), AJCC 7th Edition - Clinical stage from 04/16/2012: Stage II (T2(m), N0, M0) - Signed by Truitt Merle, MD on 10/12/2015       Hepatocellular carcinoma ([place])   09/14/2012 Tumor Marker    AFP 28.9    04/16/2013 Imaging    Abdominal MRI with and without contrast reviewed multifocal (4) liver lesions in both left and right lobes, most consistent with HCC, largest measuring 2.7 cm    04/16/2013 Initial Diagnosis    Hepatocellular carcinoma ([place])    04/28/2013 Procedure    Right TACE with lipiodol     06/15/2013 Procedure    Left TACE with [facility]     07/14/2013 Procedure    Right TACE with [facility]    09/05/2015 Imaging    CT abdomen with and without contrast showed a new 5.0 x 2.3 cm mass in the hepatic segment 8, invading and occluding the right anterior portal vein, most compatible with [place]. A portacaval node measuring 3.0 x 1.4 cm, previously 2.9 x 1.1 cm.    10/11/2015 Tumor Marker    AFP 5.3    11/11/2015 - 02/03/2016 Chemotherapy    sorafenib 200mg  bid started on 11/11/2015, increased to 400mg  bid in 2 weeks, stopped due to poor tolerance and probable disease progression     11/14/2015 Imaging    CT CAP 11/14/2015 IMPRESSION: Chest Impression: 1. RIGHT upper lobe pulmonary nodule is indeterminate. No  comparison available. 2. Ground-glass opacity and peripheral nodules in the RIGHT middle lobe appear post infectious or inflammatory.  Abdomen / Pelvis Impression: 1. 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 Thrombus also likely within the LEFT and RIGHT portal vein. Difficult to distinguish tumor thrombus versus bland thrombus. 2. Individual lesions are difficult to define in the RIGHT hepatic lobe. There is overall impression of progression of disease in the RIGHT hepatic lobe with multiple ill-defined enhancing lesions. 3. Periportal and shotty retroperitoneal adenopathy similar to prior.    01/25/2016 Imaging    CT Abdomen Pelvis w/ Contrast IMPRESSION: 1. Findings consistent with multifocal liver carcinoma with extensive portal vein thrombosis, and subsequent development of porta hepatis venous collaterals. Thrombus in the central portal vein is no longer expansile, but still expands the more peripheral portal vein and the right and left portal veins. There are few right liver bile ducts there are now dilated, which suggests mild progression of liver disease. 2. There is mild splenomegaly consistent or venous hypertension, which has mildly increased from the prior CT. 3. There are new lung abnormalities with interstitial thickening and ill-defined peribronchovascular nodular opacities mostly at the right lung base with a smaller area noted along the medial left lower lobe. The etiology may be infectious. It may be inflammatory, possibly related to chemotherapy. Neoplastic disease is also possible. 4. There is no other evidence  of metastatic disease within the abdomen or pelvis. No acute findings within the abdomen or pelvis    01/25/2016 - 01/25/2016 Hospital Admission    Pt was seen in ED for worsening abdominal pain, treated with pain meds     02/04/2016 -  Chemotherapy    Nivolumab  240mg  every 2 weeks, started on 02/04/2016; on 04/30/16 switched to every 3 weeks due to fatigue; change from 240mg  to 480mg  every 4 weeks starting 09/03/2016. Given his poor toleration, will reduce him to every 2 weeks at 240mg  starting 01/02/17. Changed to every 3 weeks on 04/09/17, and changed back to every 2 weeks on 06/11/2017. Postponed due to Suboxone reaction.       02/14/2016 Imaging    CT CAP w/ contrast 1. Slight interval increase in size of the mediastinal lymph nodes. 2. Persistent extensive lower lobe peribronchial thickening and patchy infiltrates. Possible aspiration pneumonia. 3. Persistent tree-in-bud appearance in the right middle lobe, likely chronic inflammation or atypical infection. 4. Improved CT appearance of the liver. Two small residual arterial phase enhancing lesions are identified. 5. Chronic portal vein thrombosis with cavernous transformation of portal venous collaterals. Esophageal varices are again noted. 6. Stable upper abdominal lymph nodes likely related to cirrhosis.     04/16/2016 Tumor Marker    AFP 4.1     04/17/2016 Imaging     CT CAP W PELVIS IMPRESSION: 1. Stable mild mediastinal lymphadenopathy. 2. Interval improvement in the bibasilar peribronchial thickening, airway impaction, and peribronchovascular nodularity. Imaging features suggest marked improvement in bilateral low the atypical infection. Changes in the right middle lobe with more stable and may represent scarring from previous infectious/inflammatory etiology. 3. The small hypervascular lesions seen in the anterior left liver on the previous study are not evident today. There is some subtle, ill-defined hyperenhancement in the central left liver which is nonspecific. Attention to this area on followup imaging recommended. 4. Chronic portal vein inclusion with cavernous transformation in the porta hepatis and paraesophageal varices. 5. Stable appearance hepatoduodenal and  retroperitoneal lymphadenopathy, likely chronic and related to liver disease.    04/17/2016 Imaging    CT CAP CONTRAST IMPRESSION: 1. Stable mild mediastinal lymphadenopathy. 2. Interval improvement in the bibasilar peribronchial thickening, airway impaction, and peribronchovascular nodularity. Imaging features suggest marked improvement in bilateral low the atypical infection. Changes in the right middle lobe with more stable and may represent scarring from previous infectious/inflammatory etiology. 3. The small hypervascular lesions seen in the anterior left liver on the previous study are not evident today. There is some subtle, ill-defined hyperenhancement in the central left liver which is nonspecific. Attention to this area on followup imaging recommended. 4. Chronic portal vein inclusion with cavernous transformation in the porta hepatis and paraesophageal varices. 5. Stable appearance hepatoduodenal and retroperitoneal lymphadenopathy, likely chronic and related to liver disease.    05/21/2016 Imaging    MR Abdomen W WO Contrast IMPRESSION: 1. Limited motion degraded scan. Cirrhosis. No evidence of a liver mass within these limitations. 2. Mild splenomegaly. No ascites. Stable mild paraumbilical and gastroesophageal varices. 3. Stable nonspecific mild retroperitoneal lymphadenopathy. 4. 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    06/04/2016 Tumor Marker    AFP 3.5     08/25/2016 Imaging    MR Abdomen w wo contrast  IMPRESSION: 1. Mild-to-moderate motion degradation, preferentially involving the pre and postcontrast dynamic images. On follow-up, given the extent of motion on this exam, multiphase CT may be preferred. 2.  Given these limitations, no evidence of hepatocellular carcinoma. 3. Marked cirrhosis and portal venous hypertension. 4. Slight increase in abdominal adenopathy, which is most likely reactive. Recommend  attention on follow-up. 5. Cholelithiasis.     11/02/2016 Procedure    Upper endoscopy and colonoscopy  Diagnosis 1. Stomach, biopsy - MILD CHRONIC GASTRITIS. - NEGATIVE FOR HELICOBACTER PYLORI. - NO INTESTINAL METAPLASIA, DYSPLASIA, OR MALIGNANCY. 2. Colon, polyp(s), opposite ileocecal valve - TUBULAR ADENOMA. - NO HIGH GRADE DYSPLASIA OR MALIGNANCY. 3. Rectum, polyp(s) - HYPERPLASTIC POLYP. - NO DYSPLASIA OR MALIGNANCY.    01/08/2017 Imaging    CT Chest WO Contrast 01/08/17  IMPRESSION: 1. Stable chest CT. 2. Prominent to mildly enlarged mediastinal lymph nodes are stable. Based on stability, these are favored to be reactive. 3. Stable emphysema and right upper lobe nodularity. 4. Mild atherosclerosis.    01/08/2017 Imaging    MRI Abd W WO Contrast 01/08/17  IMPRESSION: 1. Cirrhotic liver now with severe iron deposition throughout the hepatic parenchyma, as well as areas of developing confluent hepatic fibrosis. Today's study clearly demonstrates 2 new hypervascular nodules in segment 4A of the liver, which demonstrate washout but no definite pseudocapsule, the largest which measures 1.5 cm. At this time, these are considered suspicious for hepatocellular carcinoma, bladder classified as Li-RADS 3 lesions. 2. 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 3. Cholelithiasis without evidence of acute cholecystitis at this time. 4. Mild splenomegaly.    03/08/2017 Imaging    MRI Abdomen W WO Contrast 03/08/17 IMPRESSION: 1. 15 mm hypervascular lesion identified on the previous study is stable in the interval measuring 14 mm today. A lesion of this size demonstrating arterial phase hypervascularity and washout is consistent with LI-RADS 4, suggestive of but not diagnostic for hepatoma. Follow-up MRI in 3 months may be warranted. 2. 9 mm hypervascular lesion identified in segment IV on the previous study is not identified today.  Attention on follow-up is recommended. 3. 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 4. Cholelithiasis.    06/01/2017 Imaging    CT CAP w contrast IMPRESSION: 1. Stable size of the dominant arterial phase enhancing lesion in segment 4a of the liver, currently measuring 1.4 by 1.0 cm. There is a questionable second focus measuring 0.7 by 0.4 cm in segment 2 of the liver. Both merit surveillance. 2. A porta hepatis lymph node has enlarged compared to the prior MRI, previously 1.2 cm and currently 2.0 cm. Although potentially reactive, a malignant lymph node could also demonstrate such size increase. This likewise merits careful surveillance. Otherwise is borderline adenopathy in the chest and abdomen. 3. Mild reduced nodularity associated with a right apical bandlike density, this appearance likewise merits surveillance. 4. Other imaging findings of potential clinical significance: Aortic Atherosclerosis (ICD10-I70.0). Emphysema (ICD10-J43.9). Airway thickening is present, suggesting bronchitis or reactive airways disease. Contracted gallbladder with small gallstones. Cavernous transformation of the portal vein with recanalized umbilical vein indicating portal venous hypertension. Cirrhosis. Capsular calcifications along the glenohumeral and hip joints.     HISTORY OF PRESENTING ILLNESS (10/11/2015):  Kevin Dillon 67 y.o. male with past medical history of treated hepatitis C, liver cirrhosis, and hepatocellular carcinoma is here because of recurrent hepatocellular carcinoma. He is accompanied by his significant other [name] and her sister.  He was diagnosed with hepatocellular carcinoma in 2015, his initial image result is not available and staging is unknown. He was seen by interventional radiologist  Dr. Delice Lesch at [facility] in [place] and underwent TACE procedure 3 times in 2015. He was  subsequently followed, and recently repeated CT scan showed a new 5.0 cm mass in segment 8, with portal vein invasion. He was felt not to be a candidate for further liver targeted therapy, and was referred to [place] for further systemic therapy.  He complains about mid epigastric pain for the past 2 years, which significantly improved after his TACE procedure in 2015. It has been getting worse lately in the past 6 months. He states he gets about 7-8 out of 10, persistent, he has been taking oxycodone 20 mg every 6 hours, but his pain is not well controlled. He is quite fatigued, is low appetite. He last 40 lbs in the pat 4 months. He is able to function at home, in trying to remain to be physically active.  His hepatitis C was successfully treated. He has history of hepatitis C, complicated with ascites and encephalopathy, but it has been well controlled with medical management.   CURRENT THERAPY:  Nivolumab 240mg  every 2 weeks, started on 02/04/2016; on 04/30/16 switched to every 3 weeks due to fatigue; change from 240mg  to 480mg  every 4 weeks starting 09/03/2016. Given his poor toleration, reduced back to every 2 weeks at 240mg  starting 01/02/17. Changed to every 3 weeks on 04/09/17, and changed back to every 2 weeks on 06/11/2017, but non-complaint due to his pain management issue   INTERIM HISTORY:   Kevin Dillon is a 67 y.o. male who returns for follow up. He is here with his wife. He feels bad from chemo. He lost weight and his appetite is not improving. He is also complaining of headaches and is having problems sleeping. He is taking 2 Xanax pills a day.  His PCP prescribed 15mg  oxycodone for a month. He will follow up again with his PCP next month.    MEDICAL HISTORY:  Past Medical History:  Diagnosis Date  . Cancer ([place])    liver  . Cholelithiasis   . Chronic hepatitis C ([place])   . Chronic knee pain   . Chronic left shoulder pain   . CVA (cerebral infarction)   . Gout   . Hepatitis C     genotype 1b.  pt has been vaccinated against hep A and B.  . Hypertension   . Osteoarthritis   . Schatzki's ring   . Tubular adenoma of colon 12/2011    SURGICAL HISTORY: Past Surgical History:  Procedure Laterality Date  . AMPUTATION Left 09/17/2012   Procedure: SMALL FINGER EXTENSOR TENDON REPAIR; METACARPAL LEVEL AMPUTATION RING FINGER; PROXIMAL PHALANX LEVEL AMPUTATION LONG FINGER;  Surgeon: Schuyler Amor, MD;  Location: [place];  Service: Orthopedics;  Laterality: Left;  . Arm surgery     right/plate in arm  . BIOPSY  11/02/2016   Procedure: BIOPSY;  Surgeon: Daneil Dolin, MD;  [facility];  Service: Endoscopy;;  gastric   . COLONOSCOPY WITH ESOPHAGOGASTRODUODENOSCOPY (EGD)  12/30/2011   RMR: Noncritical Schatzki's ring;  Hiatal hernia, Tubular ADENOMA removed from splenic flexure, otherwise normal colonoscopy  . COLONOSCOPY WITH PROPOFOL N/A 11/02/2016   Procedure: COLONOSCOPY WITH PROPOFOL;  Surgeon: Daneil Dolin, MD;  [facility];  Service: Endoscopy;  Laterality: N/A;  1045  . ESOPHAGOGASTRODUODENOSCOPY (EGD) WITH PROPOFOL N/A 11/02/2016   Procedure: ESOPHAGOGASTRODUODENOSCOPY (EGD) WITH PROPOFOL;  Surgeon: Daneil Dolin, MD;  [facility];  Service: Endoscopy;  Laterality: N/A;  .  IR FLUORO GUIDE PORT INSERTION RIGHT  10/21/2016  . IR US GUIDE VASC ACCESS RIGHT  10/21/2016  . LEG SURGERY     left  . POLYPECTOMY  11/02/2016   Procedure: POLYPECTOMY;  Surgeon: Daneil Dolin, MD;  [facility];  Service: Endoscopy;;  colon  . WOUND EXPLORATION Left 09/17/2012   Procedure: WOUND EXPLORATION;  Surgeon: Schuyler Amor, MD;  Location: [place];  Service: Orthopedics;  Laterality: Left;    SOCIAL HISTORY: Social History   Socioeconomic History  . Marital status: Significant Other    Spouse name: Not on file  . Number of children: 0  . Years of education: Not on file  . Highest education level: Not on file   Occupational History  . Occupation: disabled  Social Needs  . Financial resource strain: Not on file  . Food insecurity:    Worry: Not on file    Inability: Not on file  . Transportation needs:    Medical: Not on file    Non-medical: Not on file  Tobacco Use  . Smoking status: Former Smoker    Packs/day: 0.50    Years: 40.00    Pack years: 20.00    Types: Cigarettes    Last attempt to quit: 01/19/2012    Years since quitting: 5.6  . Smokeless tobacco: Never Used  . Tobacco comment: Smokes 1/2 pack of cigarettes daily  Substance and Sexual Activity  . Alcohol use: No    Comment: HX 2 beers 3-4 days per week; QUIT DEC 2013  . Drug use: No    Comment: Hx cocaine yrs ago, Last marijuana OCT 2013  . Sexual activity: Not Currently    Partners: Female  Lifestyle  . Physical activity:    Days per week: Not on file    Minutes per session: Not on file  . Stress: Not on file  Relationships  . Social connections:    Talks on phone: Not on file    Gets together: Not on file    Attends religious service: Not on file    Active member of club or organization: Not on file    Attends meetings of clubs or organizations: Not on file    Relationship status: Not on file  . Intimate partner violence:    Fear of current or ex partner: Not on file    Emotionally abused: Not on file    Physically abused: Not on file    Forced sexual activity: Not on file  Other Topics Concern  . Not on file  Social History Narrative   Lives w/ significant other, [name]    FAMILY HISTORY: Family History  Problem Relation Age of Onset  . Cirrhosis Father 54  . Cancer Father        liver cancer   . Lung cancer Mother 28  . Colon cancer Neg Hx     ALLERGIES:  has No Known Allergies.  MEDICATIONS:  Current Outpatient Medications  Medication Sig Dispense Refill  . ALPRAZolam (XANAX) 1 MG tablet Take 1 tablet (1 mg total) by mouth 2 (two) times daily. 60 tablet 0  . hydrALAZINE  (APRESOLINE) 25 MG tablet Take 75 mg by mouth daily.     Marland Kitchen lactulose (CHRONULAC) 10 GM/15ML solution Take 30 g by mouth daily as needed for moderate constipation.     . lidocaine-prilocaine (EMLA) cream Apply 1 application topically as needed. Apply to portacath 1 1/2 hours - 2 hours prior to procedures as  needed. 30 g 1  . megestrol (MEGACE) 40 MG/ML suspension Take 400 mg by mouth daily.     . Multiple Vitamins-Minerals (CENTRUM SILVER PO) Take 1 tablet by mouth daily.     Marland Kitchen oxyCODONE (ROXICODONE) 15 MG immediate release tablet Take 1 tablet (15 mg total) by mouth every 6 (six) hours as needed for pain. 60 tablet 0  . prochlorperazine (COMPAZINE) 10 MG tablet TAKE 1 TABLET BY MOUTH EVERY 8 HOURS AS NEEDED FOR NAUSEA/VOMITING 30 tablet 1  . rivaroxaban (XARELTO) 20 MG TABS tablet TAKE 1 TABLET BY MOUTH DAILY WITH SUPPER. 30 tablet 3  . mirtazapine (REMERON) 7.5 MG tablet Take 1 tablet (7.5 mg total) by mouth at bedtime. 30 tablet 1   Current Facility-Administered Medications  Medication Dose Route Frequency Provider Last Rate Last Dose  . sodium chloride flush (NS) 0.9 % injection 10 mL  10 mL Intravenous PRN Truitt Merle, MD   10 mL at 08/27/17 1035    REVIEW OF SYSTEMS:  Constitutional: Denies fevers, chills or abnormal night sweats  (+) loss of appetite and taste, lack of sleep, weight loss Eyes: Denies blurriness of vision, double vision or watery eyes Ears, nose, mouth, throat, and face: Denies mucositis or sore throat  Respiratory: Denies cough, dyspnea or wheezes Cardiovascular: Denies palpitation, chest discomfort or lower extremity swelling Gastrointestinal:  Denies nausea, heartburn (+) diffuse abdominal pain  Skin: Denies abnormal skin rashes Lymphatics: Denies new lymphadenopathy or easy bruising Neurological:Denies numbness, tingling or new weaknesses Behavioral/Psych: normal Musculoskeletal: (+) back pain, lower extremity myalgia pain  All other systems were reviewed with the  patient and are negative.  PHYSICAL EXAMINATION  ECOG PERFORMANCE STATUS: 2  Vitals:   08/27/17 1010  BP: (!) 164/81  Pulse: (!) 59  Resp: 20  Temp: 97.9 F (36.6 C)  SpO2: 97%   Filed Weights   08/27/17 1010  Weight: 181 lb 3.2 oz (82.2 kg)     GENERAL:alert, no distress and comfortable SKIN: skin color, texture, turgor are normal, no rashes or significant lesions EYES: normal, conjunctiva are pink and non-injected, sclera clear OROPHARYNX:no exudate, no erythema and lips, buccal mucosa, and tongue normal  NECK: supple, thyroid normal size, non-tender, without nodularity LYMPH:  no palpable lymphadenopathy in the cervical, axillary or inguinal LUNGS: clear to auscultation and percussion with normal breathing effort HEART: regular rate & rhythm and no murmurs and no lower extremity edema ABDOMEN:(+) abdominal bloating with mild RUQ tenderness Musculoskeletal:no cyanosis of digits and no clubbing (+) bilateral lower limb pain PSYCH: alert & oriented x 3 with fluent speech NEURO: no focal motor/sensory deficits  LABORATORY DATA:  I have reviewed the data as listed CBC Latest Ref Rng & Units 08/27/2017 07/20/2017 07/06/2017  WBC 4.0 - 10.3 K/uL 8.0 8.0 8.3  Hemoglobin 13.0 - 17.1 g/dL 12.7(L) 11.8(L) 12.6(L)  Hematocrit 38.4 - 49.9 % 36.3(L) 35.1(L) 36.8(L)  Platelets 140 - 400 K/uL 219 170 203   CMP Latest Ref Rng & Units 08/27/2017 07/20/2017 07/06/2017  Glucose 70 - 99 mg/dL 115(H) 108(H) 95  BUN 8 - 23 mg/dL 17 13 11   Creatinine 0.61 - 1.24 mg/dL 1.24 1.21 1.13  Sodium 135 - 145 mmol/L 136 137 140  Potassium 3.5 - 5.1 mmol/L 3.5 3.6 3.9  Chloride 98 - 111 mmol/L 104 107 108  CO2 22 - 32 mmol/L 23 23 23   Calcium 8.9 - 10.3 mg/dL 9.0 9.3 9.7  Total Protein 6.5 - 8.1 g/dL 7.6 7.3 7.8  Total Bilirubin  0.3 - 1.2 mg/dL 0.8 1.0 0.7  Alkaline Phos 38 - 126 U/L 109 156(H) 143  AST 15 - 41 U/L 31 42(H) 30  ALT 0 - 44 U/L 27 30 18     AFP:  09/14/2012: 28.9 10/11/2015:  5.3 11/01/2015: 3.7 12/02/2015: 3.7 01/22/2016: 3.4 02/18/2016: 7.1 03/17/16: 5.8 04/16/16: 4.1 06/04/2016: 3.5 07/16/2016:4.4 08/27/2016: 5.6 10/08/16: 4.3 11/05/16: 4.8 12/03/16: 5.4 01/01/17: 5.1 01/29/17: 4.5 02/26/17: 5.5 04/09/17: 4.0 04/30/17: 3.3 06/11/17: 4.0 07/06/17: 4.4   PATHOLOGY   Diagnosis 11/02/16  1. Stomach, biopsy - MILD CHRONIC GASTRITIS. - NEGATIVE FOR HELICOBACTER PYLORI. - NO INTESTINAL METAPLASIA, DYSPLASIA, OR MALIGNANCY. 2. Colon, polyp(s), opposite ileocecal valve - TUBULAR ADENOMA. - NO HIGH GRADE DYSPLASIA OR MALIGNANCY. 3. Rectum, polyp(s) - HYPERPLASTIC POLYP. - NO DYSPLASIA OR MALIGNANCY. Microscopic Comment 1. A Warthin-Starry stain is performed to determine the possibility of the presence of Helicobacter pylori. The Warthin-Starry stain is negative for organisms of Helicobacter pylori.   PROCEDURE  Upper Endoscopy by Dr. [name] 11/02/16  IMPRESSION:  - mildly obstructing Schatzki ring. - dilated with scope passage. erosive reflux esophagitis - Small hiatal hernia. - Portal hypertensive gastropathy. biopsied - Normal duodenal bulb and second portion of the duodenum. Biopsied.  Colonoscopy by Dr. [name] 11/02/16  IMPRESSION:  - Two 3 to 5 mm polyps in the rectum and at the ileocecal valve, removed with a cold snare. Resected and retrieved. - Diverticulosis in the sigmoid colon and in the descending colon. - The examination was otherwise normal on direct and retroflexion views.   RADIOGRAPHIC STUDIES: I have personally reviewed the radiological images as listed and agreed with the findings in the report.  04/17/16 CT CAP W PELVIS IMPRESSION: 1. Stable mild mediastinal lymphadenopathy. 2. Interval improvement in the bibasilar peribronchial thickening, airway impaction, and peribronchovascular nodularity. Imaging features suggest marked improvement in bilateral low the atypical infection. Changes in the right middle lobe with more stable and  may represent scarring from previous infectious/inflammatory etiology. 3. The small hypervascular lesions seen in the anterior left liver on the previous study are not evident today. There is some subtle, ill-defined hyperenhancement in the central left liver which is nonspecific. Attention to this area on followup imaging recommended. 4. Chronic portal vein inclusion with cavernous transformation in the porta hepatis and paraesophageal varices. 5. Stable appearance hepatoduodenal and retroperitoneal lymphadenopathy, likely chronic and related to liver disease.   MR Abdomen W WO Contrast 05/21/16 IMPRESSION: 1. Limited motion degraded scan. Cirrhosis. No evidence of a liver mass within these limitations. 2. Mild splenomegaly. No ascites. Stable mild paraumbilical and gastroesophageal varices. 3. Stable nonspecific mild retroperitoneal lymphadenopathy. 4. 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  MR Abdomen w wo contrast 08/25/2016 IMPRESSION: 1. Mild-to-moderate motion degradation, preferentially involving the pre and postcontrast dynamic images. On follow-up, given the extent of motion on this exam, multiphase CT may be preferred. 2. Given these limitations, no evidence of hepatocellular carcinoma. 3. Marked cirrhosis and portal venous hypertension. 4. Slight increase in abdominal adenopathy, which is most likely reactive. Recommend attention on follow-up. 5. Cholelithiasis.  CT Chest WO Contrast 01/08/17  IMPRESSION: 1. Stable chest CT. 2. Prominent to mildly enlarged mediastinal lymph nodes are stable. Based on stability, these are favored to be reactive. 3. Stable emphysema and right upper lobe nodularity. 4. Mild atherosclerosis.  MRI Abd W WO Contrast 01/08/17  IMPRESSION: 1. Cirrhotic liver now with severe iron deposition throughout the hepatic parenchyma, as well as areas of  developing confluent hepatic fibrosis.  Today's study clearly demonstrates 2 new hypervascular nodules in segment 4A of the liver, which demonstrate washout but no definite pseudocapsule, the largest which measures 1.5 cm. At this time, these are considered suspicious for hepatocellular carcinoma, bladder classified as Li-RADS 3 lesions. 2. 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 3. Cholelithiasis without evidence of acute cholecystitis at this time. 4. Mild splenomegaly.  MRI Abdomen W WO Contrast 03/08/17 IMPRESSION: 1. 15 mm hypervascular lesion identified on the previous study is stable in the interval measuring 14 mm today. A lesion of this size demonstrating arterial phase hypervascularity and washout is consistent with LI-RADS 4, suggestive of but not diagnostic for hepatoma. Follow-up MRI in 3 months may be warranted. 2. 9 mm hypervascular lesion identified in segment IV on the previous study is not identified today. Attention on follow-up is recommended. 3. 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 4. Cholelithiasis.  ASSESSMENT & PLAN:  NEILS SIRACUSA is a 67 y.o. Caucasian male with past medical history of successfully treated hepatitis C, liver cirrhosis, history of ascites and encephalopathy, recurrent HCC   1. Recurrent HCC, initially stage II, recurrent in 2017  -I previously reviewed his previous image and outside medical records extensively, confirmed key findings: He initially had a multifocal disease, status post TACE 3 times in 2015 -he developed symptomatic local recurrence in 08/2015, with a 5cm new lesion in segment 8 with direct invasion into portal vein, and a portocaval lymph node. He is not a candidate for liver targeted therapy per his IR Dr. Delice Lesch -He was started on first line sorafenib, tolerated poorly. His previous CT abdomen and pelvis on 01/25/2016 revealed extensive  portal hypertension, and a probable cancer progression in the liver. -His treatment was changed to immunotherapy Nivolumab on 02/04/16, he tolerated very well, and clinically he was doing much better, with less pain and better energy.  -I previously discussed his restaging CT scan which was done on 02/14/2016, it actually showed decreased liver cancer size compared to the scan a month ago. No other new metastasis. -restaging CT from 04/17/2016 showed no visible liver mass, but the imaging was very limited due to his liver cirrhosis  -I previously reviewed his recent abdominal MRI from 05/21/2016, which showed no visible mass in the liver. He has had complete radiographic response. -I reviewed his restaging MRI from 08/25/2016, which showed no visible liver mass, but the images quality with significant compromised due to the motion.  -Nivo has previously been changed to 480mg  every 4 weeks in 08/2016. Due to poor toleration (fatigue) I changed his nivo back to 240mg  every 2 weeks starting 01/02/17.  -We discussed his MRI abd and CT chest from 01/08/17 which showed no concern for metastatic disease in the chest. MRI shows two small new lesions in his liver. This was reviewed in tumor board, felt to be indeterminate and close f/u with imaging was recommended -MRI Abdomen from 03/08/17 reveals that his 15 mm hypervascular lesion identified on the previous study is stable in the interval measuring 14 mm, demonstrating arterial phase hypervascularity and washout is consistent with LI-RADS 4, suggestive of but not diagnostic for hepatoma, 9 mm hypervascular lesion identified in segment IV on the previous study is not identified today. I previously discussed results with pt. I recommended continue Nivolumab. -On 04/09/17, he requested to change treatment to every 3 weeks at 240 mg  dose due to the fatigue. He knows the standard is every 2 weeks, but I will accommodated his request. -CT CAP w contrast on 06/01/2017 showed:  Stable size of the dominant arterial phase enhancing lesion in segment 4a of the liver, currently measuring 1.4 by 1.0 cm. There is a questionable second focus measuring 0.7 by 0.4 cm in segment 2 of the liver. Both merit surveillance. A portal hepatis lymph node has enlarged compared to the prior MRI, previously 1.2 cm and currently 2.0 cm. Although potentially reactive, a malignant lymph node could also demonstrate such size increase. This likewise merits careful surveillance. Otherwise is borderline adenopathy in the chest and abdomen. Mild reduced nodularity associated with a right apical bandlike density, this appearance likewise merits surveillance. -Due to the probably slow disease progression, I recommended pt to have nivolumab q 2 weeks instead of  q 3 weeks on 06/11/17. He agreed.  -We reviewed his case in our GI tumor board recently, the consensus is continue to Nivolumab, given no significant disease progression on image. -Since he presented to the ED on 06/18/17 due to reaction to Suboxone he skipped a few Nivo treatment, he did restart on 07/06/17, but noncompliant, due to to " not feeling well" since we cut his narcotics. -He is clinically stable, will continue to Nivolumab every 2 weeks.  He wants to change to every 3 weeks again, I encouraged him to stay on every 2 weeks for now. -I recommended he sees pain specialist Dr. Lanetta Inch, but he rejects and prefers to see PCP Dr Sheryle Hail. -He became sick with fatigue and low appetite after last treatment, and it took longer to recover.  Will restart treatment next week  -plan to repeat restaging scan before next visit    2. Abdominal pain, low back pain -His abdominal pain previously improved since he started treatment. With oxycodone and OxyContin, he was on very high dose and overall pain was controlled  -I previously prescribed flexeril in attempt to help with his pain. Due to his high tolerance, he can take up to 2 a day. -His prior constipation  is likely related to his narcotic use, he uses colace and I advised him he can use Miralax or Senokot that is stronger.  -I previously had a lengthy conversation with pt about reducing his narcotic pain medication and the risks associated with taking high dose (including shortening his life) for a long period of time. His cancer is well controlled currently and it should not cause him a lot of pain. I am concerned that he has narcotic addition, and his depression/anxiety may also contribute to his pain. He is reluctant to reducing his dose. I offered Cymbalta on 06/11/17 and he agreed to try. Our social worker Webb Silversmith will see him same day and I previously recommended that he see psychiatrist someone to help manage his depression/anxiety/addiction.  -He was recently prescribed Suboxone on 06/17/17 by Dr. Rachael Fee. This reacted to Suboxone and had to go to ED the next day. He last took Suboxone on 06/21/17 7:00am. I explained Suboxone removed prior high tolerance to pain medication. -I discussed his pain management will likely be long term at this point. I discussed his options in great detail with him and his wife. He can return to Dr. Rachael Fee who plans to change his Suboxone to Subotex to prevent another reaction. Pt declined firmly   His other option is for me to send a referral to another pain specialist. He declined at this point  -  I agree to write his pain meds, but will start him on very low-dose, I started him on oxycodone 5 mg every 6 hours as needed. He states that it's not helping his pain, but he also does not show any signs of withdraw -I filled his oxycodone 10mg , Q6H PRN,  #30 today I discussed the option of trying Cymbalta again as this is not addictive. He notes this made him nauseous before but is willing to try.  -I discussed we need to find his root cased of pain as this is not solely cancer related pain. I explained I would like him to get treatment for his depression, and probable  addiction, to help improve his overall feeling. He voiced good understanding, but he declined pain clinic referral -I prescribed oxycodone 15 mg every 6 hours as needed, I do not plan to increase the dose unless he has disease progression or injury  -Patient states he will ask his primary care physician Dr. Sheryle Hail to prescribe his pain meds, I did call Dr. Sheryle Hail and updated him about his recent pain management issue. Dr. Sheryle Hail has prescribed him oxycodone last month, and pt will ask Dr. Sheryle Hail to manage his pain issue and I will not refill any of his narcotics in the future   -Xanax and Remeron refilled today   3. Portal vein thrombosis -continue Xarelto  -We previously discussed the moderate to high risk of bleeding with Xarelto due to his liver cirrhosis and he knows to avoid injury and fall  -if he develops anemia or GI bleeding again, will stop Xarelto  4 Liver cirrhosis secondary to hepatitis C and alcohol, history of encephalopathy and ascites -He will continue follow-up with [facility], his ascites has been well controlled with diuretics, but seems getting worse lately, I previously encourage him to follow up with [facility]  -He knows to use laxative, especially lactulose for his constipation -Given Lasix by [facility] to help his ascites. Since improving I suggest he reduce down to half tablet or once every other day.  -he will continue to follow up with [facility]  -He reports that he is not taking lasix as prescribed at this time.   5. Hep C, successfully treated -Continue follow-up with liver clinic NP [facility]  6. Anxiety and insomnia  -He has Xanax for anxiety.  -continue Ativan 1 mg at bedtime as needed for insomnia, he knows not to take with Xanax together, he knows not to take during the day. He does not feel Ativan is working well for sleep -I refilled his Xanax on 06/21/17 and advised him to take it once daily for now.  -I refilled his Xanax to 1mg  BID today    7. Fatigue and weight gain/loss -He  reported more fatigue and weakness since his narcotics dose has been significantly decreased -His TSH was previously elevated but T3 and T4 levels were normal. We discussed the side effect of hypothyroidism from Nivolumab, I previously referred him to Dr. Loanne Drilling for evaluation to see if he needs low-dose Synthroid but he did not go. Will continue monitoring  -I did not suspect his weight gain is related to fluid retention. I previously suggested he hold Megace and reduce ensure boosts to once daily. He should use a balanced diet and exercise to help.  -Given recent reaction to Suboxone he has not been eating much and loss 10 pounds in a little over a week.  -He has started gaining some weight back. Will continue to monitor.  -He is losing  weight. I discussed dietician referral and he declined.   8. HTN -He is on Hydralazine 50 mg daily, and follow-up of his primary care physician  9. Smoking  - Patient has started back smoking due to the fear of not getting better - I previously recommended a smoking cessation program. He refused.  10. Depression and anxiety  - The patient previously reports feelings of depression related to his diagnosis. -I am worried that he feels hopeless, he was seen by our social worker Webb Silversmith before who has referred him to a psychiatrist [facility] -He notes being depressed due to his limited abilities. He denies thoughts of suicide or self harm.  -I sent referral to behavioral health (06/29/17), and he has an appointment in August but he does not plan to go.  11. Iron deficient anemia  -10/16/16 lab results was consistent with iron deficiency, possible related to occluded GI bleeding -He is on Xarelto, no clinical overt bleeding. -He received IV feraheme 10/16/16 and in 10/2016  -He had GI work up with upper endoscopy and colonoscopy per Dr. [name] on 11/02/16. Biopsy of the stomach showed mild chronic gastritis, negative for H. Pylori or malignancy. Colon and  rectal polyps were negative for dysplasia or malignancy. He will f/u with GI again in 01/2017.  -if he develops anemia or GI bleeding again, will stop Xarelto   12. Goal of care discussion, DNR/DNI  -We previously discussed the incurable nature of his cancer, and the overall poor prognosis, especially if he does not have good response to chemotherapy or progress on chemo -The patient understands the goal of care is palliative. -he agrees with DNR/DNI    Plan -Xanax and Remeron refilled today -Lab reviewed, adequate for treatment, will proceed Nivolumab next week and continue in 4 weeks -I will see him back in 4 weeks with MRI abdomen and CT chest before  -His primary care physician Dr. Sheryle Hail will manage his pain medication, I do not plan to refill his narcotics.   All questions were answered. The patient knows to call the clinic with any problems, questions or concerns.  I spent 20 minutes counseling the patient face to face. Pt's partner had many questions, and I answered to her satisfaction.  The total time spent in the appointment was 25  minutes and more than 50% was on counseling.  Dierdre Searles Dweik am acting as scribe for Dr. Truitt Merle.  I have reviewed the above documentation for accuracy and completeness, and I agree with the above.    Truitt Merle  08/27/2017 1:40 PM

## 2017-08-27 ENCOUNTER — Encounter: Payer: Self-pay | Admitting: Hematology

## 2017-08-27 ENCOUNTER — Inpatient Hospital Stay: Payer: Medicare Other | Attending: Hematology

## 2017-08-27 ENCOUNTER — Inpatient Hospital Stay (HOSPITAL_BASED_OUTPATIENT_CLINIC_OR_DEPARTMENT_OTHER): Payer: Medicare Other | Admitting: Hematology

## 2017-08-27 ENCOUNTER — Inpatient Hospital Stay: Payer: Medicare Other

## 2017-08-27 VITALS — BP 164/81 | HR 59 | Temp 97.9°F | Resp 20 | Ht 73.0 in | Wt 181.2 lb

## 2017-08-27 DIAGNOSIS — K5900 Constipation, unspecified: Secondary | ICD-10-CM

## 2017-08-27 DIAGNOSIS — R109 Unspecified abdominal pain: Secondary | ICD-10-CM | POA: Insufficient documentation

## 2017-08-27 DIAGNOSIS — R188 Other ascites: Secondary | ICD-10-CM | POA: Diagnosis not present

## 2017-08-27 DIAGNOSIS — M545 Low back pain: Secondary | ICD-10-CM | POA: Insufficient documentation

## 2017-08-27 DIAGNOSIS — F419 Anxiety disorder, unspecified: Secondary | ICD-10-CM

## 2017-08-27 DIAGNOSIS — K7469 Other cirrhosis of liver: Secondary | ICD-10-CM

## 2017-08-27 DIAGNOSIS — G4700 Insomnia, unspecified: Secondary | ICD-10-CM | POA: Diagnosis not present

## 2017-08-27 DIAGNOSIS — I10 Essential (primary) hypertension: Secondary | ICD-10-CM | POA: Diagnosis not present

## 2017-08-27 DIAGNOSIS — Z862 Personal history of diseases of the blood and blood-forming organs and certain disorders involving the immune mechanism: Secondary | ICD-10-CM | POA: Insufficient documentation

## 2017-08-27 DIAGNOSIS — C220 Liver cell carcinoma: Secondary | ICD-10-CM

## 2017-08-27 DIAGNOSIS — Z8619 Personal history of other infectious and parasitic diseases: Secondary | ICD-10-CM | POA: Insufficient documentation

## 2017-08-27 DIAGNOSIS — F329 Major depressive disorder, single episode, unspecified: Secondary | ICD-10-CM | POA: Diagnosis not present

## 2017-08-27 DIAGNOSIS — R945 Abnormal results of liver function studies: Secondary | ICD-10-CM

## 2017-08-27 DIAGNOSIS — F17210 Nicotine dependence, cigarettes, uncomplicated: Secondary | ICD-10-CM

## 2017-08-27 DIAGNOSIS — I81 Portal vein thrombosis: Secondary | ICD-10-CM | POA: Diagnosis not present

## 2017-08-27 DIAGNOSIS — Z5112 Encounter for antineoplastic immunotherapy: Secondary | ICD-10-CM | POA: Diagnosis not present

## 2017-08-27 DIAGNOSIS — Z79899 Other long term (current) drug therapy: Secondary | ICD-10-CM | POA: Insufficient documentation

## 2017-08-27 DIAGNOSIS — K7460 Unspecified cirrhosis of liver: Secondary | ICD-10-CM | POA: Diagnosis not present

## 2017-08-27 DIAGNOSIS — Z7901 Long term (current) use of anticoagulants: Secondary | ICD-10-CM | POA: Insufficient documentation

## 2017-08-27 DIAGNOSIS — F418 Other specified anxiety disorders: Secondary | ICD-10-CM | POA: Insufficient documentation

## 2017-08-27 DIAGNOSIS — G893 Neoplasm related pain (acute) (chronic): Secondary | ICD-10-CM

## 2017-08-27 LAB — CMP (CANCER CENTER ONLY)
ALT: 27 U/L (ref 0–44)
AST: 31 U/L (ref 15–41)
Albumin: 4.2 g/dL (ref 3.5–5.0)
Alkaline Phosphatase: 109 U/L (ref 38–126)
Anion gap: 9 (ref 5–15)
BUN: 17 mg/dL (ref 8–23)
CHLORIDE: 104 mmol/L (ref 98–111)
CO2: 23 mmol/L (ref 22–32)
CREATININE: 1.24 mg/dL (ref 0.61–1.24)
Calcium: 9 mg/dL (ref 8.9–10.3)
GFR, EST NON AFRICAN AMERICAN: 59 mL/min — AB (ref >60)
Glucose, Bld: 115 mg/dL — ABNORMAL HIGH (ref 70–99)
Potassium: 3.5 mmol/L (ref 3.5–5.1)
Sodium: 136 mmol/L (ref 135–145)
Total Bilirubin: 0.8 mg/dL (ref 0.3–1.2)
Total Protein: 7.6 g/dL (ref 6.5–8.1)

## 2017-08-27 LAB — CBC WITH DIFFERENTIAL (CANCER CENTER ONLY)
BASOS ABS: 0.1 10*3/uL (ref 0.0–0.1)
Basophils Relative: 1 %
EOS ABS: 0.2 10*3/uL (ref 0.0–0.5)
Eosinophils Relative: 3 %
HCT: 36.3 % — ABNORMAL LOW (ref 38.4–49.9)
HEMOGLOBIN: 12.7 g/dL — AB (ref 13.0–17.1)
Lymphocytes Relative: 27 %
Lymphs Abs: 2.1 10*3/uL (ref 0.9–3.3)
MCH: 30 pg (ref 27.2–33.4)
MCHC: 35 g/dL (ref 32.0–36.0)
MCV: 85.6 fL (ref 79.3–98.0)
Monocytes Absolute: 0.6 10*3/uL (ref 0.1–0.9)
Monocytes Relative: 8 %
NEUTROS PCT: 61 %
Neutro Abs: 5 10*3/uL (ref 1.5–6.5)
Platelet Count: 219 10*3/uL (ref 140–400)
RBC: 4.24 MIL/uL (ref 4.20–5.82)
RDW: 13.8 % (ref 11.0–14.6)
WBC Count: 8 10*3/uL (ref 4.0–10.3)

## 2017-08-27 LAB — TSH: TSH: 1.809 u[IU]/mL (ref 0.320–4.118)

## 2017-08-27 MED ORDER — HEPARIN SOD (PORK) LOCK FLUSH 100 UNIT/ML IV SOLN
500.0000 [IU] | Freq: Once | INTRAVENOUS | Status: AC
  Administered 2017-08-27: 500 [IU] via INTRAVENOUS
  Filled 2017-08-27: qty 5

## 2017-08-27 MED ORDER — SODIUM CHLORIDE 0.9% FLUSH
10.0000 mL | INTRAVENOUS | Status: DC | PRN
  Administered 2017-08-27: 10 mL via INTRAVENOUS
  Filled 2017-08-27: qty 10

## 2017-08-27 MED ORDER — ALPRAZOLAM 1 MG PO TABS
1.0000 mg | ORAL_TABLET | Freq: Two times a day (BID) | ORAL | 0 refills | Status: DC
Start: 2017-08-27 — End: 2017-09-24

## 2017-08-27 MED ORDER — MIRTAZAPINE 7.5 MG PO TABS: 7.5000 mg | ORAL_TABLET | Freq: Every day | ORAL | 1 refills | Status: AC

## 2017-08-27 MED FILL — MIRTAZAPINE 7.5 MG TABLET: 7.5 | 30 days supply | Qty: 30 | Fill #0

## 2017-08-27 MED FILL — ALPRAZolam 1 MG TABS: 1 | 30 days supply | Qty: 60 | Fill #0

## 2017-08-28 LAB — AFP TUMOR MARKER: AFP, SERUM, TUMOR MARKER: 3.8 ng/mL (ref 0.0–8.3)

## 2017-08-30 ENCOUNTER — Telehealth: Payer: Self-pay | Admitting: Hematology

## 2017-08-30 NOTE — Telephone Encounter (Signed)
Called patient regarding 8/16 and 9/6 per 8/9 los

## 2017-08-31 ENCOUNTER — Ambulatory Visit (HOSPITAL_COMMUNITY): Payer: Medicare Other | Admitting: Psychiatry

## 2017-09-03 ENCOUNTER — Inpatient Hospital Stay: Payer: Medicare Other

## 2017-09-03 VITALS — BP 151/72 | HR 69 | Temp 97.8°F | Resp 18

## 2017-09-03 DIAGNOSIS — C220 Liver cell carcinoma: Secondary | ICD-10-CM

## 2017-09-03 DIAGNOSIS — F418 Other specified anxiety disorders: Secondary | ICD-10-CM | POA: Diagnosis not present

## 2017-09-03 DIAGNOSIS — Z5112 Encounter for antineoplastic immunotherapy: Secondary | ICD-10-CM | POA: Diagnosis not present

## 2017-09-03 DIAGNOSIS — I81 Portal vein thrombosis: Secondary | ICD-10-CM | POA: Diagnosis not present

## 2017-09-03 DIAGNOSIS — R109 Unspecified abdominal pain: Secondary | ICD-10-CM | POA: Diagnosis not present

## 2017-09-03 DIAGNOSIS — D500 Iron deficiency anemia secondary to blood loss (chronic): Secondary | ICD-10-CM

## 2017-09-03 DIAGNOSIS — M545 Low back pain: Secondary | ICD-10-CM | POA: Diagnosis not present

## 2017-09-03 LAB — CBC WITH DIFFERENTIAL (CANCER CENTER ONLY)
BASOS ABS: 0 10*3/uL (ref 0.0–0.1)
BASOS PCT: 0 %
EOS ABS: 0.2 10*3/uL (ref 0.0–0.5)
EOS PCT: 2 %
HCT: 31.5 % — ABNORMAL LOW (ref 38.4–49.9)
Hemoglobin: 10.5 g/dL — ABNORMAL LOW (ref 13.0–17.1)
Lymphocytes Relative: 32 %
Lymphs Abs: 2.9 10*3/uL (ref 0.9–3.3)
MCH: 29.7 pg (ref 27.2–33.4)
MCHC: 33.3 g/dL (ref 32.0–36.0)
MCV: 89 fL (ref 79.3–98.0)
MONO ABS: 0.6 10*3/uL (ref 0.1–0.9)
MONOS PCT: 7 %
Neutro Abs: 5.2 10*3/uL (ref 1.5–6.5)
Neutrophils Relative %: 59 %
PLATELETS: 153 10*3/uL (ref 140–400)
RBC: 3.54 MIL/uL — ABNORMAL LOW (ref 4.20–5.82)
RDW: 14.6 % (ref 11.0–14.6)
WBC Count: 8.8 10*3/uL (ref 4.0–10.3)

## 2017-09-03 LAB — CMP (CANCER CENTER ONLY)
ALBUMIN: 3.5 g/dL (ref 3.5–5.0)
ALK PHOS: 90 U/L (ref 38–126)
ALT: 14 U/L (ref 0–44)
ANION GAP: 8 (ref 5–15)
AST: 22 U/L (ref 15–41)
BILIRUBIN TOTAL: 0.5 mg/dL (ref 0.3–1.2)
BUN: 15 mg/dL (ref 8–23)
CALCIUM: 8.5 mg/dL — AB (ref 8.9–10.3)
CO2: 25 mmol/L (ref 22–32)
CREATININE: 1.42 mg/dL — AB (ref 0.61–1.24)
Chloride: 108 mmol/L (ref 98–111)
GFR, Est AFR Am: 58 mL/min — ABNORMAL LOW (ref >60)
GFR, Estimated: 50 mL/min — ABNORMAL LOW (ref >60)
GLUCOSE: 151 mg/dL — AB (ref 70–99)
Potassium: 3.3 mmol/L — ABNORMAL LOW (ref 3.5–5.1)
SODIUM: 141 mmol/L (ref 135–145)
TOTAL PROTEIN: 6.6 g/dL (ref 6.5–8.1)

## 2017-09-03 MED ORDER — SODIUM CHLORIDE 0.9% FLUSH
10.0000 mL | Freq: Once | INTRAVENOUS | Status: AC
  Administered 2017-09-03: 10 mL
  Filled 2017-09-03: qty 10

## 2017-09-03 MED ORDER — HEPARIN SOD (PORK) LOCK FLUSH 100 UNIT/ML IV SOLN
500.0000 [IU] | Freq: Once | INTRAVENOUS | Status: AC | PRN
  Administered 2017-09-03: 500 [IU]
  Filled 2017-09-03: qty 5

## 2017-09-03 MED ORDER — SODIUM CHLORIDE 0.9 % IV SOLN
240.0000 mg | Freq: Once | INTRAVENOUS | Status: AC
  Administered 2017-09-03: 240 mg via INTRAVENOUS
  Filled 2017-09-03: qty 24

## 2017-09-03 MED ORDER — SODIUM CHLORIDE 0.9% FLUSH
10.0000 mL | INTRAVENOUS | Status: DC | PRN
  Administered 2017-09-03: 10 mL
  Filled 2017-09-03: qty 10

## 2017-09-03 MED ORDER — SODIUM CHLORIDE 0.9 % IV SOLN
Freq: Once | INTRAVENOUS | Status: AC
  Administered 2017-09-03: 11:00:00 via INTRAVENOUS
  Filled 2017-09-03: qty 250

## 2017-09-03 NOTE — Patient Instructions (Signed)
[facility] Discharge Instructions for Patients Receiving Chemotherapy  Today you received the following chemotherapy agents Opdivo  To help prevent nausea and vomiting after your treatment, we encourage you to take your nausea medication as directed   If you develop nausea and vomiting that is not controlled by your nausea medication, call the clinic.   BELOW ARE SYMPTOMS THAT SHOULD BE REPORTED IMMEDIATELY:  *FEVER GREATER THAN 100.5 F  *CHILLS WITH OR WITHOUT FEVER  NAUSEA AND VOMITING THAT IS NOT CONTROLLED WITH YOUR NAUSEA MEDICATION  *UNUSUAL SHORTNESS OF BREATH  *UNUSUAL BRUISING OR BLEEDING  TENDERNESS IN MOUTH AND THROAT WITH OR WITHOUT PRESENCE OF ULCERS  *URINARY PROBLEMS  *BOWEL PROBLEMS  UNUSUAL RASH Items with * indicate a potential emergency and should be followed up as soon as possible.  Feel free to call the clinic should you have any questions or concerns. The clinic phone number is [phone].  Please show the CHEMO ALERT CARD at check-in to the Emergency Department and triage nurse.   

## 2017-09-04 ENCOUNTER — Other Ambulatory Visit: Payer: Self-pay | Admitting: Hematology

## 2017-09-04 DIAGNOSIS — D500 Iron deficiency anemia secondary to blood loss (chronic): Secondary | ICD-10-CM

## 2017-09-06 ENCOUNTER — Other Ambulatory Visit: Payer: Self-pay | Admitting: *Deleted

## 2017-09-06 ENCOUNTER — Telehealth: Payer: Self-pay | Admitting: *Deleted

## 2017-09-06 DIAGNOSIS — E876 Hypokalemia: Secondary | ICD-10-CM

## 2017-09-06 MED ORDER — POTASSIUM CHLORIDE CRYS ER 20 MEQ PO TBCR: 20.0000 meq | EXTENDED_RELEASE_TABLET | Freq: Every day | ORAL | 1 refills | Status: AC

## 2017-09-06 MED FILL — POTASSIUM CL ER 20 MEQ TABL: 20 | 30 days supply | Qty: 30 | Fill #0

## 2017-09-06 NOTE — Telephone Encounter (Signed)
Error

## 2017-09-06 NOTE — Telephone Encounter (Signed)
Spoke with pt and informed pt of low potassium level.  Instructed pt to take Kdur 20 meq daily x 7 days as per Dr. Ernestina Penna instructions.   Asked if pt noticed any blood in stools or any bleeding , pt stated NO.  Stated he was feeling ok.  Instructed pt to take OTC iron as per Dr. Burr Medico.  Reinforced need to increase po fluids intake due to increase Crea level.  Pt voiced understanding. Schedule message sent for repeated lab appt.

## 2017-09-07 ENCOUNTER — Telehealth: Payer: Self-pay | Admitting: Hematology

## 2017-09-07 ENCOUNTER — Telehealth: Payer: Self-pay

## 2017-09-07 NOTE — Telephone Encounter (Signed)
Referral sent to Dr. Maryjean Ka at [facility] for pain management.  Faxed demographic sheet and last OV note today.   Explained to patient's significant other information has been sent, they will be contacting them with an appointment.

## 2017-09-07 NOTE — Telephone Encounter (Signed)
Spoke to patient regarding upcoming aug appts per 8/19 sch message  °

## 2017-09-13 MED FILL — XARELTO 20 MG TABLET: 20 | 30 days supply | Qty: 30 | Fill #1

## 2017-09-13 MED FILL — PROCHLORPERAZINE 10 MG TAB: 10 | 10 days supply | Qty: 30 | Fill #1

## 2017-09-14 ENCOUNTER — Telehealth: Payer: Self-pay

## 2017-09-14 NOTE — Telephone Encounter (Signed)
Left message for Raquel Sarna at Dr. Maryjean Ka office inquiring about when this patient can get in for pain management. Referral was sent over last week.  Requested return phone call.

## 2017-09-14 NOTE — Telephone Encounter (Signed)
Spoke with patient informed him of appointment with Dr. Maryjean Ka at [facility] on 10/27/17 at 9:45 to arrive 30 minutes early, he will be receiving a packet of information, also they would like records from Dr. Theressa Stamps office.  He has an upcoming appointment with him this Friday and will ask for them.  Explained his pain medication will have to be managed by Dr. Volney American in the interim until he can see Dr. Maryjean Ka.   Patient verbalized an understanding and will see Dr. Burr Medico on 9/6 as scheduled.

## 2017-09-15 ENCOUNTER — Other Ambulatory Visit: Payer: Medicare Other

## 2017-09-17 ENCOUNTER — Telehealth: Payer: Self-pay

## 2017-09-17 DIAGNOSIS — C220 Liver cell carcinoma: Secondary | ICD-10-CM | POA: Diagnosis not present

## 2017-09-17 DIAGNOSIS — I10 Essential (primary) hypertension: Secondary | ICD-10-CM | POA: Diagnosis not present

## 2017-09-17 DIAGNOSIS — Z79899 Other long term (current) drug therapy: Secondary | ICD-10-CM | POA: Diagnosis not present

## 2017-09-17 DIAGNOSIS — M542 Cervicalgia: Secondary | ICD-10-CM | POA: Diagnosis not present

## 2017-09-17 NOTE — Telephone Encounter (Signed)
Patient called left [place] a message for a return phone call, called patient back left voice message requesting he state what the request is so I can better serve him on my voice mail.

## 2017-09-21 ENCOUNTER — Other Ambulatory Visit: Payer: Self-pay | Admitting: Hematology

## 2017-09-21 ENCOUNTER — Ambulatory Visit (HOSPITAL_COMMUNITY)
Admission: RE | Admit: 2017-09-21 | Discharge: 2017-09-21 | Disposition: A | Discharge status: Home / Self Care | Payer: Medicare Other | Source: Ambulatory Visit | Attending: Hematology | Admitting: Hematology

## 2017-09-21 ENCOUNTER — Inpatient Hospital Stay: Payer: Medicare Other | Attending: Hematology

## 2017-09-21 DIAGNOSIS — C220 Liver cell carcinoma: Secondary | ICD-10-CM

## 2017-09-21 DIAGNOSIS — K7460 Unspecified cirrhosis of liver: Secondary | ICD-10-CM | POA: Insufficient documentation

## 2017-09-21 DIAGNOSIS — D509 Iron deficiency anemia, unspecified: Secondary | ICD-10-CM | POA: Insufficient documentation

## 2017-09-21 DIAGNOSIS — K766 Portal hypertension: Secondary | ICD-10-CM | POA: Insufficient documentation

## 2017-09-21 DIAGNOSIS — G4700 Insomnia, unspecified: Secondary | ICD-10-CM | POA: Diagnosis not present

## 2017-09-21 DIAGNOSIS — R590 Localized enlarged lymph nodes: Secondary | ICD-10-CM | POA: Insufficient documentation

## 2017-09-21 DIAGNOSIS — I10 Essential (primary) hypertension: Secondary | ICD-10-CM | POA: Insufficient documentation

## 2017-09-21 DIAGNOSIS — K7031 Alcoholic cirrhosis of liver with ascites: Secondary | ICD-10-CM | POA: Diagnosis not present

## 2017-09-21 DIAGNOSIS — I81 Portal vein thrombosis: Secondary | ICD-10-CM | POA: Diagnosis not present

## 2017-09-21 DIAGNOSIS — R918 Other nonspecific abnormal finding of lung field: Secondary | ICD-10-CM | POA: Diagnosis not present

## 2017-09-21 DIAGNOSIS — B182 Chronic viral hepatitis C: Secondary | ICD-10-CM | POA: Insufficient documentation

## 2017-09-21 DIAGNOSIS — Z5112 Encounter for antineoplastic immunotherapy: Secondary | ICD-10-CM | POA: Insufficient documentation

## 2017-09-21 DIAGNOSIS — G893 Neoplasm related pain (acute) (chronic): Secondary | ICD-10-CM | POA: Diagnosis not present

## 2017-09-21 DIAGNOSIS — E46 Unspecified protein-calorie malnutrition: Secondary | ICD-10-CM | POA: Diagnosis not present

## 2017-09-21 DIAGNOSIS — J438 Other emphysema: Secondary | ICD-10-CM | POA: Diagnosis not present

## 2017-09-21 DIAGNOSIS — K8020 Calculus of gallbladder without cholecystitis without obstruction: Secondary | ICD-10-CM | POA: Insufficient documentation

## 2017-09-21 DIAGNOSIS — Z5111 Encounter for antineoplastic chemotherapy: Secondary | ICD-10-CM | POA: Diagnosis not present

## 2017-09-21 DIAGNOSIS — R530 Neoplastic (malignant) related fatigue: Secondary | ICD-10-CM | POA: Diagnosis not present

## 2017-09-21 DIAGNOSIS — Z801 Family history of malignant neoplasm of trachea, bronchus and lung: Secondary | ICD-10-CM | POA: Insufficient documentation

## 2017-09-21 DIAGNOSIS — Z800 Family history of malignant neoplasm of digestive organs: Secondary | ICD-10-CM | POA: Diagnosis not present

## 2017-09-21 DIAGNOSIS — Z87891 Personal history of nicotine dependence: Secondary | ICD-10-CM | POA: Insufficient documentation

## 2017-09-21 DIAGNOSIS — I700 Atherosclerosis of aorta: Secondary | ICD-10-CM | POA: Diagnosis not present

## 2017-09-21 DIAGNOSIS — F419 Anxiety disorder, unspecified: Secondary | ICD-10-CM | POA: Insufficient documentation

## 2017-09-21 DIAGNOSIS — D500 Iron deficiency anemia secondary to blood loss (chronic): Secondary | ICD-10-CM

## 2017-09-21 DIAGNOSIS — Z7901 Long term (current) use of anticoagulants: Secondary | ICD-10-CM | POA: Insufficient documentation

## 2017-09-21 DIAGNOSIS — Z9221 Personal history of antineoplastic chemotherapy: Secondary | ICD-10-CM | POA: Insufficient documentation

## 2017-09-21 DIAGNOSIS — K5900 Constipation, unspecified: Secondary | ICD-10-CM | POA: Diagnosis not present

## 2017-09-21 DIAGNOSIS — J432 Centrilobular emphysema: Secondary | ICD-10-CM | POA: Insufficient documentation

## 2017-09-21 DIAGNOSIS — F329 Major depressive disorder, single episode, unspecified: Secondary | ICD-10-CM | POA: Diagnosis not present

## 2017-09-21 DIAGNOSIS — Z79899 Other long term (current) drug therapy: Secondary | ICD-10-CM | POA: Diagnosis not present

## 2017-09-21 LAB — CMP (CANCER CENTER ONLY)
ALK PHOS: 96 U/L (ref 38–126)
ALT: 14 U/L (ref 0–44)
ANION GAP: 11 (ref 5–15)
AST: 23 U/L (ref 15–41)
Albumin: 4 g/dL (ref 3.5–5.0)
BILIRUBIN TOTAL: 0.5 mg/dL (ref 0.3–1.2)
BUN: 15 mg/dL (ref 8–23)
CALCIUM: 9.4 mg/dL (ref 8.9–10.3)
CO2: 18 mmol/L — ABNORMAL LOW (ref 22–32)
Chloride: 109 mmol/L (ref 98–111)
Creatinine: 1.36 mg/dL — ABNORMAL HIGH (ref 0.61–1.24)
GFR, EST NON AFRICAN AMERICAN: 53 mL/min — AB (ref >60)
GFR, Est AFR Am: >60 mL/min (ref >60)
Glucose, Bld: 86 mg/dL (ref 70–99)
POTASSIUM: 3.9 mmol/L (ref 3.5–5.1)
Sodium: 138 mmol/L (ref 135–145)
TOTAL PROTEIN: 7.8 g/dL (ref 6.5–8.1)

## 2017-09-21 LAB — IRON AND TIBC
IRON: 35 ug/dL — AB (ref 42–163)
SATURATION RATIOS: 8 % — AB (ref 42–163)
TIBC: 440 ug/dL — AB (ref 202–409)
UIBC: 405 ug/dL

## 2017-09-21 LAB — CBC WITH DIFFERENTIAL (CANCER CENTER ONLY)
BASOS ABS: 0 10*3/uL (ref 0.0–0.1)
BASOS PCT: 1 %
EOS PCT: 2 %
Eosinophils Absolute: 0.2 10*3/uL (ref 0.0–0.5)
HEMATOCRIT: 33.3 % — AB (ref 38.4–49.9)
Hemoglobin: 11.4 g/dL — ABNORMAL LOW (ref 13.0–17.1)
LYMPHS PCT: 26 %
Lymphs Abs: 2.1 10*3/uL (ref 0.9–3.3)
MCH: 30.4 pg (ref 27.2–33.4)
MCHC: 34.2 g/dL (ref 32.0–36.0)
MCV: 88.7 fL (ref 79.3–98.0)
Monocytes Absolute: 0.8 10*3/uL (ref 0.1–0.9)
Monocytes Relative: 10 %
NEUTROS ABS: 5.1 10*3/uL (ref 1.5–6.5)
Neutrophils Relative %: 61 %
PLATELETS: 192 10*3/uL (ref 140–400)
RBC: 3.75 MIL/uL — AB (ref 4.20–5.82)
RDW: 15.4 % — AB (ref 11.0–14.6)
WBC: 8.2 10*3/uL (ref 4.0–10.3)

## 2017-09-21 LAB — TSH: TSH: 2.894 u[IU]/mL (ref 0.320–4.118)

## 2017-09-21 LAB — FERRITIN: FERRITIN: 86 ng/mL (ref 24–336)

## 2017-09-21 MED ORDER — HEPARIN SOD (PORK) LOCK FLUSH 100 UNIT/ML IV SOLN
INTRAVENOUS | Status: AC
  Filled 2017-09-21: qty 5

## 2017-09-21 MED ORDER — GADOBENATE DIMEGLUMINE 529 MG/ML IV SOLN
20.0000 mL | Freq: Once | INTRAVENOUS | Status: AC | PRN
  Administered 2017-09-21: 18 mL via INTRAVENOUS

## 2017-09-21 MED ORDER — IOHEXOL 300 MG/ML  SOLN
75.0000 mL | Freq: Once | INTRAMUSCULAR | Status: AC | PRN
  Administered 2017-09-21: 75 mL via INTRAVENOUS

## 2017-09-21 MED ORDER — HEPARIN SOD (PORK) LOCK FLUSH 100 UNIT/ML IV SOLN: 500.0000 [IU] | Freq: Once | INTRAVENOUS | Status: DC

## 2017-09-22 LAB — AFP TUMOR MARKER: AFP, Serum, Tumor Marker: 4.8 ng/mL (ref 0.0–8.3)

## 2017-09-22 NOTE — Progress Notes (Signed)
[place]  [phone] [phone]  Clinic Follow up Note   Patient Care Team: Antonietta Jewel, MD as PCP - General (Internal Medicine) [name], Cristopher Estimable, MD as Attending Physician (Gastroenterology) Antonietta Jewel, MD as Referring Physician (Internal Medicine) Truitt Merle, MD as Consulting Physician (Hematology) [name], CRNP as Nurse Practitioner (Nurse Practitioner) Lynnda Shields, Frances Nickels, DO as Referring Physician (Osteopathic Medicine)   Date of Service:  09/24/2017   CHIEF COMPLAINTS:  Follow up recurrent [facility]  Oncology History   Cancer Staging Hepatocellular carcinoma [facility]) Staging form: Liver (Excluding Intrahepatic Bile Ducts), AJCC 7th Edition - Clinical stage from 04/16/2012: Stage II (T2(m), N0, M0) - Signed by Truitt Merle, MD on 10/12/2015       Hepatocellular carcinoma ([place])   09/14/2012 Tumor Marker    AFP 28.9    04/16/2013 Imaging    Abdominal MRI with and without contrast reviewed multifocal (4) liver lesions in both left and right lobes, most consistent with HCC, largest measuring 2.7 cm    04/16/2013 Initial Diagnosis    Hepatocellular carcinoma ([place])    04/28/2013 Procedure    Right TACE with lipiodol     06/15/2013 Procedure    Left TACE with [facility]     07/14/2013 Procedure    Right TACE with [facility]    09/05/2015 Imaging    CT abdomen with and without contrast showed a new 5.0 x 2.3 cm mass in the hepatic segment 8, invading and occluding the right anterior portal vein, most compatible with [place]. A portacaval node measuring 3.0 x 1.4 cm, previously 2.9 x 1.1 cm.    10/11/2015 Tumor Marker    AFP 5.3    11/11/2015 - 02/03/2016 Chemotherapy    sorafenib 200mg  bid started on 11/11/2015, increased to 400mg  bid in 2 weeks, stopped due to poor tolerance and probable disease progression     11/14/2015 Imaging    CT CAP 11/14/2015 IMPRESSION: Chest Impression: 1. RIGHT upper lobe pulmonary nodule is indeterminate. No  comparison available. 2. Ground-glass opacity and peripheral nodules in the RIGHT middle lobe appear post infectious or inflammatory.  Abdomen / Pelvis Impression: 1. 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 Thrombus also likely within the LEFT and RIGHT portal vein. Difficult to distinguish tumor thrombus versus bland thrombus. 2. Individual lesions are difficult to define in the RIGHT hepatic lobe. There is overall impression of progression of disease in the RIGHT hepatic lobe with multiple ill-defined enhancing lesions. 3. Periportal and shotty retroperitoneal adenopathy similar to prior.    01/25/2016 Imaging    CT Abdomen Pelvis w/ Contrast IMPRESSION: 1. Findings consistent with multifocal liver carcinoma with extensive portal vein thrombosis, and subsequent development of porta hepatis venous collaterals. Thrombus in the central portal vein is no longer expansile, but still expands the Dillon peripheral portal vein and the right and left portal veins. There are few right liver bile ducts there are now dilated, which suggests mild progression of liver disease. 2. There is mild splenomegaly consistent or venous hypertension, which has mildly increased from the prior CT. 3. There are new lung abnormalities with interstitial thickening and ill-defined peribronchovascular nodular opacities mostly at the right lung base with a smaller area noted along the medial left lower lobe. The etiology may be infectious. It may be inflammatory, possibly related to chemotherapy. Neoplastic disease is also possible. 4. There is no other evidence of metastatic disease within  the abdomen or pelvis. No acute findings within the abdomen or pelvis    01/25/2016 - 01/25/2016 Hospital Admission    Pt was seen in ED for worsening abdominal pain, treated with pain meds     02/04/2016 -  Chemotherapy    Nivolumab  240mg  every 2 weeks, started on 02/04/2016; on 04/30/16 switched to every 3 weeks due to fatigue; change from 240mg  to 480mg  every 4 weeks starting 09/03/2016. Given his poor toleration, will reduce him to every 2 weeks at 240mg  starting 01/02/17. Changed to every 3 weeks on 04/09/17, and changed back to every 2 weeks on 06/11/2017. Postponed due to Suboxone reaction.       02/14/2016 Imaging    CT CAP w/ contrast 1. Slight interval increase in size of the mediastinal lymph nodes. 2. Persistent extensive lower lobe peribronchial thickening and patchy infiltrates. Possible aspiration pneumonia. 3. Persistent tree-in-bud appearance in the right middle lobe, likely chronic inflammation or atypical infection. 4. Improved CT appearance of the liver. Two small residual arterial phase enhancing lesions are identified. 5. Chronic portal vein thrombosis with cavernous transformation of portal venous collaterals. Esophageal varices are again noted. 6. Stable upper abdominal lymph nodes likely related to cirrhosis.     04/16/2016 Tumor Marker    AFP 4.1     04/17/2016 Imaging     CT CAP W PELVIS IMPRESSION: 1. Stable mild mediastinal lymphadenopathy. 2. Interval improvement in the bibasilar peribronchial thickening, airway impaction, and peribronchovascular nodularity. Imaging features suggest marked improvement in bilateral low the atypical infection. Changes in the right middle lobe with Dillon stable and may represent scarring from previous infectious/inflammatory etiology. 3. The small hypervascular lesions seen in the anterior left liver on the previous study are not evident today. There is some subtle, ill-defined hyperenhancement in the central left liver which is nonspecific. Attention to this area on followup imaging recommended. 4. Chronic portal vein inclusion with cavernous transformation in the porta hepatis and paraesophageal varices. 5. Stable appearance hepatoduodenal and  retroperitoneal lymphadenopathy, likely chronic and related to liver disease.    04/17/2016 Imaging    CT CAP CONTRAST IMPRESSION: 1. Stable mild mediastinal lymphadenopathy. 2. Interval improvement in the bibasilar peribronchial thickening, airway impaction, and peribronchovascular nodularity. Imaging features suggest marked improvement in bilateral low the atypical infection. Changes in the right middle lobe with Dillon stable and may represent scarring from previous infectious/inflammatory etiology. 3. The small hypervascular lesions seen in the anterior left liver on the previous study are not evident today. There is some subtle, ill-defined hyperenhancement in the central left liver which is nonspecific. Attention to this area on followup imaging recommended. 4. Chronic portal vein inclusion with cavernous transformation in the porta hepatis and paraesophageal varices. 5. Stable appearance hepatoduodenal and retroperitoneal lymphadenopathy, likely chronic and related to liver disease.    05/21/2016 Imaging    MR Abdomen W WO Contrast IMPRESSION: 1. Limited motion degraded scan. Cirrhosis. No evidence of a liver mass within these limitations. 2. Mild splenomegaly. No ascites. Stable mild paraumbilical and gastroesophageal varices. 3. Stable nonspecific mild retroperitoneal lymphadenopathy. 4. 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    06/04/2016 Tumor Marker    AFP 3.5     08/25/2016 Imaging    MR Abdomen w wo contrast  IMPRESSION: 1. Mild-to-moderate motion degradation, preferentially involving the pre and postcontrast dynamic images. On follow-up, given the extent of motion on this exam, multiphase CT may be preferred. 2. Given these limitations, no  evidence of hepatocellular carcinoma. 3. Marked cirrhosis and portal venous hypertension. 4. Slight increase in abdominal adenopathy, which is most likely reactive. Recommend  attention on follow-up. 5. Cholelithiasis.     11/02/2016 Procedure    Upper endoscopy and colonoscopy  Diagnosis 1. Stomach, biopsy - MILD CHRONIC GASTRITIS. - NEGATIVE FOR HELICOBACTER PYLORI. - NO INTESTINAL METAPLASIA, DYSPLASIA, OR MALIGNANCY. 2. Colon, polyp(s), opposite ileocecal valve - TUBULAR ADENOMA. - NO HIGH GRADE DYSPLASIA OR MALIGNANCY. 3. Rectum, polyp(s) - HYPERPLASTIC POLYP. - NO DYSPLASIA OR MALIGNANCY.    01/08/2017 Imaging    CT Chest WO Contrast 01/08/17  IMPRESSION: 1. Stable chest CT. 2. Prominent to mildly enlarged mediastinal lymph nodes are stable. Based on stability, these are favored to be reactive. 3. Stable emphysema and right upper lobe nodularity. 4. Mild atherosclerosis.    01/08/2017 Imaging    MRI Abd W WO Contrast 01/08/17  IMPRESSION: 1. Cirrhotic liver now with severe iron deposition throughout the hepatic parenchyma, as well as areas of developing confluent hepatic fibrosis. Today's study clearly demonstrates 2 new hypervascular nodules in segment 4A of the liver, which demonstrate washout but no definite pseudocapsule, the largest which measures 1.5 cm. At this time, these are considered suspicious for hepatocellular carcinoma, bladder classified as Li-RADS 3 lesions. 2. 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 3. Cholelithiasis without evidence of acute cholecystitis at this time. 4. Mild splenomegaly.    03/08/2017 Imaging    MRI Abdomen W WO Contrast 03/08/17 IMPRESSION: 1. 15 mm hypervascular lesion identified on the previous study is stable in the interval measuring 14 mm today. A lesion of this size demonstrating arterial phase hypervascularity and washout is consistent with LI-RADS 4, suggestive of but not diagnostic for hepatoma. Follow-up MRI in 3 months may be warranted. 2. 9 mm hypervascular lesion identified in segment IV on the previous study is not identified today.  Attention on follow-up is recommended. 3. 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 4. Cholelithiasis.    03/27/2017 Imaging    MRI Abdomen W WO Contrast 09/21/17 IMPRESSION: 1. Similar size but increased visualization/distinctness of a segment 4A lesion. This is most consistent with LI-RADS 3 lesion. No new liver lesions identified. 2. Cirrhosis with portal venous hypertension and cavernous transformation of the portal vein. 3. Motion degradation. 4.  Aortic Atherosclerosis (ICD10-I70.0). 5. Cholelithiasis.    03/27/2017 Imaging    CT Chest W contrast 09/21/17 IMPRESSION: 1. Mild mediastinal and right hilar adenopathy is stable and nonspecific. 2. Clustered nodularity in the posterior right upper lobe is stable. No new or progressive pulmonary nodularity. Suggest continued chest CT surveillance. 3. Patchy tree-in-bud opacities in the right middle lobe and bilateral lower lobes, stable. New patchy peribronchovascular ground-glass opacity in the left lower lobe. Findings suggest acute on chronic bronchiolitis/pneumonitis, such as due to recurrent aspiration. 4. 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 Please see the separate concurrent MRI abdomen report for the findings in the upper abdomen.    06/01/2017 Imaging    CT CAP w contrast IMPRESSION: 1. Stable size of the dominant arterial phase enhancing lesion in segment 4a of the liver, currently measuring 1.4 by 1.0 cm. There is a questionable second focus measuring 0.7 by 0.4 cm in segment 2 of the liver. Both merit surveillance. 2. A porta hepatis lymph node has enlarged compared to the prior MRI, previously 1.2 cm and currently 2.0  cm. Although potentially reactive, a malignant lymph node could also demonstrate such size increase. This likewise merits careful surveillance. Otherwise is borderline  adenopathy in the chest and abdomen. 3. Mild reduced nodularity associated with a right apical bandlike density, this appearance likewise merits surveillance. 4. Other imaging findings of potential clinical significance: Aortic Atherosclerosis (ICD10-I70.0). Emphysema (ICD10-J43.9). Airway thickening is present, suggesting bronchitis or reactive airways disease. Contracted gallbladder with small gallstones. Cavernous transformation of the portal vein with recanalized umbilical vein indicating portal venous hypertension. Cirrhosis. Capsular calcifications along the glenohumeral and hip joints.    09/21/2017 Imaging    CT Chest W contrast 09/21/17 IMPRESSION: 1. Mild mediastinal and right hilar adenopathy is stable and nonspecific. 2. Clustered nodularity in the posterior right upper lobe is stable. No new or progressive pulmonary nodularity. Suggest continued chest CT surveillance. 3. Patchy tree-in-bud opacities in the right middle lobe and bilateral lower lobes, stable. New patchy peribronchovascular ground-glass opacity in the left lower lobe. Findings suggest acute on chronic bronchiolitis/pneumonitis, such as due to recurrent aspiration. 4. 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 Please see the separate concurrent MRI abdomen report for the findings in the upper abdomen.    09/21/2017 Imaging     MRI Abdomen W WO Contrast 09/21/17 IMPRESSION: 1. Similar size but increased visualization/distinctness of a segment 4A lesion. This is most consistent with LI-RADS 3 lesion. No new liver lesions identified. 2. Cirrhosis with portal venous hypertension and cavernous transformation of the portal vein. 3. Motion degradation. 4.  Aortic Atherosclerosis (ICD10-I70.0). 5. Cholelithiasis.     HISTORY OF PRESENTING ILLNESS (10/11/2015):  Kevin Dillon 67 y.o. male with past medical history of treated hepatitis C, liver cirrhosis, and hepatocellular  carcinoma is here because of recurrent hepatocellular carcinoma. He is accompanied by his significant other Kevin Dillon and her sister.  He was diagnosed with hepatocellular carcinoma in 2015, his initial image result is not available and staging is unknown. He was seen by interventional radiologist Dr. Delice Lesch at [facility] in [place] and underwent TACE procedure 3 times in 2015. He was subsequently followed, and recently repeated CT scan showed a new 5.0 cm mass in segment 8, with portal vein invasion. He was felt not to be a candidate for further liver targeted therapy, and was referred to [place] for further systemic therapy.  He complains about mid epigastric pain for the past 2 years, which significantly improved after his TACE procedure in 2015. It has been getting worse lately in the past 6 months. He states he gets about 7-8 out of 10, persistent, he has been taking oxycodone 20 mg every 6 hours, but his pain is not well controlled. He is quite fatigued, is low appetite. He last 40 lbs in the pat 4 months. He is able to function at home, in trying to remain to be physically active.  His hepatitis C was successfully treated. He has history of hepatitis C, complicated with ascites and encephalopathy, but it has been well controlled with medical management.   CURRENT THERAPY:   Nivolumab 240mg  every 2 weeks, started on 02/04/2016; on 04/30/16 switched to every 3 weeks due to fatigue; change from 240mg  to 480mg  every 4 weeks starting 09/03/2016. Given his poor toleration, reduced back to every 2 weeks at 240mg  starting 01/02/17. Changed to every 3 weeks on 04/09/17, and changed back to every 2 weeks on 06/11/2017, but non-complaint due to his pain management issue    INTERIM HISTORY:   Kevin Dillon is a 67 y.o. male who returns for follow up of recurrent liver cancer, ongoing treatment and review of recent scans. He presents to the clinic today by himself. He notes his PCP altered his Apresoline 200mg  in  AM and 100mg  in the PM along with another HTN medication at 200mg  in the AM daily. He has not pain and weaned himself off oxycodone. He denies pain in the last 3-4 days. He denies bleeding from Xarelto. He has been taking 2 xanax every evening to help him sleep. He would like a refill today.    MEDICAL HISTORY:  Past Medical History:  Diagnosis Date  . Cancer ([place])    liver  . Cholelithiasis   . Chronic hepatitis C ([place])   . Chronic knee pain   . Chronic left shoulder pain   . CVA (cerebral infarction)   . Gout   . Hepatitis C    genotype 1b.  pt has been vaccinated against hep A and B.  . Hypertension   . Osteoarthritis   . Schatzki's ring   . Tubular adenoma of colon 12/2011    SURGICAL HISTORY: Past Surgical History:  Procedure Laterality Date  . AMPUTATION Left 09/17/2012   Procedure: SMALL FINGER EXTENSOR TENDON REPAIR; METACARPAL LEVEL AMPUTATION RING FINGER; PROXIMAL PHALANX LEVEL AMPUTATION LONG FINGER;  Surgeon: Schuyler Amor, MD;  Location: [facility];  Service: Orthopedics;  Laterality: Left;  . Arm surgery     right/plate in arm  . BIOPSY  11/02/2016   Procedure: BIOPSY;  Surgeon: Daneil Dolin, MD;  [facility];  Service: Endoscopy;;  gastric   . COLONOSCOPY WITH ESOPHAGOGASTRODUODENOSCOPY (EGD)  12/30/2011   RMR: Noncritical Schatzki's ring;  Hiatal hernia, Tubular ADENOMA removed from splenic flexure, otherwise normal colonoscopy  . COLONOSCOPY WITH PROPOFOL N/A 11/02/2016   Procedure: COLONOSCOPY WITH PROPOFOL;  Surgeon: Daneil Dolin, MD;  [facility];  Service: Endoscopy;  Laterality: N/A;  1045  . ESOPHAGOGASTRODUODENOSCOPY (EGD) WITH PROPOFOL N/A 11/02/2016   Procedure: ESOPHAGOGASTRODUODENOSCOPY (EGD) WITH PROPOFOL;  Surgeon: Daneil Dolin, MD;  [facility];  Service: Endoscopy;  Laterality: N/A;  . IR FLUORO GUIDE PORT INSERTION RIGHT  10/21/2016  . IR US GUIDE VASC ACCESS RIGHT  10/21/2016  . LEG SURGERY     left   . POLYPECTOMY  11/02/2016   Procedure: POLYPECTOMY;  Surgeon: Daneil Dolin, MD;  [facility];  Service: Endoscopy;;  colon  . WOUND EXPLORATION Left 09/17/2012   Procedure: WOUND EXPLORATION;  Surgeon: Schuyler Amor, MD;  Location: [place];  Service: Orthopedics;  Laterality: Left;    SOCIAL HISTORY: Social History   Socioeconomic History  . Marital status: Significant Other    Spouse name: Not on file  . Number of children: 0  . Years of education: Not on file  . Highest education level: Not on file  Occupational History  . Occupation: disabled  Social Needs  . Financial resource strain: Not on file  . Food insecurity:    Worry: Not on file    Inability: Not on file  . Transportation needs:    Medical: Not on file    Non-medical: Not on file  Tobacco Use  . Smoking status: Former Smoker    Packs/day: 0.50    Years: 40.00    Pack years: 20.00    Types: Cigarettes    Last attempt to quit: 01/19/2012    Years since quitting: 5.6  .  Smokeless tobacco: Never Used  . Tobacco comment: Smokes 1/2 pack of cigarettes daily  Substance and Sexual Activity  . Alcohol use: No    Comment: HX 2 beers 3-4 days per week; QUIT DEC 2013  . Drug use: No    Comment: Hx cocaine yrs ago, Last marijuana OCT 2013  . Sexual activity: Not Currently    Partners: Female  Lifestyle  . Physical activity:    Days per week: Not on file    Minutes per session: Not on file  . Stress: Not on file  Relationships  . Social connections:    Talks on phone: Not on file    Gets together: Not on file    Attends religious service: Not on file    Active member of club or organization: Not on file    Attends meetings of clubs or organizations: Not on file    Relationship status: Not on file  . Intimate partner violence:    Fear of current or ex partner: Not on file    Emotionally abused: Not on file    Physically abused: Not on file    Forced sexual activity: Not on file  Other  Topics Concern  . Not on file  Social History Narrative   Lives w/ significant other, Kevin Dillon    FAMILY HISTORY: Family History  Problem Relation Age of Onset  . Cirrhosis Father 30  . Cancer Father        liver cancer   . Lung cancer Mother 69  . Colon cancer Neg Hx     ALLERGIES:  has No Known Allergies.  MEDICATIONS:  Current Outpatient Medications  Medication Sig Dispense Refill  . ALPRAZolam (XANAX) 1 MG tablet Take 1 tablet (1 mg total) by mouth 2 (two) times daily as needed for anxiety. 60 tablet 0  . hydrALAZINE (APRESOLINE) 25 MG tablet Take by mouth daily.     Marland Kitchen lactulose (CHRONULAC) 10 GM/15ML solution Take 30 g by mouth daily as needed for moderate constipation.     . lidocaine-prilocaine (EMLA) cream Apply 1 application topically as needed. Apply to portacath 1 1/2 hours - 2 hours prior to procedures as needed. 30 g 1  . megestrol (MEGACE) 40 MG/ML suspension Take 400 mg by mouth daily.     . mirtazapine (REMERON) 7.5 MG tablet Take 1 tablet (7.5 mg total) by mouth at bedtime. 30 tablet 1  . Multiple Vitamins-Minerals (CENTRUM SILVER PO) Take 1 tablet by mouth daily.     Marland Kitchen oxyCODONE (ROXICODONE) 15 MG immediate release tablet Take 1 tablet (15 mg total) by mouth every 6 (six) hours as needed for pain. 60 tablet 0  . potassium chloride SA (K-DUR,KLOR-CON) 20 MEQ tablet Take 1 tablet (20 mEq total) by mouth daily. Take 20 meq by mouth daily for 7 days. 30 tablet 1  . prochlorperazine (COMPAZINE) 10 MG tablet TAKE 1 TABLET BY MOUTH EVERY 8 HOURS AS NEEDED FOR NAUSEA/VOMITING 30 tablet 1  . rivaroxaban (XARELTO) 20 MG TABS tablet TAKE 1 TABLET BY MOUTH DAILY WITH SUPPER. 30 tablet 3   No current facility-administered medications for this visit.     REVIEW OF SYSTEMS:  Constitutional: Denies fevers, chills or abnormal night sweats  (+) weight stable Eyes: Denies blurriness of vision, double vision or watery eyes Ears, nose, mouth, throat, and face: Denies  mucositis or sore throat  Respiratory: Denies cough, dyspnea or wheezes Cardiovascular: Denies palpitation, chest discomfort or lower extremity swelling Gastrointestinal:  Denies nausea,  heartburn  Skin: Denies abnormal skin rashes Lymphatics: Denies new lymphadenopathy or easy bruising Neurological:Denies numbness, tingling or new weaknesses Behavioral/Psych: normal All other systems were reviewed with the patient and are negative.  PHYSICAL EXAMINATION  ECOG PERFORMANCE STATUS: 2  Vitals:   09/24/17 0959  BP: (!) 196/70  Pulse: 86  Resp: 20  Temp: 97.8 F (36.6 C)  SpO2: 97%   Filed Weights   09/24/17 0959  Weight: 193 lb 3.2 oz (87.6 kg)     GENERAL:alert, no distress and comfortable SKIN: skin color, texture, turgor are normal, no rashes or significant lesions EYES: normal, conjunctiva are pink and non-injected, sclera clear OROPHARYNX:no exudate, no erythema and lips, buccal mucosa, and tongue normal  NECK: supple, thyroid normal size, non-tender, without nodularity LYMPH:  no palpable lymphadenopathy in the cervical, axillary or inguinal LUNGS: clear to auscultation and percussion with normal breathing effort HEART: regular rate & rhythm and no murmurs and no lower extremity edema ABDOMEN:(+) abdominal bloating with mild RUQ tenderness Musculoskeletal:no cyanosis of digits and no clubbing (+) bilateral lower limb pain PSYCH: alert & oriented x 3 with fluent speech NEURO: no focal motor/sensory deficits  LABORATORY DATA:  I have reviewed the data as listed CBC Latest Ref Rng & Units 09/21/2017 09/03/2017 08/27/2017  WBC 4.0 - 10.3 K/uL 8.2 8.8 8.0  Hemoglobin 13.0 - 17.1 g/dL 11.4(L) 10.5(L) 12.7(L)  Hematocrit 38.4 - 49.9 % 33.3(L) 31.5(L) 36.3(L)  Platelets 140 - 400 K/uL 192 153 219   CMP Latest Ref Rng & Units 09/21/2017 09/03/2017 08/27/2017  Glucose 70 - 99 mg/dL 86 151(H) 115(H)  BUN 8 - 23 mg/dL 15 15 17   Creatinine 0.61 - 1.24 mg/dL 1.36(H) 1.42(H) 1.24  Sodium  135 - 145 mmol/L 138 141 136  Potassium 3.5 - 5.1 mmol/L 3.9 3.3(L) 3.5  Chloride 98 - 111 mmol/L 109 108 104  CO2 22 - 32 mmol/L 18(L) 25 23  Calcium 8.9 - 10.3 mg/dL 9.4 8.5(L) 9.0  Total Protein 6.5 - 8.1 g/dL 7.8 6.6 7.6  Total Bilirubin 0.3 - 1.2 mg/dL 0.5 0.5 0.8  Alkaline Phos 38 - 126 U/L 96 90 109  AST 15 - 41 U/L 23 22 31   ALT 0 - 44 U/L 14 14 27     AFP:  09/14/2012: 28.9 10/11/2015: 5.3 11/01/2015: 3.7 12/02/2015: 3.7 01/22/2016: 3.4 02/18/2016: 7.1 03/17/16: 5.8 04/16/16: 4.1 06/04/2016: 3.5 07/16/2016:4.4 08/27/2016: 5.6 10/08/16: 4.3 11/05/16: 4.8 12/03/16: 5.4 01/01/17: 5.1 01/29/17: 4.5 02/26/17: 5.5 04/09/17: 4.0 04/30/17: 3.3 06/11/17: 4.0 07/06/17: 4.4 08/27/17: 3.8 09/21/17: 4.8   PATHOLOGY   Diagnosis 11/02/16  1. Stomach, biopsy - MILD CHRONIC GASTRITIS. - NEGATIVE FOR HELICOBACTER PYLORI. - NO INTESTINAL METAPLASIA, DYSPLASIA, OR MALIGNANCY. 2. Colon, polyp(s), opposite ileocecal valve - TUBULAR ADENOMA. - NO HIGH GRADE DYSPLASIA OR MALIGNANCY. 3. Rectum, polyp(s) - HYPERPLASTIC POLYP. - NO DYSPLASIA OR MALIGNANCY. Microscopic Comment 1. A Warthin-Starry stain is performed to determine the possibility of the presence of Helicobacter pylori. The Warthin-Starry stain is negative for organisms of Helicobacter pylori.   PROCEDURE  Upper Endoscopy by Dr. [name] 11/02/16  IMPRESSION:  - mildly obstructing Schatzki ring. - dilated with scope passage. erosive reflux esophagitis - Small hiatal hernia. - Portal hypertensive gastropathy. biopsied - Normal duodenal bulb and second portion of the duodenum. Biopsied.  Colonoscopy by Dr. [name] 11/02/16  IMPRESSION:  - Two 3 to 5 mm polyps in the rectum and at the ileocecal valve, removed with a cold snare. Resected and retrieved. - Diverticulosis in  the sigmoid colon and in the descending colon. - The examination was otherwise normal on direct and retroflexion views.   RADIOGRAPHIC STUDIES: I have personally  reviewed the radiological images as listed and agreed with the findings in the report.  CT Chest W contrast 09/21/17 IMPRESSION: 1. Mild mediastinal and right hilar adenopathy is stable and nonspecific. 2. Clustered nodularity in the posterior right upper lobe is stable. No new or progressive pulmonary nodularity. Suggest continued chest CT surveillance. 3. Patchy tree-in-bud opacities in the right middle lobe and bilateral lower lobes, stable. New patchy peribronchovascular ground-glass opacity in the left lower lobe. Findings suggest acute on chronic bronchiolitis/pneumonitis, such as due to recurrent aspiration. 4. 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 Please see the separate concurrent MRI abdomen report for the findings in the upper abdomen.   MRI Abdomen W WO Contrast 09/21/17 IMPRESSION: 1. Similar size but increased visualization/distinctness of a segment 4A lesion. This is most consistent with LI-RADS 3 lesion. No new liver lesions identified. 2. Cirrhosis with portal venous hypertension and cavernous transformation of the portal vein. 3. Motion degradation. 4.  Aortic Atherosclerosis (ICD10-I70.0). 5. Cholelithiasis.  04/17/16 CT CAP W PELVIS IMPRESSION: 1. Stable mild mediastinal lymphadenopathy. 2. Interval improvement in the bibasilar peribronchial thickening, airway impaction, and peribronchovascular nodularity. Imaging features suggest marked improvement in bilateral low the atypical infection. Changes in the right middle lobe with Dillon stable and may represent scarring from previous infectious/inflammatory etiology. 3. The small hypervascular lesions seen in the anterior left liver on the previous study are not evident today. There is some subtle, ill-defined hyperenhancement in the central left liver which is nonspecific. Attention to this area on followup imaging recommended. 4. Chronic portal vein inclusion with  cavernous transformation in the porta hepatis and paraesophageal varices. 5. Stable appearance hepatoduodenal and retroperitoneal lymphadenopathy, likely chronic and related to liver disease.   MR Abdomen W WO Contrast 05/21/16 IMPRESSION: 1. Limited motion degraded scan. Cirrhosis. No evidence of a liver mass within these limitations. 2. Mild splenomegaly. No ascites. Stable mild paraumbilical and gastroesophageal varices. 3. Stable nonspecific mild retroperitoneal lymphadenopathy. 4. 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  MR Abdomen w wo contrast 08/25/2016 IMPRESSION: 1. Mild-to-moderate motion degradation, preferentially involving the pre and postcontrast dynamic images. On follow-up, given the extent of motion on this exam, multiphase CT may be preferred. 2. Given these limitations, no evidence of hepatocellular carcinoma. 3. Marked cirrhosis and portal venous hypertension. 4. Slight increase in abdominal adenopathy, which is most likely reactive. Recommend attention on follow-up. 5. Cholelithiasis.  CT Chest WO Contrast 01/08/17  IMPRESSION: 1. Stable chest CT. 2. Prominent to mildly enlarged mediastinal lymph nodes are stable. Based on stability, these are favored to be reactive. 3. Stable emphysema and right upper lobe nodularity. 4. Mild atherosclerosis.   MRI Abd W WO Contrast 01/08/17  IMPRESSION: 1. Cirrhotic liver now with severe iron deposition throughout the hepatic parenchyma, as well as areas of developing confluent hepatic fibrosis. Today's study clearly demonstrates 2 new hypervascular nodules in segment 4A of the liver, which demonstrate washout but no definite pseudocapsule, the largest which measures 1.5 cm. At this time, these are considered suspicious for hepatocellular carcinoma, bladder classified as Li-RADS 3 lesions. 2. Occlusion of the portal vein with cavernous transformation in the porta  hepatis. 3. Cholelithiasis without evidence of acute cholecystitis at this time. 4. Mild splenomegaly.  MRI Abdomen W WO Contrast 03/08/17 IMPRESSION: 1. 15  mm hypervascular lesion identified on the previous study is stable in the interval measuring 14 mm today. A lesion of this size demonstrating arterial phase hypervascularity and washout is consistent with LI-RADS 4, suggestive of but not diagnostic for hepatoma. Follow-up MRI in 3 months may be warranted. 2. 9 mm hypervascular lesion identified in segment IV on the previous study is not identified today. Attention on follow-up is recommended. 3. 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 4. Cholelithiasis.  ASSESSMENT & PLAN:  Kevin Dillon is a 67 y.o. Caucasian male with past medical history of successfully treated hepatitis C, liver cirrhosis, history of ascites and encephalopathy, recurrent HCC   1. Recurrent HCC, initially stage II, recurrent in 2017  -I previously reviewed his previous image and outside medical records extensively, confirmed key findings: He initially had a multifocal disease, status post TACE 3 times in 2015 -He previously developed symptomatic local recurrence in 08/2015, with a 5cm new lesion in segment 8 with direct invasion into portal vein, and a portocaval lymph node. He is not a candidate for liver targeted therapy per his IR Dr. Delice Lesch -He was started on first line sorafenib, tolerated poorly. His previous CT abdomen and pelvis on 01/25/2016 revealed extensive portal hypertension, and a probable cancer progression in the liver. -His treatment was changed to immunotherapy Nivolumab on 02/04/16, he tolerated very well, and clinically he was doing much better, with less pain and better energy.  -I previously discussed his restaging CT scan which was done on 02/14/2016, it actually showed decreased liver cancer size compared  to the scan a month ago. No other new metastasis. -restaging CT from 04/17/2016 showed no visible liver mass, but the imaging was very limited due to his liver cirrhosis  -I previously reviewed his recent abdominal MRI from 05/21/2016, which showed no visible mass in the liver. He has had complete radiographic response. -I previously reviewed his restaging MRI from 08/25/2016, which showed no visible liver mass, but the images quality with significant compromised due to the motion.  -Nivo has previously been changed to 480mg  every 4 weeks in 08/2016. Due to poor toleration (fatigue) I changed his nivo back to 240mg  every 2 weeks starting 01/02/17.  -We previously discussed his MRI abd and CT chest from 01/08/17 which showed no concern for metastatic disease in the chest. MRI shows two small new lesions in his liver. This was reviewed in tumor board, felt to be indeterminate and close f/u with imaging was recommended -MRI Abdomen from 03/08/17 reveals that his 15 mm hypervascular lesion identified on the previous study is stable in the interval measuring 14 mm, demonstrating arterial phase hypervascularity and washout is consistent with LI-RADS 4, suggestive of but not diagnostic for hepatoma, 9 mm hypervascular lesion identified in segment IV on the previous study is not identified today. I previously discussed results with pt. I recommended continue Nivolumab. -On 04/09/17, he requested to change treatment to every 3 weeks at 240 mg dose due to the fatigue. He knows the standard is every 2 weeks, but I will accommodated his request. -CT CAP w contrast on 06/01/2017 showed: Stable size of the dominant arterial phase enhancing lesion in segment 4a of the liver, currently measuring 1.4 by 1.0 cm. There is a questionable second focus measuring 0.7 by 0.4 cm in segment 2 of the liver. Both merit surveillance. A portal hepatis lymph node has enlarged compared to the prior MRI, previously 1.2  cm and currently 2.0 cm.  Although potentially reactive, a malignant lymph node could also demonstrate such size increase. This likewise merits careful surveillance. Otherwise is borderline adenopathy in the chest and abdomen. Mild reduced nodularity associated with a right apical bandlike density, this appearance likewise merits surveillance. -Due to the probably slow disease progression, I previously recommended pt to have nivolumab q 2 weeks instead of  q 3 weeks on 06/11/17. He agreed.  -We previously reviewed his case in our GI tumor board, the consensus is continue to Nivolumab, given no significant disease progression on image. -Since he presented to the ED on 06/18/17 due to reaction to Suboxone he skipped a few Nivo treatment, he did restart on 07/06/17, but noncompliant, due to " not feeling well" since we cut his narcotics. -I previously recommended he sees pain specialist Dr. Lanetta Inch, but he rejects and prefers to see PCP Dr Sheryle Hail. -We discussed his CT Chest and MRI abdomen from 09/21/17 which shows stable lesions in liver which is LI-RADS 3 lesion and stable pulmonary nodularity, no evidence of disease progression  -he is clinically doing better, he has actually been off narcotics for several days now, he is trying to wean it off himself  -I discussed returning to nivolumab every 2 weeks, now that he is doing much better. He is agreeable.  -He is clinically stable, will continue to Nivolumab every 2 weeks. This week's labs reveiwed, iron improved, Hg at 11.4, Cr at 1.36, overall adequate to proceed with Nivo today  -F/u in 4 weeks    2. Abdominal pain, low back pain -His abdominal pain previously improved since he started treatment. With oxycodone and OxyContin, he was on very high dose and overall pain was controlled  -I previously prescribed flexeril in attempt to help with his pain. Due to his high tolerance, he can take up to 2 a day. -His prior constipation was likely related to his narcotic use, he uses colace  and I advised him he can use Miralax or Senokot that is stronger.  -I previously had a lengthy conversation with pt about reducing his narcotic pain medication and the risks associated with taking high dose (including shortening his life) for a long period of time. His cancer is well controlled currently and it should not cause him a lot of pain. I am concerned that he has narcotic addition, and his depression/anxiety may also contribute to his pain. He is reluctant to reducing his dose. I offered Cymbalta on 06/11/17 and he agreed to try. Our social worker Webb Silversmith will see him same day and I previously recommended that he see psychiatrist someone to help manage his depression/anxiety/addiction.  -He was recently prescribed Suboxone on 06/17/17 by Dr. Rachael Fee. This reacted to Suboxone and had to go to ED the next day. He last took Suboxone on 06/21/17 7:00am. I explained Suboxone removed prior high tolerance to pain medication. -I previously discussed his pain management will likely be long term at this point. I discussed his options in great detail with him and his wife. He can return to Dr. Rachael Fee who plans to change his Suboxone to Subotex to prevent another reaction. Pt declined firmly. His other option is for me to send a referral to another pain specialist. He declined at this point -I previously agreed to write his pain meds, but started him on very low-dose of oxycodone 5 mg every 6 hours as needed, then slowly increased him to oxycodone 10mg  then 15mg  every 6 hours, I  do not plan to increase the dose unless he has disease progression or injury. I also discussed the option of trying Cymbalta again as this is not addictive. He notes this made him nauseous before but is willing to try.  -I previously discussed we need to find his root cased of pain as this is not solely cancer related pain. I explained I would like him to get treatment for his depression, and possible addiction, to help improve  his overall feeling. He agreed to see. Dr. Luan Pulling in 10/2017. I will not refill any of his narcotics in the future.  -He currently has no pain and has is weaning himself off oxycodone. -refill Xanax today (09/24/17)   3. Portal vein thrombosis -continue Xarelto  -We previously discussed the moderate to high risk of bleeding with Xarelto due to his liver cirrhosis and he knows to avoid injury and fall  -if he develops anemia or GI bleeding again, will stop Xarelto  4 Liver cirrhosis secondary to hepatitis C and alcohol, history of encephalopathy and ascites -He will continue follow-up with [facility], his ascites has been well controlled with diuretics, but seems getting worse previously and encouraged him to follow up with [facility]  -He knows to use laxative, especially lactulose for his constipation -He was given Lasix by [facility] to help his ascites. Since improving I suggest he reduce down to half tablet or once every other day.  -he will continue to follow up with [facility]  -He previously reported that he is not taking lasix as prescribed.  5. Hep C, successfully treated -Continue follow-up with liver clinic NP [facility]  6. Anxiety and insomnia  -He has Xanax for anxiety.  -continue Ativan 1 mg at bedtime as needed for insomnia, he knows not to take with Xanax together, he knows not to take during the day. He does not feel Ativan is working well for sleep -I refilled his Xanax today (09/24/17)   7. Fatigue and weight gain/loss -He reported Dillon fatigue and weakness since his narcotics dose was significantly decreased -His TSH was previously elevated but T3 and T4 levels were normal. We previously discussed the side effect of hypothyroidism from Nivolumab, I previously referred him to Dr. Loanne Drilling for evaluation to see if he needs low-dose Synthroid but he did not go. Will continue monitoring  -I do not suspect his weight gain is related to fluid retention. I previously suggested he hold Megace and reduce  ensure boosts to once daily. He should use a balanced diet and exercise to help.  -Given recent reaction to Suboxone he had not been eating much and loss 10 pounds in a little over a week.  -I previously discussed dietician referral and he declined. He had gained some weight back and weight currently stable  8. HTN -According to patient, his PCP increased him to Hyrdralazine 200mg  in AM and 100mg  in the PM daily, along with another BP medication he cannot name.  -His BP very elevated at 196/70 today (09/24/17). Will repeat in infusion room and if still high, will give 1 dose clonidine. -I encouraged him to follow up with his PCP.   9. Smoking  -Patient has started back smoking due to the fear of not getting better -I previously recommended a smoking cessation program. He refused.  10. Depression and anxiety  -The patient previously reported feelings of depression related to his diagnosis. -I am worried that he feels hopeless, he was seen by our social worker Webb Silversmith before who has referred him  to a psychiatrist [facility] -He notes being depressed due to his limited abilities. He denies thoughts of suicide or self harm.  -I previously sent referral to behavioral health (06/29/17), but he does not plan to go to appointment.  11. Iron deficient anemia  -10/16/16 lab results was consistent with iron deficiency, possible related to occluded GI bleeding -He is on Xarelto, no clinical overt bleeding. -He received IV feraheme 10/16/16 and in 10/2016  -He had GI work up with upper endoscopy and colonoscopy per Dr. [name] on 11/02/16. Biopsy of the stomach showed mild chronic gastritis, negative for H. Pylori or malignancy. Colon and rectal polyps were negative for dysplasia or malignancy. He will continue to f/u with GI -if he develops anemia or GI bleeding again, will stop Xarelto  -Iron at 35, Hg at 11.4 this week (09/21/17)  12. Goal of care discussion, DNR/DNI  -We previously discussed the  incurable nature of his cancer, and the overall poor prognosis, especially if he does not have good response to chemotherapy or progress on chemo -The patient understands the goal of care is palliative.    Plan -Scan reviewed, no disease progression.   -Xanax refilled today -Lab reviewed, adequate for Nivolumab today  -Lab, flush and nivolumab in 2 and 4 weeks -F/u in 4 weeks   All questions were answered. The patient knows to call the clinic with any problems, questions or concerns.  I spent 20 minutes counseling the patient face to face. Pt's partner had many questions, and I answered to her satisfaction.  The total time spent in the appointment was 25 minutes and Dillon than 50% was on counseling.  Oneal Deputy, am acting as scribe for Truitt Merle, MD.   I have reviewed the above documentation for accuracy and completeness, and I agree with the above.    Truitt Merle  09/24/2017

## 2017-09-24 ENCOUNTER — Other Ambulatory Visit: Payer: Medicare Other

## 2017-09-24 ENCOUNTER — Inpatient Hospital Stay: Payer: Medicare Other

## 2017-09-24 ENCOUNTER — Telehealth: Payer: Self-pay

## 2017-09-24 ENCOUNTER — Telehealth: Payer: Self-pay | Admitting: Hematology

## 2017-09-24 ENCOUNTER — Inpatient Hospital Stay (HOSPITAL_BASED_OUTPATIENT_CLINIC_OR_DEPARTMENT_OTHER): Payer: Medicare Other | Admitting: Hematology

## 2017-09-24 ENCOUNTER — Encounter: Payer: Self-pay | Admitting: Hematology

## 2017-09-24 VITALS — BP 196/70 | HR 86 | Temp 97.8°F | Resp 20 | Ht 73.0 in | Wt 193.2 lb

## 2017-09-24 DIAGNOSIS — Z5112 Encounter for antineoplastic immunotherapy: Secondary | ICD-10-CM | POA: Diagnosis not present

## 2017-09-24 DIAGNOSIS — K7031 Alcoholic cirrhosis of liver with ascites: Secondary | ICD-10-CM

## 2017-09-24 DIAGNOSIS — K5900 Constipation, unspecified: Secondary | ICD-10-CM

## 2017-09-24 DIAGNOSIS — F419 Anxiety disorder, unspecified: Secondary | ICD-10-CM | POA: Diagnosis not present

## 2017-09-24 DIAGNOSIS — G4700 Insomnia, unspecified: Secondary | ICD-10-CM | POA: Diagnosis not present

## 2017-09-24 DIAGNOSIS — C220 Liver cell carcinoma: Secondary | ICD-10-CM

## 2017-09-24 DIAGNOSIS — B182 Chronic viral hepatitis C: Secondary | ICD-10-CM | POA: Diagnosis not present

## 2017-09-24 DIAGNOSIS — Z7901 Long term (current) use of anticoagulants: Secondary | ICD-10-CM

## 2017-09-24 DIAGNOSIS — I81 Portal vein thrombosis: Secondary | ICD-10-CM

## 2017-09-24 DIAGNOSIS — Z801 Family history of malignant neoplasm of trachea, bronchus and lung: Secondary | ICD-10-CM

## 2017-09-24 DIAGNOSIS — I10 Essential (primary) hypertension: Secondary | ICD-10-CM | POA: Diagnosis not present

## 2017-09-24 DIAGNOSIS — E46 Unspecified protein-calorie malnutrition: Secondary | ICD-10-CM

## 2017-09-24 DIAGNOSIS — Z800 Family history of malignant neoplasm of digestive organs: Secondary | ICD-10-CM

## 2017-09-24 DIAGNOSIS — G893 Neoplasm related pain (acute) (chronic): Secondary | ICD-10-CM

## 2017-09-24 DIAGNOSIS — R530 Neoplastic (malignant) related fatigue: Secondary | ICD-10-CM | POA: Diagnosis not present

## 2017-09-24 DIAGNOSIS — K7469 Other cirrhosis of liver: Secondary | ICD-10-CM

## 2017-09-24 DIAGNOSIS — Z9221 Personal history of antineoplastic chemotherapy: Secondary | ICD-10-CM

## 2017-09-24 DIAGNOSIS — Z79899 Other long term (current) drug therapy: Secondary | ICD-10-CM

## 2017-09-24 DIAGNOSIS — F329 Major depressive disorder, single episode, unspecified: Secondary | ICD-10-CM

## 2017-09-24 DIAGNOSIS — D509 Iron deficiency anemia, unspecified: Secondary | ICD-10-CM

## 2017-09-24 DIAGNOSIS — Z87891 Personal history of nicotine dependence: Secondary | ICD-10-CM

## 2017-09-24 DIAGNOSIS — Z8619 Personal history of other infectious and parasitic diseases: Secondary | ICD-10-CM

## 2017-09-24 DIAGNOSIS — D500 Iron deficiency anemia secondary to blood loss (chronic): Secondary | ICD-10-CM

## 2017-09-24 MED ORDER — ALPRAZOLAM 1 MG PO TABS: 1.0000 mg | ORAL_TABLET | Freq: Two times a day (BID) | ORAL | 0 refills | Status: DC | PRN

## 2017-09-24 MED ORDER — SODIUM CHLORIDE 0.9% FLUSH
10.0000 mL | Freq: Once | INTRAVENOUS | Status: AC
  Administered 2017-09-24: 10 mL
  Filled 2017-09-24: qty 10

## 2017-09-24 MED ORDER — SODIUM CHLORIDE 0.9% FLUSH
10.0000 mL | INTRAVENOUS | Status: DC | PRN
  Administered 2017-09-24: 10 mL
  Filled 2017-09-24: qty 10

## 2017-09-24 MED ORDER — SODIUM CHLORIDE 0.9 % IV SOLN
240.0000 mg | Freq: Once | INTRAVENOUS | Status: AC
  Administered 2017-09-24: 240 mg via INTRAVENOUS
  Filled 2017-09-24: qty 24

## 2017-09-24 MED ORDER — SODIUM CHLORIDE 0.9 % IV SOLN
Freq: Once | INTRAVENOUS | Status: AC
  Administered 2017-09-24: 11:00:00 via INTRAVENOUS
  Filled 2017-09-24: qty 250

## 2017-09-24 MED ORDER — HEPARIN SOD (PORK) LOCK FLUSH 100 UNIT/ML IV SOLN
500.0000 [IU] | Freq: Once | INTRAVENOUS | Status: AC | PRN
  Administered 2017-09-24: 500 [IU]
  Filled 2017-09-24: qty 5

## 2017-09-24 MED FILL — ALPRAZolam 1 MG TABS: 1 | 30 days supply | Qty: 60 | Fill #0

## 2017-09-24 NOTE — Patient Instructions (Signed)
[facility] Discharge Instructions for Patients Receiving Chemotherapy  Today you received the following chemotherapy agents :  Opdivo.  To help prevent nausea and vomiting after your treatment, we encourage you to take your nausea medication as prescribed.   If you develop nausea and vomiting that is not controlled by your nausea medication, call the clinic.   BELOW ARE SYMPTOMS THAT SHOULD BE REPORTED IMMEDIATELY:  *FEVER GREATER THAN 100.5 F  *CHILLS WITH OR WITHOUT FEVER  NAUSEA AND VOMITING THAT IS NOT CONTROLLED WITH YOUR NAUSEA MEDICATION  *UNUSUAL SHORTNESS OF BREATH  *UNUSUAL BRUISING OR BLEEDING  TENDERNESS IN MOUTH AND THROAT WITH OR WITHOUT PRESENCE OF ULCERS  *URINARY PROBLEMS  *BOWEL PROBLEMS  UNUSUAL RASH Items with * indicate a potential emergency and should be followed up as soon as possible.  Feel free to call the clinic should you have any questions or concerns. The clinic phone number is [phone].  Please show the CHEMO ALERT CARD at check-in to the Emergency Department and triage nurse.   

## 2017-09-24 NOTE — Telephone Encounter (Signed)
Spoke with Dr. Volney American regarding verification of patient's blood pressure medications.  Medication list updated accordingly.  Dr. Burr Medico aware.

## 2017-09-24 NOTE — Telephone Encounter (Signed)
Scheduled appt per 9/6 los - gave patient aVS and calender per los.

## 2017-09-24 NOTE — Progress Notes (Signed)
Per Dr. Burr Medico okay to treat with current BP, please recheck if systolic is greater than 909 please let her know.

## 2017-09-29 ENCOUNTER — Other Ambulatory Visit: Payer: Medicare Other

## 2017-10-08 ENCOUNTER — Inpatient Hospital Stay: Payer: Medicare Other

## 2017-10-08 ENCOUNTER — Telehealth: Payer: Self-pay | Admitting: *Deleted

## 2017-10-08 VITALS — BP 139/83 | HR 83 | Temp 97.9°F | Resp 16

## 2017-10-08 DIAGNOSIS — B182 Chronic viral hepatitis C: Secondary | ICD-10-CM | POA: Diagnosis not present

## 2017-10-08 DIAGNOSIS — R530 Neoplastic (malignant) related fatigue: Secondary | ICD-10-CM | POA: Diagnosis not present

## 2017-10-08 DIAGNOSIS — C220 Liver cell carcinoma: Secondary | ICD-10-CM

## 2017-10-08 DIAGNOSIS — E46 Unspecified protein-calorie malnutrition: Secondary | ICD-10-CM | POA: Diagnosis not present

## 2017-10-08 DIAGNOSIS — Z5112 Encounter for antineoplastic immunotherapy: Secondary | ICD-10-CM | POA: Diagnosis not present

## 2017-10-08 DIAGNOSIS — K7031 Alcoholic cirrhosis of liver with ascites: Secondary | ICD-10-CM | POA: Diagnosis not present

## 2017-10-08 LAB — CBC WITH DIFFERENTIAL (CANCER CENTER ONLY)
BASOS PCT: 1 %
Basophils Absolute: 0 10*3/uL (ref 0.0–0.1)
Eosinophils Absolute: 0.2 10*3/uL (ref 0.0–0.5)
Eosinophils Relative: 4 %
HCT: 31.8 % — ABNORMAL LOW (ref 38.4–49.9)
HEMOGLOBIN: 10.6 g/dL — AB (ref 13.0–17.1)
Lymphocytes Relative: 31 %
Lymphs Abs: 1.8 10*3/uL (ref 0.9–3.3)
MCH: 30.1 pg (ref 27.2–33.4)
MCHC: 33.3 g/dL (ref 32.0–36.0)
MCV: 90.3 fL (ref 79.3–98.0)
MONOS PCT: 8 %
Monocytes Absolute: 0.4 10*3/uL (ref 0.1–0.9)
NEUTROS ABS: 3.2 10*3/uL (ref 1.5–6.5)
NEUTROS PCT: 56 %
Platelet Count: 145 10*3/uL (ref 140–400)
RBC: 3.52 MIL/uL — AB (ref 4.20–5.82)
RDW: 14.5 % (ref 11.0–14.6)
WBC Count: 5.7 10*3/uL (ref 4.0–10.3)

## 2017-10-08 LAB — CMP (CANCER CENTER ONLY)
ALBUMIN: 3.7 g/dL (ref 3.5–5.0)
ALT: 11 U/L (ref 0–44)
ANION GAP: 10 (ref 5–15)
AST: 25 U/L (ref 15–41)
Alkaline Phosphatase: 124 U/L (ref 38–126)
BUN: 15 mg/dL (ref 8–23)
CO2: 21 mmol/L — AB (ref 22–32)
Calcium: 8.9 mg/dL (ref 8.9–10.3)
Chloride: 110 mmol/L (ref 98–111)
Creatinine: 1.49 mg/dL — ABNORMAL HIGH (ref 0.61–1.24)
GFR, Est AFR Am: 55 mL/min — ABNORMAL LOW (ref >60)
GFR, Estimated: 47 mL/min — ABNORMAL LOW (ref >60)
Glucose, Bld: 95 mg/dL (ref 70–99)
POTASSIUM: 3.8 mmol/L (ref 3.5–5.1)
SODIUM: 141 mmol/L (ref 135–145)
Total Bilirubin: 0.3 mg/dL (ref 0.3–1.2)
Total Protein: 7.3 g/dL (ref 6.5–8.1)

## 2017-10-08 MED ORDER — HEPARIN SOD (PORK) LOCK FLUSH 100 UNIT/ML IV SOLN
500.0000 [IU] | Freq: Once | INTRAVENOUS | Status: AC | PRN
  Administered 2017-10-08: 500 [IU]
  Filled 2017-10-08: qty 5

## 2017-10-08 MED ORDER — SODIUM CHLORIDE 0.9 % IV SOLN
240.0000 mg | Freq: Once | INTRAVENOUS | Status: AC
  Administered 2017-10-08: 240 mg via INTRAVENOUS
  Filled 2017-10-08: qty 24

## 2017-10-08 MED ORDER — SODIUM CHLORIDE 0.9% FLUSH
10.0000 mL | INTRAVENOUS | Status: DC | PRN
  Administered 2017-10-08: 10 mL
  Filled 2017-10-08: qty 10

## 2017-10-08 MED ORDER — SODIUM CHLORIDE 0.9 % IV SOLN
Freq: Once | INTRAVENOUS | Status: AC
  Administered 2017-10-08: 09:00:00 via INTRAVENOUS
  Filled 2017-10-08: qty 250

## 2017-10-08 NOTE — Patient Instructions (Signed)
[facility] Discharge Instructions for Patients Receiving Chemotherapy  Today you received the following chemotherapy agents :  Opdivo.  To help prevent nausea and vomiting after your treatment, we encourage you to take your nausea medication as prescribed.   If you develop nausea and vomiting that is not controlled by your nausea medication, call the clinic.   BELOW ARE SYMPTOMS THAT SHOULD BE REPORTED IMMEDIATELY:  *FEVER GREATER THAN 100.5 F  *CHILLS WITH OR WITHOUT FEVER  NAUSEA AND VOMITING THAT IS NOT CONTROLLED WITH YOUR NAUSEA MEDICATION  *UNUSUAL SHORTNESS OF BREATH  *UNUSUAL BRUISING OR BLEEDING  TENDERNESS IN MOUTH AND THROAT WITH OR WITHOUT PRESENCE OF ULCERS  *URINARY PROBLEMS  *BOWEL PROBLEMS  UNUSUAL RASH Items with * indicate a potential emergency and should be followed up as soon as possible.  Feel free to call the clinic should you have any questions or concerns. The clinic phone number is [phone].  Please show the CHEMO ALERT CARD at check-in to the Emergency Department and triage nurse.   

## 2017-10-08 NOTE — Telephone Encounter (Signed)
Per MD ok to proceed with treatment per notification of elevated BP- pt did not take AM BP medications.  Give 0.1 mg Clonidine with treatment if needed.  Above called to [facility] in pharmacy.  Treatment nurse notified.

## 2017-10-19 ENCOUNTER — Telehealth: Payer: Self-pay | Admitting: *Deleted

## 2017-10-19 NOTE — Telephone Encounter (Signed)
Received vm call from pt stating that he is burning up, toes burnt, roof of mouth.  " I think I got the wrong chemo or too much, I can't eat, toes are blistered.  I have blister in roof of my mouth.  I've had diarrhea & can't sleep & getting worse.  This has been going on for 11 days."  Returned call & pt had not taken temp.  Asked him to do so.  Reported temp 98.  He is drinking some gatorade & ate this am & had chick fillet yest.  He has not vomited although says he would feel better if he did.  Instructed to take nausea med.  He states he thought he was only to take if vomiting.  Informed that this is for nausea too & should take as needed per script to help control nausea.  He reports diarrhea 2-3 x/day & has not taken imodium.  He has at home & will take.  He then says that the blister in roof of mouth is about gone.  Discussed baking soda/salt water rinse & Biotene.  He states he has these at home also & will try.  When questioned about feet, he says areas around feet red & some peeling & scabs b/t toes.  He reports that he has had this before with optivo.  Offered for pt to come in to see symptom management but he states he doesn't want to come in & wants to stick it out.  Informed to call back if no improvement or he changes his mind.

## 2017-10-19 NOTE — Progress Notes (Signed)
[facility]  [phone] [phone]  Clinic Follow up Note   Patient Care Team: Antonietta Jewel, MD as PCP - General (Internal Medicine) [name], Cristopher Estimable, MD as Attending Physician (Gastroenterology) Antonietta Jewel, MD as Referring Physician (Internal Medicine) Truitt Merle, MD as Consulting Physician (Hematology) [name], CRNP as Nurse Practitioner (Nurse Practitioner) Lynnda Shields, Frances Nickels, DO as Referring Physician (Osteopathic Medicine)   Date of Service:  10/22/2017   CHIEF COMPLAINTS:  Fatigue, poor appetite, diarrhea for 2 weeks   Oncology History   Cancer Staging Hepatocellular carcinoma [facility]) Staging form: Liver (Excluding Intrahepatic Bile Ducts), AJCC 7th Edition - Clinical stage from 04/16/2012: Stage II (T2(m), N0, M0) - Signed by Truitt Merle, MD on 10/12/2015       Hepatocellular carcinoma ([place])   09/14/2012 Tumor Marker    AFP 28.9    04/16/2013 Imaging    Abdominal MRI with and without contrast reviewed multifocal (4) liver lesions in both left and right lobes, most consistent with [place], largest measuring 2.7 cm    04/16/2013 Initial Diagnosis    Hepatocellular carcinoma ([place])    04/28/2013 Procedure    Right TACE with lipiodol     06/15/2013 Procedure    Left TACE with [facility]     07/14/2013 Procedure    Right TACE with [facility]    09/05/2015 Imaging    CT abdomen with and without contrast showed a new 5.0 x 2.3 cm mass in the hepatic segment 8, invading and occluding the right anterior portal vein, most compatible with [place]. A portacaval node measuring 3.0 x 1.4 cm, previously 2.9 x 1.1 cm.    10/11/2015 Tumor Marker    AFP 5.3    11/11/2015 - 02/03/2016 Chemotherapy    sorafenib 200mg  bid started on 11/11/2015, increased to 400mg  bid in 2 weeks, stopped due to poor tolerance and probable disease progression     11/14/2015 Imaging    CT CAP 11/14/2015 IMPRESSION: Chest Impression: 1. RIGHT upper lobe pulmonary nodule is indeterminate.  No comparison available. 2. Ground-glass opacity and peripheral nodules in the RIGHT middle lobe appear post infectious or inflammatory.  Abdomen / Pelvis Impression: 1. 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 Thrombus also likely within the LEFT and RIGHT portal vein. Difficult to distinguish tumor thrombus versus bland thrombus. 2. Individual lesions are difficult to define in the RIGHT hepatic lobe. There is overall impression of progression of disease in the RIGHT hepatic lobe with multiple ill-defined enhancing lesions. 3. Periportal and shotty retroperitoneal adenopathy similar to prior.    01/25/2016 Imaging    CT Abdomen Pelvis w/ Contrast IMPRESSION: 1. Findings consistent with multifocal liver carcinoma with extensive portal vein thrombosis, and subsequent development of porta hepatis venous collaterals. Thrombus in the central portal vein is no longer expansile, but still expands the Dillon peripheral portal vein and the right and left portal veins. There are few right liver bile ducts there are now dilated, which suggests mild progression of liver disease. 2. There is mild splenomegaly consistent or venous hypertension, which has mildly increased from the prior CT. 3. There are new lung abnormalities with interstitial thickening and ill-defined peribronchovascular nodular opacities mostly at the right lung base with a smaller area noted along the medial left lower lobe. The etiology may be infectious. It may be inflammatory, possibly related to chemotherapy. Neoplastic disease is also possible. 4. There is no other evidence  of metastatic disease within the abdomen or pelvis. No acute findings within the abdomen or pelvis    01/25/2016 - 01/25/2016 Hospital Admission    Pt was seen in ED for worsening abdominal pain, treated with pain meds     02/04/2016 -  Chemotherapy     Nivolumab 240mg  every 2 weeks, started on 02/04/2016; on 04/30/16 switched to every 3 weeks due to fatigue; change from 240mg  to 480mg  every 4 weeks starting 09/03/2016. Given his poor toleration, will reduce him to every 2 weeks at 240mg  starting 01/02/17. Changed to every 3 weeks on 04/09/17, and changed back to every 2 weeks on 06/11/2017. Postponed due to Suboxone reaction.       02/14/2016 Imaging    CT CAP w/ contrast 1. Slight interval increase in size of the mediastinal lymph nodes. 2. Persistent extensive lower lobe peribronchial thickening and patchy infiltrates. Possible aspiration pneumonia. 3. Persistent tree-in-bud appearance in the right middle lobe, likely chronic inflammation or atypical infection. 4. Improved CT appearance of the liver. Two small residual arterial phase enhancing lesions are identified. 5. Chronic portal vein thrombosis with cavernous transformation of portal venous collaterals. Esophageal varices are again noted. 6. Stable upper abdominal lymph nodes likely related to cirrhosis.     04/16/2016 Tumor Marker    AFP 4.1     04/17/2016 Imaging     CT CAP W PELVIS IMPRESSION: 1. Stable mild mediastinal lymphadenopathy. 2. Interval improvement in the bibasilar peribronchial thickening, airway impaction, and peribronchovascular nodularity. Imaging features suggest marked improvement in bilateral low the atypical infection. Changes in the right middle lobe with Dillon stable and may represent scarring from previous infectious/inflammatory etiology. 3. The small hypervascular lesions seen in the anterior left liver on the previous study are not evident today. There is some subtle, ill-defined hyperenhancement in the central left liver which is nonspecific. Attention to this area on followup imaging recommended. 4. Chronic portal vein inclusion with cavernous transformation in the porta hepatis and paraesophageal varices. 5. Stable appearance hepatoduodenal and  retroperitoneal lymphadenopathy, likely chronic and related to liver disease.    04/17/2016 Imaging    CT CAP CONTRAST IMPRESSION: 1. Stable mild mediastinal lymphadenopathy. 2. Interval improvement in the bibasilar peribronchial thickening, airway impaction, and peribronchovascular nodularity. Imaging features suggest marked improvement in bilateral low the atypical infection. Changes in the right middle lobe with Dillon stable and may represent scarring from previous infectious/inflammatory etiology. 3. The small hypervascular lesions seen in the anterior left liver on the previous study are not evident today. There is some subtle, ill-defined hyperenhancement in the central left liver which is nonspecific. Attention to this area on followup imaging recommended. 4. Chronic portal vein inclusion with cavernous transformation in the porta hepatis and paraesophageal varices. 5. Stable appearance hepatoduodenal and retroperitoneal lymphadenopathy, likely chronic and related to liver disease.    05/21/2016 Imaging    MR Abdomen W WO Contrast IMPRESSION: 1. Limited motion degraded scan. Cirrhosis. No evidence of a liver mass within these limitations. 2. Mild splenomegaly. No ascites. Stable mild paraumbilical and gastroesophageal varices. 3. Stable nonspecific mild retroperitoneal lymphadenopathy. 4. 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    06/04/2016 Tumor Marker    AFP 3.5     08/25/2016 Imaging    MR Abdomen w wo contrast  IMPRESSION: 1. Mild-to-moderate motion degradation, preferentially involving the pre and postcontrast dynamic images. On follow-up, given the extent of motion on this exam, multiphase CT may be preferred. 2.  Given these limitations, no evidence of hepatocellular carcinoma. 3. Marked cirrhosis and portal venous hypertension. 4. Slight increase in abdominal adenopathy, which is most likely reactive. Recommend  attention on follow-up. 5. Cholelithiasis.     11/02/2016 Procedure    Upper endoscopy and colonoscopy  Diagnosis 1. Stomach, biopsy - MILD CHRONIC GASTRITIS. - NEGATIVE FOR HELICOBACTER PYLORI. - NO INTESTINAL METAPLASIA, DYSPLASIA, OR MALIGNANCY. 2. Colon, polyp(s), opposite ileocecal valve - TUBULAR ADENOMA. - NO HIGH GRADE DYSPLASIA OR MALIGNANCY. 3. Rectum, polyp(s) - HYPERPLASTIC POLYP. - NO DYSPLASIA OR MALIGNANCY.    01/08/2017 Imaging    CT Chest WO Contrast 01/08/17  IMPRESSION: 1. Stable chest CT. 2. Prominent to mildly enlarged mediastinal lymph nodes are stable. Based on stability, these are favored to be reactive. 3. Stable emphysema and right upper lobe nodularity. 4. Mild atherosclerosis.    01/08/2017 Imaging    MRI Abd W WO Contrast 01/08/17  IMPRESSION: 1. Cirrhotic liver now with severe iron deposition throughout the hepatic parenchyma, as well as areas of developing confluent hepatic fibrosis. Today's study clearly demonstrates 2 new hypervascular nodules in segment 4A of the liver, which demonstrate washout but no definite pseudocapsule, the largest which measures 1.5 cm. At this time, these are considered suspicious for hepatocellular carcinoma, bladder classified as Li-RADS 3 lesions. 2. 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 3. Cholelithiasis without evidence of acute cholecystitis at this time. 4. Mild splenomegaly.    03/08/2017 Imaging    MRI Abdomen W WO Contrast 03/08/17 IMPRESSION: 1. 15 mm hypervascular lesion identified on the previous study is stable in the interval measuring 14 mm today. A lesion of this size demonstrating arterial phase hypervascularity and washout is consistent with LI-RADS 4, suggestive of but not diagnostic for hepatoma. Follow-up MRI in 3 months may be warranted. 2. 9 mm hypervascular lesion identified in segment IV on the previous study is not identified today.  Attention on follow-up is recommended. 3. 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 4. Cholelithiasis.    03/27/2017 Imaging    MRI Abdomen W WO Contrast 09/21/17 IMPRESSION: 1. Similar size but increased visualization/distinctness of a segment 4A lesion. This is most consistent with LI-RADS 3 lesion. No new liver lesions identified. 2. Cirrhosis with portal venous hypertension and cavernous transformation of the portal vein. 3. Motion degradation. 4.  Aortic Atherosclerosis (ICD10-I70.0). 5. Cholelithiasis.    03/27/2017 Imaging    CT Chest W contrast 09/21/17 IMPRESSION: 1. Mild mediastinal and right hilar adenopathy is stable and nonspecific. 2. Clustered nodularity in the posterior right upper lobe is stable. No new or progressive pulmonary nodularity. Suggest continued chest CT surveillance. 3. Patchy tree-in-bud opacities in the right middle lobe and bilateral lower lobes, stable. New patchy peribronchovascular ground-glass opacity in the left lower lobe. Findings suggest acute on chronic bronchiolitis/pneumonitis, such as due to recurrent aspiration. 4. 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 Please see the separate concurrent MRI abdomen report for the findings in the upper abdomen.    06/01/2017 Imaging    CT CAP w contrast IMPRESSION: 1. Stable size of the dominant arterial phase enhancing lesion in segment 4a of the liver, currently measuring 1.4 by 1.0 cm. There is a questionable second focus measuring 0.7 by 0.4 cm in segment 2 of the liver. Both merit surveillance. 2. A porta hepatis lymph node has enlarged compared to the prior MRI, previously 1.2  cm and currently 2.0 cm. Although potentially reactive, a malignant lymph node could also demonstrate such size increase. This likewise merits careful surveillance. Otherwise is borderline  adenopathy in the chest and abdomen. 3. Mild reduced nodularity associated with a right apical bandlike density, this appearance likewise merits surveillance. 4. Other imaging findings of potential clinical significance: Aortic Atherosclerosis (ICD10-I70.0). Emphysema (ICD10-J43.9). Airway thickening is present, suggesting bronchitis or reactive airways disease. Contracted gallbladder with small gallstones. Cavernous transformation of the portal vein with recanalized umbilical vein indicating portal venous hypertension. Cirrhosis. Capsular calcifications along the glenohumeral and hip joints.    09/21/2017 Imaging    CT Chest W contrast 09/21/17 IMPRESSION: 1. Mild mediastinal and right hilar adenopathy is stable and nonspecific. 2. Clustered nodularity in the posterior right upper lobe is stable. No new or progressive pulmonary nodularity. Suggest continued chest CT surveillance. 3. Patchy tree-in-bud opacities in the right middle lobe and bilateral lower lobes, stable. New patchy peribronchovascular ground-glass opacity in the left lower lobe. Findings suggest acute on chronic bronchiolitis/pneumonitis, such as due to recurrent aspiration. 4. 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 Please see the separate concurrent MRI abdomen report for the findings in the upper abdomen.    09/21/2017 Imaging     MRI Abdomen W WO Contrast 09/21/17 IMPRESSION: 1. Similar size but increased visualization/distinctness of a segment 4A lesion. This is most consistent with LI-RADS 3 lesion. No new liver lesions identified. 2. Cirrhosis with portal venous hypertension and cavernous transformation of the portal vein. 3. Motion degradation. 4.  Aortic Atherosclerosis (ICD10-I70.0). 5. Cholelithiasis.     HISTORY OF PRESENTING ILLNESS (10/11/2015):  Kevin Dillon 67 y.o. male with past medical history of treated hepatitis C, liver cirrhosis, and hepatocellular  carcinoma is here because of recurrent hepatocellular carcinoma. He is accompanied by his significant other Kevin Dillon and her sister.  He was diagnosed with hepatocellular carcinoma in 2015, his initial image result is not available and staging is unknown. He was seen by interventional radiologist Dr. Delice Lesch at [facility] in [place] and underwent TACE procedure 3 times in 2015. He was subsequently followed, and recently repeated CT scan showed a new 5.0 cm mass in segment 8, with portal vein invasion. He was felt not to be a candidate for further liver targeted therapy, and was referred to [place] for further systemic therapy.  He complains about mid epigastric pain for the past 2 years, which significantly improved after his TACE procedure in 2015. It has been getting worse lately in the past 6 months. He states he gets about 7-8 out of 10, persistent, he has been taking oxycodone 20 mg every 6 hours, but his pain is not well controlled. He is quite fatigued, is low appetite. He last 40 lbs in the pat 4 months. He is able to function at home, in trying to remain to be physically active.  His hepatitis C was successfully treated. He has history of hepatitis C, complicated with ascites and encephalopathy, but it has been well controlled with medical management.   CURRENT THERAPY:   Nivolumab 240mg  every 2 weeks, started on 02/04/2016; on 04/30/16 switched to every 3 weeks due to fatigue; change from 240mg  to 480mg  every 4 weeks starting 09/03/2016. Given his poor toleration, reduced back to every 2 weeks at 240mg  starting 01/02/17. Changed to every 3 weeks on 04/09/17, and changed back to every 2 weeks on 06/11/2017, but was non-complaint due to his pain management issue, now under control  and back on q2week schedule.     INTERIM HISTORY:   Kevin Dillon is a 67 y.o. male who returns for follow up of recurrent liver cancer, ongoing treatment. He has been feeling poorly since his last treatment of nivolumab 2  weeks ago.  He has been quite fatigued, with low appetite, diarrhea with watery bowel movement 3-5 times a day, and intermittent sweating.  She has not been able to do much at home.  He did not call on to early this week, my nurse suggested to him coming to see our symptom management clinic, she declined.  He did not show up for his appointment this morning, my nurse called, he again declined coming in.  I called him, since he has been off oxycodone about 2 weeks ago, his symptom is suspicious for opiate withdrawal.  I discussed with his future pain specialist Dr. Maryjean Ka, who is scheduled to see him next Wednesday.  We agreed to give him a short course of oxycodone, and patient agreed to come in.    MEDICAL HISTORY:  Past Medical History:  Diagnosis Date  . Cancer ([place])    liver  . Cholelithiasis   . Chronic hepatitis C ([place])   . Chronic knee pain   . Chronic left shoulder pain   . CVA (cerebral infarction)   . Gout   . Hepatitis C    genotype 1b.  pt has been vaccinated against hep A and B.  . Hypertension   . Osteoarthritis   . Schatzki's ring   . Tubular adenoma of colon 12/2011    SURGICAL HISTORY: Past Surgical History:  Procedure Laterality Date  . AMPUTATION Left 09/17/2012   Procedure: SMALL FINGER EXTENSOR TENDON REPAIR; METACARPAL LEVEL AMPUTATION RING FINGER; PROXIMAL PHALANX LEVEL AMPUTATION LONG FINGER;  Surgeon: Schuyler Amor, MD;  Location: [facility];  Service: Orthopedics;  Laterality: Left;  . Arm surgery     right/plate in arm  . BIOPSY  11/02/2016   Procedure: BIOPSY;  Surgeon: Daneil Dolin, MD;  [facility];  Service: Endoscopy;;  gastric   . COLONOSCOPY WITH ESOPHAGOGASTRODUODENOSCOPY (EGD)  12/30/2011   RMR: Noncritical Schatzki's ring;  Hiatal hernia, Tubular ADENOMA removed from splenic flexure, otherwise normal colonoscopy  . COLONOSCOPY WITH PROPOFOL N/A 11/02/2016   Procedure: COLONOSCOPY WITH PROPOFOL;  Surgeon: Daneil Dolin, MD;   [facility];  Service: Endoscopy;  Laterality: N/A;  1045  . ESOPHAGOGASTRODUODENOSCOPY (EGD) WITH PROPOFOL N/A 11/02/2016   Procedure: ESOPHAGOGASTRODUODENOSCOPY (EGD) WITH PROPOFOL;  Surgeon: Daneil Dolin, MD;  [facility];  Service: Endoscopy;  Laterality: N/A;  . IR FLUORO GUIDE PORT INSERTION RIGHT  10/21/2016  . IR US GUIDE VASC ACCESS RIGHT  10/21/2016  . LEG SURGERY     left  . POLYPECTOMY  11/02/2016   Procedure: POLYPECTOMY;  Surgeon: Daneil Dolin, MD;  [facility];  Service: Endoscopy;;  colon  . WOUND EXPLORATION Left 09/17/2012   Procedure: WOUND EXPLORATION;  Surgeon: Schuyler Amor, MD;  Location: [facility];  Service: Orthopedics;  Laterality: Left;    SOCIAL HISTORY: Social History   Socioeconomic History  . Marital status: Significant Other    Spouse name: Not on file  . Number of children: 0  . Years of education: Not on file  . Highest education level: Not on file  Occupational History  . Occupation: disabled  Social Needs  . Financial resource strain: Not on file  . Food  insecurity:    Worry: Not on file    Inability: Not on file  . Transportation needs:    Medical: Not on file    Non-medical: Not on file  Tobacco Use  . Smoking status: Former Smoker    Packs/day: 0.50    Years: 40.00    Pack years: 20.00    Types: Cigarettes    Last attempt to quit: 01/19/2012    Years since quitting: 5.7  . Smokeless tobacco: Never Used  . Tobacco comment: Smokes 1/2 pack of cigarettes daily  Substance and Sexual Activity  . Alcohol use: No    Comment: HX 2 beers 3-4 days per week; QUIT DEC 2013  . Drug use: No    Comment: Hx cocaine yrs ago, Last marijuana OCT 2013  . Sexual activity: Not Currently    Partners: Female  Lifestyle  . Physical activity:    Days per week: Not on file    Minutes per session: Not on file  . Stress: Not on file  Relationships  . Social connections:    Talks on phone: Not on file    Gets  together: Not on file    Attends religious service: Not on file    Active member of club or organization: Not on file    Attends meetings of clubs or organizations: Not on file    Relationship status: Not on file  . Intimate partner violence:    Fear of current or ex partner: Not on file    Emotionally abused: Not on file    Physically abused: Not on file    Forced sexual activity: Not on file  Other Topics Concern  . Not on file  Social History Narrative   Lives w/ significant other, Kevin Dillon    FAMILY HISTORY: Family History  Problem Relation Age of Onset  . Cirrhosis Father 72  . Cancer Father        liver cancer   . Lung cancer Mother 49  . Colon cancer Neg Hx     ALLERGIES:  has No Known Allergies.  MEDICATIONS:  Current Outpatient Medications  Medication Sig Dispense Refill  . ALPRAZolam (XANAX) 1 MG tablet Take 1 tablet (1 mg total) by mouth 2 (two) times daily as needed for anxiety. 60 tablet 0  . furosemide (LASIX) 20 MG tablet Take 20 mg by mouth daily.    . hydrALAZINE (APRESOLINE) 25 MG tablet Take by mouth daily.     Marland Kitchen lactulose (CHRONULAC) 10 GM/15ML solution Take 30 g by mouth daily as needed for moderate constipation.     . lidocaine-prilocaine (EMLA) cream Apply 1 application topically as needed. Apply to portacath 1 1/2 hours - 2 hours prior to procedures as needed. 30 g 1  . megestrol (MEGACE) 40 MG/ML suspension Take 400 mg by mouth daily.     . metoprolol tartrate (LOPRESSOR) 100 MG tablet Take 100 mg by mouth every morning.    . mirtazapine (REMERON) 7.5 MG tablet Take 1 tablet (7.5 mg total) by mouth at bedtime. 30 tablet 1  . Multiple Vitamins-Minerals (CENTRUM SILVER PO) Take 1 tablet by mouth daily.     . Oxycodone HCl 10 MG TABS Take 1-1.5 tablets (10-15 mg total) by mouth every 6 (six) hours for 7 days. 40 tablet 0  . potassium chloride SA (K-DUR,KLOR-CON) 20 MEQ tablet Take 1 tablet (20 mEq total) by mouth daily. Take 20 meq by mouth daily  for 7 days. 30 tablet 1  .  prochlorperazine (COMPAZINE) 10 MG tablet TAKE 1 TABLET BY MOUTH EVERY 8 HOURS AS NEEDED FOR NAUSEA/VOMITING 30 tablet 1  . rivaroxaban (XARELTO) 20 MG TABS tablet TAKE 1 TABLET BY MOUTH DAILY WITH SUPPER. 30 tablet 3   No current facility-administered medications for this visit.     REVIEW OF SYSTEMS:  Constitutional: Denies fevers, chills or abnormal night sweats  (+) weight stable, (+) fatigue Eyes: Denies blurriness of vision, double vision or watery eyes Ears, nose, mouth, throat, and face: Denies mucositis or sore throat  Respiratory: Denies cough, dyspnea or wheezes Cardiovascular: Denies palpitation, chest discomfort or lower extremity swelling Gastrointestinal: (+) Poor appetite, intermittent nausea, diarrhea, and abdominal pain Skin: Denies abnormal skin rashes Lymphatics: Denies new lymphadenopathy or easy bruising Neurological:Denies numbness, tingling or new weaknesses Behavioral/Psych: normal All other systems were reviewed with the patient and are negative.  PHYSICAL EXAMINATION  ECOG PERFORMANCE STATUS: 3 Blood pressure 126/60, heart rate 94, respirate 19, temperature 36.4, pulse ox 98% on room air GENERAL:alert, no distress and comfortable SKIN: skin color, texture, turgor are normal, no rashes or significant lesions EYES: normal, conjunctiva are pink and non-injected, sclera clear OROPHARYNX:no exudate, no erythema and lips, buccal mucosa, and tongue normal  NECK: supple, thyroid normal size, non-tender, without nodularity LYMPH:  no palpable lymphadenopathy in the cervical, axillary or inguinal LUNGS: clear to auscultation and percussion with normal breathing effort HEART: regular rate & rhythm and no murmurs and no lower extremity edema ABDOMEN:(+) abdominal bloating with mild RUQ tenderness Musculoskeletal:no cyanosis of digits and no clubbing (+) bilateral lower limb pain PSYCH: alert & oriented x 3 with fluent speech NEURO: no focal  motor/sensory deficits  LABORATORY DATA:  I have reviewed the data as listed CBC Latest Ref Rng & Units 10/22/2017 10/08/2017 09/21/2017  WBC 4.0 - 10.3 K/uL 8.2 5.7 8.2  Hemoglobin 13.0 - 17.1 g/dL 12.5(L) 10.6(L) 11.4(L)  Hematocrit 38.4 - 49.9 % 36.8(L) 31.8(L) 33.3(L)  Platelets 140 - 400 K/uL 197 145 192   CMP Latest Ref Rng & Units 10/22/2017 10/08/2017 09/21/2017  Glucose 70 - 99 mg/dL 94 95 86  BUN 8 - 23 mg/dL 15 15 15   Creatinine 0.61 - 1.24 mg/dL 1.42(H) 1.49(H) 1.36(H)  Sodium 135 - 145 mmol/L 139 141 138  Potassium 3.5 - 5.1 mmol/L 3.7 3.8 3.9  Chloride 98 - 111 mmol/L 108 110 109  CO2 22 - 32 mmol/L 18(L) 21(L) 18(L)  Calcium 8.9 - 10.3 mg/dL 9.5 8.9 9.4  Total Protein 6.5 - 8.1 g/dL 8.2(H) 7.3 7.8  Total Bilirubin 0.3 - 1.2 mg/dL 0.8 0.3 0.5  Alkaline Phos 38 - 126 U/L 154(H) 124 96  AST 15 - 41 U/L 40 25 23  ALT 0 - 44 U/L 21 11 14     AFP:  09/14/2012: 28.9 10/11/2015: 5.3 11/01/2015: 3.7 12/02/2015: 3.7 01/22/2016: 3.4 02/18/2016: 7.1 03/17/16: 5.8 04/16/16: 4.1 06/04/2016: 3.5 07/16/2016:4.4 08/27/2016: 5.6 10/08/16: 4.3 11/05/16: 4.8 12/03/16: 5.4 01/01/17: 5.1 01/29/17: 4.5 02/26/17: 5.5 04/09/17: 4.0 04/30/17: 3.3 06/11/17: 4.0 07/06/17: 4.4 08/27/17: 3.8 09/21/17: 4.8   PATHOLOGY   Diagnosis 11/02/16  1. Stomach, biopsy - MILD CHRONIC GASTRITIS. - NEGATIVE FOR HELICOBACTER PYLORI. - NO INTESTINAL METAPLASIA, DYSPLASIA, OR MALIGNANCY. 2. Colon, polyp(s), opposite ileocecal valve - TUBULAR ADENOMA. - NO HIGH GRADE DYSPLASIA OR MALIGNANCY. 3. Rectum, polyp(s) - HYPERPLASTIC POLYP. - NO DYSPLASIA OR MALIGNANCY. Microscopic Comment 1. A Warthin-Starry stain is performed to determine the possibility of the presence of Helicobacter pylori. The Warthin-Starry stain is negative  for organisms of Helicobacter pylori.   PROCEDURE  Upper Endoscopy by Dr. [name] 11/02/16  IMPRESSION:  - mildly obstructing Schatzki ring. - dilated with scope passage. erosive reflux  esophagitis - Small hiatal hernia. - Portal hypertensive gastropathy. biopsied - Normal duodenal bulb and second portion of the duodenum. Biopsied.  Colonoscopy by Dr. [name] 11/02/16  IMPRESSION:  - Two 3 to 5 mm polyps in the rectum and at the ileocecal valve, removed with a cold snare. Resected and retrieved. - Diverticulosis in the sigmoid colon and in the descending colon. - The examination was otherwise normal on direct and retroflexion views.   RADIOGRAPHIC STUDIES: I have personally reviewed the radiological images as listed and agreed with the findings in the report.  CT Chest W contrast 09/21/17 IMPRESSION: 1. Mild mediastinal and right hilar adenopathy is stable and nonspecific. 2. Clustered nodularity in the posterior right upper lobe is stable. No new or progressive pulmonary nodularity. Suggest continued chest CT surveillance. 3. Patchy tree-in-bud opacities in the right middle lobe and bilateral lower lobes, stable. New patchy peribronchovascular ground-glass opacity in the left lower lobe. Findings suggest acute on chronic bronchiolitis/pneumonitis, such as due to recurrent aspiration. 4. 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 Please see the separate concurrent MRI abdomen report for the findings in the upper abdomen.   MRI Abdomen W WO Contrast 09/21/17 IMPRESSION: 1. Similar size but increased visualization/distinctness of a segment 4A lesion. This is most consistent with LI-RADS 3 lesion. No new liver lesions identified. 2. Cirrhosis with portal venous hypertension and cavernous transformation of the portal vein. 3. Motion degradation. 4.  Aortic Atherosclerosis (ICD10-I70.0). 5. Cholelithiasis.  04/17/16 CT CAP W PELVIS IMPRESSION: 1. Stable mild mediastinal lymphadenopathy. 2. Interval improvement in the bibasilar peribronchial thickening, airway impaction, and peribronchovascular nodularity. Imaging features  suggest marked improvement in bilateral low the atypical infection. Changes in the right middle lobe with Dillon stable and may represent scarring from previous infectious/inflammatory etiology. 3. The small hypervascular lesions seen in the anterior left liver on the previous study are not evident today. There is some subtle, ill-defined hyperenhancement in the central left liver which is nonspecific. Attention to this area on followup imaging recommended. 4. Chronic portal vein inclusion with cavernous transformation in the porta hepatis and paraesophageal varices. 5. Stable appearance hepatoduodenal and retroperitoneal lymphadenopathy, likely chronic and related to liver disease.   MR Abdomen W WO Contrast 05/21/16 IMPRESSION: 1. Limited motion degraded scan. Cirrhosis. No evidence of a liver mass within these limitations. 2. Mild splenomegaly. No ascites. Stable mild paraumbilical and gastroesophageal varices. 3. Stable nonspecific mild retroperitoneal lymphadenopathy. 4. 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  MR Abdomen w wo contrast 08/25/2016 IMPRESSION: 1. Mild-to-moderate motion degradation, preferentially involving the pre and postcontrast dynamic images. On follow-up, given the extent of motion on this exam, multiphase CT may be preferred. 2. Given these limitations, no evidence of hepatocellular carcinoma. 3. Marked cirrhosis and portal venous hypertension. 4. Slight increase in abdominal adenopathy, which is most likely reactive. Recommend attention on follow-up. 5. Cholelithiasis.  CT Chest WO Contrast 01/08/17  IMPRESSION: 1. Stable chest CT. 2. Prominent to mildly enlarged mediastinal lymph nodes are stable. Based on stability, these are favored to be reactive. 3. Stable emphysema and right upper lobe nodularity. 4. Mild atherosclerosis.   MRI Abd W WO Contrast 01/08/17  IMPRESSION: 1. Cirrhotic liver now  with severe iron deposition throughout the hepatic parenchyma, as well  as areas of developing confluent hepatic fibrosis. Today's study clearly demonstrates 2 new hypervascular nodules in segment 4A of the liver, which demonstrate washout but no definite pseudocapsule, the largest which measures 1.5 cm. At this time, these are considered suspicious for hepatocellular carcinoma, bladder classified as Li-RADS 3 lesions. 2. 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 3. Cholelithiasis without evidence of acute cholecystitis at this time. 4. Mild splenomegaly.  MRI Abdomen W WO Contrast 03/08/17 IMPRESSION: 1. 15 mm hypervascular lesion identified on the previous study is stable in the interval measuring 14 mm today. A lesion of this size demonstrating arterial phase hypervascularity and washout is consistent with LI-RADS 4, suggestive of but not diagnostic for hepatoma. Follow-up MRI in 3 months may be warranted. 2. 9 mm hypervascular lesion identified in segment IV on the previous study is not identified today. Attention on follow-up is recommended. 3. 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 4. Cholelithiasis.  ASSESSMENT & PLAN:  GAGAN DILLION is a 67 y.o. Caucasian male with past medical history of successfully treated hepatitis C, liver cirrhosis, history of ascites and encephalopathy, recurrent HCC   1. Recurrent HCC, initially stage II, recurrent in 2017  -I previously reviewed his previous image and outside medical records extensively, confirmed key findings: He initially had a multifocal disease, status post TACE 3 times in 2015 -He previously developed symptomatic local recurrence in 08/2015, with a 5cm new lesion in segment 8 with direct invasion into portal vein, and a portocaval lymph node. He is not a candidate for liver targeted therapy per his IR  Dr. Delice Lesch -He was started on first line sorafenib, tolerated poorly. His previous CT abdomen and pelvis on 01/25/2016 revealed extensive portal hypertension, and a probable cancer progression in the liver. -His treatment was changed to immunotherapy Nivolumab on 02/04/16, he tolerated very well, and clinically he was doing much better, with less pain and better energy.  -I previously discussed his restaging CT scan which was done on 02/14/2016, it actually showed decreased liver cancer size compared to the scan a month ago. No other new metastasis. -restaging CT from 04/17/2016 showed no visible liver mass, but the imaging was very limited due to his liver cirrhosis  -I previously reviewed his recent abdominal MRI from 05/21/2016, which showed no visible mass in the liver. He has had complete radiographic response. -I previously reviewed his restaging MRI from 08/25/2016, which showed no visible liver mass, but the images quality with significant compromised due to the motion.  -Nivo has previously been changed to 480mg  every 4 weeks in 08/2016. Due to poor toleration (fatigue) I changed his nivo back to 240mg  every 2 weeks starting 01/02/17.  -We previously discussed his MRI abd and CT chest from 01/08/17 which showed no concern for metastatic disease in the chest. MRI shows two small new lesions in his liver. This was reviewed in tumor board, felt to be indeterminate and close f/u with imaging was recommended -MRI Abdomen from 03/08/17 reveals that his 15 mm hypervascular lesion identified on the previous study is stable in the interval measuring 14 mm, demonstrating arterial phase hypervascularity and washout is consistent with LI-RADS 4, suggestive of but not diagnostic for hepatoma, 9 mm hypervascular lesion identified in segment IV on the previous study is not identified today. I previously discussed results with pt. I recommended continue Nivolumab. -On 04/09/17, he requested to change treatment to every  3  weeks at 240 mg dose due to the fatigue. He knows the standard is every 2 weeks, but I will accommodated his request. -CT CAP w contrast on 06/01/2017 showed: Stable size of the dominant arterial phase enhancing lesion in segment 4a of the liver, currently measuring 1.4 by 1.0 cm. There is a questionable second focus measuring 0.7 by 0.4 cm in segment 2 of the liver. Both merit surveillance. A portal hepatis lymph node has enlarged compared to the prior MRI, previously 1.2 cm and currently 2.0 cm. Although potentially reactive, a malignant lymph node could also demonstrate such size increase. This likewise merits careful surveillance. Otherwise is borderline adenopathy in the chest and abdomen. Mild reduced nodularity associated with a right apical bandlike density, this appearance likewise merits surveillance. -Due to the probably slow disease progression, I previously recommended pt to have nivolumab q 2 weeks instead of  q 3 weeks on 06/11/17. He agreed.  -We previously reviewed his case in our GI tumor board, the consensus is continue to Nivolumab, given no significant disease progression on image. -Since he presented to the ED on 06/18/17 due to reaction to Suboxone he skipped a few Nivo treatment, he did restart on 07/06/17, but noncompliant, due to " not feeling well" since we cut his narcotics. -I previously recommended he sees pain specialist Dr. Lanetta Inch, at first he rejected the referral, but now has agreed to meet with him in 10/2017.  -We previously discussed his CT Chest and MRI abdomen from 09/21/17 which shows stable lesions in liver which is LI-RADS 3 lesion and stable pulmonary nodularity, no evidence of disease progression  -I have restarted him on nivolumab after he recovered from the Suboxone episode.  -He has been feeling poorly since last treatment 2 weeks ago, possible related to narcotic withdrawal, will hold treatment today.  He is going to see pain specialist Dr. Cyril Mourning next  Wednesday. -plan to restart nivo in 2 weeks   2. Abdominal pain, low back pain -His abdominal pain previously improved since he started treatment. With oxycodone and OxyContin, he was on very high dose and overall pain was controlled  -I previously prescribed flexeril in attempt to help with his pain. Due to his high tolerance, he can take up to 2 a day. -His prior constipation was likely related to his narcotic use, he uses colace and I previously advised him he can use Miralax or Senokot that is stronger.  -I previously had a lengthy conversation with pt about reducing his narcotic pain medication and the risks associated with taking high dose (including shortening his life) for a long period of time. His cancer is well controlled currently and it should not cause him a lot of pain. I am concerned that he has narcotic addition, and his depression/anxiety may also contribute to his pain. He is reluctant to reducing his dose. I offered Cymbalta on 06/11/17 and he agreed to try. Our social worker Webb Silversmith will see him same day and I previously recommended that he see psychiatrist someone to help manage his depression/anxiety/addiction.  -He was previously prescribed Suboxone on 06/17/17 by Dr. Rachael Fee. This reacted to Suboxone and had to go to ED the next day. He last took Suboxone on 06/21/17 7:00am. I explained Suboxone removed prior high tolerance to pain medication. -I previously discussed his pain management will likely be long term at this point. I discussed his options in great detail with him and his wife. He can return to Dr. Rachael Fee who  plans to change his Suboxone to Subotex to prevent another reaction. Pt declined firmly. His other option is for me to send a referral to another pain specialist. He declined at this point -I previously agreed to write his pain meds, but started him on very low-dose of oxycodone 5 mg every 6 hours as needed, then slowly increased him to oxycodone 10mg  then  15mg  every 6 hours, I do not plan to increase the dose unless he has disease progression or injury. I also discussed the option of trying Cymbalta again as this is not addictive. He notes this made him nauseous before but is willing to try.  -I previously discussed we need to find his root cased of pain as this is not solely cancer related pain. I explained I would like him to get treatment for his depression, and possible addiction, to help improve his overall feeling. He agreed to see. Dr. Luan Pulling in 10/27/2017. I will not refill any of his narcotics in the future.  -He currently has no pain and has is weaning himself off oxycodone in mid Sep. -Patient has developed fatigue, poor appetite, intermittent sweating, diarrhea, since he came off oxycodone, suspicious for oxygen withdrawal.  I will give him IV fluids 1 L with Zofran and asked today, I will call you in oxycodone 10 mg, 1 to 1.5 tablets every 6 hours as needed for his symptom and pain, a total of 40 tablets.  I do not plan to refill since he is going to see Dr. Maryjean Ka next Wednesday.   3. Portal vein thrombosis -continue Xarelto  -We previously discussed the moderate to high risk of bleeding with Xarelto due to his liver cirrhosis and he knows to avoid injury and fall  -He developed anemia again with no GI bleeding, so we stopped Xarelto  4 Liver cirrhosis secondary to hepatitis C and alcohol, history of encephalopathy and ascites -He will continue follow-up with [facility], his ascites has been well controlled with diuretics, but seems getting worse previously and encouraged him to follow up with [facility]  -He knows to use laxative, especially lactulose for his constipation -He was previously given Lasix by [facility] to help his ascites. Since improving I suggest he reduce down to half tablet or once every other day.   -He previously reported that he is not taking lasix as prescribed. -he will continue to follow up with [facility]  5. Hep C, successfully  treated -Continue follow-up with liver clinic NP [facility]  6. Anxiety and insomnia  -He has Xanax for anxiety.  -continue Ativan 1 mg at bedtime as needed for insomnia, he knows not to take with Xanax together, he knows not to take during the day. He does not feel Ativan is working well for sleep   7. Fatigue and weight gain/loss -He reported Dillon fatigue and weakness since his narcotics dose was significantly decreased -His TSH was previously elevated but T3 and T4 levels were normal. We previously discussed the side effect of hypothyroidism from Nivolumab, I previously referred him to Dr. Loanne Drilling for evaluation to see if he needs low-dose Synthroid but he did not go. Will continue monitoring  -I did not suspect his weight gain is related to fluid retention. I previously suggested he hold Megace and reduce ensure boosts to once daily. He should use a balanced diet and exercise to help.  -Given previous reaction to Suboxone he had not been eating much and loss 10 pounds in a little over a week.  -I previously discussed  dietician referral and he declined. He had gained some weight back and weight currently stable.  8. HTN -According to patient, his PCP increased him to Hyrdralazine 200mg  in AM and 100mg  in the PM daily, along with another BP medication he cannot name.  -I encouraged him to continue to follow up with his PCP.    9. Smoking  -Patient has started back smoking due to the fear of not getting better -I previously recommended a smoking cessation program. He refused.  10. Depression and anxiety  -The patient previously reported feelings of depression related to his diagnosis. -I am worried that he feels hopeless, he was seen by our social worker Webb Silversmith before who has referred him to a psychiatrist at [facility] -He notes being depressed due to his limited abilities. He denies thoughts of suicide or self harm.  -I previously sent referral to behavioral health (06/29/17), but he does  not plan to go to appointment. -Mood is stable  11. Iron deficient anemia  -10/16/16 lab results was consistent with iron deficiency, possible related to occluded GI bleeding -He is on Xarelto, no clinical overt bleeding. -He received IV feraheme 10/16/16 and in 10/2016  -He had GI work up with upper endoscopy and colonoscopy per Dr. [name] on 11/02/16. Biopsy of the stomach showed mild chronic gastritis, negative for H. Pylori or malignancy. Colon and rectal polyps were negative for dysplasia or malignancy. He will continue to f/u with GI -if he develops anemia or GI bleeding again, will stop Xarelto    12. Goal of care discussion, DNR/DNI  -We previously discussed the incurable nature of his cancer, and the overall poor prognosis, especially if he does not have good response to chemotherapy or progress on chemo -The patient understands the goal of care is palliative.    Plan -I will give normal saline 1 L today, along with Zofran 8 mg and dexamethasone 4 mg -I called in oxycodone 10 mg, 1- 0.5 tablets every 6 hours as needed for his symptoms and pain, total of 40 tablets, no refills. -also refilled his xanax  -He is scheduled to see pain specialist Dr. Luan Pulling on 10/9, I spoke with him  -will hold on his nivolumab treatment today, and rescheduled to 2 weeks   All questions were answered. The patient knows to call the clinic with any problems, questions or concerns.  I spent 20 minutes counseling the patient face to face. Pt's partner had many questions, and I answered to her satisfaction. The total time spent in the appointment was 25 minutes and Dillon than 50% was on counseling.  Oneal Deputy, am acting as scribe for Truitt Merle, MD.   I have reviewed the above documentation for accuracy and completeness, and I agree with the above.    Truitt Merle  10/22/2017

## 2017-10-22 ENCOUNTER — Ambulatory Visit (HOSPITAL_BASED_OUTPATIENT_CLINIC_OR_DEPARTMENT_OTHER): Payer: Medicare Other | Admitting: Hematology

## 2017-10-22 ENCOUNTER — Encounter: Payer: Self-pay | Admitting: Hematology

## 2017-10-22 ENCOUNTER — Telehealth: Payer: Self-pay

## 2017-10-22 ENCOUNTER — Inpatient Hospital Stay: Payer: Medicare Other

## 2017-10-22 ENCOUNTER — Inpatient Hospital Stay: Payer: Medicare Other | Attending: Hematology

## 2017-10-22 ENCOUNTER — Ambulatory Visit: Payer: Medicare Other

## 2017-10-22 ENCOUNTER — Inpatient Hospital Stay (HOSPITAL_BASED_OUTPATIENT_CLINIC_OR_DEPARTMENT_OTHER): Payer: Medicare Other | Admitting: Medical

## 2017-10-22 VITALS — BP 126/60 | HR 94 | Temp 97.5°F | Resp 19 | Ht 73.0 in | Wt 182.7 lb

## 2017-10-22 DIAGNOSIS — R110 Nausea: Secondary | ICD-10-CM | POA: Insufficient documentation

## 2017-10-22 DIAGNOSIS — E46 Unspecified protein-calorie malnutrition: Secondary | ICD-10-CM

## 2017-10-22 DIAGNOSIS — Z86718 Personal history of other venous thrombosis and embolism: Secondary | ICD-10-CM | POA: Insufficient documentation

## 2017-10-22 DIAGNOSIS — K521 Toxic gastroenteritis and colitis: Secondary | ICD-10-CM

## 2017-10-22 DIAGNOSIS — G893 Neoplasm related pain (acute) (chronic): Secondary | ICD-10-CM | POA: Insufficient documentation

## 2017-10-22 DIAGNOSIS — G4700 Insomnia, unspecified: Secondary | ICD-10-CM | POA: Diagnosis not present

## 2017-10-22 DIAGNOSIS — R109 Unspecified abdominal pain: Secondary | ICD-10-CM | POA: Diagnosis not present

## 2017-10-22 DIAGNOSIS — C220 Liver cell carcinoma: Secondary | ICD-10-CM

## 2017-10-22 DIAGNOSIS — F17210 Nicotine dependence, cigarettes, uncomplicated: Secondary | ICD-10-CM | POA: Insufficient documentation

## 2017-10-22 DIAGNOSIS — F419 Anxiety disorder, unspecified: Secondary | ICD-10-CM

## 2017-10-22 DIAGNOSIS — M545 Low back pain: Secondary | ICD-10-CM | POA: Diagnosis not present

## 2017-10-22 DIAGNOSIS — K7031 Alcoholic cirrhosis of liver with ascites: Secondary | ICD-10-CM

## 2017-10-22 DIAGNOSIS — R61 Generalized hyperhidrosis: Secondary | ICD-10-CM

## 2017-10-22 DIAGNOSIS — Z7901 Long term (current) use of anticoagulants: Secondary | ICD-10-CM

## 2017-10-22 DIAGNOSIS — Z79899 Other long term (current) drug therapy: Secondary | ICD-10-CM | POA: Diagnosis not present

## 2017-10-22 DIAGNOSIS — R530 Neoplastic (malignant) related fatigue: Secondary | ICD-10-CM | POA: Diagnosis not present

## 2017-10-22 DIAGNOSIS — D500 Iron deficiency anemia secondary to blood loss (chronic): Secondary | ICD-10-CM

## 2017-10-22 LAB — CBC WITH DIFFERENTIAL (CANCER CENTER ONLY)
BASOS ABS: 0.1 10*3/uL (ref 0.0–0.1)
BASOS PCT: 1 %
Eosinophils Absolute: 0.4 10*3/uL (ref 0.0–0.5)
Eosinophils Relative: 4 %
HCT: 36.8 % — ABNORMAL LOW (ref 38.4–49.9)
HEMOGLOBIN: 12.5 g/dL — AB (ref 13.0–17.1)
Lymphocytes Relative: 28 %
Lymphs Abs: 2.3 10*3/uL (ref 0.9–3.3)
MCH: 29.9 pg (ref 27.2–33.4)
MCHC: 34 g/dL (ref 32.0–36.0)
MCV: 87.9 fL (ref 79.3–98.0)
MONO ABS: 0.6 10*3/uL (ref 0.1–0.9)
Monocytes Relative: 7 %
NEUTROS ABS: 4.9 10*3/uL (ref 1.5–6.5)
NEUTROS PCT: 60 %
Platelet Count: 197 10*3/uL (ref 140–400)
RBC: 4.18 MIL/uL — AB (ref 4.20–5.82)
RDW: 14.4 % (ref 11.0–14.6)
WBC: 8.2 10*3/uL (ref 4.0–10.3)

## 2017-10-22 LAB — CMP (CANCER CENTER ONLY)
ALBUMIN: 4.5 g/dL (ref 3.5–5.0)
ALT: 21 U/L (ref 0–44)
AST: 40 U/L (ref 15–41)
Alkaline Phosphatase: 154 U/L — ABNORMAL HIGH (ref 38–126)
Anion gap: 13 (ref 5–15)
BILIRUBIN TOTAL: 0.8 mg/dL (ref 0.3–1.2)
BUN: 15 mg/dL (ref 8–23)
CO2: 18 mmol/L — ABNORMAL LOW (ref 22–32)
Calcium: 9.5 mg/dL (ref 8.9–10.3)
Chloride: 108 mmol/L (ref 98–111)
Creatinine: 1.42 mg/dL — ABNORMAL HIGH (ref 0.61–1.24)
GFR, Est AFR Am: 58 mL/min — ABNORMAL LOW (ref >60)
GFR, Estimated: 50 mL/min — ABNORMAL LOW (ref >60)
GLUCOSE: 94 mg/dL (ref 70–99)
POTASSIUM: 3.7 mmol/L (ref 3.5–5.1)
Sodium: 139 mmol/L (ref 135–145)
TOTAL PROTEIN: 8.2 g/dL — AB (ref 6.5–8.1)

## 2017-10-22 LAB — TSH: TSH: 1.956 u[IU]/mL (ref 0.320–4.118)

## 2017-10-22 LAB — IRON AND TIBC
Iron: 139 ug/dL (ref 42–163)
SATURATION RATIOS: 30 % — AB (ref 42–163)
TIBC: 468 ug/dL — ABNORMAL HIGH (ref 202–409)
UIBC: 328 ug/dL

## 2017-10-22 LAB — FERRITIN: Ferritin: 39 ng/mL (ref 24–336)

## 2017-10-22 MED ORDER — ONDANSETRON HCL 4 MG/2ML IJ SOLN
INTRAMUSCULAR | Status: AC
  Filled 2017-10-22: qty 2

## 2017-10-22 MED ORDER — ONDANSETRON HCL 4 MG/2ML IJ SOLN
8.0000 mg | Freq: Once | INTRAMUSCULAR | Status: AC
  Administered 2017-10-22: 8 mg via INTRAVENOUS

## 2017-10-22 MED ORDER — DEXAMETHASONE SODIUM PHOSPHATE 10 MG/ML IJ SOLN
4.0000 mg | Freq: Once | INTRAMUSCULAR | Status: AC
  Administered 2017-10-22: 4 mg via INTRAVENOUS

## 2017-10-22 MED ORDER — ALPRAZOLAM 1 MG PO TABS: 1.0000 mg | ORAL_TABLET | Freq: Two times a day (BID) | ORAL | 0 refills | Status: AC | PRN

## 2017-10-22 MED ORDER — DEXAMETHASONE SODIUM PHOSPHATE 10 MG/ML IJ SOLN
INTRAMUSCULAR | Status: AC
  Filled 2017-10-22: qty 1

## 2017-10-22 MED ORDER — SODIUM CHLORIDE 0.9 % IV SOLN: Freq: Once | INTRAVENOUS | Status: DC

## 2017-10-22 MED ORDER — OXYCODONE HCL 10 MG PO TABS: 10.0000 mg | ORAL_TABLET | Freq: Four times a day (QID) | ORAL | 0 refills | Status: DC

## 2017-10-22 MED ORDER — SODIUM CHLORIDE 0.9 % IV SOLN: 4.0000 mg | Freq: Once | INTRAVENOUS | Status: DC

## 2017-10-22 MED ORDER — SODIUM CHLORIDE 0.9 % IV SOLN
Freq: Once | INTRAVENOUS | Status: AC
  Administered 2017-10-22: 15:00:00 via INTRAVENOUS
  Filled 2017-10-22: qty 250

## 2017-10-22 MED ORDER — HEPARIN SOD (PORK) LOCK FLUSH 100 UNIT/ML IV SOLN
500.0000 [IU] | Freq: Once | INTRAVENOUS | Status: AC
  Administered 2017-10-22: 500 [IU]
  Filled 2017-10-22: qty 5

## 2017-10-22 MED ORDER — OXYCODONE HCL 10 MG PO TABS: 10.0000 mg | ORAL_TABLET | Freq: Four times a day (QID) | ORAL | 0 refills | Status: AC

## 2017-10-22 MED ORDER — SODIUM CHLORIDE 0.9% FLUSH
10.0000 mL | Freq: Once | INTRAVENOUS | Status: AC
  Administered 2017-10-22: 10 mL
  Filled 2017-10-22: qty 10

## 2017-10-22 MED FILL — oxyCODONE HCL 10 MG TABS: 10 | 7 days supply | Qty: 40 | Fill #0

## 2017-10-22 NOTE — Progress Notes (Signed)
Patient was seen by Dr. Burr Medico. Sandi Mealy, MHS, PA-C

## 2017-10-22 NOTE — Telephone Encounter (Signed)
Patient calls stating has been sick ever since chemo two weeks ago. Not much better.  Asked him why he did not come in for his appointment this morning? Stating didn't feel good.  Wants Xanax refilled.  I explained it would have been important to come in to have Dr. Burr Medico assess him.    Will call patient back after consulting with Dr. Burr Medico.

## 2017-10-22 NOTE — Progress Notes (Signed)
Dr. Burr Medico at chairside with pt in infusion room. VO for Dex 4mg  IVP, Zofran 8mg , IVP for nausea. IV Fluids to run over 11/2 hr.

## 2017-10-22 NOTE — Telephone Encounter (Signed)
Spoke with patient instructed to come in for IVF today and Dr. Burr Medico will prescribe pain medication until he can get in with Dr. Maryjean Ka next Wednesday, he is in agreement, lab at 2:00 and [facility] at 2:30.

## 2017-10-23 LAB — AFP TUMOR MARKER: AFP, SERUM, TUMOR MARKER: 3.5 ng/mL (ref 0.0–8.3)

## 2017-10-25 MED FILL — ALPRAZolam 1 MG TABS: 1 | 30 days supply | Qty: 60 | Fill #0

## 2017-10-27 DIAGNOSIS — C220 Liver cell carcinoma: Secondary | ICD-10-CM | POA: Diagnosis not present

## 2017-10-27 DIAGNOSIS — R52 Pain, unspecified: Secondary | ICD-10-CM | POA: Diagnosis not present

## 2017-10-27 MED FILL — oxyCODONE HCL 10 MG TABS: 10 | 30 days supply | Qty: 120 | Fill #0

## 2017-10-28 DIAGNOSIS — Z23 Encounter for immunization: Secondary | ICD-10-CM | POA: Diagnosis not present

## 2017-11-02 DIAGNOSIS — Z79891 Long term (current) use of opiate analgesic: Secondary | ICD-10-CM | POA: Diagnosis not present

## 2017-11-02 DIAGNOSIS — C220 Liver cell carcinoma: Secondary | ICD-10-CM | POA: Diagnosis not present

## 2017-11-02 DIAGNOSIS — G894 Chronic pain syndrome: Secondary | ICD-10-CM | POA: Diagnosis not present

## 2017-11-03 ENCOUNTER — Telehealth: Payer: Self-pay

## 2017-11-03 ENCOUNTER — Telehealth: Payer: Self-pay | Admitting: Hematology

## 2017-11-03 NOTE — Telephone Encounter (Signed)
FAXED RECORDS TO [facility],PA RELEASE ID [id number]

## 2017-11-03 NOTE — Telephone Encounter (Signed)
I called pt back. He states he is in severe pain, no energy, feels sick overall, not sure if he has fever.  He has been feeling like this since his last nivolumab treatment.  He went to see pain specialist Dr. Arlyss Gandy week and was prescribed oxycodone 10 mg as needed.  He says it is not helping his pain, and he asked me to prescribe higher dose narcotics.  I explained to Kevin Dillon that I will talk to Dr. Luan Pulling and let him handle his pain issue. He became angry, states he will talk to our cancer center VP Skip, "before this turns into a mess". I recommend him to go to ED or see our [facility] tomorrow if he is not feeling well, and I will see him also. He wants to cancel his appointment on this Friday. He finally hanged up the phone after I clearly stated that I will not prescribe narcotics for him.   Kevin Dillon  11/03/2017

## 2017-11-03 NOTE — Telephone Encounter (Signed)
Patient calls requesting that Dr. Burr Medico call him.

## 2017-11-05 ENCOUNTER — Inpatient Hospital Stay: Payer: Medicare Other | Admitting: Nurse Practitioner

## 2017-11-05 ENCOUNTER — Ambulatory Visit: Payer: Medicare Other

## 2017-11-05 ENCOUNTER — Telehealth: Payer: Self-pay

## 2017-11-05 ENCOUNTER — Inpatient Hospital Stay: Payer: Medicare Other

## 2017-11-05 NOTE — Telephone Encounter (Signed)
Patient called requesting return call, no answer, left voice message, instructed patient to leave a detailed message about what he needs and I will return his call.

## 2017-11-08 ENCOUNTER — Telehealth: Payer: Self-pay

## 2017-11-08 NOTE — Telephone Encounter (Signed)
Patient called requesting return phone call. Called back patient wanted Dr. Burr Medico to know that he did not hang up on her last week. Hit the off button with his chin by mistake.  Informed patient would let her know.  Also per Dr. Burr Medico told him she spoke with Dr. Maryjean Ka this morning regarding his pain medications.  Instructed him to call Dr. Maryjean Ka office and leave a message regarding the need for higher dosing.  Also spoke with patient regarding treatment.  He is to call back with his decision whether to have further chemo treatments.  He verbalized an understanding.

## 2017-11-10 DIAGNOSIS — K7469 Other cirrhosis of liver: Secondary | ICD-10-CM | POA: Diagnosis not present

## 2017-11-10 DIAGNOSIS — C220 Liver cell carcinoma: Secondary | ICD-10-CM | POA: Diagnosis not present

## 2017-11-16 ENCOUNTER — Other Ambulatory Visit: Payer: Medicare Other | Admitting: Primary Care

## 2017-11-16 ENCOUNTER — Telehealth: Payer: Self-pay

## 2017-11-16 DIAGNOSIS — G893 Neoplasm related pain (acute) (chronic): Secondary | ICD-10-CM | POA: Diagnosis not present

## 2017-11-16 DIAGNOSIS — Z7189 Other specified counseling: Secondary | ICD-10-CM | POA: Diagnosis not present

## 2017-11-16 DIAGNOSIS — C220 Liver cell carcinoma: Secondary | ICD-10-CM | POA: Diagnosis not present

## 2017-11-16 DIAGNOSIS — Z515 Encounter for palliative care: Secondary | ICD-10-CM | POA: Diagnosis not present

## 2017-11-16 NOTE — Progress Notes (Signed)
PALLIATIVE CARE CONSULT VISIT   PATIENT NAME: Kevin Dillon DOB: Mar 16, 1950 MRN: [id number]  PRIMARY CARE PROVIDER:   Antonietta Jewel, MD  REFERRING PROVIDER:  [name], NP, [place], [place], [place] Voice [phone] Fax [phone] RESPONSIBLE PARTY:   Self  ASSESSMENT and RECOMMENDATION:  1. Pain: Noted as biggest problem. Currently management not clear per patient report.   Has seen Dr. Selmer Dominion in a pain clinic.  Reached out to Dr. Beverely Pace who will follow patient and asked him to make f/u appt.  Acetaminophen 500 mg q 8 hrs. recommended to pt.  2. Nausea: Continue with OTC for diarrhea. States no weight loss. Has new compazine prescription.   3. Goals of Care: Prognosis appears to be better than six months given  liver function values, scans and discussion with oncology. Currently patient is only  open to seeking more chemo if pain can be controlled.  Work with [name], NP for support in community and transitions when appropriate. Return 1- 2 weeks.  Return 1-2 weeks for reassessment of plan of treatment and advance planning goals.  I spent 30 minutes providing this consultation,  from 1:30 pm to 2 pm. More than 50% of the time in this consultation was spent coordinating communication.   HISTORY OF PRESENT ILLNESS:  Kevin Dillon is a 67 y.o. year old male with multiple medical problems including cirrhosis, liver cancer, past hepatitis C infection, chronic pain. Palliative Care was asked to help address goals of care.   CODE STATUS: FULL  PPS: 50% HOSPICE ELIGIBILITY/DIAGNOSIS: no  PAST MEDICAL HISTORY:  Past Medical History:  Diagnosis Date  . Cancer ([place])    liver  . Cholelithiasis   . Chronic hepatitis C ([place])   . Chronic knee pain   . Chronic left shoulder pain   . CVA (cerebral infarction)   . Gout   . Hepatitis C    genotype 1b.  pt has been vaccinated against hep A and B.  . Hypertension   . Osteoarthritis   . Schatzki's ring   .  Tubular adenoma of colon 12/2011    SOCIAL HX:  Social History   Tobacco Use  . Smoking status: Former Smoker    Packs/day: 0.50    Years: 40.00    Pack years: 20.00    Types: Cigarettes    Last attempt to quit: 01/19/2012    Years since quitting: 5.8  . Smokeless tobacco: Never Used  . Tobacco comment: Smokes 1/2 pack of cigarettes daily  Substance Use Topics  . Alcohol use: No    Comment: HX 2 beers 3-4 days per week; QUIT DEC 2013    ALLERGIES: No Known Allergies   PERTINENT MEDICATIONS:  Outpatient Encounter Medications as of 11/16/2017  Medication Sig  . ALPRAZolam (XANAX) 1 MG tablet Take 1 tablet (1 mg total) by mouth 2 (two) times daily as needed for anxiety.  . furosemide (LASIX) 20 MG tablet Take 20 mg by mouth daily.  . hydrALAZINE (APRESOLINE) 25 MG tablet Take by mouth daily.   Marland Kitchen lactulose (CHRONULAC) 10 GM/15ML solution Take 30 g by mouth daily as needed for moderate constipation.   . lidocaine-prilocaine (EMLA) cream Apply 1 application topically as needed. Apply to portacath 1 1/2 hours - 2 hours prior to procedures as needed.  . megestrol (MEGACE) 40 MG/ML suspension Take 400 mg by mouth daily.   . metoprolol tartrate (LOPRESSOR) 100 MG tablet Take 100 mg by mouth every morning.  Marland Kitchen  mirtazapine (REMERON) 7.5 MG tablet Take 1 tablet (7.5 mg total) by mouth at bedtime.  . Multiple Vitamins-Minerals (CENTRUM SILVER PO) Take 1 tablet by mouth daily.   . potassium chloride SA (K-DUR,KLOR-CON) 20 MEQ tablet Take 1 tablet (20 mEq total) by mouth daily. Take 20 meq by mouth daily for 7 days.  . prochlorperazine (COMPAZINE) 10 MG tablet TAKE 1 TABLET BY MOUTH EVERY 8 HOURS AS NEEDED FOR NAUSEA/VOMITING  . rivaroxaban (XARELTO) 20 MG TABS tablet TAKE 1 TABLET BY MOUTH DAILY WITH SUPPER.   No facility-administered encounter medications on file as of 11/16/2017.     PHYSICAL EXAM:  VS: 98.8-105-18 wt 186 ht 6'1"  117/64  General: NAD, frail appearing, thin. States poor  intake Cardiovascular: regular rate and rhythm Pulmonary: clear ant fields, no cough, Denies DOE Abdomen: soft, nontender, + bowel sounds. Endorses abdominal pain in upper right quadrant. Endorses n/v/d. Has used immodium with good results.  GU: no suprapubic tenderness Extremities: no edema, no joint deformities. Denies falls Skin: no rashes.  Earlier chemo caused oral and foot blisters. Now healed. Neurological: Weakness, essential tremor,  but otherwise nonfocal.  Cyndia Skeeters, DNP, AGPCNP-BC

## 2017-11-16 NOTE — Telephone Encounter (Signed)
Phone call placed to patient to introduce Palliative Care and to schedule visit with NP. Visit scheduled for today @ 1:00pm

## 2017-11-17 ENCOUNTER — Telehealth: Payer: Self-pay | Admitting: Primary Care

## 2017-11-17 ENCOUNTER — Telehealth: Payer: Self-pay

## 2017-11-17 NOTE — Telephone Encounter (Signed)
Returned phone call to patient, informed him he has appointments on Friday, he stated he is not coming back to the [facility], not getting any further treatments, Dr. Burr Medico aware, Cira Rue NP aware. Upset that Dr. Burr Medico will not prescribe anything strong for his pain.

## 2017-11-17 NOTE — Telephone Encounter (Signed)
T/c to instruct pt to make appointment with Selmer Dominion, MD who will manage pain in pain clinic. Community Palliative to follow up in 2 weeks. Patient voices understanding and MD number left.

## 2017-11-19 ENCOUNTER — Ambulatory Visit: Payer: Medicare Other | Admitting: Nurse Practitioner

## 2017-11-19 ENCOUNTER — Ambulatory Visit: Payer: Medicare Other

## 2017-11-19 ENCOUNTER — Other Ambulatory Visit: Payer: Medicare Other

## 2017-11-23 DIAGNOSIS — Z5181 Encounter for therapeutic drug level monitoring: Secondary | ICD-10-CM | POA: Diagnosis not present

## 2017-11-23 DIAGNOSIS — C220 Liver cell carcinoma: Secondary | ICD-10-CM | POA: Diagnosis not present

## 2017-11-23 DIAGNOSIS — Z79891 Long term (current) use of opiate analgesic: Secondary | ICD-10-CM | POA: Diagnosis not present

## 2017-11-23 DIAGNOSIS — G894 Chronic pain syndrome: Secondary | ICD-10-CM | POA: Diagnosis not present

## 2017-11-26 MED FILL — XARELTO 20 MG TABLET: 20 | 30 days supply | Qty: 30 | Fill #2

## 2017-11-29 ENCOUNTER — Other Ambulatory Visit: Payer: Medicare Other | Admitting: Primary Care

## 2017-11-29 DIAGNOSIS — Z515 Encounter for palliative care: Secondary | ICD-10-CM | POA: Diagnosis not present

## 2017-11-29 DIAGNOSIS — C220 Liver cell carcinoma: Secondary | ICD-10-CM | POA: Diagnosis not present

## 2017-11-29 DIAGNOSIS — G893 Neoplasm related pain (acute) (chronic): Secondary | ICD-10-CM

## 2017-11-29 NOTE — Progress Notes (Signed)
PALLIATIVE CARE CONSULT VISIT   PATIENT NAME: Kevin Dillon DOB: 26-Oct-1950 MRN: [id number]  PRIMARY CARE PROVIDER:   Antonietta Jewel, MD  REFERRING PROVIDER:  Antonietta Jewel, MD [address] [place], [place] [address]  RESPONSIBLE PARTY:   SELF  ASSESSMENT and RECOMMENDATIONS:   1.Referral for follow up of hepatic cancer: working with NP at [facility] for referral for second opinion. She should have some information on this on Wednesday. Decisions here will drive goals of care options.  2. Pain: Saw Pain clinic MD Beverely Pace. 11/5.  Pt is upset because plan of care said oxycodone 15 mg q 6 #120 but he was only prescribed 40. Call in to Dr. Beverely Pace for clarification of future prescriptions.  3. Goals of care: Discussed disease course if chemo is not restarted.  He states he has been fighting this disease for 5 years and is getting tired of it. Discussed choice for hospice if he does not continue chemo. States he wants to continue chemo at this time and have pain relief. Still awaiting an appointment at [facility], after which his choice options will be more clear.   Calls placed to Dr. Beverely Pace and Wendie Chess NP for clarification of plans of care. Will continue to follow patient to clarify goals of care. F/u 2-4 weeks.  I spent 25  minutes providing this consultation,  from 11:45 am   to 12:10 pm. More than 50% of the time in this consultation was spent coordinating communication.   HISTORY OF PRESENT ILLNESS:  Kevin Dillon is a 67 y.o. year old male with multiple medical problems including hepatic cancer, cured hepatitis c,cirrhosis, anemia,uncontrolled pain . Palliative Care was asked to help address goals of care.   CODE STATUS:  FULL PPS: 50% HOSPICE ELIGIBILITY/DIAGNOSIS: TBD  PAST MEDICAL HISTORY:  Past Medical History:  Diagnosis Date  . Cancer ([place])    liver  . Cholelithiasis   . Chronic hepatitis C ([place])   . Chronic knee pain   . Chronic left shoulder  pain   . CVA (cerebral infarction)   . Gout   . Hepatitis C    genotype 1b.  pt has been vaccinated against hep A and B.  . Hypertension   . Osteoarthritis   . Schatzki's ring   . Tubular adenoma of colon 12/2011    SOCIAL HX:  Social History   Tobacco Use  . Smoking status: Former Smoker    Packs/day: 0.50    Years: 40.00    Pack years: 20.00    Types: Cigarettes    Last attempt to quit: 01/19/2012    Years since quitting: 5.8  . Smokeless tobacco: Never Used  . Tobacco comment: Smokes 1/2 pack of cigarettes daily  Substance Use Topics  . Alcohol use: No    Comment: HX 2 beers 3-4 days per week; QUIT DEC 2013    ALLERGIES: No Known Allergies   PERTINENT MEDICATIONS:  Outpatient Encounter Medications as of 11/29/2017  Medication Sig  . ALPRAZolam (XANAX) 1 MG tablet Take 1 tablet (1 mg total) by mouth 2 (two) times daily as needed for anxiety.  . furosemide (LASIX) 20 MG tablet Take 20 mg by mouth daily.  . hydrALAZINE (APRESOLINE) 25 MG tablet Take by mouth daily.   Marland Kitchen lactulose (CHRONULAC) 10 GM/15ML solution Take 30 g by mouth daily as needed for moderate constipation.   . lidocaine-prilocaine (EMLA) cream Apply 1 application topically as needed. Apply to portacath  1 1/2 hours - 2 hours prior to procedures as needed.  . megestrol (MEGACE) 40 MG/ML suspension Take 400 mg by mouth daily.   . metoprolol tartrate (LOPRESSOR) 100 MG tablet Take 100 mg by mouth every morning.  . mirtazapine (REMERON) 7.5 MG tablet Take 1 tablet (7.5 mg total) by mouth at bedtime.  . Multiple Vitamins-Minerals (CENTRUM SILVER PO) Take 1 tablet by mouth daily.   . potassium chloride SA (K-DUR,KLOR-CON) 20 MEQ tablet Take 1 tablet (20 mEq total) by mouth daily. Take 20 meq by mouth daily for 7 days.  . prochlorperazine (COMPAZINE) 10 MG tablet TAKE 1 TABLET BY MOUTH EVERY 8 HOURS AS NEEDED FOR NAUSEA/VOMITING  . rivaroxaban (XARELTO) 20 MG TABS tablet TAKE 1 TABLET BY MOUTH DAILY WITH SUPPER.    No facility-administered encounter medications on file as of 11/29/2017.     PHYSICAL EXAM:   VS 98.3 109-18 133/84  General: Pain 7/10, frail appearing, thin.Endorses fair appetite Cardiovascular: regular rate and rhythm, S1S2 Pulmonary: clear all  Fields, smoking currently Abdomen: extended and firm, nontender, + bowel sounds. Denies constipation and diarhea. MSK/Extremities: no edema, no joint deformities. Ambulates independently  Skin: no rashes, no wounds Neurological: Weakness but otherwise nonfocal.   Cyndia Skeeters DNP, AGPCNP-BC

## 2017-12-02 ENCOUNTER — Telehealth: Payer: Self-pay | Admitting: Hematology

## 2017-12-02 NOTE — Telephone Encounter (Signed)
Faxed medical records to [facility] @ [phone]. Release ZC#[id number]

## 2017-12-03 ENCOUNTER — Telehealth: Payer: Self-pay | Admitting: Nurse Practitioner

## 2017-12-03 NOTE — Telephone Encounter (Signed)
Tried to call phone kept ringing

## 2017-12-07 ENCOUNTER — Other Ambulatory Visit: Payer: Medicare Other | Admitting: Primary Care

## 2017-12-07 DIAGNOSIS — Z515 Encounter for palliative care: Secondary | ICD-10-CM

## 2017-12-07 DIAGNOSIS — G894 Chronic pain syndrome: Secondary | ICD-10-CM | POA: Diagnosis not present

## 2017-12-07 DIAGNOSIS — Z79891 Long term (current) use of opiate analgesic: Secondary | ICD-10-CM | POA: Diagnosis not present

## 2017-12-07 DIAGNOSIS — C220 Liver cell carcinoma: Secondary | ICD-10-CM | POA: Diagnosis not present

## 2017-12-07 NOTE — Progress Notes (Signed)
[facility] Telephone: [phone] Fax: [phone]  PATIENT NAME: Kevin Dillon DOB: 1950-05-29 MRN: [id number]  PRIMARY CARE PROVIDER:   Antonietta Jewel, MD   recently changed to Dr. Daneen Schick for a closer office  REFERRING PROVIDER:  Antonietta Jewel, MD [address] [place], [place] [address]  RESPONSIBLE PARTY:   self  ASSESSMENT and RECOMMENDATIONS:  1. Treatment options: Still awaiting consultation at [facility] via his Liver clinic NP, Dawn Drazek's referral. Discussed options for future treatment vs hospice election. Patient is still deciding. Will have his port flushed this week to maintain access.   2. Pain Management: Is seeing Dr. Beverely Pace for pain management. Has a pain management contract but can't remember about it. Will obtain a hard copy. Will also discuss antidepressant therapy in the context of pain management. Pain management will include  Oxycodone 15 mg q 6 hr, Amytriptyline 25 mg at HS  And Acetaminophen 1000 mg bid po. Patient will return in 1 month  3.Goals of care: Patient has unique situation of surpassing his prognosis by some years and not having clear disease course. We have discussed his choices for pursuing more therapy or choosing hospice. He is frustrated with lack of clear choices but will continue to weigh his options.  Continue to follow for goals of care discussion and choices. Will discuss DNR and MOST on next visit. States he has donated his body to [facility] but will check on this since it has been 25 years.  Revisit 2 weeks for ongoing goals of care, and addressing MOST and DNR forms.  I spent 60 minutes providing this consultation,  from 11:00 to 12:00. More than 50% of the time in this consultation was spent coordinating communication.   HISTORY OF PRESENT ILLNESS:  Kevin Dillon is a 67 y.o. year old male with multiple medical problems including hepatitis, hepatic carcinoma, depression.  Palliative Care was asked to help address goals of care.   CODE STATUS: FULL  PPS: 50% HOSPICE ELIGIBILITY/DIAGNOSIS: TBD PAST MEDICAL HISTORY:  Past Medical History:  Diagnosis Date  . Cancer ([place])    liver  . Cholelithiasis   . Chronic hepatitis C ([place])   . Chronic knee pain   . Chronic left shoulder pain   . CVA (cerebral infarction)   . Gout   . Hepatitis C    genotype 1b.  pt has been vaccinated against hep A and B.  . Hypertension   . Osteoarthritis   . Schatzki's ring   . Tubular adenoma of colon 12/2011    SOCIAL HX:  Social History   Tobacco Use  . Smoking status: Former Smoker    Packs/day: 0.50    Years: 40.00    Pack years: 20.00    Types: Cigarettes    Last attempt to quit: 01/19/2012    Years since quitting: 5.8  . Smokeless tobacco: Never Used  . Tobacco comment: Smokes 1/2 pack of cigarettes daily  Substance Use Topics  . Alcohol use: No    Comment: HX 2 beers 3-4 days per week; QUIT DEC 2013    ALLERGIES: No Known Allergies   PERTINENT MEDICATIONS:  Outpatient Encounter Medications as of 12/07/2017  Medication Sig  . ALPRAZolam (XANAX) 1 MG tablet Take 1 tablet (1 mg total) by mouth 2 (two) times daily as needed for anxiety.  . furosemide (LASIX) 20 MG tablet Take 20 mg by mouth daily.  . hydrALAZINE (APRESOLINE) 25 MG tablet Take by mouth  daily.   . lactulose (CHRONULAC) 10 GM/15ML solution Take 30 g by mouth daily as needed for moderate constipation.   . lidocaine-prilocaine (EMLA) cream Apply 1 application topically as needed. Apply to portacath 1 1/2 hours - 2 hours prior to procedures as needed.  . megestrol (MEGACE) 40 MG/ML suspension Take 400 mg by mouth daily.   . metoprolol tartrate (LOPRESSOR) 100 MG tablet Take 100 mg by mouth every morning.  . mirtazapine (REMERON) 7.5 MG tablet Take 1 tablet (7.5 mg total) by mouth at bedtime.  . Multiple Vitamins-Minerals (CENTRUM SILVER PO) Take 1 tablet by mouth daily.   . potassium chloride SA  (K-DUR,KLOR-CON) 20 MEQ tablet Take 1 tablet (20 mEq total) by mouth daily. Take 20 meq by mouth daily for 7 days.  . prochlorperazine (COMPAZINE) 10 MG tablet TAKE 1 TABLET BY MOUTH EVERY 8 HOURS AS NEEDED FOR NAUSEA/VOMITING  . rivaroxaban (XARELTO) 20 MG TABS tablet TAKE 1 TABLET BY MOUTH DAILY WITH SUPPER.   No facility-administered encounter medications on file as of 12/07/2017.     PHYSICAL EXAM:  136/75 Pulse 105 wt 177lb  General: NAD, frail appearing, thin Cardiovascular: regular rate and rhythm States he is not taking bp meds anymore  Pulmonary: no cough Abdomen: distended, nontender,  occ use of lactulose. Extremities: no edema, no joint deformities Skin: no rashes or wounds Neurological: Weakness but otherwise nonfocal    [place], AGPCNP-BC

## 2017-12-08 DIAGNOSIS — Z1211 Encounter for screening for malignant neoplasm of colon: Secondary | ICD-10-CM | POA: Diagnosis not present

## 2017-12-09 ENCOUNTER — Inpatient Hospital Stay: Payer: Medicare Other | Attending: Hematology

## 2017-12-09 DIAGNOSIS — D500 Iron deficiency anemia secondary to blood loss (chronic): Secondary | ICD-10-CM

## 2017-12-09 DIAGNOSIS — C220 Liver cell carcinoma: Secondary | ICD-10-CM | POA: Insufficient documentation

## 2017-12-09 DIAGNOSIS — Z452 Encounter for adjustment and management of vascular access device: Secondary | ICD-10-CM | POA: Diagnosis not present

## 2017-12-09 MED ORDER — SODIUM CHLORIDE 0.9 % IV SOLN: 510.0000 mg | Freq: Once | INTRAVENOUS | Status: DC

## 2017-12-09 MED ORDER — HEPARIN SOD (PORK) LOCK FLUSH 100 UNIT/ML IV SOLN
500.0000 [IU] | Freq: Once | INTRAVENOUS | Status: AC
  Administered 2017-12-09: 500 [IU]
  Filled 2017-12-09: qty 5

## 2017-12-09 MED ORDER — SODIUM CHLORIDE 0.9% FLUSH
10.0000 mL | Freq: Once | INTRAVENOUS | Status: AC
  Administered 2017-12-09: 10 mL
  Filled 2017-12-09: qty 10

## 2017-12-09 MED ORDER — SODIUM CHLORIDE 0.9% FLUSH
3.0000 mL | Freq: Once | INTRAVENOUS | Status: DC | PRN
  Filled 2017-12-09: qty 10

## 2017-12-09 MED ORDER — ALTEPLASE 2 MG IJ SOLR
2.0000 mg | Freq: Once | INTRAMUSCULAR | Status: DC | PRN
  Filled 2017-12-09: qty 2

## 2017-12-29 DIAGNOSIS — Z5181 Encounter for therapeutic drug level monitoring: Secondary | ICD-10-CM | POA: Diagnosis not present

## 2017-12-29 DIAGNOSIS — G894 Chronic pain syndrome: Secondary | ICD-10-CM | POA: Diagnosis not present

## 2017-12-30 ENCOUNTER — Other Ambulatory Visit: Payer: Self-pay | Admitting: Hematology

## 2017-12-30 MED FILL — XARELTO 20 MG TABLET: 20 | 30 days supply | Qty: 30 | Fill #3

## 2017-12-31 MED FILL — PROCHLORPERAZINE 10 MG TAB: 10 | 10 days supply | Qty: 30 | Fill #0

## 2018-01-05 ENCOUNTER — Other Ambulatory Visit: Payer: Medicare Other | Admitting: Primary Care

## 2018-01-05 DIAGNOSIS — Z515 Encounter for palliative care: Secondary | ICD-10-CM

## 2018-01-05 DIAGNOSIS — D126 Benign neoplasm of colon, unspecified: Secondary | ICD-10-CM | POA: Diagnosis not present

## 2018-01-05 DIAGNOSIS — C220 Liver cell carcinoma: Secondary | ICD-10-CM

## 2018-01-05 DIAGNOSIS — G893 Neoplasm related pain (acute) (chronic): Secondary | ICD-10-CM | POA: Diagnosis not present

## 2018-01-05 NOTE — Progress Notes (Signed)
[facility] Telephone: [phone] Fax: [phone]  PATIENT NAME: Kevin Dillon DOB: [birth year] MRN: [id number]  PRIMARY CARE PROVIDER:   Buzzy Han, MD  REFERRING PROVIDER:  Buzzy Han, MD [place], [place] [address]  RESPONSIBLE PARTY:   Extended Emergency Contact Information Primary Emergency Contact: Heath,Pat Address: [address]          [place], [place] [address] [place] Phone: [phone] Mobile Phone: [phone] Relation: Significant other Secondary Emergency Contact: Elvera Lennox, [place] [address] [place] Phone: [phone] Mobile Phone: [phone] Relation: Daughter   ASSESSMENT and RECOMMENDATIONS:   1. Pain: Finished month of narcotics but failed drug test. Pain ongoing issue. Missed pain clinic appt due to not being able to pay. States he will try methadone clinic. Will ask PCP about this. Will also discuss sleeping medication with PCP.  2. Ascites: Developing increasing fullness. Subjectively increasing. States feeling of fullness and concern that umbilicus is distended from pressure. Will measure abdomen for tracking ascites. Discussed paracentesis as relief measure for increasing ascites. Will discuss with PCP on visit tomorrow.   3. Goals of Care: Full Code. States he does not think he is ready for MOST form,  not differentiating between DNR and hospitalizations. Will continue to discuss on future visits. Discussed hospice appropriateness. His functional status is still high but without treatments, could be ready soon. Will continue to monitor for eligibility.  Will continue to follow for symptom management and goals of care discussions. Return 2 weeks or prn.  I spent 40 minutes providing this consultation,  from 1500 to 1540. More than 50% of the time in this consultation was spent coordinating communication.   HISTORY OF PRESENT  ILLNESS:  Kevin Dillon is a 67 y.o. year old male with  multiple medical problems including hepatitis, hepatic carcinoma, depression. Palliative Care was asked to help address goals of care.   CODE STATUS:  FULL PPS: 50% HOSPICE ELIGIBILITY/DIAGNOSIS: TBD  PAST MEDICAL HISTORY:  Past Medical History:  Diagnosis Date  . Cancer ([place])    liver  . Cholelithiasis   . Chronic hepatitis C ([place])   . Chronic knee pain   . Chronic left shoulder pain   . CVA (cerebral infarction)   . Gout   . Hepatitis C    genotype 1b.  pt has been vaccinated against hep A and B.  . Hypertension   . Osteoarthritis   . Schatzki's ring   . Tubular adenoma of colon 12/2011    SOCIAL HX:  Social History   Tobacco Use  . Smoking status: Former Smoker    Packs/day: 0.50    Years: 40.00    Pack years: 20.00    Types: Cigarettes    Last attempt to quit: 01/19/2012    Years since quitting: 5.9  . Smokeless tobacco: Never Used  . Tobacco comment: Smokes 1/2 pack of cigarettes daily  Substance Use Topics  . Alcohol use: No    Comment: HX 2 beers 3-4 days per week; QUIT DEC 2013    ALLERGIES: No Known Allergies   PERTINENT MEDICATIONS:  Outpatient Encounter Medications as of 01/05/2018  Medication Sig  . ALPRAZolam (XANAX) 1 MG tablet Take 1 tablet (1 mg total) by mouth 2 (two) times daily as needed for anxiety.  . furosemide (LASIX) 20 MG tablet Take 20 mg by mouth daily.  . hydrALAZINE (APRESOLINE) 25 MG tablet Take by  mouth daily.   Marland Kitchen lactulose (CHRONULAC) 10 GM/15ML solution Take 30 g by mouth daily as needed for moderate constipation.   . lidocaine-prilocaine (EMLA) cream Apply 1 application topically as needed. Apply to portacath 1 1/2 hours - 2 hours prior to procedures as needed.  . megestrol (MEGACE) 40 MG/ML suspension Take 400 mg by mouth daily.   . metoprolol tartrate (LOPRESSOR) 100 MG tablet Take 100 mg by mouth every morning.  . mirtazapine (REMERON) 7.5 MG tablet Take 1 tablet (7.5 mg  total) by mouth at bedtime.  . Multiple Vitamins-Minerals (CENTRUM SILVER PO) Take 1 tablet by mouth daily.   . potassium chloride SA (K-DUR,KLOR-CON) 20 MEQ tablet Take 1 tablet (20 mEq total) by mouth daily. Take 20 meq by mouth daily for 7 days.  . prochlorperazine (COMPAZINE) 10 MG tablet TAKE 1 TABLET BY MOUTH EVERY 8 HOURS AS NEEDED FOR NAUSEA/VOMITING  . rivaroxaban (XARELTO) 20 MG TABS tablet TAKE 1 TABLET BY MOUTH DAILY WITH SUPPER.   No facility-administered encounter medications on file as of 01/05/2018.     PHYSICAL EXAM:  VS 154/92  98.2-98-18 oxygen 95% RA  Wt 183 a month ago, will weigh tomorrow. General: NAD, frail appearing, thin. Pain ongoing.  Cardiovascular: regular rate and rhythm, s1s2 Pulmonary: clear all fields, no cough Abdomen: distended, nontender, + bowel sounds. Umbilicus protruding. Extremities: no edema, no joint deformities Skin: no rashes, no wounds. Neurological: Weakness but otherwise nonfocal. Ambulatory.  Cyndia Skeeters DNP, AGPCNP-BC

## 2018-01-24 ENCOUNTER — Other Ambulatory Visit: Payer: Medicare Other | Admitting: Primary Care

## 2018-01-24 DIAGNOSIS — G893 Neoplasm related pain (acute) (chronic): Secondary | ICD-10-CM

## 2018-01-24 DIAGNOSIS — Z515 Encounter for palliative care: Secondary | ICD-10-CM

## 2018-01-24 DIAGNOSIS — K7469 Other cirrhosis of liver: Secondary | ICD-10-CM

## 2018-01-24 DIAGNOSIS — C220 Liver cell carcinoma: Secondary | ICD-10-CM | POA: Diagnosis not present

## 2018-01-24 NOTE — Progress Notes (Signed)
[facility] Telephone: [phone] Fax: [phone]  PATIENT NAME: Kevin Dillon DOB: 07-19-1950 MRN: [id number]  PRIMARY CARE PROVIDER:   Buzzy Han, MD  REFERRING PROVIDER:   [name], NP  RESPONSIBLE PARTY:   Extended Emergency Contact Information Primary Emergency Contact: Heath,Pat Address: [address]          [place], [place] [address] [place] Phone: [phone] Mobile Phone: [phone] Relation: Significant other Secondary Emergency Contact: Elvera Lennox, [place] [address] [place] of [place] Phone: [phone] Mobile Phone: [phone] Relation: Daughter  Liver specialist [name], NP, referred to Palliative care in October 2019 for management of symptoms and goals of care clarification.   ASSESSMENT and RECOMMENDATIONS:   1. Pain:  Pain is escalating, 8-9/10. Did not return to pain clinic due to  co pays and inability to pay it. Recommend Morphine ER 15 mg bid p with Morphine IR 5 mg q 6 hours po for break through, reassess to adjust dose after 1-2 weeks. Has been on oxycodone for a long time so medication should be  changed to decrease tolerance. Patient did not make a plan for ongoing pain medication at PCP appointment. I called PCP office to discuss.  At this point patient's pain management needs would also be an indication for hospice referral.  2. Ascites; Increased 2 cm in 3 weeks. Patient was instructed to measure girth 3 weeks ago. States pain in URQ and fullness in abdomen. Liver clinic appointment made to review status and assess for disease progression,  for January 16 at 1130 AM with [name], NP, first available.  Intake is decreasing and patient self-notes muscle wasting.  3. Anxiety: recommend Lorazepam 0.5 mg q 8 hours po prn for anxiety associated with terminal disease process. Patient is agitated and anxious about his symptom progression and discomfort.  4.  Hypotension: C/o dizziness and had high bp at PCP office several weeks ago. Today it is  129/78 - Sitting; standing- 80/60 exhibiting orthostasis. Pt states his water consumption is fair. No edema.  Instructed to check bp for pattern and hold lisinopril if it is too low/symptomatic and call PCP.  5. Goals of Care: Patient is so uncomfortable that he now is ready for hospice admission. Prior to today he was saying he would pursue more treatment but now has determined his goals have changed to comfort. He states going to the hospital would not benefit him, and that what he wants most is to be comfortable. He states he has a new PCP. T/c to Dr. Darlina Sicilian to discuss hospice referral, messages left for call back.  Palliative to continue to follow to coordinate care, provide symptom management and clarify goals of care. Will discuss hospice referral with PCP. Return 1 week or prn.  I spent 60 minutes providing this consultation,  from 1530 to 1630. More than 50% of the time in this consultation was spent coordinating communication.   HISTORY OF PRESENT ILLNESS:  Kevin Dillon is a 68 y.o. year old male with multiple medical problems including hepatitis, hepatic carcinoma, depression.. Palliative Care was asked to help address goals of care and symptom management  CODE STATUS: FULL   PPS: 40% HOSPICE ELIGIBILITY/DIAGNOSIS: yes/ liver neoplasm  PAST MEDICAL HISTORY:  Past Medical History:  Diagnosis Date  . Cancer ([facility])    liver  . Cholelithiasis   . Chronic hepatitis C ([place])   . Chronic knee pain   .  Chronic left shoulder pain   . CVA (cerebral infarction)   . Gout   . Hepatitis C    genotype 1b.  pt has been vaccinated against hep A and B.  . Hypertension   . Osteoarthritis   . Schatzki's ring   . Tubular adenoma of colon 12/2011    SOCIAL HX:  Social History   Tobacco Use  . Smoking status: Former Smoker    Packs/day: 0.50    Years: 40.00    Pack years: 20.00    Types:  Cigarettes    Last attempt to quit: 01/19/2012    Years since quitting: 6.0  . Smokeless tobacco: Never Used  . Tobacco comment: Smokes 1/2 pack of cigarettes daily  Substance Use Topics  . Alcohol use: No    Comment: HX 2 beers 3-4 days per week; QUIT DEC 2013    ALLERGIES: No Known Allergies   PERTINENT MEDICATIONS:  Outpatient Encounter Medications as of 01/24/2018  Medication Sig  . ALPRAZolam (XANAX) 1 MG tablet Take 1 tablet (1 mg total) by mouth 2 (two) times daily as needed for anxiety.  . furosemide (LASIX) 20 MG tablet Take 20 mg by mouth daily.  . hydrALAZINE (APRESOLINE) 25 MG tablet Take by mouth daily.   Marland Kitchen lactulose (CHRONULAC) 10 GM/15ML solution Take 30 g by mouth daily as needed for moderate constipation.   . lidocaine-prilocaine (EMLA) cream Apply 1 application topically as needed. Apply to portacath 1 1/2 hours - 2 hours prior to procedures as needed.  . megestrol (MEGACE) 40 MG/ML suspension Take 400 mg by mouth daily.   . metoprolol tartrate (LOPRESSOR) 100 MG tablet Take 100 mg by mouth every morning.  . mirtazapine (REMERON) 7.5 MG tablet Take 1 tablet (7.5 mg total) by mouth at bedtime.  . Multiple Vitamins-Minerals (CENTRUM SILVER PO) Take 1 tablet by mouth daily.   . potassium chloride SA (K-DUR,KLOR-CON) 20 MEQ tablet Take 1 tablet (20 mEq total) by mouth daily. Take 20 meq by mouth daily for 7 days.  . prochlorperazine (COMPAZINE) 10 MG tablet TAKE 1 TABLET BY MOUTH EVERY 8 HOURS AS NEEDED FOR NAUSEA/VOMITING  . rivaroxaban (XARELTO) 20 MG TABS tablet TAKE 1 TABLET BY MOUTH DAILY WITH SUPPER.   No facility-administered encounter medications on file as of 01/24/2018.     PHYSICAL EXAM:  VS: circ 25 cm, 99.1-79-18 129/78 - Sitting; standing- 80/60  176.6 lbs, 7 pound weight loss in 2 months, Pain 8/10  General: NAD, frail appearing, thin, weight loss, painful  HEENT: slight scleral icterus Cardiovascular: regular rate and rhythm, S1S2, orthostasis Pulmonary:  clear all fields, no cough Abdomen: soft, nontender, + bowel sounds, abdominal girth has increased 2 cm in 1 month per pt. Report.  Extremities: no pedal  edema, no joint deformities  Skin: no rashes, no wounds Neurological: Weakness, vertigo/orthostasis, ambulatory but weaker. Psychiatric: Depressed,  anxious  Cyndia Skeeters DNP, AGPCNP-BC

## 2018-01-25 ENCOUNTER — Other Ambulatory Visit: Payer: Self-pay | Admitting: Hematology

## 2018-01-27 DIAGNOSIS — F419 Anxiety disorder, unspecified: Secondary | ICD-10-CM | POA: Diagnosis not present

## 2018-01-27 DIAGNOSIS — F329 Major depressive disorder, single episode, unspecified: Secondary | ICD-10-CM | POA: Diagnosis not present

## 2018-01-27 DIAGNOSIS — Z0000 Encounter for general adult medical examination without abnormal findings: Secondary | ICD-10-CM | POA: Diagnosis not present

## 2018-01-27 DIAGNOSIS — C220 Liver cell carcinoma: Secondary | ICD-10-CM | POA: Diagnosis not present

## 2018-01-27 DIAGNOSIS — G894 Chronic pain syndrome: Secondary | ICD-10-CM | POA: Diagnosis not present

## 2018-01-27 DIAGNOSIS — R112 Nausea with vomiting, unspecified: Secondary | ICD-10-CM | POA: Diagnosis not present

## 2018-02-03 ENCOUNTER — Telehealth: Payer: Self-pay | Admitting: Hematology

## 2018-02-03 DIAGNOSIS — B182 Chronic viral hepatitis C: Secondary | ICD-10-CM | POA: Diagnosis not present

## 2018-02-03 DIAGNOSIS — K7469 Other cirrhosis of liver: Secondary | ICD-10-CM | POA: Diagnosis not present

## 2018-02-03 DIAGNOSIS — C220 Liver cell carcinoma: Secondary | ICD-10-CM | POA: Diagnosis not present

## 2018-02-03 DIAGNOSIS — F419 Anxiety disorder, unspecified: Secondary | ICD-10-CM | POA: Diagnosis not present

## 2018-02-03 NOTE — Telephone Encounter (Signed)
Faxed medical records to [facility], Release [phone]

## 2018-02-04 ENCOUNTER — Other Ambulatory Visit: Payer: Self-pay | Admitting: Nurse Practitioner

## 2018-02-04 DIAGNOSIS — C220 Liver cell carcinoma: Secondary | ICD-10-CM

## 2018-02-07 ENCOUNTER — Telehealth: Payer: Self-pay | Admitting: Primary Care

## 2018-02-07 NOTE — Telephone Encounter (Signed)
Was assessed for hospice services by [facility] ([facility] on 01/26/2018 but more information on disease progression was needed to certify for hospice. F/u with Wendie Chess, NP at the [facility] on 02/03/2018. Hospice decision TBD pending lab and imaging results.

## 2018-02-11 ENCOUNTER — Other Ambulatory Visit: Payer: Self-pay | Admitting: Nurse Practitioner

## 2018-02-11 DIAGNOSIS — C220 Liver cell carcinoma: Secondary | ICD-10-CM

## 2018-02-20 ENCOUNTER — Other Ambulatory Visit: Payer: Self-pay | Admitting: Hematology

## 2018-02-24 ENCOUNTER — Telehealth: Payer: Self-pay | Admitting: Primary Care

## 2018-02-24 NOTE — Telephone Encounter (Signed)
T/c from MD at  [facility] ongoing pain medication needs and care plan. Pt has been to PCP. Informed of current plan that he was seen by Wendie Chess, NP for assessment of liver disease. He is to have a scan before we can consider hospice admission to stage his disease. He was known to several pain management clinics, the last one he cancelled appts due to not being able to pay co pay. Plan made that patient needs to return to pain management clinic in order to have his pain needs reviewed and managed there. Patient needs to make and keep appts for scan; order is in per [name] NP. If his disease is active, we can revisit hospice admission. If his disease is stable he will need to be managed medically by PCP, NP Drazek and Dr. Beverely Pace of Pain management.

## 2018-02-25 ENCOUNTER — Other Ambulatory Visit: Payer: Self-pay | Admitting: Hematology

## 2018-02-25 DIAGNOSIS — T451X5A Adverse effect of antineoplastic and immunosuppressive drugs, initial encounter: Secondary | ICD-10-CM

## 2018-02-25 DIAGNOSIS — C220 Liver cell carcinoma: Secondary | ICD-10-CM

## 2018-02-25 DIAGNOSIS — D6481 Anemia due to antineoplastic chemotherapy: Secondary | ICD-10-CM

## 2018-03-09 ENCOUNTER — Ambulatory Visit
Admission: RE | Admit: 2018-03-09 | Discharge: 2018-03-09 | Disposition: A | Discharge status: Home / Self Care | Payer: Medicare Other | Source: Ambulatory Visit | Attending: Nurse Practitioner | Admitting: Nurse Practitioner

## 2018-03-09 DIAGNOSIS — C220 Liver cell carcinoma: Secondary | ICD-10-CM

## 2018-03-09 DIAGNOSIS — J439 Emphysema, unspecified: Secondary | ICD-10-CM | POA: Diagnosis not present

## 2018-03-09 DIAGNOSIS — R918 Other nonspecific abnormal finding of lung field: Secondary | ICD-10-CM | POA: Diagnosis not present

## 2018-03-09 MED ORDER — GADOXETATE DISODIUM 0.25 MMOL/ML IV SOLN
10.0000 mL | Freq: Once | INTRAVENOUS | Status: AC | PRN
  Administered 2018-03-09: 10 mL via INTRAVENOUS

## 2018-03-09 MED ORDER — IOPAMIDOL (ISOVUE-300) INJECTION 61%
75.0000 mL | Freq: Once | INTRAVENOUS | Status: AC | PRN
  Administered 2018-03-09: 75 mL via INTRAVENOUS

## 2018-03-17 DIAGNOSIS — C220 Liver cell carcinoma: Secondary | ICD-10-CM | POA: Diagnosis not present

## 2018-03-17 DIAGNOSIS — K7469 Other cirrhosis of liver: Secondary | ICD-10-CM | POA: Diagnosis not present

## 2018-03-17 DIAGNOSIS — R1011 Right upper quadrant pain: Secondary | ICD-10-CM | POA: Diagnosis not present

## 2018-03-22 ENCOUNTER — Other Ambulatory Visit: Payer: Medicare Other | Admitting: Adult Health Nurse Practitioner

## 2018-03-29 DIAGNOSIS — Z79891 Long term (current) use of opiate analgesic: Secondary | ICD-10-CM | POA: Diagnosis not present

## 2018-03-29 DIAGNOSIS — M25562 Pain in left knee: Secondary | ICD-10-CM | POA: Diagnosis not present

## 2018-03-29 DIAGNOSIS — G894 Chronic pain syndrome: Secondary | ICD-10-CM | POA: Diagnosis not present

## 2018-03-29 DIAGNOSIS — C220 Liver cell carcinoma: Secondary | ICD-10-CM | POA: Diagnosis not present

## 2018-04-02 DIAGNOSIS — F329 Major depressive disorder, single episode, unspecified: Secondary | ICD-10-CM | POA: Diagnosis not present

## 2018-04-02 DIAGNOSIS — F419 Anxiety disorder, unspecified: Secondary | ICD-10-CM | POA: Diagnosis not present

## 2018-04-02 DIAGNOSIS — G894 Chronic pain syndrome: Secondary | ICD-10-CM | POA: Diagnosis not present

## 2018-04-02 DIAGNOSIS — K7460 Unspecified cirrhosis of liver: Secondary | ICD-10-CM | POA: Diagnosis not present

## 2018-04-28 DIAGNOSIS — M25562 Pain in left knee: Secondary | ICD-10-CM | POA: Diagnosis not present

## 2018-04-28 DIAGNOSIS — Z79891 Long term (current) use of opiate analgesic: Secondary | ICD-10-CM | POA: Diagnosis not present

## 2018-04-28 DIAGNOSIS — G894 Chronic pain syndrome: Secondary | ICD-10-CM | POA: Diagnosis not present

## 2018-04-28 DIAGNOSIS — C220 Liver cell carcinoma: Secondary | ICD-10-CM | POA: Diagnosis not present

## 2018-05-12 DIAGNOSIS — I81 Portal vein thrombosis: Secondary | ICD-10-CM | POA: Diagnosis not present

## 2018-05-12 DIAGNOSIS — F339 Major depressive disorder, recurrent, unspecified: Secondary | ICD-10-CM | POA: Diagnosis not present

## 2018-05-12 DIAGNOSIS — I10 Essential (primary) hypertension: Secondary | ICD-10-CM | POA: Diagnosis not present

## 2018-05-12 DIAGNOSIS — I2699 Other pulmonary embolism without acute cor pulmonale: Secondary | ICD-10-CM | POA: Diagnosis not present

## 2018-05-12 DIAGNOSIS — Z7189 Other specified counseling: Secondary | ICD-10-CM | POA: Diagnosis not present

## 2018-05-12 DIAGNOSIS — K769 Liver disease, unspecified: Secondary | ICD-10-CM | POA: Diagnosis not present

## 2018-06-01 ENCOUNTER — Telehealth: Payer: Self-pay

## 2018-06-01 ENCOUNTER — Other Ambulatory Visit: Payer: Medicare Other | Admitting: Adult Health Nurse Practitioner

## 2018-06-01 ENCOUNTER — Other Ambulatory Visit: Payer: Self-pay

## 2018-06-01 NOTE — Telephone Encounter (Signed)
Received phone call from patient requesting to set up a Telephone visit. Scheduled for today @ 3pm

## 2018-06-01 NOTE — Telephone Encounter (Signed)
Spoke with Ralene Bathe NP. Patient was discharged from [facility] on 03/21/2018 as there was no further assistance Palliative Care could offer. Patient to follow up with PCP for ongoing care.

## 2018-07-06 DIAGNOSIS — K30 Functional dyspepsia: Secondary | ICD-10-CM | POA: Diagnosis not present

## 2018-07-06 DIAGNOSIS — F419 Anxiety disorder, unspecified: Secondary | ICD-10-CM | POA: Diagnosis not present

## 2018-07-06 DIAGNOSIS — Z7189 Other specified counseling: Secondary | ICD-10-CM | POA: Diagnosis not present

## 2018-07-06 DIAGNOSIS — G894 Chronic pain syndrome: Secondary | ICD-10-CM | POA: Diagnosis not present

## 2018-07-06 DIAGNOSIS — C220 Liver cell carcinoma: Secondary | ICD-10-CM | POA: Diagnosis not present

## 2018-07-07 DIAGNOSIS — I10 Essential (primary) hypertension: Secondary | ICD-10-CM | POA: Diagnosis not present

## 2018-07-07 DIAGNOSIS — R630 Anorexia: Secondary | ICD-10-CM | POA: Diagnosis not present

## 2018-07-07 DIAGNOSIS — B1920 Unspecified viral hepatitis C without hepatic coma: Secondary | ICD-10-CM | POA: Diagnosis not present

## 2018-07-07 DIAGNOSIS — K7460 Unspecified cirrhosis of liver: Secondary | ICD-10-CM | POA: Diagnosis not present

## 2018-07-07 DIAGNOSIS — R180 Malignant ascites: Secondary | ICD-10-CM | POA: Diagnosis not present

## 2018-07-07 DIAGNOSIS — E039 Hypothyroidism, unspecified: Secondary | ICD-10-CM | POA: Diagnosis not present

## 2018-07-07 DIAGNOSIS — C220 Liver cell carcinoma: Secondary | ICD-10-CM | POA: Diagnosis not present

## 2018-07-07 DIAGNOSIS — R1084 Generalized abdominal pain: Secondary | ICD-10-CM | POA: Diagnosis not present

## 2018-07-07 DIAGNOSIS — D630 Anemia in neoplastic disease: Secondary | ICD-10-CM | POA: Diagnosis not present

## 2018-07-07 DIAGNOSIS — F329 Major depressive disorder, single episode, unspecified: Secondary | ICD-10-CM | POA: Diagnosis not present

## 2018-07-07 DIAGNOSIS — R634 Abnormal weight loss: Secondary | ICD-10-CM | POA: Diagnosis not present

## 2018-07-08 DIAGNOSIS — R634 Abnormal weight loss: Secondary | ICD-10-CM | POA: Diagnosis not present

## 2018-07-08 DIAGNOSIS — R630 Anorexia: Secondary | ICD-10-CM | POA: Diagnosis not present

## 2018-07-08 DIAGNOSIS — R180 Malignant ascites: Secondary | ICD-10-CM | POA: Diagnosis not present

## 2018-07-08 DIAGNOSIS — C220 Liver cell carcinoma: Secondary | ICD-10-CM | POA: Diagnosis not present

## 2018-07-08 DIAGNOSIS — K7460 Unspecified cirrhosis of liver: Secondary | ICD-10-CM | POA: Diagnosis not present

## 2018-07-08 DIAGNOSIS — R1084 Generalized abdominal pain: Secondary | ICD-10-CM | POA: Diagnosis not present

## 2018-07-19 DIAGNOSIS — R180 Malignant ascites: Secondary | ICD-10-CM | POA: Diagnosis not present

## 2018-07-19 DIAGNOSIS — C220 Liver cell carcinoma: Secondary | ICD-10-CM | POA: Diagnosis not present

## 2018-07-19 DIAGNOSIS — R1084 Generalized abdominal pain: Secondary | ICD-10-CM | POA: Diagnosis not present

## 2018-07-19 DIAGNOSIS — K7460 Unspecified cirrhosis of liver: Secondary | ICD-10-CM | POA: Diagnosis not present

## 2018-07-19 DIAGNOSIS — R634 Abnormal weight loss: Secondary | ICD-10-CM | POA: Diagnosis not present

## 2018-07-19 DIAGNOSIS — R630 Anorexia: Secondary | ICD-10-CM | POA: Diagnosis not present

## 2018-07-20 DIAGNOSIS — R1084 Generalized abdominal pain: Secondary | ICD-10-CM | POA: Diagnosis not present

## 2018-07-20 DIAGNOSIS — D630 Anemia in neoplastic disease: Secondary | ICD-10-CM | POA: Diagnosis not present

## 2018-07-20 DIAGNOSIS — B1920 Unspecified viral hepatitis C without hepatic coma: Secondary | ICD-10-CM | POA: Diagnosis not present

## 2018-07-20 DIAGNOSIS — R630 Anorexia: Secondary | ICD-10-CM | POA: Diagnosis not present

## 2018-07-20 DIAGNOSIS — I10 Essential (primary) hypertension: Secondary | ICD-10-CM | POA: Diagnosis not present

## 2018-07-20 DIAGNOSIS — R180 Malignant ascites: Secondary | ICD-10-CM | POA: Diagnosis not present

## 2018-07-20 DIAGNOSIS — E039 Hypothyroidism, unspecified: Secondary | ICD-10-CM | POA: Diagnosis not present

## 2018-07-20 DIAGNOSIS — K7460 Unspecified cirrhosis of liver: Secondary | ICD-10-CM | POA: Diagnosis not present

## 2018-07-20 DIAGNOSIS — C220 Liver cell carcinoma: Secondary | ICD-10-CM | POA: Diagnosis not present

## 2018-07-20 DIAGNOSIS — R634 Abnormal weight loss: Secondary | ICD-10-CM | POA: Diagnosis not present

## 2018-07-20 DIAGNOSIS — F329 Major depressive disorder, single episode, unspecified: Secondary | ICD-10-CM | POA: Diagnosis not present

## 2018-07-26 DIAGNOSIS — R1084 Generalized abdominal pain: Secondary | ICD-10-CM | POA: Diagnosis not present

## 2018-07-26 DIAGNOSIS — R180 Malignant ascites: Secondary | ICD-10-CM | POA: Diagnosis not present

## 2018-07-26 DIAGNOSIS — R630 Anorexia: Secondary | ICD-10-CM | POA: Diagnosis not present

## 2018-07-26 DIAGNOSIS — R634 Abnormal weight loss: Secondary | ICD-10-CM | POA: Diagnosis not present

## 2018-07-26 DIAGNOSIS — K7460 Unspecified cirrhosis of liver: Secondary | ICD-10-CM | POA: Diagnosis not present

## 2018-07-26 DIAGNOSIS — C220 Liver cell carcinoma: Secondary | ICD-10-CM | POA: Diagnosis not present

## 2018-08-02 DIAGNOSIS — R630 Anorexia: Secondary | ICD-10-CM | POA: Diagnosis not present

## 2018-08-02 DIAGNOSIS — R634 Abnormal weight loss: Secondary | ICD-10-CM | POA: Diagnosis not present

## 2018-08-02 DIAGNOSIS — K7460 Unspecified cirrhosis of liver: Secondary | ICD-10-CM | POA: Diagnosis not present

## 2018-08-02 DIAGNOSIS — R180 Malignant ascites: Secondary | ICD-10-CM | POA: Diagnosis not present

## 2018-08-02 DIAGNOSIS — R1084 Generalized abdominal pain: Secondary | ICD-10-CM | POA: Diagnosis not present

## 2018-08-02 DIAGNOSIS — C220 Liver cell carcinoma: Secondary | ICD-10-CM | POA: Diagnosis not present

## 2018-08-03 ENCOUNTER — Telehealth: Payer: Self-pay | Admitting: *Deleted

## 2018-08-03 NOTE — Telephone Encounter (Signed)
Called and left message requesting an appointment for PAC flush. Last seen by Dr. Burr Medico on 10/22/2017. Forwarded message to Dr. Ernestina Penna collaborative nurse.

## 2018-08-04 DIAGNOSIS — R180 Malignant ascites: Secondary | ICD-10-CM | POA: Diagnosis not present

## 2018-08-04 DIAGNOSIS — K7460 Unspecified cirrhosis of liver: Secondary | ICD-10-CM | POA: Diagnosis not present

## 2018-08-04 DIAGNOSIS — R634 Abnormal weight loss: Secondary | ICD-10-CM | POA: Diagnosis not present

## 2018-08-04 DIAGNOSIS — R630 Anorexia: Secondary | ICD-10-CM | POA: Diagnosis not present

## 2018-08-04 DIAGNOSIS — C220 Liver cell carcinoma: Secondary | ICD-10-CM | POA: Diagnosis not present

## 2018-08-04 DIAGNOSIS — R1084 Generalized abdominal pain: Secondary | ICD-10-CM | POA: Diagnosis not present

## 2018-08-04 IMAGING — CT CT CHEST W/ CM
2 of 9 series · 13 of 46 positions shown, 15 images · IV contrast (ISOVUE 300)
Comparison: Multiple exams, including liver MRI from 03/08/2017 and
CT scan from 01/08/2017

CLINICAL DATA: Hepatocellular carcinoma on nivolumab, restaging
assessment.

EXAM:
CT CHEST, ABDOMEN, AND PELVIS WITH CONTRAST
TECHNIQUE: Multidetector CT imaging of the chest, abdomen and pelvis was
performed following the standard protocol during bolus
administration of intravenous contrast.
CONTRAST:  100mL [id number] IOPAMIDOL ([id number]) INJECTION 61%

[Series 4: coronal arterial · coronal · arterial · 0.46mm/px · 3 of 110 slices shown]
[im 28/110  soft-tissue]
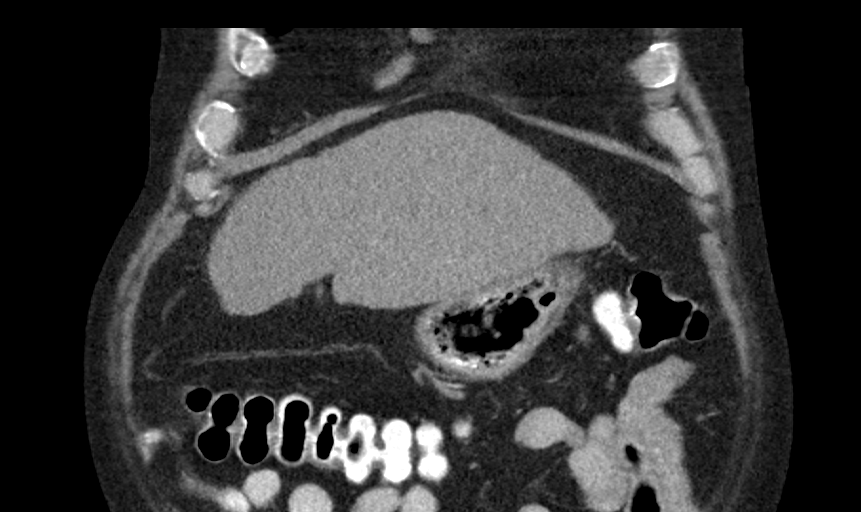
[im 55/110  soft-tissue]
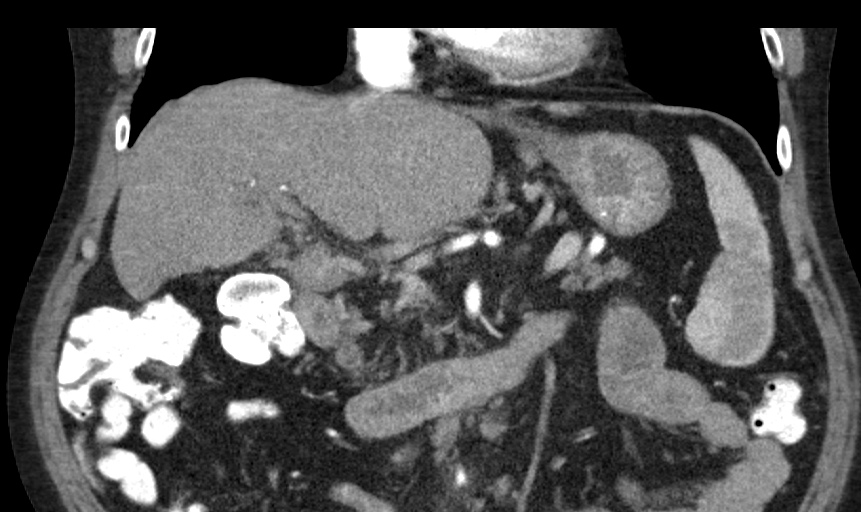
[im 82/110  soft-tissue]
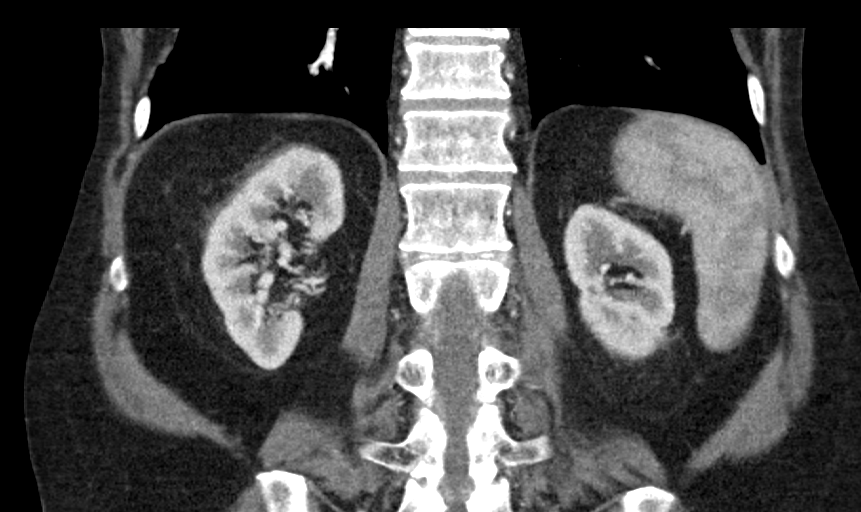

[Series 8: axial venous · axial · portal-venous · 0.84mm/px · z∈[-634,-58]mm · 10 of 232 slices shown, 12 images]
[im 20/232  soft-tissue]
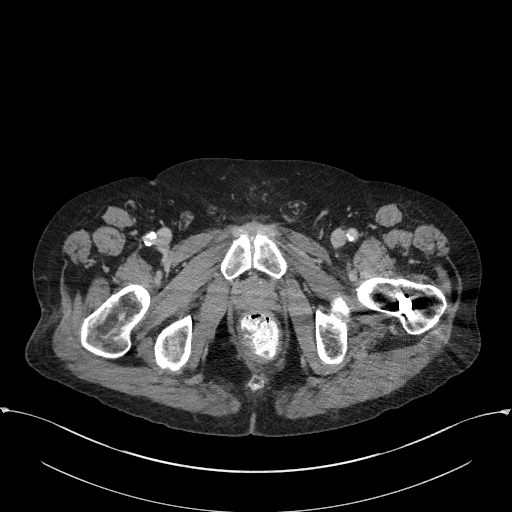
[im 20/232  bone]
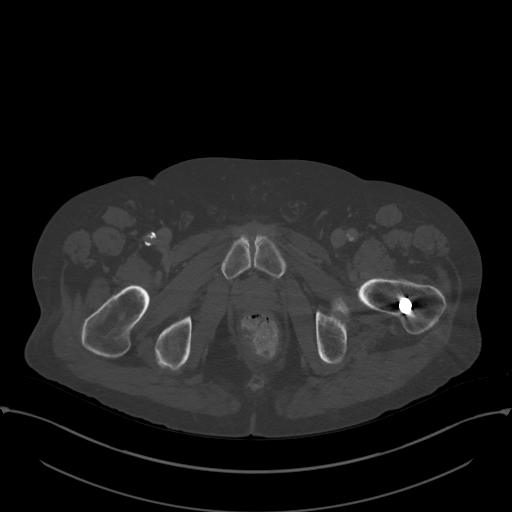
[im 39/232  soft-tissue]
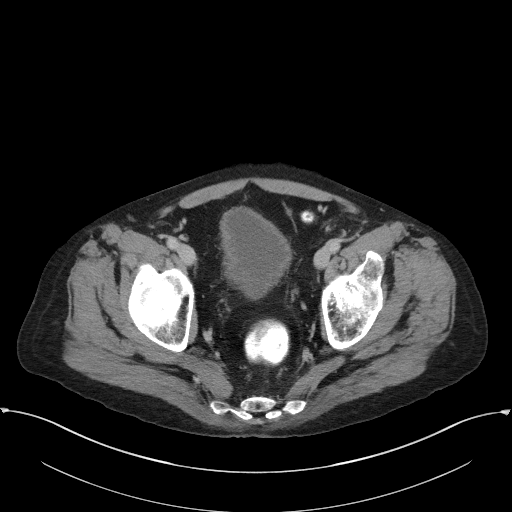
[im 58/232  soft-tissue]
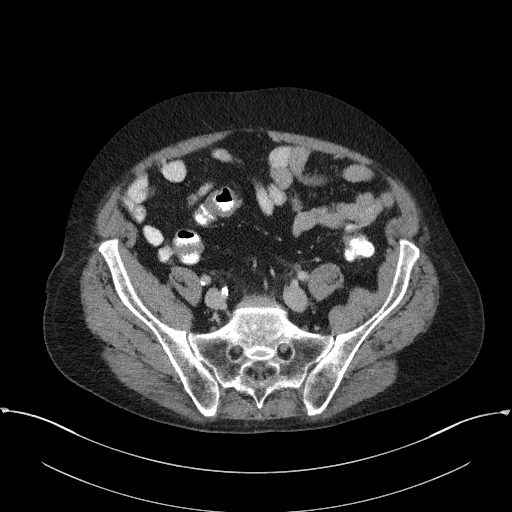
[im 78/232  soft-tissue]
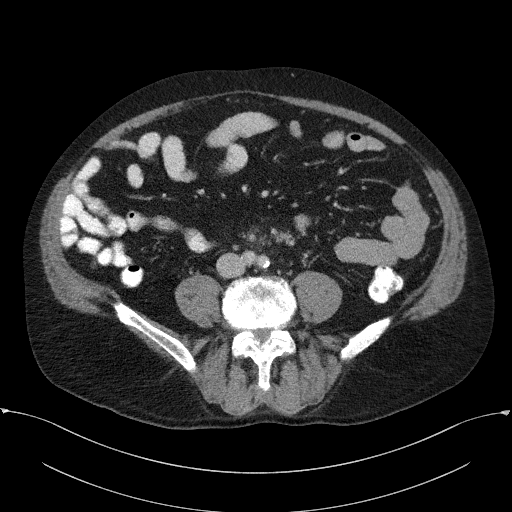
[im 97/232  soft-tissue]
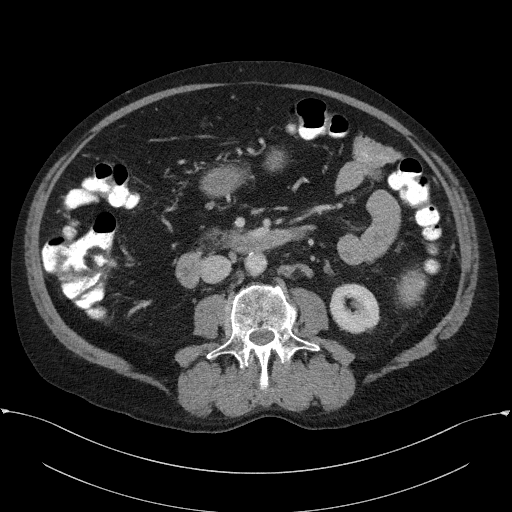
[im 135/232  soft-tissue]
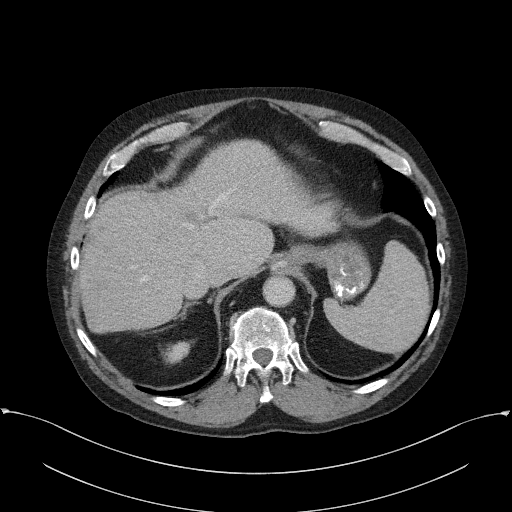
[im 155/232  soft-tissue]
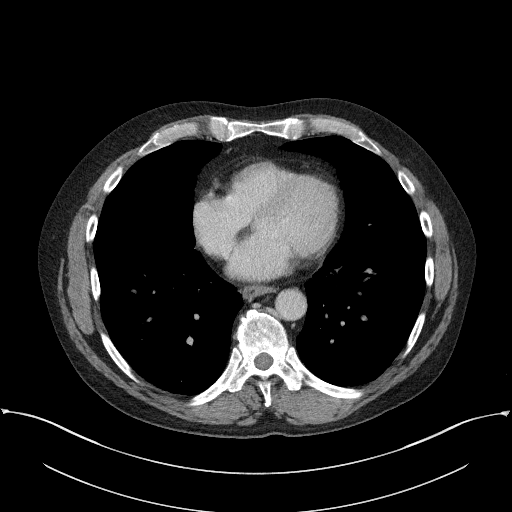
[im 174/232  soft-tissue]
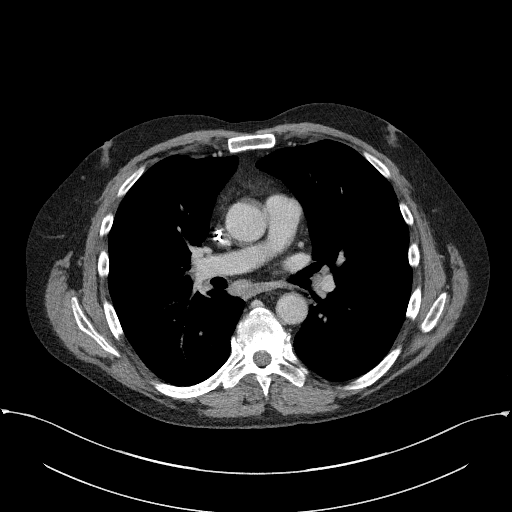
[im 193/232  soft-tissue]
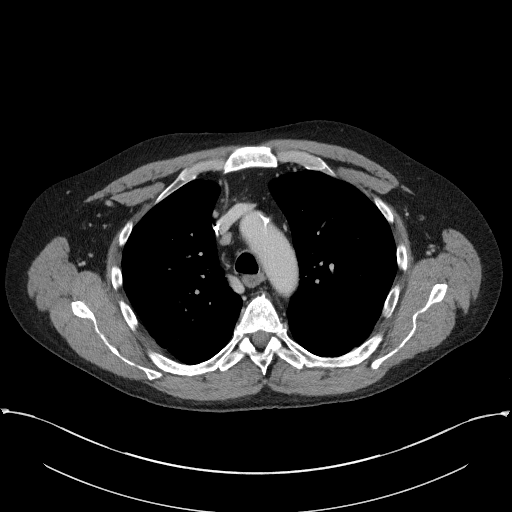
[im 193/232  bone]
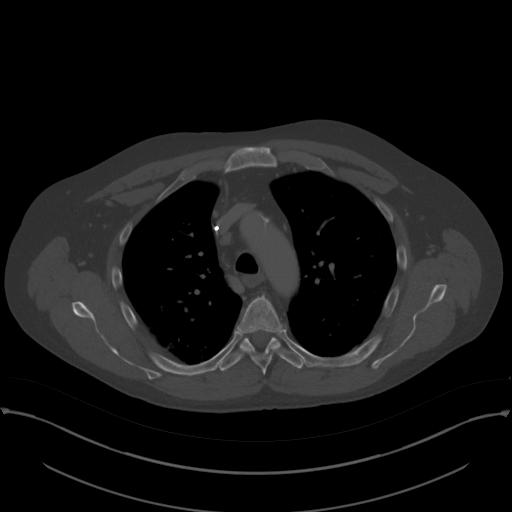
[im 212/232  soft-tissue]
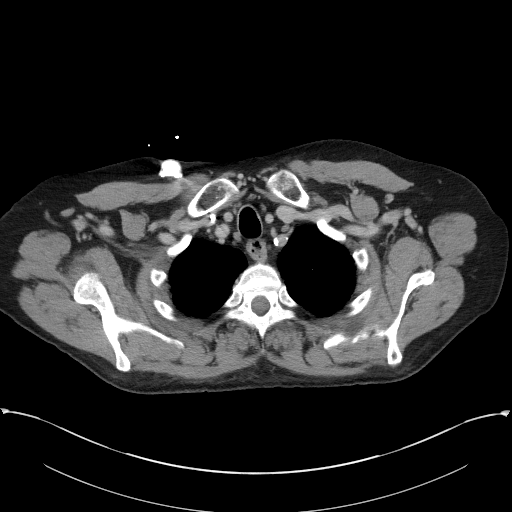

[13 of 46 positions shown; findings below may reference images not displayed]

FINDINGS: CT CHEST FINDINGS

Cardiovascular: Port-A-Cath tip: Cavoatrial junction.
Atherosclerotic calcification of the aortic arch and branch vessels.

Mediastinum/Nodes: 0.8 cm right paratracheal lymph node, image 38/8,
stable. A lower right paratracheal lymph node has a fatty hilum and
measures 1.0 cm in short axis on image 44/8, likewise stable. Other
upper normal sized lymph nodes are present in the mediastinum.

A right hilar lymph node measures 1.0 cm in short axis on image
54/8, previously 1.1 cm. A lower paraesophageal lymph node measures
1.1 cm in short axis on image 91/8, formerly 1.0 cm. A subcarinal
lymph node measures 1.4 cm in short axis on image 62/8, stable.

Lungs/Pleura: Biapical pleuroparenchymal scarring. Paraseptal
emphysema. Bandlike density posteriorly in the right lung apex with
associated nodularity, dominant nodule 1.0 by 0.7 cm on image 48/12,
formerly 1.2 by 0.8 cm. Subtle reticulonodular opacity medially in
the right lower lobe at the lung base, similar to prior.

Mild airway thickening noted.

Musculoskeletal: Left trapezius lipoma similar to prior. Stable
calcifications in the left glenohumeral joint. Thoracic spondylosis.

CT ABDOMEN PELVIS FINDINGS

Hepatobiliary: Hepatic cirrhosis. The previous arterial phase
enhancing lesion in segment 4a of the liver measures 1.4 by 1.0 cm,
formerly the same by my measurements. Questionable 0.7 by 0.4 cm
arterial phase enhancing focus in segment 2 of the liver on image
[DATE], not well seen on the prior exam.

Contracted gallbladder noted with internal density probably from
gallstones. No biliary dilatation.

Pancreas: Unremarkable

Spleen: The spleen measures 10.1 by 5.6 by 13.6 cm.

Adrenals/Urinary Tract: Unremarkable

Stomach/Bowel: Unremarkable

Vascular/Lymphatic: Cavernous transformation of the portal vein.
Aortoiliac atherosclerotic vascular disease. Recanalized umbilical
vein. Atheromatous narrowing of the proximal SMA.

A dominant porta hepatis/peripancreatic lymph node measures 2.0 cm
in diameter on image 121/8, and previously measured 1.2 cm.
Scattered retroperitoneal lymph nodes include a 1.0 cm lymph node on
image 137/8. A right gastric node measures 1.0 cm in short axis on
image 101/8. Upper normal sized pelvic lymph nodes are also present.

Reproductive: Unremarkable

Other: No supplemental non-categorized findings.

Musculoskeletal: Faint capsular calcification along both hips. IM
nail in the left femur.
IMPRESSION: 1. Stable size of the dominant arterial phase enhancing lesion in
segment 4a of the liver, currently measuring 1.4 by 1.0 cm. There is
a questionable second focus measuring 0.7 by 0.4 cm in segment 2 of
the liver. Both merit surveillance.
2. A porta hepatis lymph node has enlarged compared to the prior
MRI, previously 1.2 cm and currently 2.0 cm. Although potentially
reactive, a malignant lymph node could also demonstrate such size
increase. This likewise merits careful surveillance. Otherwise is
borderline adenopathy in the chest and abdomen.
3. Mild reduced nodularity associated with a right apical bandlike
density, this appearance likewise merits surveillance.
4. Other imaging findings of potential clinical significance: Aortic
Atherosclerosis (7I49W-6DZ.Z). Emphysema ([id number].Z). Airway
thickening is present, suggesting bronchitis or reactive airways
disease. Contracted gallbladder with small gallstones. Cavernous
transformation of the portal vein with recanalized umbilical vein
indicating portal venous hypertension. Cirrhosis. Capsular
calcifications along the glenohumeral and hip joints.

## 2018-08-09 ENCOUNTER — Telehealth: Payer: Self-pay | Admitting: *Deleted

## 2018-08-09 DIAGNOSIS — R180 Malignant ascites: Secondary | ICD-10-CM | POA: Diagnosis not present

## 2018-08-09 DIAGNOSIS — R630 Anorexia: Secondary | ICD-10-CM | POA: Diagnosis not present

## 2018-08-09 DIAGNOSIS — K7460 Unspecified cirrhosis of liver: Secondary | ICD-10-CM | POA: Diagnosis not present

## 2018-08-09 DIAGNOSIS — R634 Abnormal weight loss: Secondary | ICD-10-CM | POA: Diagnosis not present

## 2018-08-09 DIAGNOSIS — R1084 Generalized abdominal pain: Secondary | ICD-10-CM | POA: Diagnosis not present

## 2018-08-09 DIAGNOSIS — C220 Liver cell carcinoma: Secondary | ICD-10-CM | POA: Diagnosis not present

## 2018-08-09 NOTE — Telephone Encounter (Addendum)
Called & spoke to pt.  He verifies that port has not been flushed sin 12/10/18.  He states that Hospice is coming in to see him & should be there soon.  Informed that they should be able to flush his port & asked him to have nurse call us.  He states that they told him to call us.  Will wait on ph call from RN.  Received call from Hospice RN & she states that Dr Lyman Speller would not give order to do at home since it had been so long & if clotted they would not have means to handle.  He preferred pt come to office to have done.  Discussed with Sandi Mealy PA & he suggested no need to have flushed at this point since not really needed & pt not receiving treatment.  We can always address this issue if ever needed for IV pain control.   Called & spoke to [facility] again & she states that pt feels anxious about coming to cancer center with Covid & they had just talked about this & agrees to just not flush port at this time.  Message routed to MD/Pod RN.

## 2018-08-16 DIAGNOSIS — R180 Malignant ascites: Secondary | ICD-10-CM | POA: Diagnosis not present

## 2018-08-16 DIAGNOSIS — R1084 Generalized abdominal pain: Secondary | ICD-10-CM | POA: Diagnosis not present

## 2018-08-16 DIAGNOSIS — R630 Anorexia: Secondary | ICD-10-CM | POA: Diagnosis not present

## 2018-08-16 DIAGNOSIS — C220 Liver cell carcinoma: Secondary | ICD-10-CM | POA: Diagnosis not present

## 2018-08-16 DIAGNOSIS — K7460 Unspecified cirrhosis of liver: Secondary | ICD-10-CM | POA: Diagnosis not present

## 2018-08-16 DIAGNOSIS — R634 Abnormal weight loss: Secondary | ICD-10-CM | POA: Diagnosis not present

## 2018-08-20 DIAGNOSIS — R630 Anorexia: Secondary | ICD-10-CM | POA: Diagnosis not present

## 2018-08-20 DIAGNOSIS — I10 Essential (primary) hypertension: Secondary | ICD-10-CM | POA: Diagnosis not present

## 2018-08-20 DIAGNOSIS — R180 Malignant ascites: Secondary | ICD-10-CM | POA: Diagnosis not present

## 2018-08-20 DIAGNOSIS — B1920 Unspecified viral hepatitis C without hepatic coma: Secondary | ICD-10-CM | POA: Diagnosis not present

## 2018-08-20 DIAGNOSIS — R634 Abnormal weight loss: Secondary | ICD-10-CM | POA: Diagnosis not present

## 2018-08-20 DIAGNOSIS — D630 Anemia in neoplastic disease: Secondary | ICD-10-CM | POA: Diagnosis not present

## 2018-08-20 DIAGNOSIS — K7460 Unspecified cirrhosis of liver: Secondary | ICD-10-CM | POA: Diagnosis not present

## 2018-08-20 DIAGNOSIS — R1084 Generalized abdominal pain: Secondary | ICD-10-CM | POA: Diagnosis not present

## 2018-08-20 DIAGNOSIS — F329 Major depressive disorder, single episode, unspecified: Secondary | ICD-10-CM | POA: Diagnosis not present

## 2018-08-20 DIAGNOSIS — C220 Liver cell carcinoma: Secondary | ICD-10-CM | POA: Diagnosis not present

## 2018-08-20 DIAGNOSIS — E039 Hypothyroidism, unspecified: Secondary | ICD-10-CM | POA: Diagnosis not present

## 2018-08-23 DIAGNOSIS — R180 Malignant ascites: Secondary | ICD-10-CM | POA: Diagnosis not present

## 2018-08-23 DIAGNOSIS — C220 Liver cell carcinoma: Secondary | ICD-10-CM | POA: Diagnosis not present

## 2018-08-23 DIAGNOSIS — R1084 Generalized abdominal pain: Secondary | ICD-10-CM | POA: Diagnosis not present

## 2018-08-23 DIAGNOSIS — R634 Abnormal weight loss: Secondary | ICD-10-CM | POA: Diagnosis not present

## 2018-08-23 DIAGNOSIS — K7460 Unspecified cirrhosis of liver: Secondary | ICD-10-CM | POA: Diagnosis not present

## 2018-08-23 DIAGNOSIS — R630 Anorexia: Secondary | ICD-10-CM | POA: Diagnosis not present

## 2018-08-30 DIAGNOSIS — R630 Anorexia: Secondary | ICD-10-CM | POA: Diagnosis not present

## 2018-08-30 DIAGNOSIS — R1084 Generalized abdominal pain: Secondary | ICD-10-CM | POA: Diagnosis not present

## 2018-08-30 DIAGNOSIS — C220 Liver cell carcinoma: Secondary | ICD-10-CM | POA: Diagnosis not present

## 2018-08-30 DIAGNOSIS — R634 Abnormal weight loss: Secondary | ICD-10-CM | POA: Diagnosis not present

## 2018-08-30 DIAGNOSIS — R180 Malignant ascites: Secondary | ICD-10-CM | POA: Diagnosis not present

## 2018-08-30 DIAGNOSIS — K7460 Unspecified cirrhosis of liver: Secondary | ICD-10-CM | POA: Diagnosis not present

## 2018-09-06 DIAGNOSIS — K7460 Unspecified cirrhosis of liver: Secondary | ICD-10-CM | POA: Diagnosis not present

## 2018-09-06 DIAGNOSIS — R630 Anorexia: Secondary | ICD-10-CM | POA: Diagnosis not present

## 2018-09-06 DIAGNOSIS — R180 Malignant ascites: Secondary | ICD-10-CM | POA: Diagnosis not present

## 2018-09-06 DIAGNOSIS — R1084 Generalized abdominal pain: Secondary | ICD-10-CM | POA: Diagnosis not present

## 2018-09-06 DIAGNOSIS — F419 Anxiety disorder, unspecified: Secondary | ICD-10-CM | POA: Diagnosis not present

## 2018-09-06 DIAGNOSIS — K30 Functional dyspepsia: Secondary | ICD-10-CM | POA: Diagnosis not present

## 2018-09-06 DIAGNOSIS — G894 Chronic pain syndrome: Secondary | ICD-10-CM | POA: Diagnosis not present

## 2018-09-06 DIAGNOSIS — C220 Liver cell carcinoma: Secondary | ICD-10-CM | POA: Diagnosis not present

## 2018-09-06 DIAGNOSIS — R634 Abnormal weight loss: Secondary | ICD-10-CM | POA: Diagnosis not present

## 2018-09-08 DIAGNOSIS — R630 Anorexia: Secondary | ICD-10-CM | POA: Diagnosis not present

## 2018-09-08 DIAGNOSIS — K7460 Unspecified cirrhosis of liver: Secondary | ICD-10-CM | POA: Diagnosis not present

## 2018-09-08 DIAGNOSIS — R1084 Generalized abdominal pain: Secondary | ICD-10-CM | POA: Diagnosis not present

## 2018-09-08 DIAGNOSIS — C220 Liver cell carcinoma: Secondary | ICD-10-CM | POA: Diagnosis not present

## 2018-09-08 DIAGNOSIS — R180 Malignant ascites: Secondary | ICD-10-CM | POA: Diagnosis not present

## 2018-09-08 DIAGNOSIS — R634 Abnormal weight loss: Secondary | ICD-10-CM | POA: Diagnosis not present

## 2018-09-13 DIAGNOSIS — R630 Anorexia: Secondary | ICD-10-CM | POA: Diagnosis not present

## 2018-09-13 DIAGNOSIS — R634 Abnormal weight loss: Secondary | ICD-10-CM | POA: Diagnosis not present

## 2018-09-13 DIAGNOSIS — R180 Malignant ascites: Secondary | ICD-10-CM | POA: Diagnosis not present

## 2018-09-13 DIAGNOSIS — R1084 Generalized abdominal pain: Secondary | ICD-10-CM | POA: Diagnosis not present

## 2018-09-13 DIAGNOSIS — C220 Liver cell carcinoma: Secondary | ICD-10-CM | POA: Diagnosis not present

## 2018-09-13 DIAGNOSIS — K7460 Unspecified cirrhosis of liver: Secondary | ICD-10-CM | POA: Diagnosis not present

## 2018-09-15 DIAGNOSIS — K7469 Other cirrhosis of liver: Secondary | ICD-10-CM | POA: Diagnosis not present

## 2018-09-15 DIAGNOSIS — C220 Liver cell carcinoma: Secondary | ICD-10-CM | POA: Diagnosis not present

## 2018-09-20 DIAGNOSIS — R634 Abnormal weight loss: Secondary | ICD-10-CM | POA: Diagnosis not present

## 2018-09-20 DIAGNOSIS — B1920 Unspecified viral hepatitis C without hepatic coma: Secondary | ICD-10-CM | POA: Diagnosis not present

## 2018-09-20 DIAGNOSIS — R1084 Generalized abdominal pain: Secondary | ICD-10-CM | POA: Diagnosis not present

## 2018-09-20 DIAGNOSIS — K7460 Unspecified cirrhosis of liver: Secondary | ICD-10-CM | POA: Diagnosis not present

## 2018-09-20 DIAGNOSIS — E039 Hypothyroidism, unspecified: Secondary | ICD-10-CM | POA: Diagnosis not present

## 2018-09-20 DIAGNOSIS — R180 Malignant ascites: Secondary | ICD-10-CM | POA: Diagnosis not present

## 2018-09-20 DIAGNOSIS — R630 Anorexia: Secondary | ICD-10-CM | POA: Diagnosis not present

## 2018-09-20 DIAGNOSIS — I10 Essential (primary) hypertension: Secondary | ICD-10-CM | POA: Diagnosis not present

## 2018-09-20 DIAGNOSIS — F329 Major depressive disorder, single episode, unspecified: Secondary | ICD-10-CM | POA: Diagnosis not present

## 2018-09-20 DIAGNOSIS — C220 Liver cell carcinoma: Secondary | ICD-10-CM | POA: Diagnosis not present

## 2018-09-20 DIAGNOSIS — D630 Anemia in neoplastic disease: Secondary | ICD-10-CM | POA: Diagnosis not present

## 2018-09-21 DIAGNOSIS — R1084 Generalized abdominal pain: Secondary | ICD-10-CM | POA: Diagnosis not present

## 2018-09-21 DIAGNOSIS — R630 Anorexia: Secondary | ICD-10-CM | POA: Diagnosis not present

## 2018-09-21 DIAGNOSIS — R634 Abnormal weight loss: Secondary | ICD-10-CM | POA: Diagnosis not present

## 2018-09-21 DIAGNOSIS — C220 Liver cell carcinoma: Secondary | ICD-10-CM | POA: Diagnosis not present

## 2018-09-21 DIAGNOSIS — R180 Malignant ascites: Secondary | ICD-10-CM | POA: Diagnosis not present

## 2018-09-21 DIAGNOSIS — K7460 Unspecified cirrhosis of liver: Secondary | ICD-10-CM | POA: Diagnosis not present

## 2018-09-27 DIAGNOSIS — R634 Abnormal weight loss: Secondary | ICD-10-CM | POA: Diagnosis not present

## 2018-09-27 DIAGNOSIS — R180 Malignant ascites: Secondary | ICD-10-CM | POA: Diagnosis not present

## 2018-09-27 DIAGNOSIS — R630 Anorexia: Secondary | ICD-10-CM | POA: Diagnosis not present

## 2018-09-27 DIAGNOSIS — R1084 Generalized abdominal pain: Secondary | ICD-10-CM | POA: Diagnosis not present

## 2018-09-27 DIAGNOSIS — K7460 Unspecified cirrhosis of liver: Secondary | ICD-10-CM | POA: Diagnosis not present

## 2018-09-27 DIAGNOSIS — C220 Liver cell carcinoma: Secondary | ICD-10-CM | POA: Diagnosis not present

## 2018-10-05 DIAGNOSIS — C220 Liver cell carcinoma: Secondary | ICD-10-CM | POA: Diagnosis not present

## 2018-10-05 DIAGNOSIS — R180 Malignant ascites: Secondary | ICD-10-CM | POA: Diagnosis not present

## 2018-10-05 DIAGNOSIS — R1084 Generalized abdominal pain: Secondary | ICD-10-CM | POA: Diagnosis not present

## 2018-10-05 DIAGNOSIS — K7460 Unspecified cirrhosis of liver: Secondary | ICD-10-CM | POA: Diagnosis not present

## 2018-10-05 DIAGNOSIS — R630 Anorexia: Secondary | ICD-10-CM | POA: Diagnosis not present

## 2018-10-05 DIAGNOSIS — R634 Abnormal weight loss: Secondary | ICD-10-CM | POA: Diagnosis not present

## 2018-10-11 DIAGNOSIS — R180 Malignant ascites: Secondary | ICD-10-CM | POA: Diagnosis not present

## 2018-10-11 DIAGNOSIS — R630 Anorexia: Secondary | ICD-10-CM | POA: Diagnosis not present

## 2018-10-11 DIAGNOSIS — K7460 Unspecified cirrhosis of liver: Secondary | ICD-10-CM | POA: Diagnosis not present

## 2018-10-11 DIAGNOSIS — R1084 Generalized abdominal pain: Secondary | ICD-10-CM | POA: Diagnosis not present

## 2018-10-11 DIAGNOSIS — R634 Abnormal weight loss: Secondary | ICD-10-CM | POA: Diagnosis not present

## 2018-10-11 DIAGNOSIS — C220 Liver cell carcinoma: Secondary | ICD-10-CM | POA: Diagnosis not present

## 2018-10-15 DIAGNOSIS — Z23 Encounter for immunization: Secondary | ICD-10-CM | POA: Diagnosis not present

## 2018-10-19 ENCOUNTER — Encounter: Payer: Self-pay | Admitting: Internal Medicine

## 2018-10-19 DIAGNOSIS — C220 Liver cell carcinoma: Secondary | ICD-10-CM | POA: Diagnosis not present

## 2018-10-19 DIAGNOSIS — R1084 Generalized abdominal pain: Secondary | ICD-10-CM | POA: Diagnosis not present

## 2018-10-19 DIAGNOSIS — R180 Malignant ascites: Secondary | ICD-10-CM | POA: Diagnosis not present

## 2018-10-19 DIAGNOSIS — R634 Abnormal weight loss: Secondary | ICD-10-CM | POA: Diagnosis not present

## 2018-10-19 DIAGNOSIS — R630 Anorexia: Secondary | ICD-10-CM | POA: Diagnosis not present

## 2018-10-19 DIAGNOSIS — K7460 Unspecified cirrhosis of liver: Secondary | ICD-10-CM | POA: Diagnosis not present

## 2018-10-20 DIAGNOSIS — F329 Major depressive disorder, single episode, unspecified: Secondary | ICD-10-CM | POA: Diagnosis not present

## 2018-10-20 DIAGNOSIS — B1920 Unspecified viral hepatitis C without hepatic coma: Secondary | ICD-10-CM | POA: Diagnosis not present

## 2018-10-20 DIAGNOSIS — R180 Malignant ascites: Secondary | ICD-10-CM | POA: Diagnosis not present

## 2018-10-20 DIAGNOSIS — R630 Anorexia: Secondary | ICD-10-CM | POA: Diagnosis not present

## 2018-10-20 DIAGNOSIS — E039 Hypothyroidism, unspecified: Secondary | ICD-10-CM | POA: Diagnosis not present

## 2018-10-20 DIAGNOSIS — D630 Anemia in neoplastic disease: Secondary | ICD-10-CM | POA: Diagnosis not present

## 2018-10-20 DIAGNOSIS — C220 Liver cell carcinoma: Secondary | ICD-10-CM | POA: Diagnosis not present

## 2018-10-20 DIAGNOSIS — I10 Essential (primary) hypertension: Secondary | ICD-10-CM | POA: Diagnosis not present

## 2018-10-20 DIAGNOSIS — R112 Nausea with vomiting, unspecified: Secondary | ICD-10-CM | POA: Diagnosis not present

## 2018-10-20 DIAGNOSIS — R1084 Generalized abdominal pain: Secondary | ICD-10-CM | POA: Diagnosis not present

## 2018-10-20 DIAGNOSIS — R634 Abnormal weight loss: Secondary | ICD-10-CM | POA: Diagnosis not present

## 2018-10-20 DIAGNOSIS — K7460 Unspecified cirrhosis of liver: Secondary | ICD-10-CM | POA: Diagnosis not present

## 2018-10-21 DIAGNOSIS — F419 Anxiety disorder, unspecified: Secondary | ICD-10-CM | POA: Diagnosis not present

## 2018-10-21 DIAGNOSIS — R180 Malignant ascites: Secondary | ICD-10-CM | POA: Diagnosis not present

## 2018-10-21 DIAGNOSIS — G894 Chronic pain syndrome: Secondary | ICD-10-CM | POA: Diagnosis not present

## 2018-10-25 DIAGNOSIS — R630 Anorexia: Secondary | ICD-10-CM | POA: Diagnosis not present

## 2018-10-25 DIAGNOSIS — R634 Abnormal weight loss: Secondary | ICD-10-CM | POA: Diagnosis not present

## 2018-10-25 DIAGNOSIS — R1084 Generalized abdominal pain: Secondary | ICD-10-CM | POA: Diagnosis not present

## 2018-10-25 DIAGNOSIS — C220 Liver cell carcinoma: Secondary | ICD-10-CM | POA: Diagnosis not present

## 2018-10-25 DIAGNOSIS — K7460 Unspecified cirrhosis of liver: Secondary | ICD-10-CM | POA: Diagnosis not present

## 2018-10-25 DIAGNOSIS — R180 Malignant ascites: Secondary | ICD-10-CM | POA: Diagnosis not present

## 2018-10-29 DIAGNOSIS — R630 Anorexia: Secondary | ICD-10-CM | POA: Diagnosis not present

## 2018-10-29 DIAGNOSIS — C220 Liver cell carcinoma: Secondary | ICD-10-CM | POA: Diagnosis not present

## 2018-10-29 DIAGNOSIS — R1084 Generalized abdominal pain: Secondary | ICD-10-CM | POA: Diagnosis not present

## 2018-10-29 DIAGNOSIS — K7460 Unspecified cirrhosis of liver: Secondary | ICD-10-CM | POA: Diagnosis not present

## 2018-10-29 DIAGNOSIS — R634 Abnormal weight loss: Secondary | ICD-10-CM | POA: Diagnosis not present

## 2018-10-29 DIAGNOSIS — R180 Malignant ascites: Secondary | ICD-10-CM | POA: Diagnosis not present

## 2018-11-01 DIAGNOSIS — C220 Liver cell carcinoma: Secondary | ICD-10-CM | POA: Diagnosis not present

## 2018-11-01 DIAGNOSIS — R180 Malignant ascites: Secondary | ICD-10-CM | POA: Diagnosis not present

## 2018-11-01 DIAGNOSIS — R630 Anorexia: Secondary | ICD-10-CM | POA: Diagnosis not present

## 2018-11-01 DIAGNOSIS — R1084 Generalized abdominal pain: Secondary | ICD-10-CM | POA: Diagnosis not present

## 2018-11-01 DIAGNOSIS — K7460 Unspecified cirrhosis of liver: Secondary | ICD-10-CM | POA: Diagnosis not present

## 2018-11-01 DIAGNOSIS — R634 Abnormal weight loss: Secondary | ICD-10-CM | POA: Diagnosis not present

## 2018-11-02 DIAGNOSIS — R634 Abnormal weight loss: Secondary | ICD-10-CM | POA: Diagnosis not present

## 2018-11-02 DIAGNOSIS — K7460 Unspecified cirrhosis of liver: Secondary | ICD-10-CM | POA: Diagnosis not present

## 2018-11-02 DIAGNOSIS — R630 Anorexia: Secondary | ICD-10-CM | POA: Diagnosis not present

## 2018-11-02 DIAGNOSIS — R1084 Generalized abdominal pain: Secondary | ICD-10-CM | POA: Diagnosis not present

## 2018-11-02 DIAGNOSIS — C220 Liver cell carcinoma: Secondary | ICD-10-CM | POA: Diagnosis not present

## 2018-11-02 DIAGNOSIS — R180 Malignant ascites: Secondary | ICD-10-CM | POA: Diagnosis not present

## 2018-11-08 DIAGNOSIS — K7460 Unspecified cirrhosis of liver: Secondary | ICD-10-CM | POA: Diagnosis not present

## 2018-11-08 DIAGNOSIS — R630 Anorexia: Secondary | ICD-10-CM | POA: Diagnosis not present

## 2018-11-08 DIAGNOSIS — C220 Liver cell carcinoma: Secondary | ICD-10-CM | POA: Diagnosis not present

## 2018-11-08 DIAGNOSIS — R180 Malignant ascites: Secondary | ICD-10-CM | POA: Diagnosis not present

## 2018-11-08 DIAGNOSIS — R1084 Generalized abdominal pain: Secondary | ICD-10-CM | POA: Diagnosis not present

## 2018-11-08 DIAGNOSIS — R634 Abnormal weight loss: Secondary | ICD-10-CM | POA: Diagnosis not present

## 2018-11-20 DIAGNOSIS — D630 Anemia in neoplastic disease: Secondary | ICD-10-CM | POA: Diagnosis not present

## 2018-11-20 DIAGNOSIS — K7460 Unspecified cirrhosis of liver: Secondary | ICD-10-CM | POA: Diagnosis not present

## 2018-11-20 DIAGNOSIS — I10 Essential (primary) hypertension: Secondary | ICD-10-CM | POA: Diagnosis not present

## 2018-11-20 DIAGNOSIS — C220 Liver cell carcinoma: Secondary | ICD-10-CM | POA: Diagnosis not present

## 2018-11-20 DIAGNOSIS — R630 Anorexia: Secondary | ICD-10-CM | POA: Diagnosis not present

## 2018-11-20 DIAGNOSIS — R112 Nausea with vomiting, unspecified: Secondary | ICD-10-CM | POA: Diagnosis not present

## 2018-11-20 DIAGNOSIS — R634 Abnormal weight loss: Secondary | ICD-10-CM | POA: Diagnosis not present

## 2018-11-20 DIAGNOSIS — E039 Hypothyroidism, unspecified: Secondary | ICD-10-CM | POA: Diagnosis not present

## 2018-11-20 DIAGNOSIS — R1084 Generalized abdominal pain: Secondary | ICD-10-CM | POA: Diagnosis not present

## 2018-11-20 DIAGNOSIS — R180 Malignant ascites: Secondary | ICD-10-CM | POA: Diagnosis not present

## 2018-11-20 DIAGNOSIS — B1920 Unspecified viral hepatitis C without hepatic coma: Secondary | ICD-10-CM | POA: Diagnosis not present

## 2018-11-20 DIAGNOSIS — F329 Major depressive disorder, single episode, unspecified: Secondary | ICD-10-CM | POA: Diagnosis not present

## 2018-11-22 DIAGNOSIS — R1084 Generalized abdominal pain: Secondary | ICD-10-CM | POA: Diagnosis not present

## 2018-11-22 DIAGNOSIS — C220 Liver cell carcinoma: Secondary | ICD-10-CM | POA: Diagnosis not present

## 2018-11-22 DIAGNOSIS — R634 Abnormal weight loss: Secondary | ICD-10-CM | POA: Diagnosis not present

## 2018-11-22 DIAGNOSIS — R180 Malignant ascites: Secondary | ICD-10-CM | POA: Diagnosis not present

## 2018-11-22 DIAGNOSIS — K7460 Unspecified cirrhosis of liver: Secondary | ICD-10-CM | POA: Diagnosis not present

## 2018-11-22 DIAGNOSIS — R630 Anorexia: Secondary | ICD-10-CM | POA: Diagnosis not present

## 2018-11-30 DIAGNOSIS — C220 Liver cell carcinoma: Secondary | ICD-10-CM | POA: Diagnosis not present

## 2018-11-30 DIAGNOSIS — R1084 Generalized abdominal pain: Secondary | ICD-10-CM | POA: Diagnosis not present

## 2018-11-30 DIAGNOSIS — R630 Anorexia: Secondary | ICD-10-CM | POA: Diagnosis not present

## 2018-11-30 DIAGNOSIS — K7460 Unspecified cirrhosis of liver: Secondary | ICD-10-CM | POA: Diagnosis not present

## 2018-11-30 DIAGNOSIS — R180 Malignant ascites: Secondary | ICD-10-CM | POA: Diagnosis not present

## 2018-11-30 DIAGNOSIS — R634 Abnormal weight loss: Secondary | ICD-10-CM | POA: Diagnosis not present

## 2018-12-05 DIAGNOSIS — R634 Abnormal weight loss: Secondary | ICD-10-CM | POA: Diagnosis not present

## 2018-12-05 DIAGNOSIS — K7460 Unspecified cirrhosis of liver: Secondary | ICD-10-CM | POA: Diagnosis not present

## 2018-12-05 DIAGNOSIS — R180 Malignant ascites: Secondary | ICD-10-CM | POA: Diagnosis not present

## 2018-12-05 DIAGNOSIS — R630 Anorexia: Secondary | ICD-10-CM | POA: Diagnosis not present

## 2018-12-05 DIAGNOSIS — R1084 Generalized abdominal pain: Secondary | ICD-10-CM | POA: Diagnosis not present

## 2018-12-05 DIAGNOSIS — C220 Liver cell carcinoma: Secondary | ICD-10-CM | POA: Diagnosis not present

## 2018-12-06 DIAGNOSIS — K7460 Unspecified cirrhosis of liver: Secondary | ICD-10-CM | POA: Diagnosis not present

## 2018-12-06 DIAGNOSIS — R630 Anorexia: Secondary | ICD-10-CM | POA: Diagnosis not present

## 2018-12-06 DIAGNOSIS — R1084 Generalized abdominal pain: Secondary | ICD-10-CM | POA: Diagnosis not present

## 2018-12-06 DIAGNOSIS — C220 Liver cell carcinoma: Secondary | ICD-10-CM | POA: Diagnosis not present

## 2018-12-06 DIAGNOSIS — R634 Abnormal weight loss: Secondary | ICD-10-CM | POA: Diagnosis not present

## 2018-12-06 DIAGNOSIS — R180 Malignant ascites: Secondary | ICD-10-CM | POA: Diagnosis not present

## 2018-12-13 DIAGNOSIS — R630 Anorexia: Secondary | ICD-10-CM | POA: Diagnosis not present

## 2018-12-13 DIAGNOSIS — C220 Liver cell carcinoma: Secondary | ICD-10-CM | POA: Diagnosis not present

## 2018-12-13 DIAGNOSIS — K7460 Unspecified cirrhosis of liver: Secondary | ICD-10-CM | POA: Diagnosis not present

## 2018-12-13 DIAGNOSIS — R634 Abnormal weight loss: Secondary | ICD-10-CM | POA: Diagnosis not present

## 2018-12-13 DIAGNOSIS — R1084 Generalized abdominal pain: Secondary | ICD-10-CM | POA: Diagnosis not present

## 2018-12-13 DIAGNOSIS — R180 Malignant ascites: Secondary | ICD-10-CM | POA: Diagnosis not present

## 2019-01-20 DIAGNOSIS — I10 Essential (primary) hypertension: Secondary | ICD-10-CM | POA: Diagnosis not present

## 2019-01-20 DIAGNOSIS — R630 Anorexia: Secondary | ICD-10-CM | POA: Diagnosis not present

## 2019-01-20 DIAGNOSIS — E039 Hypothyroidism, unspecified: Secondary | ICD-10-CM | POA: Diagnosis not present

## 2019-01-20 DIAGNOSIS — D630 Anemia in neoplastic disease: Secondary | ICD-10-CM | POA: Diagnosis not present

## 2019-01-20 DIAGNOSIS — K7460 Unspecified cirrhosis of liver: Secondary | ICD-10-CM | POA: Diagnosis not present

## 2019-01-20 DIAGNOSIS — C220 Liver cell carcinoma: Secondary | ICD-10-CM | POA: Diagnosis not present

## 2019-01-20 DIAGNOSIS — R180 Malignant ascites: Secondary | ICD-10-CM | POA: Diagnosis not present

## 2019-01-20 DIAGNOSIS — R1011 Right upper quadrant pain: Secondary | ICD-10-CM | POA: Diagnosis not present

## 2019-01-20 DIAGNOSIS — R112 Nausea with vomiting, unspecified: Secondary | ICD-10-CM | POA: Diagnosis not present

## 2019-01-20 DIAGNOSIS — F329 Major depressive disorder, single episode, unspecified: Secondary | ICD-10-CM | POA: Diagnosis not present

## 2019-01-20 DIAGNOSIS — R1084 Generalized abdominal pain: Secondary | ICD-10-CM | POA: Diagnosis not present

## 2019-01-20 DIAGNOSIS — R634 Abnormal weight loss: Secondary | ICD-10-CM | POA: Diagnosis not present

## 2019-01-20 DIAGNOSIS — B1920 Unspecified viral hepatitis C without hepatic coma: Secondary | ICD-10-CM | POA: Diagnosis not present

## 2019-01-24 DIAGNOSIS — C220 Liver cell carcinoma: Secondary | ICD-10-CM | POA: Diagnosis not present

## 2019-01-24 DIAGNOSIS — R180 Malignant ascites: Secondary | ICD-10-CM | POA: Diagnosis not present

## 2019-01-24 DIAGNOSIS — R1084 Generalized abdominal pain: Secondary | ICD-10-CM | POA: Diagnosis not present

## 2019-01-24 DIAGNOSIS — R630 Anorexia: Secondary | ICD-10-CM | POA: Diagnosis not present

## 2019-01-24 DIAGNOSIS — R1011 Right upper quadrant pain: Secondary | ICD-10-CM | POA: Diagnosis not present

## 2019-01-24 DIAGNOSIS — R634 Abnormal weight loss: Secondary | ICD-10-CM | POA: Diagnosis not present

## 2019-01-31 DIAGNOSIS — R634 Abnormal weight loss: Secondary | ICD-10-CM | POA: Diagnosis not present

## 2019-01-31 DIAGNOSIS — C220 Liver cell carcinoma: Secondary | ICD-10-CM | POA: Diagnosis not present

## 2019-01-31 DIAGNOSIS — R180 Malignant ascites: Secondary | ICD-10-CM | POA: Diagnosis not present

## 2019-01-31 DIAGNOSIS — R1011 Right upper quadrant pain: Secondary | ICD-10-CM | POA: Diagnosis not present

## 2019-01-31 DIAGNOSIS — R1084 Generalized abdominal pain: Secondary | ICD-10-CM | POA: Diagnosis not present

## 2019-01-31 DIAGNOSIS — R630 Anorexia: Secondary | ICD-10-CM | POA: Diagnosis not present

## 2019-02-09 DIAGNOSIS — R1084 Generalized abdominal pain: Secondary | ICD-10-CM | POA: Diagnosis not present

## 2019-02-09 DIAGNOSIS — R634 Abnormal weight loss: Secondary | ICD-10-CM | POA: Diagnosis not present

## 2019-02-09 DIAGNOSIS — R1011 Right upper quadrant pain: Secondary | ICD-10-CM | POA: Diagnosis not present

## 2019-02-09 DIAGNOSIS — R630 Anorexia: Secondary | ICD-10-CM | POA: Diagnosis not present

## 2019-02-09 DIAGNOSIS — C220 Liver cell carcinoma: Secondary | ICD-10-CM | POA: Diagnosis not present

## 2019-02-09 DIAGNOSIS — R180 Malignant ascites: Secondary | ICD-10-CM | POA: Diagnosis not present

## 2019-02-11 DIAGNOSIS — R627 Adult failure to thrive: Secondary | ICD-10-CM | POA: Diagnosis not present

## 2019-02-11 DIAGNOSIS — F411 Generalized anxiety disorder: Secondary | ICD-10-CM | POA: Diagnosis not present

## 2019-02-11 DIAGNOSIS — G894 Chronic pain syndrome: Secondary | ICD-10-CM | POA: Diagnosis not present

## 2019-02-11 DIAGNOSIS — F419 Anxiety disorder, unspecified: Secondary | ICD-10-CM | POA: Diagnosis not present

## 2019-02-12 ENCOUNTER — Other Ambulatory Visit: Payer: Self-pay

## 2019-02-12 ENCOUNTER — Emergency Department (HOSPITAL_BASED_OUTPATIENT_CLINIC_OR_DEPARTMENT_OTHER)

## 2019-02-12 ENCOUNTER — Encounter (HOSPITAL_BASED_OUTPATIENT_CLINIC_OR_DEPARTMENT_OTHER): Payer: Self-pay | Admitting: Emergency Medicine

## 2019-02-12 ENCOUNTER — Emergency Department (HOSPITAL_BASED_OUTPATIENT_CLINIC_OR_DEPARTMENT_OTHER)
Admission: EM | Admit: 2019-02-12 | Discharge: 2019-02-12 | Disposition: A | Discharge status: Home / Self Care | Attending: Emergency Medicine | Admitting: Emergency Medicine

## 2019-02-12 DIAGNOSIS — R630 Anorexia: Secondary | ICD-10-CM | POA: Diagnosis not present

## 2019-02-12 DIAGNOSIS — Z7401 Bed confinement status: Secondary | ICD-10-CM | POA: Diagnosis not present

## 2019-02-12 DIAGNOSIS — Z7901 Long term (current) use of anticoagulants: Secondary | ICD-10-CM | POA: Diagnosis not present

## 2019-02-12 DIAGNOSIS — R52 Pain, unspecified: Secondary | ICD-10-CM | POA: Diagnosis not present

## 2019-02-12 DIAGNOSIS — W19XXXA Unspecified fall, initial encounter: Secondary | ICD-10-CM | POA: Diagnosis not present

## 2019-02-12 DIAGNOSIS — Z79899 Other long term (current) drug therapy: Secondary | ICD-10-CM | POA: Diagnosis not present

## 2019-02-12 DIAGNOSIS — S0990XA Unspecified injury of head, initial encounter: Secondary | ICD-10-CM | POA: Insufficient documentation

## 2019-02-12 DIAGNOSIS — R634 Abnormal weight loss: Secondary | ICD-10-CM | POA: Diagnosis not present

## 2019-02-12 DIAGNOSIS — Z8505 Personal history of malignant neoplasm of liver: Secondary | ICD-10-CM | POA: Insufficient documentation

## 2019-02-12 DIAGNOSIS — I10 Essential (primary) hypertension: Secondary | ICD-10-CM | POA: Diagnosis not present

## 2019-02-12 DIAGNOSIS — W08XXXA Fall from other furniture, initial encounter: Secondary | ICD-10-CM | POA: Insufficient documentation

## 2019-02-12 DIAGNOSIS — S6992XA Unspecified injury of left wrist, hand and finger(s), initial encounter: Secondary | ICD-10-CM | POA: Diagnosis present

## 2019-02-12 DIAGNOSIS — Z8673 Personal history of transient ischemic attack (TIA), and cerebral infarction without residual deficits: Secondary | ICD-10-CM | POA: Insufficient documentation

## 2019-02-12 DIAGNOSIS — M2550 Pain in unspecified joint: Secondary | ICD-10-CM | POA: Diagnosis not present

## 2019-02-12 DIAGNOSIS — Y9389 Activity, other specified: Secondary | ICD-10-CM | POA: Diagnosis not present

## 2019-02-12 DIAGNOSIS — R1084 Generalized abdominal pain: Secondary | ICD-10-CM | POA: Diagnosis not present

## 2019-02-12 DIAGNOSIS — E039 Hypothyroidism, unspecified: Secondary | ICD-10-CM | POA: Insufficient documentation

## 2019-02-12 DIAGNOSIS — D649 Anemia, unspecified: Secondary | ICD-10-CM | POA: Diagnosis not present

## 2019-02-12 DIAGNOSIS — R180 Malignant ascites: Secondary | ICD-10-CM | POA: Diagnosis not present

## 2019-02-12 DIAGNOSIS — S62102A Fracture of unspecified carpal bone, left wrist, initial encounter for closed fracture: Secondary | ICD-10-CM | POA: Insufficient documentation

## 2019-02-12 DIAGNOSIS — Y998 Other external cause status: Secondary | ICD-10-CM | POA: Insufficient documentation

## 2019-02-12 DIAGNOSIS — R0902 Hypoxemia: Secondary | ICD-10-CM | POA: Diagnosis not present

## 2019-02-12 DIAGNOSIS — Z9221 Personal history of antineoplastic chemotherapy: Secondary | ICD-10-CM | POA: Insufficient documentation

## 2019-02-12 DIAGNOSIS — R1011 Right upper quadrant pain: Secondary | ICD-10-CM | POA: Diagnosis not present

## 2019-02-12 DIAGNOSIS — C220 Liver cell carcinoma: Secondary | ICD-10-CM | POA: Diagnosis not present

## 2019-02-12 DIAGNOSIS — F17210 Nicotine dependence, cigarettes, uncomplicated: Secondary | ICD-10-CM | POA: Diagnosis not present

## 2019-02-12 DIAGNOSIS — Y92018 Other place in single-family (private) house as the place of occurrence of the external cause: Secondary | ICD-10-CM | POA: Diagnosis not present

## 2019-02-12 LAB — OCCULT BLOOD X 1 CARD TO LAB, STOOL: Fecal Occult Bld: NEGATIVE

## 2019-02-12 LAB — CBC WITH DIFFERENTIAL/PLATELET
Abs Immature Granulocytes: 0.06 10*3/uL (ref 0.00–0.07)
Basophils Absolute: 0 10*3/uL (ref 0.0–0.1)
Basophils Relative: 0 %
Eosinophils Absolute: 0 10*3/uL (ref 0.0–0.5)
Eosinophils Relative: 0 %
HCT: 24.6 % — ABNORMAL LOW (ref 39.0–52.0)
Hemoglobin: 7.5 g/dL — ABNORMAL LOW (ref 13.0–17.0)
Immature Granulocytes: 1 %
Lymphocytes Relative: 16 %
Lymphs Abs: 1.3 10*3/uL (ref 0.7–4.0)
MCH: 25.1 pg — ABNORMAL LOW (ref 26.0–34.0)
MCHC: 30.5 g/dL (ref 30.0–36.0)
MCV: 82.3 fL (ref 80.0–100.0)
Monocytes Absolute: 0.7 10*3/uL (ref 0.1–1.0)
Monocytes Relative: 8 %
Neutro Abs: 5.9 10*3/uL (ref 1.7–7.7)
Neutrophils Relative %: 75 %
Platelets: 199 10*3/uL (ref 150–400)
RBC: 2.99 MIL/uL — ABNORMAL LOW (ref 4.22–5.81)
RDW: 15.9 % — ABNORMAL HIGH (ref 11.5–15.5)
WBC: 8 10*3/uL (ref 4.0–10.5)
nRBC: 0 % (ref 0.0–0.2)

## 2019-02-12 LAB — COMPREHENSIVE METABOLIC PANEL
ALT: 13 U/L (ref 0–44)
AST: 32 U/L (ref 15–41)
Albumin: 4 g/dL (ref 3.5–5.0)
Alkaline Phosphatase: 101 U/L (ref 38–126)
Anion gap: 7 (ref 5–15)
BUN: 20 mg/dL (ref 8–23)
CO2: 22 mmol/L (ref 22–32)
Calcium: 8.9 mg/dL (ref 8.9–10.3)
Chloride: 108 mmol/L (ref 98–111)
Creatinine, Ser: 1.48 mg/dL — ABNORMAL HIGH (ref 0.61–1.24)
GFR calc Af Amer: 56 mL/min — ABNORMAL LOW (ref >60)
GFR calc non Af Amer: 48 mL/min — ABNORMAL LOW (ref >60)
Glucose, Bld: 142 mg/dL — ABNORMAL HIGH (ref 70–99)
Potassium: 4.3 mmol/L (ref 3.5–5.1)
Sodium: 137 mmol/L (ref 135–145)
Total Bilirubin: 0.9 mg/dL (ref 0.3–1.2)
Total Protein: 7.5 g/dL (ref 6.5–8.1)

## 2019-02-12 LAB — AMMONIA: Ammonia: 21 umol/L (ref 9–35)

## 2019-02-12 MED ORDER — PROPOFOL 10 MG/ML IV BOLUS
INTRAVENOUS | Status: AC | PRN
  Administered 2019-02-12: 40 mg via INTRAVENOUS

## 2019-02-12 MED ORDER — PROPOFOL 10 MG/ML IV BOLUS
0.5000 mg/kg | Freq: Once | INTRAVENOUS | Status: AC
  Administered 2019-02-12: 07:00:00 37.4 mg via INTRAVENOUS
  Filled 2019-02-12: qty 20

## 2019-02-12 NOTE — ED Notes (Signed)
Pt is a poor historian, unable to provide his current medication list.

## 2019-02-12 NOTE — ED Notes (Signed)
Patient transported to CT 

## 2019-02-12 NOTE — ED Notes (Signed)
[facility] MD notified pt wife about discharge and being transported home by [facility]. Wife reports that she will notify patients hospice RN about recent visit to ED.

## 2019-02-12 NOTE — ED Notes (Signed)
ED Provider at bedside. 

## 2019-02-12 NOTE — ED Provider Notes (Signed)
[facility] Provider Note   CSN: [id number] Arrival date & time: 02/12/19  0431     History Chief Complaint  Patient presents with  . Fall    [facility] is a 69 y.o. male.  Patient is a 70 year old male with past medical history of hepatitis C, hypertension, liver cancer.  He presents today for evaluation of left wrist pain.  Patient apparently fell off the couch and injured himself.  He denies other injury.  He is complaining of severe pain in the left wrist.  The history is provided by the patient.  Fall This is a new problem. The current episode started less than 1 hour ago. The problem occurs constantly. The problem has not changed since onset.Exacerbated by: Movement and palpation. Nothing relieves the symptoms. He has tried nothing for the symptoms.       Past Medical History:  Diagnosis Date  . Cancer ([place])    liver  . Cholelithiasis   . Chronic hepatitis C ([place])   . Chronic knee pain   . Chronic left shoulder pain   . CVA (cerebral infarction)   . Gout   . Hepatitis C    genotype 1b.  pt has been vaccinated against hep A and B.  . Hypertension   . Osteoarthritis   . Schatzki's ring   . Tubular adenoma of colon 12/2011    Patient Active Problem List   Diagnosis Date Noted  . Hypothyroidism 03/14/2017  . Spasm of muscle of lower back 11/05/2016  . Iron deficiency anemia due to chronic blood loss 10/16/2016  . Anemia due to antineoplastic chemotherapy 10/08/2016  . Goals of care, counseling/discussion 01/28/2016  . 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 Cancer related pain 10/25/2015  . Liver cirrhosis ([place]) 10/25/2015  . Hepatocellular carcinoma ([place]) 04/18/2013  . Unspecified constipation 03/01/2012  . Tubular adenoma of colon 03/01/2012  . Hx of hepatitis C 12/16/2011  . Elevated LFTs 12/16/2011    Past Surgical History:  Procedure Laterality Date  . AMPUTATION Left 09/17/2012   Procedure: SMALL FINGER EXTENSOR TENDON REPAIR; METACARPAL LEVEL AMPUTATION RING FINGER; PROXIMAL PHALANX LEVEL AMPUTATION LONG FINGER;  Surgeon: Schuyler Amor, MD;  Location: [facility];  Service: Orthopedics;  Laterality: Left;  . Arm surgery     right/plate in arm  . BIOPSY  11/02/2016   Procedure: BIOPSY;  Surgeon: Daneil Dolin, MD;  [facility];  Service: Endoscopy;;  gastric   . COLONOSCOPY WITH ESOPHAGOGASTRODUODENOSCOPY (EGD)  12/30/2011   RMR: Noncritical Schatzki's ring;  Hiatal hernia, Tubular ADENOMA removed from splenic flexure, otherwise normal colonoscopy  . COLONOSCOPY WITH PROPOFOL N/A 11/02/2016   Procedure: COLONOSCOPY WITH PROPOFOL;  Surgeon: Daneil Dolin, MD;  [facility];  Service: Endoscopy;  Laterality: N/A;  1045  . ESOPHAGOGASTRODUODENOSCOPY (EGD) WITH PROPOFOL N/A 11/02/2016   Procedure: ESOPHAGOGASTRODUODENOSCOPY (EGD) WITH PROPOFOL;  Surgeon: Daneil Dolin, MD;  [facility];  Service: Endoscopy;  Laterality: N/A;  . IR FLUORO GUIDE PORT INSERTION RIGHT  10/21/2016  . IR US GUIDE VASC ACCESS RIGHT  10/21/2016  . LEG SURGERY     left  . POLYPECTOMY  11/02/2016   Procedure: POLYPECTOMY;  Surgeon: Daneil Dolin, MD;  [facility];  Service: Endoscopy;;  colon  . WOUND EXPLORATION Left 09/17/2012   Procedure: WOUND EXPLORATION;  Surgeon: Schuyler Amor, MD;  Location: [facility];  Service: Orthopedics;  Laterality: Left;  Family History  Problem Relation Age of Onset  . Cirrhosis Father 59  . Cancer Father        liver cancer   . Lung cancer Mother 15  . Colon cancer Neg Hx     Social History   Tobacco Use  . Smoking status: Current Some Day Smoker    Packs/day: 0.50    Years: 40.00    Pack years: 20.00    Types: Cigarettes    Last attempt to quit: 01/19/2012    Years since quitting: 7.0  . Smokeless tobacco: Never Used  . Tobacco comment: Smokes 1/2 pack of cigarettes daily  Substance Use  Topics  . Alcohol use: No    Comment: HX 2 beers 3-4 days per week; QUIT DEC 2013  . Drug use: No    Comment: Hx cocaine yrs ago, Last marijuana OCT 2013    Home Medications Prior to Admission medications   Medication Sig Start Date End Date Taking? Authorizing Provider  ALPRAZolam Duanne Moron) 1 MG tablet Take 1 tablet (1 mg total) by mouth 2 (two) times daily as needed for anxiety. 10/22/17   Truitt Merle, MD  furosemide (LASIX) 20 MG tablet Take 20 mg by mouth daily.    [provider]  hydrALAZINE (APRESOLINE) 25 MG tablet Take by mouth daily.  08/21/15   [provider]  lactulose (CHRONULAC) 10 GM/15ML solution Take 30 g by mouth daily as needed for moderate constipation.  01/06/16   [provider]  lidocaine-prilocaine (EMLA) cream Apply 1 application topically as needed. Apply to portacath 1 1/2 hours - 2 hours prior to procedures as needed. 10/26/16   Truitt Merle, MD  megestrol (MEGACE) 40 MG/ML suspension Take 400 mg by mouth daily.  10/07/15   [provider]  metoprolol tartrate (LOPRESSOR) 100 MG tablet Take 100 mg by mouth every morning.    [provider]  mirtazapine (REMERON) 7.5 MG tablet Take 1 tablet (7.5 mg total) by mouth at bedtime. 08/27/17   Truitt Merle, MD  Multiple Vitamins-Minerals (CENTRUM SILVER PO) Take 1 tablet by mouth daily.     [provider]  potassium chloride SA (K-DUR,KLOR-CON) 20 MEQ tablet Take 1 tablet (20 mEq total) by mouth daily. Take 20 meq by mouth daily for 7 days. 09/06/17   Truitt Merle, MD  prochlorperazine (COMPAZINE) 10 MG tablet TAKE 1 TABLET BY MOUTH EVERY 8 HOURS AS NEEDED FOR NAUSEA/VOMITING 12/31/17   Truitt Merle, MD  rivaroxaban (XARELTO) 20 MG TABS tablet TAKE 1 TABLET BY MOUTH DAILY WITH SUPPER. 07/20/17   Truitt Merle, MD    Allergies    Patient has no known allergies.  Review of Systems   Review of Systems  All other systems reviewed and are negative.   Physical Exam Updated Vital Signs BP (!)  181/87 (BP Location: Right Arm)   Pulse 87   Temp 97.9 F (36.6 C) (Oral)   Resp 18   Ht 6\' 1"  (1.854 m)   Wt 74.8 kg   SpO2 98%   BMI 21.77 kg/m   Physical Exam Vitals and nursing note reviewed.  Constitutional:      General: He is not in acute distress.    Appearance: Normal appearance. He is not ill-appearing.  HENT:     Head: Normocephalic and atraumatic.  Cardiovascular:     Rate and Rhythm: Normal rate and regular rhythm.  Pulmonary:     Effort: Pulmonary effort is normal.  Genitourinary:    Rectum: Normal.  Comments: There is brown, nonmelanotic and nonbloody stool in the rectal vault. Musculoskeletal:     Cervical back: Normal range of motion.     Comments: The left wrist has obvious deformity.  He does have sensation to all fingertips and motor is intact.  Skin:    General: Skin is warm and dry.  Neurological:     Mental Status: He is alert.     ED Results / Procedures / Treatments   Labs (all labs ordered are listed, but only abnormal results are displayed) Labs Reviewed - No data to display  EKG None  Radiology No results found.  Procedures .Sedation  Date/Time: 02/12/2019 6:56 AM Performed by: Veryl Speak, MD Authorized by: Veryl Speak, MD   Consent:    Consent obtained:  Verbal   Consent given by:  Patient   Risks discussed:  Allergic reaction, dysrhythmia, inadequate sedation, nausea, prolonged hypoxia resulting in organ damage, prolonged sedation necessitating reversal, respiratory compromise necessitating ventilatory assistance and intubation and vomiting   Alternatives discussed:  Analgesia without sedation, anxiolysis and regional anesthesia Universal protocol:    Procedure explained and questions answered to patient or proxy's satisfaction: yes     Relevant documents present and verified: yes     Test results available and properly labeled: yes     Imaging studies available: yes     Required blood products, implants, devices, and  special equipment available: yes     Site/side marked: yes     Immediately prior to procedure a time out was called: yes     Patient identity confirmation method:  Verbally with patient Indications:    Procedure necessitating sedation performed by:  Physician performing sedation Pre-sedation assessment:    Time since last food or drink:  8 hours   ASA classification: class 1 - normal, healthy patient     Neck mobility: normal     Mouth opening:  3 or more finger widths   Thyromental distance:  4 finger widths   Mallampati score:  I - soft palate, uvula, fauces, pillars visible   Pre-sedation assessments completed and reviewed: airway patency, cardiovascular function, hydration status, mental status, nausea/vomiting, pain level, respiratory function and temperature   Immediate pre-procedure details:    Reassessment: Patient reassessed immediately prior to procedure     Reviewed: vital signs, relevant labs/tests and NPO status     Verified: bag valve mask available, emergency equipment available, intubation equipment available, IV patency confirmed, oxygen available and suction available   Procedure details (see MAR for exact dosages):    Preoxygenation:  Nasal cannula   Sedation:  Propofol   Intended level of sedation: deep   Intra-procedure monitoring:  Blood pressure monitoring, cardiac monitor, continuous pulse oximetry, frequent LOC assessments, frequent vital sign checks and continuous capnometry   Intra-procedure events: none     Total Provider sedation time (minutes):  15 Post-procedure details:    Attendance: Constant attendance by certified staff until patient recovered     Recovery: Patient returned to pre-procedure baseline     Post-sedation assessments completed and reviewed: airway patency, cardiovascular function, hydration status, mental status, nausea/vomiting, pain level, respiratory function and temperature     Patient is stable for discharge or admission: yes      Patient tolerance:  Tolerated well, no immediate complications Comments:     Patient achieved good sedation with 80 mg of propofol.  Wrist was splinted without difficulty. Reduction of fracture  Date/Time: 02/12/2019 6:57 AM Performed by: Veryl Speak, MD Authorized  by: Veryl Speak, MD  Consent: Verbal consent obtained. Written consent obtained. Risks and benefits: risks, benefits and alternatives were discussed Consent given by: patient Patient understanding: patient states understanding of the procedure being performed Patient consent: the patient's understanding of the procedure matches consent given Procedure consent: procedure consent matches procedure scheduled Relevant documents: relevant documents present and verified Test results: test results available and properly labeled Site marked: the operative site was marked Imaging studies: imaging studies available Required items: required blood products, implants, devices, and special equipment available Patient identity confirmed: verbally with patient, arm band and hospital-assigned identification number Time out: Immediately prior to procedure a "time out" was called to verify the correct patient, procedure, equipment, support staff and site/side marked as required. Preparation: Patient was prepped and draped in the usual sterile fashion. Local anesthesia used: no  Anesthesia: Local anesthesia used: no  Sedation: Patient sedated: yes Sedation type: moderate (conscious) sedation Sedatives: propofol Sedation start date/time: 02/12/2019 6:43 AM Sedation end date/time: 02/12/2019 6:58 AM Vitals: Vital signs were monitored during sedation.  Patient tolerance: patient tolerated the procedure well with no immediate complications Comments: The wrist was easily reduced and splinted in a sugar tong splint.    (including critical care time)  Medications Ordered in ED Medications - No data to display  ED Course  I have reviewed  the triage vital signs and the nursing notes.  Pertinent labs & imaging results that were available during my care of the patient were reviewed by me and considered in my medical decision making (see chart for details).    MDM Rules/Calculators/A&P  Patient with history of hepatitis C, hepatocellular carcinoma with recurrence, anemia.  He presents today for evaluation of a fall.  Patient has gross deformity of the left wrist and x-rays show displaced, angulated fractures of the distal ulna and radius.  Conscious sedation was performed and these fractures were reduced in consultation with Dr. Lenon Curt.  According to the patient's significant other, he has been somewhat what she describes as "lethargic" for the past several days.  Laboratory studies were obtained including CBC, comp, and ammonia level.  Patient does have an anemia with hemoglobin of 7.5, but rectal examination revealing brown, heme-negative stool.  In speaking with the patient and his significant other, it appears as though he is not taking his iron and I have advised him to resume this.  Patient will undergo CT scan of the head as he is on Xarelto and sustained a fall.  Care will be signed out to the oncoming provider at shift change.  Dr. Laverta Baltimore will obtain the results of the CT scan and determine the final disposition.  Patient's abdomen is benign and there are no other physical findings that would explain his anemia.  I have spoken with his wife, [name] and described everything that has transpired.  If his head CT is negative, patient will go home and continue his medications as before.  He is to follow-up his fractures with Dr. Lenon Curt.  CRITICAL CARE Performed by: Veryl Speak Total critical care time: 45 minutes Critical care time was exclusive of separately billable procedures and treating other patients. Critical care was necessary to treat or prevent imminent or life-threatening deterioration. Critical care was time spent  personally by me on the following activities: development of treatment plan with patient and/or surrogate as well as nursing, discussions with consultants, evaluation of patient's response to treatment, examination of patient, obtaining history from patient or surrogate, ordering and performing treatments and interventions,  ordering and review of laboratory studies, ordering and review of radiographic studies, pulse oximetry and re-evaluation of patient's condition.   Final Clinical Impression(s) / ED Diagnoses Final diagnoses:  None    Rx / DC Orders ED Discharge Orders    None       Veryl Speak, MD 02/12/19 0730

## 2019-02-12 NOTE — ED Notes (Signed)
Called [facility] to transport pt spoke to [employer]

## 2019-02-12 NOTE — ED Provider Notes (Signed)
Blood pressure (!) 182/83, pulse 86, temperature 98.1 F (36.7 C), temperature source Oral, resp. rate 20, height 6\' 1"  (1.854 m), weight 74.8 kg, SpO2 96 %.  Assuming care from Dr. Stark Jock.  In short, [facility] is a 69 y.o. male with a chief complaint of Fall .  Refer to the original H&P for additional details.  The current plan of care is to f/u on CT head and likely discharge. Patient s/p closed reduction of wrist with sedation. Wife update on plan.   08:15 AM  CT head with no acute findings.  Called to discuss the ED work-up with his wife [name].  She will call hospice today and make them aware of his ED visit.  She will also talk to them about repeat blood work in the coming week to track his hemoglobin.  He will restart his home iron supplements.  Discussed follow-up plan with orthopedic surgery with the wrist fracture.  [facility] to transport.    Margette Fast, MD 02/12/19 [phone]

## 2019-02-12 NOTE — Sedation Documentation (Signed)
Split placed.

## 2019-02-12 NOTE — Discharge Instructions (Addendum)
You were seen in the emergency department today after fall.  Your wrist is fractured and you need to follow with the hand surgeon listed.  Please call tomorrow and schedule an appointment in the next couple of days.  Keep the splint clean and dry.  The CT scan of your head did not show fracture or bleeding.  Your blood counts are low and your primary doctor will need to continue to follow this.  You may require a blood transfusion in the near future if your blood counts do not increase with iron.  Please start taking your iron supplement again.  Please call your hospice team to schedule repeat blood work in the next week.  Return to the emergency department with any new or suddenly worsening symptoms.

## 2019-02-12 NOTE — ED Triage Notes (Signed)
Pt brought in s/p fall, he states he lost his balance and fell. Denies hitting his head. Per [facility] obvious deformity to L wrist. RN unable to assess due to splint. Pt denies injury other than wrist. Per [facility] pt is a hospice patient and hospice has been notified and advised transport.

## 2019-02-12 NOTE — Sedation Documentation (Signed)
Care handoff to [place], [place].

## 2019-02-12 NOTE — ED Notes (Addendum)
Pt returned from CT, Long MD made aware of elevated BP

## 2019-02-13 DIAGNOSIS — C220 Liver cell carcinoma: Secondary | ICD-10-CM | POA: Diagnosis not present

## 2019-02-13 DIAGNOSIS — R180 Malignant ascites: Secondary | ICD-10-CM | POA: Diagnosis not present

## 2019-02-13 DIAGNOSIS — R634 Abnormal weight loss: Secondary | ICD-10-CM | POA: Diagnosis not present

## 2019-02-13 DIAGNOSIS — R1084 Generalized abdominal pain: Secondary | ICD-10-CM | POA: Diagnosis not present

## 2019-02-13 DIAGNOSIS — R630 Anorexia: Secondary | ICD-10-CM | POA: Diagnosis not present

## 2019-02-13 DIAGNOSIS — R1011 Right upper quadrant pain: Secondary | ICD-10-CM | POA: Diagnosis not present

## 2019-02-14 DIAGNOSIS — C220 Liver cell carcinoma: Secondary | ICD-10-CM | POA: Diagnosis not present

## 2019-02-14 DIAGNOSIS — R634 Abnormal weight loss: Secondary | ICD-10-CM | POA: Diagnosis not present

## 2019-02-14 DIAGNOSIS — R1084 Generalized abdominal pain: Secondary | ICD-10-CM | POA: Diagnosis not present

## 2019-02-14 DIAGNOSIS — R180 Malignant ascites: Secondary | ICD-10-CM | POA: Diagnosis not present

## 2019-02-14 DIAGNOSIS — R1011 Right upper quadrant pain: Secondary | ICD-10-CM | POA: Diagnosis not present

## 2019-02-14 DIAGNOSIS — R630 Anorexia: Secondary | ICD-10-CM | POA: Diagnosis not present

## 2019-02-20 DIAGNOSIS — E039 Hypothyroidism, unspecified: Secondary | ICD-10-CM | POA: Diagnosis not present

## 2019-02-20 DIAGNOSIS — K7460 Unspecified cirrhosis of liver: Secondary | ICD-10-CM | POA: Diagnosis not present

## 2019-02-20 DIAGNOSIS — I10 Essential (primary) hypertension: Secondary | ICD-10-CM | POA: Diagnosis not present

## 2019-02-20 DIAGNOSIS — F329 Major depressive disorder, single episode, unspecified: Secondary | ICD-10-CM | POA: Diagnosis not present

## 2019-02-20 DIAGNOSIS — R112 Nausea with vomiting, unspecified: Secondary | ICD-10-CM | POA: Diagnosis not present

## 2019-02-20 DIAGNOSIS — D630 Anemia in neoplastic disease: Secondary | ICD-10-CM | POA: Diagnosis not present

## 2019-02-20 DIAGNOSIS — C220 Liver cell carcinoma: Secondary | ICD-10-CM | POA: Diagnosis not present

## 2019-02-20 DIAGNOSIS — R180 Malignant ascites: Secondary | ICD-10-CM | POA: Diagnosis not present

## 2019-02-20 DIAGNOSIS — R1084 Generalized abdominal pain: Secondary | ICD-10-CM | POA: Diagnosis not present

## 2019-02-20 DIAGNOSIS — R1011 Right upper quadrant pain: Secondary | ICD-10-CM | POA: Diagnosis not present

## 2019-02-20 DIAGNOSIS — B1920 Unspecified viral hepatitis C without hepatic coma: Secondary | ICD-10-CM | POA: Diagnosis not present

## 2019-02-20 DIAGNOSIS — R630 Anorexia: Secondary | ICD-10-CM | POA: Diagnosis not present

## 2019-02-20 DIAGNOSIS — R634 Abnormal weight loss: Secondary | ICD-10-CM | POA: Diagnosis not present

## 2019-02-21 DIAGNOSIS — R1011 Right upper quadrant pain: Secondary | ICD-10-CM | POA: Diagnosis not present

## 2019-02-21 DIAGNOSIS — R630 Anorexia: Secondary | ICD-10-CM | POA: Diagnosis not present

## 2019-02-21 DIAGNOSIS — C220 Liver cell carcinoma: Secondary | ICD-10-CM | POA: Diagnosis not present

## 2019-02-21 DIAGNOSIS — R180 Malignant ascites: Secondary | ICD-10-CM | POA: Diagnosis not present

## 2019-02-21 DIAGNOSIS — R1084 Generalized abdominal pain: Secondary | ICD-10-CM | POA: Diagnosis not present

## 2019-02-21 DIAGNOSIS — R634 Abnormal weight loss: Secondary | ICD-10-CM | POA: Diagnosis not present

## 2019-02-24 DIAGNOSIS — R180 Malignant ascites: Secondary | ICD-10-CM | POA: Diagnosis not present

## 2019-02-24 DIAGNOSIS — R1084 Generalized abdominal pain: Secondary | ICD-10-CM | POA: Diagnosis not present

## 2019-02-24 DIAGNOSIS — R1011 Right upper quadrant pain: Secondary | ICD-10-CM | POA: Diagnosis not present

## 2019-02-24 DIAGNOSIS — R630 Anorexia: Secondary | ICD-10-CM | POA: Diagnosis not present

## 2019-02-24 DIAGNOSIS — R634 Abnormal weight loss: Secondary | ICD-10-CM | POA: Diagnosis not present

## 2019-02-24 DIAGNOSIS — C220 Liver cell carcinoma: Secondary | ICD-10-CM | POA: Diagnosis not present

## 2019-02-27 DIAGNOSIS — R1011 Right upper quadrant pain: Secondary | ICD-10-CM | POA: Diagnosis not present

## 2019-02-27 DIAGNOSIS — R180 Malignant ascites: Secondary | ICD-10-CM | POA: Diagnosis not present

## 2019-02-27 DIAGNOSIS — R634 Abnormal weight loss: Secondary | ICD-10-CM | POA: Diagnosis not present

## 2019-02-27 DIAGNOSIS — R630 Anorexia: Secondary | ICD-10-CM | POA: Diagnosis not present

## 2019-02-27 DIAGNOSIS — R1084 Generalized abdominal pain: Secondary | ICD-10-CM | POA: Diagnosis not present

## 2019-02-27 DIAGNOSIS — C220 Liver cell carcinoma: Secondary | ICD-10-CM | POA: Diagnosis not present

## 2019-03-02 DIAGNOSIS — R634 Abnormal weight loss: Secondary | ICD-10-CM | POA: Diagnosis not present

## 2019-03-02 DIAGNOSIS — R630 Anorexia: Secondary | ICD-10-CM | POA: Diagnosis not present

## 2019-03-02 DIAGNOSIS — R1084 Generalized abdominal pain: Secondary | ICD-10-CM | POA: Diagnosis not present

## 2019-03-02 DIAGNOSIS — C220 Liver cell carcinoma: Secondary | ICD-10-CM | POA: Diagnosis not present

## 2019-03-02 DIAGNOSIS — R1011 Right upper quadrant pain: Secondary | ICD-10-CM | POA: Diagnosis not present

## 2019-03-02 DIAGNOSIS — R180 Malignant ascites: Secondary | ICD-10-CM | POA: Diagnosis not present

## 2019-03-04 DIAGNOSIS — Z23 Encounter for immunization: Secondary | ICD-10-CM | POA: Diagnosis not present

## 2019-03-07 DIAGNOSIS — R1084 Generalized abdominal pain: Secondary | ICD-10-CM | POA: Diagnosis not present

## 2019-03-07 DIAGNOSIS — R630 Anorexia: Secondary | ICD-10-CM | POA: Diagnosis not present

## 2019-03-07 DIAGNOSIS — R634 Abnormal weight loss: Secondary | ICD-10-CM | POA: Diagnosis not present

## 2019-03-07 DIAGNOSIS — R1011 Right upper quadrant pain: Secondary | ICD-10-CM | POA: Diagnosis not present

## 2019-03-07 DIAGNOSIS — R180 Malignant ascites: Secondary | ICD-10-CM | POA: Diagnosis not present

## 2019-03-07 DIAGNOSIS — C220 Liver cell carcinoma: Secondary | ICD-10-CM | POA: Diagnosis not present

## 2019-03-10 DIAGNOSIS — R180 Malignant ascites: Secondary | ICD-10-CM | POA: Diagnosis not present

## 2019-03-10 DIAGNOSIS — R630 Anorexia: Secondary | ICD-10-CM | POA: Diagnosis not present

## 2019-03-10 DIAGNOSIS — C220 Liver cell carcinoma: Secondary | ICD-10-CM | POA: Diagnosis not present

## 2019-03-10 DIAGNOSIS — R1084 Generalized abdominal pain: Secondary | ICD-10-CM | POA: Diagnosis not present

## 2019-03-10 DIAGNOSIS — R634 Abnormal weight loss: Secondary | ICD-10-CM | POA: Diagnosis not present

## 2019-03-10 DIAGNOSIS — R1011 Right upper quadrant pain: Secondary | ICD-10-CM | POA: Diagnosis not present

## 2019-03-13 DIAGNOSIS — S52532A Colles' fracture of left radius, initial encounter for closed fracture: Secondary | ICD-10-CM | POA: Diagnosis not present

## 2019-03-20 DIAGNOSIS — R180 Malignant ascites: Secondary | ICD-10-CM | POA: Diagnosis not present

## 2019-03-20 DIAGNOSIS — F329 Major depressive disorder, single episode, unspecified: Secondary | ICD-10-CM | POA: Diagnosis not present

## 2019-03-20 DIAGNOSIS — R634 Abnormal weight loss: Secondary | ICD-10-CM | POA: Diagnosis not present

## 2019-03-20 DIAGNOSIS — B1920 Unspecified viral hepatitis C without hepatic coma: Secondary | ICD-10-CM | POA: Diagnosis not present

## 2019-03-20 DIAGNOSIS — R1084 Generalized abdominal pain: Secondary | ICD-10-CM | POA: Diagnosis not present

## 2019-03-20 DIAGNOSIS — I10 Essential (primary) hypertension: Secondary | ICD-10-CM | POA: Diagnosis not present

## 2019-03-20 DIAGNOSIS — C220 Liver cell carcinoma: Secondary | ICD-10-CM | POA: Diagnosis not present

## 2019-03-20 DIAGNOSIS — S52202D Unspecified fracture of shaft of left ulna, subsequent encounter for closed fracture with routine healing: Secondary | ICD-10-CM | POA: Diagnosis not present

## 2019-03-20 DIAGNOSIS — K7460 Unspecified cirrhosis of liver: Secondary | ICD-10-CM | POA: Diagnosis not present

## 2019-03-20 DIAGNOSIS — R112 Nausea with vomiting, unspecified: Secondary | ICD-10-CM | POA: Diagnosis not present

## 2019-03-20 DIAGNOSIS — E039 Hypothyroidism, unspecified: Secondary | ICD-10-CM | POA: Diagnosis not present

## 2019-03-20 DIAGNOSIS — S52302D Unspecified fracture of shaft of left radius, subsequent encounter for closed fracture with routine healing: Secondary | ICD-10-CM | POA: Diagnosis not present

## 2019-03-20 DIAGNOSIS — R1011 Right upper quadrant pain: Secondary | ICD-10-CM | POA: Diagnosis not present

## 2019-03-20 DIAGNOSIS — R630 Anorexia: Secondary | ICD-10-CM | POA: Diagnosis not present

## 2019-03-20 DIAGNOSIS — D630 Anemia in neoplastic disease: Secondary | ICD-10-CM | POA: Diagnosis not present

## 2019-03-27 DIAGNOSIS — R1084 Generalized abdominal pain: Secondary | ICD-10-CM | POA: Diagnosis not present

## 2019-03-27 DIAGNOSIS — R634 Abnormal weight loss: Secondary | ICD-10-CM | POA: Diagnosis not present

## 2019-03-27 DIAGNOSIS — R630 Anorexia: Secondary | ICD-10-CM | POA: Diagnosis not present

## 2019-03-27 DIAGNOSIS — R180 Malignant ascites: Secondary | ICD-10-CM | POA: Diagnosis not present

## 2019-03-27 DIAGNOSIS — K7460 Unspecified cirrhosis of liver: Secondary | ICD-10-CM | POA: Diagnosis not present

## 2019-03-27 DIAGNOSIS — C220 Liver cell carcinoma: Secondary | ICD-10-CM | POA: Diagnosis not present

## 2019-03-28 DIAGNOSIS — R1084 Generalized abdominal pain: Secondary | ICD-10-CM | POA: Diagnosis not present

## 2019-03-28 DIAGNOSIS — R634 Abnormal weight loss: Secondary | ICD-10-CM | POA: Diagnosis not present

## 2019-03-28 DIAGNOSIS — C220 Liver cell carcinoma: Secondary | ICD-10-CM | POA: Diagnosis not present

## 2019-03-28 DIAGNOSIS — K7460 Unspecified cirrhosis of liver: Secondary | ICD-10-CM | POA: Diagnosis not present

## 2019-03-28 DIAGNOSIS — R630 Anorexia: Secondary | ICD-10-CM | POA: Diagnosis not present

## 2019-03-28 DIAGNOSIS — R180 Malignant ascites: Secondary | ICD-10-CM | POA: Diagnosis not present

## 2019-04-02 DIAGNOSIS — Z23 Encounter for immunization: Secondary | ICD-10-CM | POA: Diagnosis not present

## 2019-04-11 DIAGNOSIS — K7460 Unspecified cirrhosis of liver: Secondary | ICD-10-CM | POA: Diagnosis not present

## 2019-04-11 DIAGNOSIS — C220 Liver cell carcinoma: Secondary | ICD-10-CM | POA: Diagnosis not present

## 2019-04-11 DIAGNOSIS — R634 Abnormal weight loss: Secondary | ICD-10-CM | POA: Diagnosis not present

## 2019-04-11 DIAGNOSIS — R180 Malignant ascites: Secondary | ICD-10-CM | POA: Diagnosis not present

## 2019-04-11 DIAGNOSIS — R1084 Generalized abdominal pain: Secondary | ICD-10-CM | POA: Diagnosis not present

## 2019-04-11 DIAGNOSIS — R630 Anorexia: Secondary | ICD-10-CM | POA: Diagnosis not present

## 2019-04-12 DIAGNOSIS — S52532D Colles' fracture of left radius, subsequent encounter for closed fracture with routine healing: Secondary | ICD-10-CM | POA: Diagnosis not present

## 2019-04-14 DIAGNOSIS — R1084 Generalized abdominal pain: Secondary | ICD-10-CM | POA: Diagnosis not present

## 2019-04-14 DIAGNOSIS — C220 Liver cell carcinoma: Secondary | ICD-10-CM | POA: Diagnosis not present

## 2019-04-14 DIAGNOSIS — R630 Anorexia: Secondary | ICD-10-CM | POA: Diagnosis not present

## 2019-04-14 DIAGNOSIS — K7460 Unspecified cirrhosis of liver: Secondary | ICD-10-CM | POA: Diagnosis not present

## 2019-04-14 DIAGNOSIS — R180 Malignant ascites: Secondary | ICD-10-CM | POA: Diagnosis not present

## 2019-04-14 DIAGNOSIS — R634 Abnormal weight loss: Secondary | ICD-10-CM | POA: Diagnosis not present

## 2019-04-18 DIAGNOSIS — R1084 Generalized abdominal pain: Secondary | ICD-10-CM | POA: Diagnosis not present

## 2019-04-18 DIAGNOSIS — K7460 Unspecified cirrhosis of liver: Secondary | ICD-10-CM | POA: Diagnosis not present

## 2019-04-18 DIAGNOSIS — R630 Anorexia: Secondary | ICD-10-CM | POA: Diagnosis not present

## 2019-04-18 DIAGNOSIS — R180 Malignant ascites: Secondary | ICD-10-CM | POA: Diagnosis not present

## 2019-04-18 DIAGNOSIS — R634 Abnormal weight loss: Secondary | ICD-10-CM | POA: Diagnosis not present

## 2019-04-18 DIAGNOSIS — C220 Liver cell carcinoma: Secondary | ICD-10-CM | POA: Diagnosis not present

## 2019-04-20 DIAGNOSIS — R180 Malignant ascites: Secondary | ICD-10-CM | POA: Diagnosis not present

## 2019-04-20 DIAGNOSIS — I10 Essential (primary) hypertension: Secondary | ICD-10-CM | POA: Diagnosis not present

## 2019-04-20 DIAGNOSIS — B1920 Unspecified viral hepatitis C without hepatic coma: Secondary | ICD-10-CM | POA: Diagnosis not present

## 2019-04-20 DIAGNOSIS — S52302D Unspecified fracture of shaft of left radius, subsequent encounter for closed fracture with routine healing: Secondary | ICD-10-CM | POA: Diagnosis not present

## 2019-04-20 DIAGNOSIS — E039 Hypothyroidism, unspecified: Secondary | ICD-10-CM | POA: Diagnosis not present

## 2019-04-20 DIAGNOSIS — R1011 Right upper quadrant pain: Secondary | ICD-10-CM | POA: Diagnosis not present

## 2019-04-20 DIAGNOSIS — C220 Liver cell carcinoma: Secondary | ICD-10-CM | POA: Diagnosis not present

## 2019-04-20 DIAGNOSIS — R1084 Generalized abdominal pain: Secondary | ICD-10-CM | POA: Diagnosis not present

## 2019-04-20 DIAGNOSIS — S52202D Unspecified fracture of shaft of left ulna, subsequent encounter for closed fracture with routine healing: Secondary | ICD-10-CM | POA: Diagnosis not present

## 2019-04-20 DIAGNOSIS — D630 Anemia in neoplastic disease: Secondary | ICD-10-CM | POA: Diagnosis not present

## 2019-04-20 DIAGNOSIS — K7460 Unspecified cirrhosis of liver: Secondary | ICD-10-CM | POA: Diagnosis not present

## 2019-04-20 DIAGNOSIS — R630 Anorexia: Secondary | ICD-10-CM | POA: Diagnosis not present

## 2019-04-20 DIAGNOSIS — F329 Major depressive disorder, single episode, unspecified: Secondary | ICD-10-CM | POA: Diagnosis not present

## 2019-04-20 DIAGNOSIS — R112 Nausea with vomiting, unspecified: Secondary | ICD-10-CM | POA: Diagnosis not present

## 2019-04-20 DIAGNOSIS — R634 Abnormal weight loss: Secondary | ICD-10-CM | POA: Diagnosis not present

## 2019-04-25 DIAGNOSIS — R634 Abnormal weight loss: Secondary | ICD-10-CM | POA: Diagnosis not present

## 2019-04-25 DIAGNOSIS — R180 Malignant ascites: Secondary | ICD-10-CM | POA: Diagnosis not present

## 2019-04-25 DIAGNOSIS — R1084 Generalized abdominal pain: Secondary | ICD-10-CM | POA: Diagnosis not present

## 2019-04-25 DIAGNOSIS — R630 Anorexia: Secondary | ICD-10-CM | POA: Diagnosis not present

## 2019-04-25 DIAGNOSIS — K7460 Unspecified cirrhosis of liver: Secondary | ICD-10-CM | POA: Diagnosis not present

## 2019-04-25 DIAGNOSIS — C220 Liver cell carcinoma: Secondary | ICD-10-CM | POA: Diagnosis not present

## 2019-05-02 DIAGNOSIS — R1084 Generalized abdominal pain: Secondary | ICD-10-CM | POA: Diagnosis not present

## 2019-05-02 DIAGNOSIS — R180 Malignant ascites: Secondary | ICD-10-CM | POA: Diagnosis not present

## 2019-05-02 DIAGNOSIS — R630 Anorexia: Secondary | ICD-10-CM | POA: Diagnosis not present

## 2019-05-02 DIAGNOSIS — K7460 Unspecified cirrhosis of liver: Secondary | ICD-10-CM | POA: Diagnosis not present

## 2019-05-02 DIAGNOSIS — R634 Abnormal weight loss: Secondary | ICD-10-CM | POA: Diagnosis not present

## 2019-05-02 DIAGNOSIS — C220 Liver cell carcinoma: Secondary | ICD-10-CM | POA: Diagnosis not present

## 2019-05-10 DIAGNOSIS — S52532A Colles' fracture of left radius, initial encounter for closed fracture: Secondary | ICD-10-CM | POA: Diagnosis not present

## 2019-05-11 DIAGNOSIS — R634 Abnormal weight loss: Secondary | ICD-10-CM | POA: Diagnosis not present

## 2019-05-11 DIAGNOSIS — K7460 Unspecified cirrhosis of liver: Secondary | ICD-10-CM | POA: Diagnosis not present

## 2019-05-11 DIAGNOSIS — R1084 Generalized abdominal pain: Secondary | ICD-10-CM | POA: Diagnosis not present

## 2019-05-11 DIAGNOSIS — R630 Anorexia: Secondary | ICD-10-CM | POA: Diagnosis not present

## 2019-05-11 DIAGNOSIS — C220 Liver cell carcinoma: Secondary | ICD-10-CM | POA: Diagnosis not present

## 2019-05-11 DIAGNOSIS — R180 Malignant ascites: Secondary | ICD-10-CM | POA: Diagnosis not present

## 2019-05-16 DIAGNOSIS — K7460 Unspecified cirrhosis of liver: Secondary | ICD-10-CM | POA: Diagnosis not present

## 2019-05-16 DIAGNOSIS — R630 Anorexia: Secondary | ICD-10-CM | POA: Diagnosis not present

## 2019-05-16 DIAGNOSIS — R1084 Generalized abdominal pain: Secondary | ICD-10-CM | POA: Diagnosis not present

## 2019-05-16 DIAGNOSIS — R180 Malignant ascites: Secondary | ICD-10-CM | POA: Diagnosis not present

## 2019-05-16 DIAGNOSIS — R634 Abnormal weight loss: Secondary | ICD-10-CM | POA: Diagnosis not present

## 2019-05-16 DIAGNOSIS — C220 Liver cell carcinoma: Secondary | ICD-10-CM | POA: Diagnosis not present

## 2019-05-20 DIAGNOSIS — R112 Nausea with vomiting, unspecified: Secondary | ICD-10-CM | POA: Diagnosis not present

## 2019-05-20 DIAGNOSIS — S52302D Unspecified fracture of shaft of left radius, subsequent encounter for closed fracture with routine healing: Secondary | ICD-10-CM | POA: Diagnosis not present

## 2019-05-20 DIAGNOSIS — S52202D Unspecified fracture of shaft of left ulna, subsequent encounter for closed fracture with routine healing: Secondary | ICD-10-CM | POA: Diagnosis not present

## 2019-05-20 DIAGNOSIS — R1011 Right upper quadrant pain: Secondary | ICD-10-CM | POA: Diagnosis not present

## 2019-05-20 DIAGNOSIS — R630 Anorexia: Secondary | ICD-10-CM | POA: Diagnosis not present

## 2019-05-20 DIAGNOSIS — I10 Essential (primary) hypertension: Secondary | ICD-10-CM | POA: Diagnosis not present

## 2019-05-20 DIAGNOSIS — E039 Hypothyroidism, unspecified: Secondary | ICD-10-CM | POA: Diagnosis not present

## 2019-05-20 DIAGNOSIS — R1084 Generalized abdominal pain: Secondary | ICD-10-CM | POA: Diagnosis not present

## 2019-05-20 DIAGNOSIS — D630 Anemia in neoplastic disease: Secondary | ICD-10-CM | POA: Diagnosis not present

## 2019-05-20 DIAGNOSIS — R180 Malignant ascites: Secondary | ICD-10-CM | POA: Diagnosis not present

## 2019-05-20 DIAGNOSIS — F329 Major depressive disorder, single episode, unspecified: Secondary | ICD-10-CM | POA: Diagnosis not present

## 2019-05-20 DIAGNOSIS — R634 Abnormal weight loss: Secondary | ICD-10-CM | POA: Diagnosis not present

## 2019-05-20 DIAGNOSIS — K7460 Unspecified cirrhosis of liver: Secondary | ICD-10-CM | POA: Diagnosis not present

## 2019-05-20 DIAGNOSIS — B1920 Unspecified viral hepatitis C without hepatic coma: Secondary | ICD-10-CM | POA: Diagnosis not present

## 2019-05-20 DIAGNOSIS — C220 Liver cell carcinoma: Secondary | ICD-10-CM | POA: Diagnosis not present

## 2019-05-23 DIAGNOSIS — R180 Malignant ascites: Secondary | ICD-10-CM | POA: Diagnosis not present

## 2019-05-23 DIAGNOSIS — R634 Abnormal weight loss: Secondary | ICD-10-CM | POA: Diagnosis not present

## 2019-05-23 DIAGNOSIS — K7460 Unspecified cirrhosis of liver: Secondary | ICD-10-CM | POA: Diagnosis not present

## 2019-05-23 DIAGNOSIS — R1084 Generalized abdominal pain: Secondary | ICD-10-CM | POA: Diagnosis not present

## 2019-05-23 DIAGNOSIS — C220 Liver cell carcinoma: Secondary | ICD-10-CM | POA: Diagnosis not present

## 2019-05-23 DIAGNOSIS — R630 Anorexia: Secondary | ICD-10-CM | POA: Diagnosis not present

## 2019-05-30 DIAGNOSIS — R634 Abnormal weight loss: Secondary | ICD-10-CM | POA: Diagnosis not present

## 2019-05-30 DIAGNOSIS — K7460 Unspecified cirrhosis of liver: Secondary | ICD-10-CM | POA: Diagnosis not present

## 2019-05-30 DIAGNOSIS — R630 Anorexia: Secondary | ICD-10-CM | POA: Diagnosis not present

## 2019-05-30 DIAGNOSIS — C220 Liver cell carcinoma: Secondary | ICD-10-CM | POA: Diagnosis not present

## 2019-05-30 DIAGNOSIS — R180 Malignant ascites: Secondary | ICD-10-CM | POA: Diagnosis not present

## 2019-05-30 DIAGNOSIS — R1084 Generalized abdominal pain: Secondary | ICD-10-CM | POA: Diagnosis not present

## 2019-06-07 DIAGNOSIS — R1084 Generalized abdominal pain: Secondary | ICD-10-CM | POA: Diagnosis not present

## 2019-06-07 DIAGNOSIS — C220 Liver cell carcinoma: Secondary | ICD-10-CM | POA: Diagnosis not present

## 2019-06-07 DIAGNOSIS — R180 Malignant ascites: Secondary | ICD-10-CM | POA: Diagnosis not present

## 2019-06-07 DIAGNOSIS — R630 Anorexia: Secondary | ICD-10-CM | POA: Diagnosis not present

## 2019-06-07 DIAGNOSIS — R634 Abnormal weight loss: Secondary | ICD-10-CM | POA: Diagnosis not present

## 2019-06-07 DIAGNOSIS — K7460 Unspecified cirrhosis of liver: Secondary | ICD-10-CM | POA: Diagnosis not present

## 2019-06-13 DIAGNOSIS — R634 Abnormal weight loss: Secondary | ICD-10-CM | POA: Diagnosis not present

## 2019-06-13 DIAGNOSIS — R630 Anorexia: Secondary | ICD-10-CM | POA: Diagnosis not present

## 2019-06-13 DIAGNOSIS — C220 Liver cell carcinoma: Secondary | ICD-10-CM | POA: Diagnosis not present

## 2019-06-13 DIAGNOSIS — R180 Malignant ascites: Secondary | ICD-10-CM | POA: Diagnosis not present

## 2019-06-13 DIAGNOSIS — K7460 Unspecified cirrhosis of liver: Secondary | ICD-10-CM | POA: Diagnosis not present

## 2019-06-13 DIAGNOSIS — R1084 Generalized abdominal pain: Secondary | ICD-10-CM | POA: Diagnosis not present

## 2019-07-20 DIAGNOSIS — R634 Abnormal weight loss: Secondary | ICD-10-CM | POA: Diagnosis not present

## 2019-07-20 DIAGNOSIS — C220 Liver cell carcinoma: Secondary | ICD-10-CM | POA: Diagnosis not present

## 2019-07-20 DIAGNOSIS — I10 Essential (primary) hypertension: Secondary | ICD-10-CM | POA: Diagnosis not present

## 2019-07-20 DIAGNOSIS — B1920 Unspecified viral hepatitis C without hepatic coma: Secondary | ICD-10-CM | POA: Diagnosis not present

## 2019-07-20 DIAGNOSIS — S52302D Unspecified fracture of shaft of left radius, subsequent encounter for closed fracture with routine healing: Secondary | ICD-10-CM | POA: Diagnosis not present

## 2019-07-20 DIAGNOSIS — R1011 Right upper quadrant pain: Secondary | ICD-10-CM | POA: Diagnosis not present

## 2019-07-20 DIAGNOSIS — K7460 Unspecified cirrhosis of liver: Secondary | ICD-10-CM | POA: Diagnosis not present

## 2019-07-20 DIAGNOSIS — R112 Nausea with vomiting, unspecified: Secondary | ICD-10-CM | POA: Diagnosis not present

## 2019-07-20 DIAGNOSIS — R1084 Generalized abdominal pain: Secondary | ICD-10-CM | POA: Diagnosis not present

## 2019-07-20 DIAGNOSIS — R180 Malignant ascites: Secondary | ICD-10-CM | POA: Diagnosis not present

## 2019-07-20 DIAGNOSIS — D630 Anemia in neoplastic disease: Secondary | ICD-10-CM | POA: Diagnosis not present

## 2019-07-20 DIAGNOSIS — R630 Anorexia: Secondary | ICD-10-CM | POA: Diagnosis not present

## 2019-07-20 DIAGNOSIS — S52202D Unspecified fracture of shaft of left ulna, subsequent encounter for closed fracture with routine healing: Secondary | ICD-10-CM | POA: Diagnosis not present

## 2019-07-20 DIAGNOSIS — E039 Hypothyroidism, unspecified: Secondary | ICD-10-CM | POA: Diagnosis not present

## 2019-07-20 DIAGNOSIS — F329 Major depressive disorder, single episode, unspecified: Secondary | ICD-10-CM | POA: Diagnosis not present

## 2019-07-25 DIAGNOSIS — R630 Anorexia: Secondary | ICD-10-CM | POA: Diagnosis not present

## 2019-07-25 DIAGNOSIS — R634 Abnormal weight loss: Secondary | ICD-10-CM | POA: Diagnosis not present

## 2019-07-25 DIAGNOSIS — R1084 Generalized abdominal pain: Secondary | ICD-10-CM | POA: Diagnosis not present

## 2019-07-25 DIAGNOSIS — C220 Liver cell carcinoma: Secondary | ICD-10-CM | POA: Diagnosis not present

## 2019-07-25 DIAGNOSIS — K7460 Unspecified cirrhosis of liver: Secondary | ICD-10-CM | POA: Diagnosis not present

## 2019-07-25 DIAGNOSIS — R180 Malignant ascites: Secondary | ICD-10-CM | POA: Diagnosis not present

## 2019-08-01 DIAGNOSIS — R1084 Generalized abdominal pain: Secondary | ICD-10-CM | POA: Diagnosis not present

## 2019-08-01 DIAGNOSIS — R180 Malignant ascites: Secondary | ICD-10-CM | POA: Diagnosis not present

## 2019-08-01 DIAGNOSIS — K7460 Unspecified cirrhosis of liver: Secondary | ICD-10-CM | POA: Diagnosis not present

## 2019-08-01 DIAGNOSIS — R634 Abnormal weight loss: Secondary | ICD-10-CM | POA: Diagnosis not present

## 2019-08-01 DIAGNOSIS — R630 Anorexia: Secondary | ICD-10-CM | POA: Diagnosis not present

## 2019-08-01 DIAGNOSIS — C220 Liver cell carcinoma: Secondary | ICD-10-CM | POA: Diagnosis not present

## 2019-08-05 DIAGNOSIS — F419 Anxiety disorder, unspecified: Secondary | ICD-10-CM | POA: Diagnosis not present

## 2019-08-05 DIAGNOSIS — G894 Chronic pain syndrome: Secondary | ICD-10-CM | POA: Diagnosis not present

## 2019-08-05 DIAGNOSIS — K5903 Drug induced constipation: Secondary | ICD-10-CM | POA: Diagnosis not present

## 2019-08-08 DIAGNOSIS — C220 Liver cell carcinoma: Secondary | ICD-10-CM | POA: Diagnosis not present

## 2019-08-08 DIAGNOSIS — R634 Abnormal weight loss: Secondary | ICD-10-CM | POA: Diagnosis not present

## 2019-08-08 DIAGNOSIS — K7460 Unspecified cirrhosis of liver: Secondary | ICD-10-CM | POA: Diagnosis not present

## 2019-08-08 DIAGNOSIS — R630 Anorexia: Secondary | ICD-10-CM | POA: Diagnosis not present

## 2019-08-08 DIAGNOSIS — R1084 Generalized abdominal pain: Secondary | ICD-10-CM | POA: Diagnosis not present

## 2019-08-08 DIAGNOSIS — R180 Malignant ascites: Secondary | ICD-10-CM | POA: Diagnosis not present

## 2019-08-15 DIAGNOSIS — R630 Anorexia: Secondary | ICD-10-CM | POA: Diagnosis not present

## 2019-08-15 DIAGNOSIS — K7460 Unspecified cirrhosis of liver: Secondary | ICD-10-CM | POA: Diagnosis not present

## 2019-08-15 DIAGNOSIS — R180 Malignant ascites: Secondary | ICD-10-CM | POA: Diagnosis not present

## 2019-08-15 DIAGNOSIS — C220 Liver cell carcinoma: Secondary | ICD-10-CM | POA: Diagnosis not present

## 2019-08-15 DIAGNOSIS — R1084 Generalized abdominal pain: Secondary | ICD-10-CM | POA: Diagnosis not present

## 2019-08-15 DIAGNOSIS — R634 Abnormal weight loss: Secondary | ICD-10-CM | POA: Diagnosis not present

## 2019-08-17 DIAGNOSIS — K7460 Unspecified cirrhosis of liver: Secondary | ICD-10-CM | POA: Diagnosis not present

## 2019-08-17 DIAGNOSIS — R1084 Generalized abdominal pain: Secondary | ICD-10-CM | POA: Diagnosis not present

## 2019-08-17 DIAGNOSIS — R180 Malignant ascites: Secondary | ICD-10-CM | POA: Diagnosis not present

## 2019-08-17 DIAGNOSIS — C220 Liver cell carcinoma: Secondary | ICD-10-CM | POA: Diagnosis not present

## 2019-08-17 DIAGNOSIS — R630 Anorexia: Secondary | ICD-10-CM | POA: Diagnosis not present

## 2019-08-17 DIAGNOSIS — R634 Abnormal weight loss: Secondary | ICD-10-CM | POA: Diagnosis not present

## 2019-08-20 DIAGNOSIS — B1920 Unspecified viral hepatitis C without hepatic coma: Secondary | ICD-10-CM | POA: Diagnosis not present

## 2019-08-20 DIAGNOSIS — R1011 Right upper quadrant pain: Secondary | ICD-10-CM | POA: Diagnosis not present

## 2019-08-20 DIAGNOSIS — K7460 Unspecified cirrhosis of liver: Secondary | ICD-10-CM | POA: Diagnosis not present

## 2019-08-20 DIAGNOSIS — I10 Essential (primary) hypertension: Secondary | ICD-10-CM | POA: Diagnosis not present

## 2019-08-20 DIAGNOSIS — S52302D Unspecified fracture of shaft of left radius, subsequent encounter for closed fracture with routine healing: Secondary | ICD-10-CM | POA: Diagnosis not present

## 2019-08-20 DIAGNOSIS — R1084 Generalized abdominal pain: Secondary | ICD-10-CM | POA: Diagnosis not present

## 2019-08-20 DIAGNOSIS — R180 Malignant ascites: Secondary | ICD-10-CM | POA: Diagnosis not present

## 2019-08-20 DIAGNOSIS — F329 Major depressive disorder, single episode, unspecified: Secondary | ICD-10-CM | POA: Diagnosis not present

## 2019-08-20 DIAGNOSIS — E039 Hypothyroidism, unspecified: Secondary | ICD-10-CM | POA: Diagnosis not present

## 2019-08-20 DIAGNOSIS — R634 Abnormal weight loss: Secondary | ICD-10-CM | POA: Diagnosis not present

## 2019-08-20 DIAGNOSIS — R112 Nausea with vomiting, unspecified: Secondary | ICD-10-CM | POA: Diagnosis not present

## 2019-08-20 DIAGNOSIS — C220 Liver cell carcinoma: Secondary | ICD-10-CM | POA: Diagnosis not present

## 2019-08-20 DIAGNOSIS — D630 Anemia in neoplastic disease: Secondary | ICD-10-CM | POA: Diagnosis not present

## 2019-08-20 DIAGNOSIS — R630 Anorexia: Secondary | ICD-10-CM | POA: Diagnosis not present

## 2019-08-20 DIAGNOSIS — S52202D Unspecified fracture of shaft of left ulna, subsequent encounter for closed fracture with routine healing: Secondary | ICD-10-CM | POA: Diagnosis not present

## 2019-08-28 DIAGNOSIS — R630 Anorexia: Secondary | ICD-10-CM | POA: Diagnosis not present

## 2019-08-28 DIAGNOSIS — K7460 Unspecified cirrhosis of liver: Secondary | ICD-10-CM | POA: Diagnosis not present

## 2019-08-28 DIAGNOSIS — R180 Malignant ascites: Secondary | ICD-10-CM | POA: Diagnosis not present

## 2019-08-28 DIAGNOSIS — C220 Liver cell carcinoma: Secondary | ICD-10-CM | POA: Diagnosis not present

## 2019-08-28 DIAGNOSIS — R634 Abnormal weight loss: Secondary | ICD-10-CM | POA: Diagnosis not present

## 2019-08-28 DIAGNOSIS — R1084 Generalized abdominal pain: Secondary | ICD-10-CM | POA: Diagnosis not present

## 2019-08-29 DIAGNOSIS — R1084 Generalized abdominal pain: Secondary | ICD-10-CM | POA: Diagnosis not present

## 2019-08-29 DIAGNOSIS — C220 Liver cell carcinoma: Secondary | ICD-10-CM | POA: Diagnosis not present

## 2019-08-29 DIAGNOSIS — K7460 Unspecified cirrhosis of liver: Secondary | ICD-10-CM | POA: Diagnosis not present

## 2019-08-29 DIAGNOSIS — R630 Anorexia: Secondary | ICD-10-CM | POA: Diagnosis not present

## 2019-08-29 DIAGNOSIS — R180 Malignant ascites: Secondary | ICD-10-CM | POA: Diagnosis not present

## 2019-08-29 DIAGNOSIS — R634 Abnormal weight loss: Secondary | ICD-10-CM | POA: Diagnosis not present

## 2019-09-19 DIAGNOSIS — R180 Malignant ascites: Secondary | ICD-10-CM | POA: Diagnosis not present

## 2019-09-19 DIAGNOSIS — K7460 Unspecified cirrhosis of liver: Secondary | ICD-10-CM | POA: Diagnosis not present

## 2019-09-19 DIAGNOSIS — R634 Abnormal weight loss: Secondary | ICD-10-CM | POA: Diagnosis not present

## 2019-09-19 DIAGNOSIS — R1084 Generalized abdominal pain: Secondary | ICD-10-CM | POA: Diagnosis not present

## 2019-09-19 DIAGNOSIS — R630 Anorexia: Secondary | ICD-10-CM | POA: Diagnosis not present

## 2019-09-19 DIAGNOSIS — C220 Liver cell carcinoma: Secondary | ICD-10-CM | POA: Diagnosis not present

## 2019-09-20 DIAGNOSIS — S52202D Unspecified fracture of shaft of left ulna, subsequent encounter for closed fracture with routine healing: Secondary | ICD-10-CM | POA: Diagnosis not present

## 2019-09-20 DIAGNOSIS — E039 Hypothyroidism, unspecified: Secondary | ICD-10-CM | POA: Diagnosis not present

## 2019-09-20 DIAGNOSIS — R1011 Right upper quadrant pain: Secondary | ICD-10-CM | POA: Diagnosis not present

## 2019-09-20 DIAGNOSIS — R630 Anorexia: Secondary | ICD-10-CM | POA: Diagnosis not present

## 2019-09-20 DIAGNOSIS — B1920 Unspecified viral hepatitis C without hepatic coma: Secondary | ICD-10-CM | POA: Diagnosis not present

## 2019-09-20 DIAGNOSIS — D630 Anemia in neoplastic disease: Secondary | ICD-10-CM | POA: Diagnosis not present

## 2019-09-20 DIAGNOSIS — R1084 Generalized abdominal pain: Secondary | ICD-10-CM | POA: Diagnosis not present

## 2019-09-20 DIAGNOSIS — I10 Essential (primary) hypertension: Secondary | ICD-10-CM | POA: Diagnosis not present

## 2019-09-20 DIAGNOSIS — R180 Malignant ascites: Secondary | ICD-10-CM | POA: Diagnosis not present

## 2019-09-20 DIAGNOSIS — S52302D Unspecified fracture of shaft of left radius, subsequent encounter for closed fracture with routine healing: Secondary | ICD-10-CM | POA: Diagnosis not present

## 2019-09-20 DIAGNOSIS — R634 Abnormal weight loss: Secondary | ICD-10-CM | POA: Diagnosis not present

## 2019-09-20 DIAGNOSIS — F329 Major depressive disorder, single episode, unspecified: Secondary | ICD-10-CM | POA: Diagnosis not present

## 2019-09-20 DIAGNOSIS — R112 Nausea with vomiting, unspecified: Secondary | ICD-10-CM | POA: Diagnosis not present

## 2019-09-20 DIAGNOSIS — K7460 Unspecified cirrhosis of liver: Secondary | ICD-10-CM | POA: Diagnosis not present

## 2019-09-20 DIAGNOSIS — C220 Liver cell carcinoma: Secondary | ICD-10-CM | POA: Diagnosis not present

## 2019-09-26 DIAGNOSIS — R634 Abnormal weight loss: Secondary | ICD-10-CM | POA: Diagnosis not present

## 2019-09-26 DIAGNOSIS — K7460 Unspecified cirrhosis of liver: Secondary | ICD-10-CM | POA: Diagnosis not present

## 2019-09-26 DIAGNOSIS — R180 Malignant ascites: Secondary | ICD-10-CM | POA: Diagnosis not present

## 2019-09-26 DIAGNOSIS — C220 Liver cell carcinoma: Secondary | ICD-10-CM | POA: Diagnosis not present

## 2019-09-26 DIAGNOSIS — R1084 Generalized abdominal pain: Secondary | ICD-10-CM | POA: Diagnosis not present

## 2019-09-26 DIAGNOSIS — R630 Anorexia: Secondary | ICD-10-CM | POA: Diagnosis not present

## 2019-10-04 DIAGNOSIS — R630 Anorexia: Secondary | ICD-10-CM | POA: Diagnosis not present

## 2019-10-04 DIAGNOSIS — R634 Abnormal weight loss: Secondary | ICD-10-CM | POA: Diagnosis not present

## 2019-10-04 DIAGNOSIS — R180 Malignant ascites: Secondary | ICD-10-CM | POA: Diagnosis not present

## 2019-10-04 DIAGNOSIS — K7460 Unspecified cirrhosis of liver: Secondary | ICD-10-CM | POA: Diagnosis not present

## 2019-10-04 DIAGNOSIS — C220 Liver cell carcinoma: Secondary | ICD-10-CM | POA: Diagnosis not present

## 2019-10-04 DIAGNOSIS — R1084 Generalized abdominal pain: Secondary | ICD-10-CM | POA: Diagnosis not present

## 2019-10-10 DIAGNOSIS — K7460 Unspecified cirrhosis of liver: Secondary | ICD-10-CM | POA: Diagnosis not present

## 2019-10-10 DIAGNOSIS — C220 Liver cell carcinoma: Secondary | ICD-10-CM | POA: Diagnosis not present

## 2019-10-10 DIAGNOSIS — R1084 Generalized abdominal pain: Secondary | ICD-10-CM | POA: Diagnosis not present

## 2019-10-10 DIAGNOSIS — R634 Abnormal weight loss: Secondary | ICD-10-CM | POA: Diagnosis not present

## 2019-10-10 DIAGNOSIS — R630 Anorexia: Secondary | ICD-10-CM | POA: Diagnosis not present

## 2019-10-10 DIAGNOSIS — R180 Malignant ascites: Secondary | ICD-10-CM | POA: Diagnosis not present

## 2019-10-16 DIAGNOSIS — R1084 Generalized abdominal pain: Secondary | ICD-10-CM | POA: Diagnosis not present

## 2019-10-16 DIAGNOSIS — C220 Liver cell carcinoma: Secondary | ICD-10-CM | POA: Diagnosis not present

## 2019-10-16 DIAGNOSIS — R630 Anorexia: Secondary | ICD-10-CM | POA: Diagnosis not present

## 2019-10-16 DIAGNOSIS — K7460 Unspecified cirrhosis of liver: Secondary | ICD-10-CM | POA: Diagnosis not present

## 2019-10-16 DIAGNOSIS — R634 Abnormal weight loss: Secondary | ICD-10-CM | POA: Diagnosis not present

## 2019-10-16 DIAGNOSIS — R180 Malignant ascites: Secondary | ICD-10-CM | POA: Diagnosis not present

## 2019-10-18 DIAGNOSIS — R634 Abnormal weight loss: Secondary | ICD-10-CM | POA: Diagnosis not present

## 2019-10-18 DIAGNOSIS — R630 Anorexia: Secondary | ICD-10-CM | POA: Diagnosis not present

## 2019-10-18 DIAGNOSIS — R180 Malignant ascites: Secondary | ICD-10-CM | POA: Diagnosis not present

## 2019-10-18 DIAGNOSIS — R1084 Generalized abdominal pain: Secondary | ICD-10-CM | POA: Diagnosis not present

## 2019-10-18 DIAGNOSIS — K7460 Unspecified cirrhosis of liver: Secondary | ICD-10-CM | POA: Diagnosis not present

## 2019-10-18 DIAGNOSIS — C220 Liver cell carcinoma: Secondary | ICD-10-CM | POA: Diagnosis not present

## 2019-10-20 DIAGNOSIS — F329 Major depressive disorder, single episode, unspecified: Secondary | ICD-10-CM | POA: Diagnosis not present

## 2019-10-20 DIAGNOSIS — S52202D Unspecified fracture of shaft of left ulna, subsequent encounter for closed fracture with routine healing: Secondary | ICD-10-CM | POA: Diagnosis not present

## 2019-10-20 DIAGNOSIS — K7460 Unspecified cirrhosis of liver: Secondary | ICD-10-CM | POA: Diagnosis not present

## 2019-10-20 DIAGNOSIS — R634 Abnormal weight loss: Secondary | ICD-10-CM | POA: Diagnosis not present

## 2019-10-20 DIAGNOSIS — R1011 Right upper quadrant pain: Secondary | ICD-10-CM | POA: Diagnosis not present

## 2019-10-20 DIAGNOSIS — C220 Liver cell carcinoma: Secondary | ICD-10-CM | POA: Diagnosis not present

## 2019-10-20 DIAGNOSIS — R630 Anorexia: Secondary | ICD-10-CM | POA: Diagnosis not present

## 2019-10-20 DIAGNOSIS — E039 Hypothyroidism, unspecified: Secondary | ICD-10-CM | POA: Diagnosis not present

## 2019-10-20 DIAGNOSIS — R1084 Generalized abdominal pain: Secondary | ICD-10-CM | POA: Diagnosis not present

## 2019-10-20 DIAGNOSIS — R180 Malignant ascites: Secondary | ICD-10-CM | POA: Diagnosis not present

## 2019-10-20 DIAGNOSIS — I10 Essential (primary) hypertension: Secondary | ICD-10-CM | POA: Diagnosis not present

## 2019-10-20 DIAGNOSIS — D630 Anemia in neoplastic disease: Secondary | ICD-10-CM | POA: Diagnosis not present

## 2019-10-20 DIAGNOSIS — R112 Nausea with vomiting, unspecified: Secondary | ICD-10-CM | POA: Diagnosis not present

## 2019-10-20 DIAGNOSIS — B1920 Unspecified viral hepatitis C without hepatic coma: Secondary | ICD-10-CM | POA: Diagnosis not present

## 2019-10-20 DIAGNOSIS — S52302D Unspecified fracture of shaft of left radius, subsequent encounter for closed fracture with routine healing: Secondary | ICD-10-CM | POA: Diagnosis not present

## 2019-10-21 DIAGNOSIS — R630 Anorexia: Secondary | ICD-10-CM | POA: Diagnosis not present

## 2019-10-21 DIAGNOSIS — R634 Abnormal weight loss: Secondary | ICD-10-CM | POA: Diagnosis not present

## 2019-10-21 DIAGNOSIS — R1084 Generalized abdominal pain: Secondary | ICD-10-CM | POA: Diagnosis not present

## 2019-10-21 DIAGNOSIS — C220 Liver cell carcinoma: Secondary | ICD-10-CM | POA: Diagnosis not present

## 2019-10-21 DIAGNOSIS — K7460 Unspecified cirrhosis of liver: Secondary | ICD-10-CM | POA: Diagnosis not present

## 2019-10-21 DIAGNOSIS — R180 Malignant ascites: Secondary | ICD-10-CM | POA: Diagnosis not present

## 2019-10-23 DIAGNOSIS — K7460 Unspecified cirrhosis of liver: Secondary | ICD-10-CM | POA: Diagnosis not present

## 2019-10-23 DIAGNOSIS — R630 Anorexia: Secondary | ICD-10-CM | POA: Diagnosis not present

## 2019-10-23 DIAGNOSIS — R634 Abnormal weight loss: Secondary | ICD-10-CM | POA: Diagnosis not present

## 2019-10-23 DIAGNOSIS — C220 Liver cell carcinoma: Secondary | ICD-10-CM | POA: Diagnosis not present

## 2019-10-23 DIAGNOSIS — R180 Malignant ascites: Secondary | ICD-10-CM | POA: Diagnosis not present

## 2019-10-23 DIAGNOSIS — R1084 Generalized abdominal pain: Secondary | ICD-10-CM | POA: Diagnosis not present

## 2019-11-20 DEATH — deceased
